# Patient Record
Sex: Female | Born: 1960 | Race: White | Hispanic: No | Marital: Married | Smoking: Current every day smoker
Health system: Southern US, Community
[De-identification: ages and names within clinical notes are randomized; demographics above are authoritative.]

## PROBLEM LIST (undated history)

## (undated) DIAGNOSIS — F419 Anxiety disorder, unspecified: Secondary | ICD-10-CM

## (undated) DIAGNOSIS — I10 Essential (primary) hypertension: Secondary | ICD-10-CM

## (undated) DIAGNOSIS — F32A Depression, unspecified: Secondary | ICD-10-CM

## (undated) DIAGNOSIS — J189 Pneumonia, unspecified organism: Secondary | ICD-10-CM

## (undated) DIAGNOSIS — I219 Acute myocardial infarction, unspecified: Secondary | ICD-10-CM

## (undated) DIAGNOSIS — Z8719 Personal history of other diseases of the digestive system: Secondary | ICD-10-CM

## (undated) DIAGNOSIS — M1990 Unspecified osteoarthritis, unspecified site: Secondary | ICD-10-CM

## (undated) DIAGNOSIS — T7840XA Allergy, unspecified, initial encounter: Secondary | ICD-10-CM

## (undated) DIAGNOSIS — J45909 Unspecified asthma, uncomplicated: Secondary | ICD-10-CM

## (undated) DIAGNOSIS — Z87442 Personal history of urinary calculi: Secondary | ICD-10-CM

## (undated) DIAGNOSIS — Z8489 Family history of other specified conditions: Secondary | ICD-10-CM

## (undated) DIAGNOSIS — F329 Major depressive disorder, single episode, unspecified: Secondary | ICD-10-CM

## (undated) DIAGNOSIS — D649 Anemia, unspecified: Secondary | ICD-10-CM

## (undated) DIAGNOSIS — G459 Transient cerebral ischemic attack, unspecified: Secondary | ICD-10-CM

## (undated) DIAGNOSIS — K219 Gastro-esophageal reflux disease without esophagitis: Secondary | ICD-10-CM

## (undated) DIAGNOSIS — I7090 Unspecified atherosclerosis: Secondary | ICD-10-CM

## (undated) HISTORY — PX: HAMMER TOE SURGERY: SHX385

## (undated) HISTORY — DX: Major depressive disorder, single episode, unspecified: F32.9

## (undated) HISTORY — PX: CHOLECYSTECTOMY: SHX55

## (undated) HISTORY — PX: CERVICAL SPINE SURGERY: SHX589

## (undated) HISTORY — DX: Unspecified asthma, uncomplicated: J45.909

## (undated) HISTORY — PX: ABDOMINAL HYSTERECTOMY: SHX81

## (undated) HISTORY — DX: Gastro-esophageal reflux disease without esophagitis: K21.9

## (undated) HISTORY — DX: Anxiety disorder, unspecified: F41.9

## (undated) HISTORY — PX: OTHER SURGICAL HISTORY: SHX169

## (undated) HISTORY — DX: Depression, unspecified: F32.A

## (undated) HISTORY — DX: Essential (primary) hypertension: I10

## (undated) HISTORY — PX: CARPAL TUNNEL RELEASE: SHX101

## (undated) HISTORY — DX: Transient cerebral ischemic attack, unspecified: G45.9

## (undated) HISTORY — DX: Unspecified osteoarthritis, unspecified site: M19.90

## (undated) HISTORY — PX: APPENDECTOMY: SHX54

## (undated) HISTORY — PX: JOINT REPLACEMENT: SHX530

## (undated) HISTORY — DX: Allergy, unspecified, initial encounter: T78.40XA

---

## 2010-04-17 DIAGNOSIS — Z8249 Family history of ischemic heart disease and other diseases of the circulatory system: Secondary | ICD-10-CM | POA: Insufficient documentation

## 2010-04-17 DIAGNOSIS — Z823 Family history of stroke: Secondary | ICD-10-CM | POA: Insufficient documentation

## 2010-04-17 DIAGNOSIS — Z800 Family history of malignant neoplasm of digestive organs: Secondary | ICD-10-CM | POA: Insufficient documentation

## 2010-07-21 DIAGNOSIS — G459 Transient cerebral ischemic attack, unspecified: Secondary | ICD-10-CM

## 2010-07-21 HISTORY — DX: Transient cerebral ischemic attack, unspecified: G45.9

## 2010-08-16 DIAGNOSIS — E538 Deficiency of other specified B group vitamins: Secondary | ICD-10-CM | POA: Insufficient documentation

## 2011-04-03 DIAGNOSIS — F331 Major depressive disorder, recurrent, moderate: Secondary | ICD-10-CM | POA: Insufficient documentation

## 2011-08-18 DIAGNOSIS — I10 Essential (primary) hypertension: Secondary | ICD-10-CM | POA: Insufficient documentation

## 2011-08-18 DIAGNOSIS — F17200 Nicotine dependence, unspecified, uncomplicated: Secondary | ICD-10-CM | POA: Insufficient documentation

## 2013-03-22 DIAGNOSIS — G4700 Insomnia, unspecified: Secondary | ICD-10-CM | POA: Insufficient documentation

## 2013-04-20 DIAGNOSIS — M5416 Radiculopathy, lumbar region: Secondary | ICD-10-CM | POA: Insufficient documentation

## 2013-07-28 DIAGNOSIS — R911 Solitary pulmonary nodule: Secondary | ICD-10-CM | POA: Insufficient documentation

## 2013-07-28 DIAGNOSIS — J45909 Unspecified asthma, uncomplicated: Secondary | ICD-10-CM | POA: Insufficient documentation

## 2013-07-28 DIAGNOSIS — E041 Nontoxic single thyroid nodule: Secondary | ICD-10-CM | POA: Insufficient documentation

## 2014-04-03 DIAGNOSIS — Z9889 Other specified postprocedural states: Secondary | ICD-10-CM | POA: Insufficient documentation

## 2014-04-03 DIAGNOSIS — N3941 Urge incontinence: Secondary | ICD-10-CM | POA: Insufficient documentation

## 2014-04-03 DIAGNOSIS — Z90710 Acquired absence of both cervix and uterus: Secondary | ICD-10-CM | POA: Insufficient documentation

## 2014-04-05 DIAGNOSIS — R7301 Impaired fasting glucose: Secondary | ICD-10-CM | POA: Insufficient documentation

## 2014-04-25 DIAGNOSIS — N200 Calculus of kidney: Secondary | ICD-10-CM | POA: Insufficient documentation

## 2014-07-26 DIAGNOSIS — H6983 Other specified disorders of Eustachian tube, bilateral: Secondary | ICD-10-CM | POA: Insufficient documentation

## 2015-10-24 DIAGNOSIS — G894 Chronic pain syndrome: Secondary | ICD-10-CM | POA: Insufficient documentation

## 2016-07-28 DIAGNOSIS — Z6841 Body Mass Index (BMI) 40.0 and over, adult: Secondary | ICD-10-CM

## 2016-08-18 DIAGNOSIS — M50122 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 level with radiculopathy: Secondary | ICD-10-CM | POA: Insufficient documentation

## 2016-11-17 DIAGNOSIS — I872 Venous insufficiency (chronic) (peripheral): Secondary | ICD-10-CM | POA: Insufficient documentation

## 2018-07-01 ENCOUNTER — Encounter: Payer: Self-pay | Admitting: Family Medicine

## 2018-07-01 ENCOUNTER — Ambulatory Visit: Payer: 59 | Admitting: Family Medicine

## 2018-07-01 VITALS — BP 117/72 | HR 69 | Temp 96.9°F | Ht 66.0 in | Wt 233.0 lb

## 2018-07-01 DIAGNOSIS — J359 Chronic disease of tonsils and adenoids, unspecified: Secondary | ICD-10-CM | POA: Diagnosis not present

## 2018-07-01 DIAGNOSIS — Z7689 Persons encountering health services in other specified circumstances: Secondary | ICD-10-CM

## 2018-07-01 DIAGNOSIS — E041 Nontoxic single thyroid nodule: Secondary | ICD-10-CM | POA: Diagnosis not present

## 2018-07-01 DIAGNOSIS — M51379 Other intervertebral disc degeneration, lumbosacral region without mention of lumbar back pain or lower extremity pain: Secondary | ICD-10-CM | POA: Insufficient documentation

## 2018-07-01 DIAGNOSIS — Z23 Encounter for immunization: Secondary | ICD-10-CM | POA: Diagnosis not present

## 2018-07-01 DIAGNOSIS — F17200 Nicotine dependence, unspecified, uncomplicated: Secondary | ICD-10-CM

## 2018-07-01 DIAGNOSIS — I10 Essential (primary) hypertension: Secondary | ICD-10-CM | POA: Diagnosis not present

## 2018-07-01 DIAGNOSIS — M5137 Other intervertebral disc degeneration, lumbosacral region: Secondary | ICD-10-CM | POA: Insufficient documentation

## 2018-07-01 DIAGNOSIS — M549 Dorsalgia, unspecified: Secondary | ICD-10-CM | POA: Insufficient documentation

## 2018-07-01 DIAGNOSIS — Z1239 Encounter for other screening for malignant neoplasm of breast: Secondary | ICD-10-CM

## 2018-07-01 MED ORDER — ALBUTEROL SULFATE HFA 108 (90 BASE) MCG/ACT IN AERS: 2.0000 | INHALATION_SPRAY | Freq: Four times a day (QID) | RESPIRATORY_TRACT | 0 refills | Status: DC | PRN

## 2018-07-01 MED ORDER — METOPROLOL SUCCINATE ER 25 MG PO TB24: 25.0000 mg | ORAL_TABLET | Freq: Every day | ORAL | 3 refills | Status: DC

## 2018-07-01 MED ORDER — AMLODIPINE BESYLATE 5 MG PO TABS: 5.0000 mg | ORAL_TABLET | Freq: Every day | ORAL | 3 refills | Status: DC

## 2018-07-01 NOTE — Progress Notes (Signed)
Subjective: ZO:[id number] care, tonsil concern HPI: Sydney [name] is a 57 y.o. female presenting to clinic today for:  1. Tonsil concern Patient reports that she noticed a spot on her right tonsil in September.  There was a smaller spot next to as well.  She denies any sore throat, fevers, chills, unplanned weight loss, lymph node enlargement.  She is an active every day smoker and has been a smoker of 1/2 pack/day for about 15 years total.  She was successfully in remission from smoking up until her father passed and her daughter got sick with cancer.  2.  Hypertension Longstanding history of hypertension.  She reports compliance with Norvasc and Toprol.  She needs refills on both of these medications.  No chest pain, shortness of breath, lower extreme edema, dizziness or visual disturbance.  She is a smoker as above.  3.  Preventative care Patient with history of total hysterectomy in her 30s.  She has been undergoing close surveillance with every 2-year mammograms but was too ill to have her repeat mammogram done last year.  She would like to go to [place] to have this done.  Past Medical History:  Diagnosis Date  . Allergy   . Anxiety   . Arthritis   . Asthma   . Depression   . GERD (gastroesophageal reflux disease)   . Hypertension   . Stroke [facility]) 2012   TIA   History reviewed. No pertinent surgical history. Social History   Socioeconomic History  . Marital status: Married    Spouse name: Not on file  . Number of children: 3  . Years of education: Not on file  . Highest education level: Not on file  Occupational History  . Not on file  Social Needs  . Financial resource strain: Not on file  . Food insecurity:    Worry: Not on file    Inability: Not on file  . Transportation needs:    Medical: Not on file    Non-medical: Not on file  Tobacco Use  . Smoking status: Current Every Day Smoker    Packs/day: 0.50    Years: 15.00    Pack years: 7.50    Types:  Cigarettes  . Smokeless tobacco: Never Used  Substance and Sexual Activity  . Alcohol use: Not on file    Comment: occ  . Drug use: Never  . Sexual activity: Not on file  Lifestyle  . Physical activity:    Days per week: Not on file    Minutes per session: Not on file  . Stress: Not on file  Relationships  . Social connections:    Talks on phone: Not on file    Gets together: Not on file    Attends religious service: Not on file    Active member of club or organization: Not on file    Attends meetings of clubs or organizations: Not on file    Relationship status: Not on file  . Intimate partner violence:    Fear of current or ex partner: Not on file    Emotionally abused: Not on file    Physically abused: Not on file    Forced sexual activity: Not on file  Other Topics Concern  . Not on file  Social History Narrative   Recently relocated to [place] from [place].  She resides at home with her husband.   She has 3 children but 1 passed away from cancer.   Current Meds  Medication Sig  .  albuterol (PROAIR HFA) 108 (90 Base) MCG/ACT inhaler Inhale 2 puffs into the lungs every 6 (six) hours as needed for wheezing or shortness of breath.  Marland Kitchen. amLODipine (NORVASC) 5 MG tablet Take 1 tablet (5 mg total) by mouth daily.  . Diclofenac Sodium (PENNSAID) 2 % SOLN APPLY 2 PUMPS (2 GRAMS) TO AFFECTED AREA TOPICALLY TWICE DAILY AS DIRECTED  . fexofenadine (ALLEGRA) 60 MG tablet Take by mouth.  Marland Kitchen. HYDROcodone-acetaminophen (NORCO) 5-325 MG tablet Norco 5 mg-325 mg tablet  Take 1 tablet twice a day by oral route.  . metoprolol succinate (TOPROL-XL) 25 MG 24 hr tablet Take 1 tablet (25 mg total) by mouth daily.  . sertraline (ZOLOFT) 100 MG tablet sertraline 100 mg tablet  . traZODone (DESYREL) 100 MG tablet trazodone 100 mg tablet  . vitamin B-12 (CYANOCOBALAMIN) 1000 MCG tablet Take by mouth.  . [DISCONTINUED] albuterol (PROAIR HFA) 108 (90 Base) MCG/ACT inhaler ProAir HFA 90  mcg/actuation aerosol inhaler  TAKE 2 PUFFS BY MOUTH EVERY 6 HOURS AS NEEDED FOR WHEEZE  . [DISCONTINUED] amLODipine (NORVASC) 5 MG tablet amlodipine 5 mg tablet  TAKE 1 TABLET BY MOUTH EVERY DAY IN THE MORNING  . [DISCONTINUED] metoprolol succinate (TOPROL-XL) 25 MG 24 hr tablet metoprolol succinate ER 25 mg tablet,extended release 24 hr  TAKE 1 TABLET BY MOUTH EVERY DAY   Family History  Problem Relation Age of Onset  . Anxiety disorder Mother   . Depression Mother   . Heart disease Mother   . Hypertension Mother   . Arrhythmia Mother   . Cancer Father   . Lung cancer Father   . Migraines Sister   . Alcohol abuse Brother   . Cancer Daughter        nerve sheath sarcoma   Allergies  Allergen Reactions  . Bee Pollen Anaphylaxis and Swelling  . Butorphanol Other (See Comments)    Not sure told by md she was allergic after surgery.   . Meloxicam Anxiety    Other reaction(s): Other (See Comments) Mood disorder Altered her personality   . Penicillins Anaphylaxis and Hives    Other reaction(s): Unable to Recall As child mom was told she is highly allergic   . Prochlorperazine Edisylate Anaphylaxis  . Sulfa Antibiotics Anaphylaxis    Other reaction(s): Unable to Recall  . Tramadol Anxiety    Didn't like the way it made her feel   . Topiramate Nausea And Vomiting     Health Maintenance: Flu shot, mammo. ROS: Per HPI  Objective: Office vital signs reviewed. BP 117/72   Pulse 69   Temp (!) 96.9 F (36.1 C) (Oral)   Ht 5\' 6"  (1.676 m)   Wt 233 lb (105.7 kg)   BMI 37.61 kg/m   Physical Examination:  General: Awake, alert, well nourished, No acute distress HEENT: Normal    Neck: No masses palpated. No lymphadenopathy    Ears: Tympanic membranes intact, normal light reflex, no erythema, no bulging    Eyes: PERRLA, extraocular movement in tact, sclera white    Nose: nasal turbinates moist, no nasal discharge    Throat: moist mucus membranes, no erythema, RIGHT  tonsil w/ 1-2 millimeter yellow, mucinous appearing mass.  There is a smaller adjacent mass of similar color and texture.  Airway is patent Cardio: regular rate and rhythm, S1S2 heard, no murmurs appreciated Pulm: clear to auscultation bilaterally, no wheezes, rhonchi or rales; normal work of breathing on room air  Assessment/ Plan: 57 y.o. female    1.  Lesion of tonsil Given several month history of tonsillar lesion and current smoking status, I have placed a referral to ear nose and throat for further evaluation.  On exam it does not appear to be infectious and has a mucinous appearance.   - Ambulatory referral to ENT  2. Essential hypertension Controlled.  Refills sent to pharmacy.  3. Establishing care with new doctor, encounter for Records reviewed.  Plan for physical w/ fasting labs in 09/2018  4. Nontoxic uninodular goiter Hx biopsy that was normal per patient  5. Screening for breast cancer Wants at [facility] - MM Digital Screening; Future  6. Tobacco use disorder Counseling performed.  Contemplative but not ready for cessation.   Meds ordered this encounter  Medications  . albuterol (PROAIR HFA) 108 (90 Base) MCG/ACT inhaler    Sig: Inhale 2 puffs into the lungs every 6 (six) hours as needed for wheezing or shortness of breath.    Dispense:  3 Inhaler    Refill:  0  . amLODipine (NORVASC) 5 MG tablet    Sig: Take 1 tablet (5 mg total) by mouth daily.    Dispense:  90 tablet    Refill:  3  . metoprolol succinate (TOPROL-XL) 25 MG 24 hr tablet    Sig: Take 1 tablet (25 mg total) by mouth daily.    Dispense:  90 tablet    Refill:  3   Orders Placed This Encounter  Procedures  . MM Digital Screening    Standing Status:   Future    Standing Expiration Date:   09/02/2019    Order Specific Question:   Reason for Exam (SYMPTOM  OR DIAGNOSIS REQUIRED)    Answer:   screening for breast cancer    Order Specific Question:   Is the patient pregnant?    Answer:   No    Order  Specific Question:   Preferred imaging location?    Answer:   [facility] [place]  . Ambulatory referral to ENT    Referral Priority:   Routine    Referral Type:   Consultation    Referral Reason:   Specialty Services Required    Requested Specialty:   Otolaryngology    Number of Visits Requested:   1    Bueford Arp Hulen Skains, DO [facility] [phone]

## 2018-08-02 DIAGNOSIS — Z029 Encounter for administrative examinations, unspecified: Secondary | ICD-10-CM

## 2018-08-04 ENCOUNTER — Other Ambulatory Visit: Payer: Self-pay | Admitting: Family Medicine

## 2018-08-04 DIAGNOSIS — Z6841 Body Mass Index (BMI) 40.0 and over, adult: Principal | ICD-10-CM

## 2018-08-04 DIAGNOSIS — R7301 Impaired fasting glucose: Secondary | ICD-10-CM

## 2018-08-04 NOTE — Progress Notes (Signed)
I was completing forms for patient for interactive health screening form.  She requires a blood sugar for completion.  I informed her this via telephone and she will come in tomorrow to have this checked.  Order has been placed.  Will give form to [place].  Ashly M. Nadine Counts, DO [facility]   Orders Placed This Encounter  Procedures  . Glucose Hemocue Waived

## 2018-08-05 ENCOUNTER — Ambulatory Visit (INDEPENDENT_AMBULATORY_CARE_PROVIDER_SITE_OTHER): Payer: 59 | Admitting: Otolaryngology

## 2018-08-05 DIAGNOSIS — J351 Hypertrophy of tonsils: Secondary | ICD-10-CM

## 2018-10-01 ENCOUNTER — Other Ambulatory Visit: Payer: Self-pay

## 2018-10-01 ENCOUNTER — Ambulatory Visit (INDEPENDENT_AMBULATORY_CARE_PROVIDER_SITE_OTHER): Payer: 59 | Admitting: Family Medicine

## 2018-10-01 ENCOUNTER — Encounter: Payer: Self-pay | Admitting: Family Medicine

## 2018-10-01 VITALS — BP 125/78 | HR 67 | Temp 98.7°F | Ht 66.0 in | Wt 227.0 lb

## 2018-10-01 DIAGNOSIS — Z114 Encounter for screening for human immunodeficiency virus [HIV]: Secondary | ICD-10-CM

## 2018-10-01 DIAGNOSIS — Z0001 Encounter for general adult medical examination with abnormal findings: Secondary | ICD-10-CM

## 2018-10-01 DIAGNOSIS — E041 Nontoxic single thyroid nodule: Secondary | ICD-10-CM

## 2018-10-01 DIAGNOSIS — Z0000 Encounter for general adult medical examination without abnormal findings: Secondary | ICD-10-CM

## 2018-10-01 DIAGNOSIS — Z1159 Encounter for screening for other viral diseases: Secondary | ICD-10-CM

## 2018-10-01 DIAGNOSIS — Z6841 Body Mass Index (BMI) 40.0 and over, adult: Secondary | ICD-10-CM

## 2018-10-01 DIAGNOSIS — I10 Essential (primary) hypertension: Secondary | ICD-10-CM | POA: Diagnosis not present

## 2018-10-01 DIAGNOSIS — Z130 Encounter for screening for diseases of the blood and blood-forming organs and certain disorders involving the immune mechanism: Secondary | ICD-10-CM

## 2018-10-01 DIAGNOSIS — R7301 Impaired fasting glucose: Secondary | ICD-10-CM | POA: Diagnosis not present

## 2018-10-01 DIAGNOSIS — L819 Disorder of pigmentation, unspecified: Secondary | ICD-10-CM

## 2018-10-01 LAB — BAYER DCA HB A1C WAIVED: HB A1C (BAYER DCA - WAIVED): 5.5 % (ref <7.0)

## 2018-10-01 NOTE — Progress Notes (Signed)
Sydney [name] is a 58 y.o. female presents to office today for annual physical exam examination.    Concerns today include: 1.  None.  She notes that she is doing well Marital status: Married, Substance use: Daily smoker Diet: Fair, Exercise: No structured secondary to orthopedic issues Last colonoscopy: 4 years ago.  She is due next year.  She gets these every 5 years due to family history of colon cancer.  She has had benign polyps on previous colonoscopy. Last mammogram: Needs.  Was not contacted by Linna Hoff for mammogram Last pap smear: History of total hysterectomy.  No vaginal complaints today Refills needed today: None needed Immunizations needed: UTD  Past Medical History:  Diagnosis Date  . Allergy   . Anxiety   . Arthritis   . Asthma   . Depression   . GERD (gastroesophageal reflux disease)   . Hypertension   . TIA (transient ischemic attack) 2012   Social History   Socioeconomic History  . Marital status: Married    Spouse name: Not on file  . Number of children: 3  . Years of education: Not on file  . Highest education level: Not on file  Occupational History  . Not on file  Social Needs  . Financial resource strain: Not on file  . Food insecurity:    Worry: Not on file    Inability: Not on file  . Transportation needs:    Medical: Not on file    Non-medical: Not on file  Tobacco Use  . Smoking status: Current Every Day Smoker    Packs/day: 0.50    Years: 15.00    Pack years: 7.50    Types: Cigarettes  . Smokeless tobacco: Never Used  Substance and Sexual Activity  . Alcohol use: Yes    Comment: occ  . Drug use: Never  . Sexual activity: Not Currently  Lifestyle  . Physical activity:    Days per week: Not on file    Minutes per session: Not on file  . Stress: Not on file  Relationships  . Social connections:    Talks on phone: Not on file    Gets together: Not on file    Attends religious service: Not on file    Active member of club  or organization: Not on file    Attends meetings of clubs or organizations: Not on file    Relationship status: Not on file  . Intimate partner violence:    Fear of current or ex partner: Not on file    Emotionally abused: Not on file    Physically abused: Not on file    Forced sexual activity: Not on file  Other Topics Concern  . Not on file  Social History Narrative   Recently relocated to [place] from [place].  She resides at home with her husband.   She has 3 children but 1 passed away from cancer.   No past surgical history on file. Family History  Problem Relation Age of Onset  . Anxiety disorder Mother   . Depression Mother   . Heart disease Mother   . Hypertension Mother   . Arrhythmia Mother   . Cancer Father   . Lung cancer Father   . Migraines Sister   . Alcohol abuse Brother   . Cancer Daughter        nerve sheath sarcoma    Current Outpatient Medications:  .  albuterol (PROAIR HFA) 108 (90 Base) MCG/ACT inhaler, Inhale 2 puffs  into the lungs every 6 (six) hours as needed for wheezing or shortness of breath., Disp: 3 Inhaler, Rfl: 0 .  amLODipine (NORVASC) 5 MG tablet, Take 1 tablet (5 mg total) by mouth daily., Disp: 90 tablet, Rfl: 3 .  fexofenadine (ALLEGRA) 60 MG tablet, Take by mouth., Disp: , Rfl:  .  HYDROcodone-acetaminophen (NORCO) 5-325 MG tablet, Norco 5 mg-325 mg tablet  Take 1 tablet twice a day by oral route., Disp: , Rfl:  .  metoprolol succinate (TOPROL-XL) 25 MG 24 hr tablet, Take 1 tablet (25 mg total) by mouth daily., Disp: 90 tablet, Rfl: 3 .  sertraline (ZOLOFT) 100 MG tablet, sertraline 100 mg tablet, Disp: , Rfl:  .  traZODone (DESYREL) 100 MG tablet, trazodone 100 mg tablet, Disp: , Rfl:  .  vitamin B-12 (CYANOCOBALAMIN) 1000 MCG tablet, Take by mouth., Disp: , Rfl:   Allergies  Allergen Reactions  . Bee Pollen Anaphylaxis and Swelling  . Butorphanol Other (See Comments)    Not sure told by md she was allergic after surgery.   .  Meloxicam Anxiety    Other reaction(s): Other (See Comments) Mood disorder Altered her personality   . Penicillins Anaphylaxis and Hives    Other reaction(s): Unable to Recall As child mom was told she is highly allergic   . Prochlorperazine Edisylate Anaphylaxis  . Sulfa Antibiotics Anaphylaxis    Other reaction(s): Unable to Recall  . Tramadol Anxiety    Didn't like the way it made her feel   . Topiramate Nausea And Vomiting     ROS: Review of Systems Constitutional: negative Eyes: positive for contacts/glasses Ears, nose, mouth, throat, and face: negative Respiratory: negative Cardiovascular: negative Gastrointestinal: negative Genitourinary:negative Integument/breast: negative Hematologic/lymphatic: negative Musculoskeletal:positive for back pain Neurological: negative Behavioral/Psych: negative Endocrine: negative Allergic/Immunologic: negative    Physical exam BP 125/78   Pulse 67   Temp 98.7 F (37.1 C) (Oral)   Ht _0  (1.676 m)   Wt 227 lb (103 kg)   BMI 36.64 kg/m  General appearance: alert, cooperative, appears stated age, no distress and moderately obese Head: Normocephalic, without obvious abnormality, atraumatic Eyes: negative findings: lids and lashes normal, conjunctivae and sclerae normal, corneas clear and pupils equal, round, reactive to light and accomodation Ears: normal TM's and external ear canals both ears Nose: Nares normal. Septum midline. Mucosa normal. No drainage or sinus tenderness. Throat: dentition fair. MMM, no oral lesions noted Neck: no adenopathy, supple, symmetrical, trachea midline and thyroid not enlarged, symmetric, no tenderness/mass/nodules Back: symmetric, no curvature. ROM normal. No CVA tenderness. Lungs: clear to auscultation bilaterally Heart: regular rate and rhythm, S1, S2 normal, no murmur, click, rub or gallop Abdomen: soft, non-tender; bowel sounds normal; no masses,  no organomegaly Extremities: extremities  normal, atraumatic, no cyanosis or edema Pulses: 2+ and symmetric Skin: Multiple pigmented nevi.  She has 2 nevi of concern along bilateral anterior shins.  One is somewhat cystic in nature, on the left lower extremity.  One is highly pigmented on the right lower extremity.She has a well healed horizontal surgical scar along the right anterior neck. Multiple tattoos. Lymph nodes: Cervical, supraclavicular, and axillary nodes normal. Neurologic: Alert and oriented X 3, normal strength and tone. Normal symmetric reflexes. Normal coordination and gait Psych: Mood stable, speech normal, affect appropriate, pleasant and interactive. Depression screen [facility] 2/9 10/01/2018 07/01/2018  Decreased Interest 0 0  Down, Depressed, Hopeless 0 0  PHQ - 2 Score 0 0  Altered sleeping 0 0  Tired,  decreased energy 0 0  Change in appetite 0 1  Feeling bad or failure about yourself  0 1  Trouble concentrating 0 0  Moving slowly or fidgety/restless 0 1  Suicidal thoughts 0 0  PHQ-9 Score 0 3  Difficult doing work/chores - Not difficult at all   Assessment/ Plan: Sydney [name] here for annual physical exam.   1. Annual physical exam I will reach out to our scheduler with regards to her mammogram, which was ordered in December.  Colonoscopy results were found in the EMR and have been printed for scanning.  Not due until October of this year.  2. Essential hypertension Controlled.  No changes made.  No refills needed.  Check fasting lipid panel and metabolic panel - [facility] - Lipid Panel  3. Impaired fasting glucose Noted previously.  Check A1c - Bayer DCA Hb A1c Waived  4. Morbid obesity with BMI of 40.0-44.9, adult ([place]) Working on diet modification.  Unfortunately, she is limited physically secondary to chronic back pain.  5. Nontoxic uninodular goiter Asymptomatic.  Check TSH - TSH  6. Screening, anemia, deficiency, iron - CBC  7. Screening for HIV (human immunodeficiency virus) - HIV  antibody (with reflex)  8. Encounter for hepatitis C screening test for low risk patient  - Hepatitis C antibody  9. Pigmented skin lesion of uncertain nature Specifically right anterior shin with a highly pigmented skin lesion.  Given her time in the sun, I think she warrants full body exam.  Referral to dermatology placed. - Ambulatory referral to Dermatology   Counseled on healthy lifestyle choices, including diet (rich in fruits, vegetables and lean meats and low in salt and simple carbohydrates) and exercise (at least 30 minutes of moderate physical activity daily).  Patient to follow up in 1 year for annual exam or sooner if needed.  Zariah Jost M. Lajuana Ripple, DO

## 2018-10-01 NOTE — Patient Instructions (Signed)
I will see if Sydney [name] can reach out to the mammogram center in [place].  Health Maintenance, Female Adopting a healthy lifestyle and getting preventive care can go a long way to promote health and wellness. Talk with your health care provider about what schedule of regular examinations is right for you. This is a good chance for you to check in with your provider about disease prevention and staying healthy. In between checkups, there are plenty of things you can do on your own. Experts have done a lot of research about which lifestyle changes and preventive measures are most likely to keep you healthy. Ask your health care provider for more information. Weight and diet Eat a healthy diet  Be sure to include plenty of vegetables, fruits, low-fat dairy products, and lean protein.  Do not eat a lot of foods high in solid fats, added sugars, or salt.  Get regular exercise. This is one of the most important things you can do for your health. ? Most adults should exercise for at least 150 minutes each week. The exercise should increase your heart rate and make you sweat (moderate-intensity exercise). ? Most adults should also do strengthening exercises at least twice a week. This is in addition to the moderate-intensity exercise. Maintain a healthy weight  Body mass index (BMI) is a measurement that can be used to identify possible weight problems. It estimates body fat based on height and weight. Your health care provider can help determine your BMI and help you achieve or maintain a healthy weight.  For females 59 years of age and older: ? A BMI below 18.5 is considered underweight. ? A BMI of 18.5 to 24.9 is normal. ? A BMI of 25 to 29.9 is considered overweight. ? A BMI of 30 and above is considered obese. Watch levels of cholesterol and blood lipids  You should start having your blood tested for lipids and cholesterol at 59 years of age, then have this test every 5 years.  You may need  to have your cholesterol levels checked more often if: ? Your lipid or cholesterol levels are high. ? You are older than 59 years of age. ? You are at high risk for heart disease. Cancer screening Lung Cancer  Lung cancer screening is recommended for adults 29-30 years old who are at high risk for lung cancer because of a history of smoking.  A yearly low-dose CT scan of the lungs is recommended for people who: ? Currently smoke. ? Have quit within the past 15 years. ? Have at least a 30-pack-year history of smoking. A pack year is smoking an average of one pack of cigarettes a day for 1 year.  Yearly screening should continue until it has been 15 years since you quit.  Yearly screening should stop if you develop a health problem that would prevent you from having lung cancer treatment. Breast Cancer  Practice breast self-awareness. This means understanding how your breasts normally appear and feel.  It also means doing regular breast self-exams. Let your health care provider know about any changes, no matter how small.  If you are in your 20s or 30s, you should have a clinical breast exam (CBE) by a health care provider every 1-3 years as part of a regular health exam.  If you are 62 or older, have a CBE every year. Also consider having a breast X-ray (mammogram) every year.  If you have a family history of breast cancer, talk to your health care  provider about genetic screening.  If you are at high risk for breast cancer, talk to your health care provider about having an MRI and a mammogram every year.  Breast cancer gene (BRCA) assessment is recommended for women who have family members with BRCA-related cancers. BRCA-related cancers include: ? Breast. ? Ovarian. ? Tubal. ? Peritoneal cancers.  Results of the assessment will determine the need for genetic counseling and BRCA1 and BRCA2 testing. Cervical Cancer Your health care provider may recommend that you be screened  regularly for cancer of the pelvic organs (ovaries, uterus, and vagina). This screening involves a pelvic examination, including checking for microscopic changes to the surface of your cervix (Pap test). You may be encouraged to have this screening done every 3 years, beginning at age 94.  For women ages 60-65, health care providers may recommend pelvic exams and Pap testing every 3 years, or they may recommend the Pap and pelvic exam, combined with testing for human papilloma virus (HPV), every 5 years. Some types of HPV increase your risk of cervical cancer. Testing for HPV may also be done on women of any age with unclear Pap test results.  Other health care providers may not recommend any screening for nonpregnant women who are considered low risk for pelvic cancer and who do not have symptoms. Ask your health care provider if a screening pelvic exam is right for you.  If you have had past treatment for cervical cancer or a condition that could lead to cancer, you need Pap tests and screening for cancer for at least 20 years after your treatment. If Pap tests have been discontinued, your risk factors (such as having a new sexual partner) need to be reassessed to determine if screening should resume. Some women have medical problems that increase the chance of getting cervical cancer. In these cases, your health care provider may recommend more frequent screening and Pap tests. Colorectal Cancer  This type of cancer can be detected and often prevented.  Routine colorectal cancer screening usually begins at 59 years of age and continues through 59 years of age.  Your health care provider may recommend screening at an earlier age if you have risk factors for colon cancer.  Your health care provider may also recommend using home test kits to check for hidden blood in the stool.  A small camera at the end of a tube can be used to examine your colon directly (sigmoidoscopy or colonoscopy). This is  done to check for the earliest forms of colorectal cancer.  Routine screening usually begins at age 71.  Direct examination of the colon should be repeated every 5-10 years through 59 years of age. However, you may need to be screened more often if early forms of precancerous polyps or small growths are found. Skin Cancer  Check your skin from head to toe regularly.  Tell your health care provider about any new moles or changes in moles, especially if there is a change in a mole's shape or color.  Also tell your health care provider if you have a mole that is larger than the size of a pencil eraser.  Always use sunscreen. Apply sunscreen liberally and repeatedly throughout the day.  Protect yourself by wearing long sleeves, pants, a wide-brimmed hat, and sunglasses whenever you are outside. Heart disease, diabetes, and high blood pressure  High blood pressure causes heart disease and increases the risk of stroke. High blood pressure is more likely to develop in: ? People who have  blood pressure in the high end of the normal range (130-139/85-89 mm Hg). ? People who are overweight or obese. ? People who are African American.  If you are 18-39 years of age, have your blood pressure checked every 3-5 years. If you are 40 years of age or older, have your blood pressure checked every year. You should have your blood pressure measured twice-once when you are at a hospital or clinic, and once when you are not at a hospital or clinic. Record the average of the two measurements. To check your blood pressure when you are not at a hospital or clinic, you can use: ? An automated blood pressure machine at a pharmacy. ? A home blood pressure monitor.  If you are between 55 years and 79 years old, ask your health care provider if you should take aspirin to prevent strokes.  Have regular diabetes screenings. This involves taking a blood sample to check your fasting blood sugar level. ? If you are at a  normal weight and have a low risk for diabetes, have this test once every three years after 59 years of age. ? If you are overweight and have a high risk for diabetes, consider being tested at a younger age or more often. Preventing infection Hepatitis B  If you have a higher risk for hepatitis B, you should be screened for this virus. You are considered at high risk for hepatitis B if: ? You were born in a country where hepatitis B is common. Ask your health care provider which countries are considered high risk. ? Your parents were born in a high-risk country, and you have not been immunized against hepatitis B (hepatitis B vaccine). ? You have HIV or AIDS. ? You use needles to inject street drugs. ? You live with someone who has hepatitis B. ? You have had sex with someone who has hepatitis B. ? You get hemodialysis treatment. ? You take certain medicines for conditions, including cancer, organ transplantation, and autoimmune conditions. Hepatitis C  Blood testing is recommended for: ? Everyone born from 1945 through 1965. ? Anyone with known risk factors for hepatitis C. Sexually transmitted infections (STIs)  You should be screened for sexually transmitted infections (STIs) including gonorrhea and chlamydia if: ? You are sexually active and are younger than 59 years of age. ? You are older than 59 years of age and your health care provider tells you that you are at risk for this type of infection. ? Your sexual activity has changed since you were last screened and you are at an increased risk for chlamydia or gonorrhea. Ask your health care provider if you are at risk.  If you do not have HIV, but are at risk, it may be recommended that you take a prescription medicine daily to prevent HIV infection. This is called pre-exposure prophylaxis (PrEP). You are considered at risk if: ? You are sexually active and do not regularly use condoms or know the HIV status of your partner(s). ? You  take drugs by injection. ? You are sexually active with a partner who has HIV. Talk with your health care provider about whether you are at high risk of being infected with HIV. If you choose to begin PrEP, you should first be tested for HIV. You should then be tested every 3 months for as long as you are taking PrEP. Pregnancy  If you are premenopausal and you may become pregnant, ask your health care provider about preconception counseling.    If you may become pregnant, take 400 to 800 micrograms (mcg) of folic acid every day.  If you want to prevent pregnancy, talk to your health care provider about birth control (contraception). Osteoporosis and menopause  Osteoporosis is a disease in which the bones lose minerals and strength with aging. This can result in serious bone fractures. Your risk for osteoporosis can be identified using a bone density scan.  If you are 65 years of age or older, or if you are at risk for osteoporosis and fractures, ask your health care provider if you should be screened.  Ask your health care provider whether you should take a calcium or vitamin D supplement to lower your risk for osteoporosis.  Menopause may have certain physical symptoms and risks.  Hormone replacement therapy may reduce some of these symptoms and risks. Talk to your health care provider about whether hormone replacement therapy is right for you. Follow these instructions at home:  Schedule regular health, dental, and eye exams.  Stay current with your immunizations.  Do not use any tobacco products including cigarettes, chewing tobacco, or electronic cigarettes.  If you are pregnant, do not drink alcohol.  If you are breastfeeding, limit how much and how often you drink alcohol.  Limit alcohol intake to no more than 1 drink per day for nonpregnant women. One drink equals 12 ounces of beer, 5 ounces of wine, or 1 ounces of hard liquor.  Do not use street drugs.  Do not share  needles.  Ask your health care provider for help if you need support or information about quitting drugs.  Tell your health care provider if you often feel depressed.  Tell your health care provider if you have ever been abused or do not feel safe at home. This information is not intended to replace advice given to you by your health care provider. Make sure you discuss any questions you have with your health care provider. Document Released: 01/20/2011 Document Revised: 12/13/2015 Document Reviewed: 04/10/2015 Elsevier Interactive Patient Education  2019 [employer]  

## 2018-10-02 LAB — CBC
Hematocrit: 47.9 % — ABNORMAL HIGH (ref 34.0–46.6)
Hemoglobin: 15.4 g/dL (ref 11.1–15.9)
MCH: 28.9 pg (ref 26.6–33.0)
MCHC: 32.2 g/dL (ref 31.5–35.7)
MCV: 90 fL (ref 79–97)
Platelets: 275 10*3/uL (ref 150–450)
RBC: 5.33 x10E6/uL — ABNORMAL HIGH (ref 3.77–5.28)
RDW: 13.5 % (ref 11.7–15.4)
WBC: 6.1 10*3/uL (ref 3.4–10.8)

## 2018-10-02 LAB — LIPID PANEL
CHOLESTEROL TOTAL: 171 mg/dL (ref 100–199)
Chol/HDL Ratio: 3.2 ratio (ref 0.0–4.4)
HDL: 53 mg/dL (ref >39)
LDL Calculated: 106 mg/dL — ABNORMAL HIGH (ref 0–99)
TRIGLYCERIDES: 62 mg/dL (ref 0–149)
VLDL Cholesterol Cal: 12 mg/dL (ref 5–40)

## 2018-10-02 LAB — HEPATITIS C ANTIBODY: Hep C Virus Ab: <0.1 s/co ratio (ref 0.0–0.9)

## 2018-10-02 LAB — CMP14+EGFR
ALK PHOS: 103 IU/L (ref 39–117)
ALT: 11 IU/L (ref 0–32)
AST: 13 IU/L (ref 0–40)
Albumin/Globulin Ratio: 2 (ref 1.2–2.2)
Albumin: 4.1 g/dL (ref 3.8–4.9)
BUN/Creatinine Ratio: 14 (ref 9–23)
BUN: 9 mg/dL (ref 6–24)
Bilirubin Total: 0.5 mg/dL (ref 0.0–1.2)
CO2: 21 mmol/L (ref 20–29)
Calcium: 9.1 mg/dL (ref 8.7–10.2)
Chloride: 103 mmol/L (ref 96–106)
Creatinine, Ser: 0.66 mg/dL (ref 0.57–1.00)
GFR calc Af Amer: 112 mL/min/{1.73_m2} (ref >59)
GFR calc non Af Amer: 97 mL/min/{1.73_m2} (ref >59)
Globulin, Total: 2.1 g/dL (ref 1.5–4.5)
Glucose: 93 mg/dL (ref 65–99)
Potassium: 4.8 mmol/L (ref 3.5–5.2)
Sodium: 140 mmol/L (ref 134–144)
Total Protein: 6.2 g/dL (ref 6.0–8.5)

## 2018-10-02 LAB — TSH: TSH: 1.07 u[IU]/mL (ref 0.450–4.500)

## 2018-10-02 LAB — HIV ANTIBODY (ROUTINE TESTING W REFLEX): HIV Screen 4th Generation wRfx: NONREACTIVE

## 2019-02-25 ENCOUNTER — Other Ambulatory Visit: Payer: Self-pay

## 2019-02-25 ENCOUNTER — Ambulatory Visit: Payer: 59

## 2019-04-15 ENCOUNTER — Ambulatory Visit (INDEPENDENT_AMBULATORY_CARE_PROVIDER_SITE_OTHER): Payer: 59

## 2019-04-15 ENCOUNTER — Encounter: Payer: Self-pay | Admitting: Family Medicine

## 2019-04-15 ENCOUNTER — Ambulatory Visit (INDEPENDENT_AMBULATORY_CARE_PROVIDER_SITE_OTHER): Payer: 59 | Admitting: Family Medicine

## 2019-04-15 VITALS — BP 129/80 | HR 75 | Temp 98.0°F | Ht 66.0 in | Wt 231.0 lb

## 2019-04-15 DIAGNOSIS — Z23 Encounter for immunization: Secondary | ICD-10-CM

## 2019-04-15 DIAGNOSIS — R1030 Lower abdominal pain, unspecified: Secondary | ICD-10-CM

## 2019-04-15 DIAGNOSIS — Z87442 Personal history of urinary calculi: Secondary | ICD-10-CM

## 2019-04-15 DIAGNOSIS — R319 Hematuria, unspecified: Secondary | ICD-10-CM

## 2019-04-15 LAB — URINALYSIS, COMPLETE
Bilirubin, UA: NEGATIVE
Glucose, UA: NEGATIVE
Ketones, UA: NEGATIVE
Leukocytes,UA: NEGATIVE
Nitrite, UA: NEGATIVE
Protein,UA: NEGATIVE
Specific Gravity, UA: 1.015 (ref 1.005–1.030)
Urobilinogen, Ur: 0.2 mg/dL (ref 0.2–1.0)
pH, UA: 6 (ref 5.0–7.5)

## 2019-04-15 LAB — MICROSCOPIC EXAMINATION
RBC, Urine: >30 /hpf — AB (ref 0–2)
Renal Epithel, UA: NONE SEEN /hpf

## 2019-04-15 MED ORDER — PHENAZOPYRIDINE HCL 100 MG PO TABS: 100.0000 mg | ORAL_TABLET | Freq: Three times a day (TID) | ORAL | 0 refills | Status: DC | PRN

## 2019-04-15 MED ORDER — KETOROLAC TROMETHAMINE 30 MG/ML IJ SOLN
30.0000 mg | Freq: Once | INTRAMUSCULAR | Status: AC
  Administered 2019-04-15: 30 mg via INTRAMUSCULAR

## 2019-04-15 NOTE — Patient Instructions (Signed)
You have quite a bit of blood on urine sample. I have ordered xrays to look for stones. I am referring you to urology.  We'll try our best to get an appointment today but it may be next weekend. You were given a dose of Toradol for pain in office. Ok to continue the [employer] as prescribed by pain medicine.  Bladder Stone  A bladder stone is a buildup of crystals made from the proteins and minerals found in urine. These substances build up when your urine becomes too concentrated. Bladder stones usually develop when you have another medical condition that prevents your bladder from emptying completely. Crystals can form in the small amount of urine left in your bladder. Bladder stones that grow large can become painful and block the flow of urine. What are the causes? Bladder stones can be caused by:  An enlarged prostate, which prevents the bladder from emptying well.  A urinary tract infection (UTI).  A weak spot in the bladder that creates a small pouch (bladder diverticulum).  Nerve damage that may interfere with the messages from your brain to your bladder muscles (neurogenic bladder). This can result from conditions such as Parkinson disease or spinal cord injuries. What increases the risk? This condition is more likely to develop in people who:  Get frequent UTIs.  Have another medical condition that affects their bladder.  Have a history of bladder surgery.  Have a spinal cord injury.  Have an abnormally shaped bladder (deformity). What are the signs or symptoms? Small bladder stones do not always cause symptoms. Larger stones can cause symptoms that include:  Abdominal pain.  A frequent need to urinate.  Difficulty urinating.  Painful urination.  Blood in the urine.  Cloudy or dark colored urine.  Pain in the penis or testicles for men. How is this diagnosed? This condition is diagnosed based on your symptoms, medical history, and a physical exam. The exam will  include checking for abdominal tenderness. For men, a rectal exam may be done to check the prostate gland. You may also have other tests, such as:  A urine test (urinalysis) to find out more about your condition.  A urine sample test to check for other infections (culture).  Blood tests, including tests to look for a substance called creatinine. A creatinine level that is higher than normal could indicate a blockage.  A procedure to examine the inside of your bladder using a thin scope with a tiny lighted camera (cystoscopy) inserted through the urethra. You may also have imaging studies such as:  A CT scan of your abdomen and pelvis to look for a stone and check whether it is blocking the flow of urine.  An X-ray of your kidneys, ureters, bladder, and urethra after you have a type of dye (contrast material) injected into your veins (intravenous pyelogram or IVP).  An abdominal and pelvic ultrasound to locate bladder stones and identify areas where urine flow is blocked. How is this treated?  Small bladder stones do not require treatment. They can pass out of your body on their own. You may be instructed to drink extra water to help the stone pass through the bladder. Larger stones may need to be removed with one of the following procedures:  Cystolitholapaxy. A cystoscope is inserted through the urethra and into the bladder to view the stone. A laser, ultrasound, or other device is used to break the stone into smaller pieces. Fluids are used to flush the small pieces from the  area.  Surgical removal. You may need surgery to remove the stone if it is large and causing pain. A small incision is made in the bladder to directly remove the stone.  If the stone blocks the flow of urine, you may have a thin, flexible tube (stent) threaded into your ureter. The stent may be left in place after removal of a stone to ensure flow of urine until healing is complete. Follow these instructions at home:   Drink enough fluid to keep your urine pale yellow.  Report unusual urinary symptoms to your health care provider. Early diagnosis of an enlarged prostate and other bladder conditions may reduce your chance of getting bladder stones.  Avoid smoking and illegal drug use. Contact a health care provider if:  You have a fever.  You feel nauseous or vomit.  You are unable to urinate.  You have a large amount of blood in your urine. Get help right away if:  You have severe back pain or lower abdominal pain.  You are vomiting and cannot keep down any medicines or water. This information is not intended to replace advice given to you by your health care provider. Make sure you discuss any questions you have with your health care provider. Document Released: 07/22/2015 Document Revised: 07/17/2017 Document Reviewed: 07/22/2015 Elsevier Patient Education  2020 [employer].

## 2019-04-15 NOTE — Progress Notes (Signed)
Subjective: CC: abdominal pain PCP: Raliegh IpGottschalk, Ashly M, DO ZOX:WRUEHPI:Sydney [name] is a 58 y.o. female presenting to clinic today for:  1.  Abdominal pain Patient reports 2-day history of abdominal pain.  At onset it was quite severe and she was not quite sure if this was hip or abdominal pain and therefore she did not get evaluated.  She contacted her pain specialist who sent in Norco for her.  She is to pick this up today.  She denies any dysuria, increased urinary frequency, fevers, nausea, vomiting.  She does have a history of renal stones and has had retained bladder stones in the past.  Since moving here, she has not reestablished with a new urologist.   ROS: Per HPI  Allergies  Allergen Reactions  . Bee Pollen Anaphylaxis and Swelling  . Butorphanol Other (See Comments)    Not sure told by md she was allergic after surgery.   . Meloxicam Anxiety    Other reaction(s): Other (See Comments) Mood disorder Altered her personality   . Penicillins Anaphylaxis and Hives    Other reaction(s): Unable to Recall As child mom was told she is highly allergic   . Prochlorperazine Edisylate Anaphylaxis  . Sulfa Antibiotics Anaphylaxis    Other reaction(s): Unable to Recall  . Tramadol Anxiety    Didn't like the way it made her feel   . Topiramate Nausea And Vomiting   Past Medical History:  Diagnosis Date  . Allergy   . Anxiety   . Arthritis   . Asthma   . Depression   . GERD (gastroesophageal reflux disease)   . Hypertension   . TIA (transient ischemic attack) 2012    Current Outpatient Medications:  .  albuterol (PROAIR HFA) 108 (90 Base) MCG/ACT inhaler, Inhale 2 puffs into the lungs every 6 (six) hours as needed for wheezing or shortness of breath., Disp: 3 Inhaler, Rfl: 0 .  amLODipine (NORVASC) 5 MG tablet, Take 1 tablet (5 mg total) by mouth daily., Disp: 90 tablet, Rfl: 3 .  fexofenadine (ALLEGRA) 60 MG tablet, Take by mouth., Disp: , Rfl:  .   HYDROcodone-acetaminophen (NORCO) 5-325 MG tablet, Norco 5 mg-325 mg tablet  Take 1 tablet twice a day by oral route., Disp: , Rfl:  .  metoprolol succinate (TOPROL-XL) 25 MG 24 hr tablet, Take 1 tablet (25 mg total) by mouth daily., Disp: 90 tablet, Rfl: 3 .  sertraline (ZOLOFT) 100 MG tablet, sertraline 100 mg tablet, Disp: , Rfl:  .  traZODone (DESYREL) 100 MG tablet, trazodone 100 mg tablet, Disp: , Rfl:  Social History   Socioeconomic History  . Marital status: Married    Spouse name: Not on file  . Number of children: 3  . Years of education: Not on file  . Highest education level: Not on file  Occupational History  . Not on file  Social Needs  . Financial resource strain: Not on file  . Food insecurity    Worry: Not on file    Inability: Not on file  . Transportation needs    Medical: Not on file    Non-medical: Not on file  Tobacco Use  . Smoking status: Current Every Day Smoker    Packs/day: 0.50    Years: 15.00    Pack years: 7.50    Types: Cigarettes  . Smokeless tobacco: Never Used  Substance and Sexual Activity  . Alcohol use: Yes    Comment: occ  . Drug use: Never  . Sexual  activity: Not Currently  Lifestyle  . Physical activity    Days per week: Not on file    Minutes per session: Not on file  . Stress: Not on file  Relationships  . Social Musician on phone: Not on file    Gets together: Not on file    Attends religious service: Not on file    Active member of club or organization: Not on file    Attends meetings of clubs or organizations: Not on file    Relationship status: Not on file  . Intimate partner violence    Fear of current or ex partner: Not on file    Emotionally abused: Not on file    Physically abused: Not on file    Forced sexual activity: Not on file  Other Topics Concern  . Not on file  Social History Narrative   Recently relocated to [place] from [place].  She resides at home with her husband.   She has 3  children but 1 passed away from cancer.   Family History  Problem Relation Age of Onset  . Anxiety disorder Mother   . Depression Mother   . Heart disease Mother   . Hypertension Mother   . Arrhythmia Mother   . Cancer Father   . Lung cancer Father   . Migraines Sister   . Alcohol abuse Brother   . Cancer Daughter        nerve sheath sarcoma    Objective: Office vital signs reviewed. BP 129/80   Pulse 75   Temp 98 F (36.7 C) (Temporal)   Ht 5\' 6"  (1.676 m)   Wt 231 lb (104.8 kg)   BMI 37.28 kg/m   Physical Examination:  General: Awake, alert, well nourished, No acute distress GU: +suprapubic TTP, RLQ and LLQ TTP. No guarding or peritoneal signs. No CVA TTP  Assessment/ Plan: 58 y.o. female   1. Lower abdominal pain I suspect that she has a bladder stone, particularly given history.  Her urinalysis was notable for red blood cells.  X-ray was obtained, awaiting formal review by radiology.  I placed a referral back to her urologist.  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  She was given a dose of Toradol here in office.  I have also sent in Pyridium for bladder mucosal irritation.  We discussed red flag signs and symptoms warranting further evaluation emergency department.  She voiced good understanding. - Urinalysis, Complete - DG Abd 1 View; Future - Ambulatory referral to Urology - ketorolac (TORADOL) 30 MG/ML injection 30 mg - phenazopyridine (PYRIDIUM) 100 MG tablet; Take 1 tablet (100 mg total) by mouth 3 (three) times daily as needed for pain.  Dispense: 10 tablet; Refill: 0  2. History of renal stone - DG Abd 1 View; Future - Ambulatory referral to Urology - ketorolac (TORADOL) 30 MG/ML injection 30 mg  3. Hematuria, unspecified type - Urinalysis, Complete - DG Abd 1 View; Future - Ambulatory referral to Urology   Orders Placed This Encounter  Procedures  . DG  Abd 1 View    Standing Status:   Future    Standing Expiration Date:   06/14/2020    Order Specific Question:   Reason for Exam (SYMPTOM  OR DIAGNOSIS REQUIRED)    Answer:   history of retained stone in bladder    Order Specific Question:   Is  the patient pregnant?    Answer:   No    Order Specific Question:   Preferred imaging location?    Answer:   Internal  . Urinalysis, Complete  . Ambulatory referral to Urology    Referral Priority:   Urgent    Referral Type:   Consultation    Referral Reason:   Specialty Services Required    Requested Specialty:   Urology    Number of Visits Requested:   1   No orders of the defined types were placed in this encounter.    Janora Norlander, DO [place] [phone]

## 2019-04-16 ENCOUNTER — Other Ambulatory Visit: Payer: Self-pay | Admitting: Family Medicine

## 2019-04-16 DIAGNOSIS — N3000 Acute cystitis without hematuria: Secondary | ICD-10-CM

## 2019-04-16 MED ORDER — CIPROFLOXACIN HCL 500 MG PO TABS: 500.0000 mg | ORAL_TABLET | Freq: Two times a day (BID) | ORAL | 0 refills | Status: AC

## 2019-04-16 NOTE — Progress Notes (Signed)
**  [place] After Hours/ Emergency Line Call**  Patient: Sydney [name].  PCP: Janora Norlander, DO  Endorsing ongoing bladder pain and reports some low grade fevers to 99.60F.  Given anaphylaxis to sulfa and PCN, cipro mg sent in BID to cover for complicated UTI.  Red flags discussed.    Meds ordered this encounter  Medications  . ciprofloxacin (CIPRO) 500 MG tablet    Sig: Take 1 tablet (500 mg total) by mouth 2 (two) times daily for 7 days.    Dispense:  14 tablet    Refill:  0    Lott Seelbach M. Lajuana Ripple, DO

## 2019-04-19 ENCOUNTER — Emergency Department (HOSPITAL_COMMUNITY)
Admission: EM | Admit: 2019-04-19 | Discharge: 2019-04-19 | Disposition: A | Discharge status: Home / Self Care | Payer: No Typology Code available for payment source | Attending: Emergency Medicine | Admitting: Emergency Medicine

## 2019-04-19 ENCOUNTER — Encounter (HOSPITAL_COMMUNITY): Payer: Self-pay

## 2019-04-19 ENCOUNTER — Emergency Department (HOSPITAL_COMMUNITY): Payer: No Typology Code available for payment source

## 2019-04-19 ENCOUNTER — Other Ambulatory Visit: Payer: Self-pay

## 2019-04-19 ENCOUNTER — Telehealth: Payer: Self-pay | Admitting: Family Medicine

## 2019-04-19 DIAGNOSIS — J45909 Unspecified asthma, uncomplicated: Secondary | ICD-10-CM | POA: Diagnosis not present

## 2019-04-19 DIAGNOSIS — Z885 Allergy status to narcotic agent status: Secondary | ICD-10-CM | POA: Diagnosis not present

## 2019-04-19 DIAGNOSIS — Z888 Allergy status to other drugs, medicaments and biological substances status: Secondary | ICD-10-CM | POA: Insufficient documentation

## 2019-04-19 DIAGNOSIS — N201 Calculus of ureter: Secondary | ICD-10-CM

## 2019-04-19 DIAGNOSIS — Z882 Allergy status to sulfonamides status: Secondary | ICD-10-CM | POA: Diagnosis not present

## 2019-04-19 DIAGNOSIS — Z8673 Personal history of transient ischemic attack (TIA), and cerebral infarction without residual deficits: Secondary | ICD-10-CM | POA: Insufficient documentation

## 2019-04-19 DIAGNOSIS — Z880 Allergy status to penicillin: Secondary | ICD-10-CM | POA: Insufficient documentation

## 2019-04-19 DIAGNOSIS — F17210 Nicotine dependence, cigarettes, uncomplicated: Secondary | ICD-10-CM | POA: Diagnosis not present

## 2019-04-19 DIAGNOSIS — N1330 Unspecified hydronephrosis: Secondary | ICD-10-CM

## 2019-04-19 DIAGNOSIS — R1032 Left lower quadrant pain: Secondary | ICD-10-CM | POA: Diagnosis present

## 2019-04-19 DIAGNOSIS — Z91030 Bee allergy status: Secondary | ICD-10-CM | POA: Insufficient documentation

## 2019-04-19 DIAGNOSIS — I10 Essential (primary) hypertension: Secondary | ICD-10-CM | POA: Diagnosis not present

## 2019-04-19 DIAGNOSIS — Z79899 Other long term (current) drug therapy: Secondary | ICD-10-CM | POA: Diagnosis not present

## 2019-04-19 DIAGNOSIS — N132 Hydronephrosis with renal and ureteral calculous obstruction: Secondary | ICD-10-CM | POA: Insufficient documentation

## 2019-04-19 LAB — BASIC METABOLIC PANEL
Anion gap: 8 (ref 5–15)
BUN: 11 mg/dL (ref 6–20)
CO2: 23 mmol/L (ref 22–32)
Calcium: 8.7 mg/dL — ABNORMAL LOW (ref 8.9–10.3)
Chloride: 105 mmol/L (ref 98–111)
Creatinine, Ser: 0.54 mg/dL (ref 0.44–1.00)
GFR calc Af Amer: >60 mL/min (ref >60)
GFR calc non Af Amer: >60 mL/min (ref >60)
Glucose, Bld: 100 mg/dL — ABNORMAL HIGH (ref 70–99)
Potassium: 4 mmol/L (ref 3.5–5.1)
Sodium: 136 mmol/L (ref 135–145)

## 2019-04-19 LAB — CBC WITH DIFFERENTIAL/PLATELET
Abs Immature Granulocytes: 0.02 10*3/uL (ref 0.00–0.07)
Basophils Absolute: 0 10*3/uL (ref 0.0–0.1)
Basophils Relative: 1 %
Eosinophils Absolute: 0.1 10*3/uL (ref 0.0–0.5)
Eosinophils Relative: 1 %
HCT: 46.1 % — ABNORMAL HIGH (ref 36.0–46.0)
Hemoglobin: 14.4 g/dL (ref 12.0–15.0)
Immature Granulocytes: 0 %
Lymphocytes Relative: 19 %
Lymphs Abs: 1.2 10*3/uL (ref 0.7–4.0)
MCH: 29.8 pg (ref 26.0–34.0)
MCHC: 31.2 g/dL (ref 30.0–36.0)
MCV: 95.2 fL (ref 80.0–100.0)
Monocytes Absolute: 0.4 10*3/uL (ref 0.1–1.0)
Monocytes Relative: 6 %
Neutro Abs: 4.4 10*3/uL (ref 1.7–7.7)
Neutrophils Relative %: 73 %
Platelets: 252 10*3/uL (ref 150–400)
RBC: 4.84 MIL/uL (ref 3.87–5.11)
RDW: 13.6 % (ref 11.5–15.5)
WBC: 6.1 10*3/uL (ref 4.0–10.5)
nRBC: 0 % (ref 0.0–0.2)

## 2019-04-19 LAB — URINALYSIS, ROUTINE W REFLEX MICROSCOPIC
Bilirubin Urine: NEGATIVE
Glucose, UA: NEGATIVE mg/dL
Ketones, ur: NEGATIVE mg/dL
Leukocytes,Ua: NEGATIVE
Nitrite: NEGATIVE
Protein, ur: NEGATIVE mg/dL
Specific Gravity, Urine: 1.013 (ref 1.005–1.030)
pH: 6 (ref 5.0–8.0)

## 2019-04-19 MED ORDER — ONDANSETRON 4 MG PO TBDP: 4.0000 mg | ORAL_TABLET | Freq: Three times a day (TID) | ORAL | 0 refills | Status: DC | PRN

## 2019-04-19 NOTE — Telephone Encounter (Signed)
Patient was seen last week for kidney stones and was given Pyridium and a Toradol injection.  She has Norco from her pain doctor and has been taking that also.  She has an appointment scheduled in two days with the urologist, but is in severe pain that has been uncontrollable with the medications she has.  I advised the patient to go to the ER to be evaluated.

## 2019-04-19 NOTE — ED Notes (Signed)
Pt passed a kidney stone while giving urine sample

## 2019-04-19 NOTE — ED Triage Notes (Signed)
Pt had a CT done on Friday and showed 2 kidney stones on left kidney. Is experiencing lots of pressure in her bladder as well. Is taking antibiotics and Azo.

## 2019-04-19 NOTE — Discharge Instructions (Addendum)
You were seen in the ER for difficulty urinating and left back pain.  Urine today had red blood cells on blood but no signs of infection.  CT confirms you have 1 4 mm stone in the ureter trying to pass.  There is mild swelling around this which is typical.  You were able to pass 1 stone today here in the ER but this was before the CT was done so I suspect you still have 1 more to pass.  Treatment will include pain control, nausea control, oral hydration.  You declined Flomax due to allergies.  Take 600 mg of ibuprofen every 6 hours.  Alternate your Norco as needed every 4 hours.  Use Zofran as needed for nausea.  Return to the ER immediately for persistent, refractory pain, vomiting, fever greater than 100, inability to void.  Go to your urology appointment in the next 2 days for reevaluation.

## 2019-04-19 NOTE — ED Provider Notes (Signed)
[facility] EMERGENCY DEPARTMENT Provider Note   CSN: [id number] Arrival date & time: 04/19/19  1748     History   Chief Complaint Chief Complaint  Patient presents with  . LEFT FLANK PAIN    HPI Sydney [name] is a 58 y.o. female presents to the ER for evaluation of "kidney problems".  She describes sharp knifelike pain deep in the vaginal area, initially was intermittent but now has become constant.  She feels like there is a lot of pressure in her lower abdomen and bladder.  This is worse with urination and slightly better if she sits off to the side and takes the pressure off.  Today she developed left low back pain that is constant.  Has associated chills but no fever, increased but smaller volume urination.  She went to her PCP on Friday who did a next ray of her abdomen and told her she had 2 kidney stones on the left side.  She was prescribed ciprofloxacin and Pyridium.  She is taking Norco as needed but this is not helping the pain.  She denies any fever, vomiting, changes to her stools, abnormal vaginal bleeding or discharge.  She has history of 2 cesarean sections, total hysterectomy, bladder suspension surgery.  She has an appointment with urology on Thursday.  No alleviating factors.       HPI  Past Medical History:  Diagnosis Date  . Allergy   . Anxiety   . Arthritis   . Asthma   . Depression   . GERD (gastroesophageal reflux disease)   . Hypertension   . TIA (transient ischemic attack) 2012    Patient Active Problem List   Diagnosis Date Noted  . Back pain 07/01/2018  . DDD (degenerative disc disease), lumbosacral 07/01/2018  . Venous insufficiency of both lower extremities 11/17/2016  . 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 Morbid obesity with BMI of 40.0-44.9, adult ([place]) 07/28/2016  . Chronic pain syndrome 10/24/2015  . Dysfunction of both eustachian tubes 07/26/2014  . Calculus of left kidney 04/25/2014  . Impaired  fasting glucose 04/05/2014  . History of bladder surgery 04/03/2014  . History of hysterectomy 04/03/2014  . Urge incontinence of urine 04/03/2014  . Asthma 07/28/2013  . Nontoxic uninodular goiter 07/28/2013  . Solitary pulmonary nodule 07/28/2013  . Lumbar radiculopathy 04/20/2013  . Insomnia 03/22/2013  . Essential hypertension 08/18/2011  . Tobacco use disorder 08/18/2011  . Moderate episode of recurrent major depressive disorder ([place]) 04/03/2011  . Other B-complex deficiencies 08/16/2010  . Family history of cerebrovascular accident (CVA) 04/17/2010  . Family history of malignant neoplasm of gastrointestinal tract 04/17/2010  . Family history of other cardiovascular diseases(V17.49) 04/17/2010    History reviewed. No pertinent surgical history.   OB History   No obstetric history on file.      Home Medications    Prior to Admission medications   Medication Sig Start Date End Date Taking? Authorizing Provider  albuterol (PROAIR HFA) 108 (90 Base) MCG/ACT inhaler Inhale 2 puffs into the lungs every 6 (six) hours as needed for wheezing or shortness of breath. 07/01/18   Janora Norlander, DO  amLODipine (NORVASC) 5 MG tablet Take 1 tablet (5 mg total) by mouth daily. 07/01/18   Janora Norlander, DO  ciprofloxacin (CIPRO) 500 MG tablet Take 1 tablet (500 mg total) by mouth 2 (two) times daily for 7 days. 04/16/19 04/23/19  Janora Norlander, DO  fexofenadine (ALLEGRA) 60 MG tablet Take  by mouth.    [provider]  HYDROcodone-acetaminophen (NORCO) 5-325 MG tablet Norco 5 mg-325 mg tablet  Take 1 tablet twice a day by oral route.    [provider]  metoprolol succinate (TOPROL-XL) 25 MG 24 hr tablet Take 1 tablet (25 mg total) by mouth daily. 07/01/18   Raliegh IpGottschalk, Ashly M, DO  ondansetron (ZOFRAN ODT) 4 MG disintegrating tablet Take 1 tablet (4 mg total) by mouth every 8 (eight) hours as needed for nausea or vomiting. 04/19/19   Liberty HandyGibbons, Jakaila Norment J, PA-C   phenazopyridine (PYRIDIUM) 100 MG tablet Take 1 tablet (100 mg total) by mouth 3 (three) times daily as needed for pain. 04/15/19   Raliegh IpGottschalk, Ashly M, DO  sertraline (ZOLOFT) 100 MG tablet sertraline 100 mg tablet 11/21/13   [provider]  traZODone (DESYREL) 100 MG tablet trazodone 100 mg tablet 07/28/13   [provider]    Family History Family History  Problem Relation Age of Onset  . Anxiety disorder Mother   . Depression Mother   . Heart disease Mother   . Hypertension Mother   . Arrhythmia Mother   . Cancer Father   . Lung cancer Father   . Migraines Sister   . Alcohol abuse Brother   . Cancer Daughter        nerve sheath sarcoma    Social History Social History   Tobacco Use  . Smoking status: Current Every Day Smoker    Packs/day: 0.50    Years: 15.00    Pack years: 7.50    Types: Cigarettes  . Smokeless tobacco: Never Used  Substance Use Topics  . Alcohol use: Yes    Comment: occ  . Drug use: Never     Allergies   Bee pollen, Butorphanol, Meloxicam, Penicillins, Prochlorperazine edisylate, Sulfa antibiotics, Tramadol, and Topiramate   Review of Systems Review of Systems  Constitutional: Positive for chills.  Genitourinary: Positive for difficulty urinating, flank pain, frequency and pelvic pain.  All other systems reviewed and are negative.    Physical Exam Updated Vital Signs BP (!) 141/80 (BP Location: Right Arm)   Pulse 71   Temp 98.3 F (36.8 C) (Oral)   Ht 5\' 6"  (1.676 m)   Wt 104.3 kg   SpO2 98%   BMI 37.12 kg/m   Physical Exam Vitals signs and nursing note reviewed.  Constitutional:      Appearance: She is well-developed.     Comments: Non toxic in NAD  HENT:     Head: Normocephalic and atraumatic.     Nose: Nose normal.  Eyes:     Conjunctiva/sclera: Conjunctivae normal.  Neck:     Musculoskeletal: Normal range of motion.  Cardiovascular:     Rate and Rhythm: Normal rate and regular rhythm.  Pulmonary:      Effort: Pulmonary effort is normal.     Breath sounds: Normal breath sounds.  Abdominal:     General: Bowel sounds are normal.     Palpations: Abdomen is soft.     Tenderness: There is abdominal tenderness.     Comments: Mild left mid abdominal and suprapubic tenderness.  No guarding.  No CVA tenderness.  Obese abdomen.  Negative Murphy's and McBurney's.  Active bowel sounds to lower quadrants.  No pulsatility.  No distention.    Musculoskeletal: Normal range of motion.  Skin:    General: Skin is warm and dry.     Capillary Refill: Capillary refill takes less than 2 seconds.  Neurological:  Mental Status: She is alert.  Psychiatric:        Behavior: Behavior normal.      ED Treatments / Results  Labs (all labs ordered are listed, but only abnormal results are displayed) Labs Reviewed  CBC WITH DIFFERENTIAL/PLATELET - Abnormal; Notable for the following components:      Result Value   HCT 46.1 (*)    All other components within normal limits  BASIC METABOLIC PANEL - Abnormal; Notable for the following components:   Glucose, Bld 100 (*)    Calcium 8.7 (*)    All other components within normal limits  URINALYSIS, ROUTINE W REFLEX MICROSCOPIC - Abnormal; Notable for the following components:   Hgb urine dipstick SMALL (*)    Bacteria, UA RARE (*)    All other components within normal limits  URINE CULTURE    EKG None  Radiology Ct Renal Stone Study  Result Date: 04/19/2019 CLINICAL DATA:  58 year old female with increasing abdominal and flank pain today. Known urinary calculi. EXAM: CT ABDOMEN AND PELVIS WITHOUT CONTRAST TECHNIQUE: Multidetector CT imaging of the abdomen and pelvis was performed following the standard protocol without IV contrast. COMPARISON:  None. FINDINGS: Please note that parenchymal abnormalities may be missed without intravenous contrast. Lower chest: No acute abnormality. Hepatobiliary: The liver is unremarkable. Patient is status post  cholecystectomy. No biliary dilatation. Pancreas: Unremarkable Spleen: Unremarkable Adrenals/Urinary Tract: A 4 mm proximal LEFT ureteral calculus causes mild LEFT hydronephrosis. At least 3 non obstructing LEFT renal calculi are identified measuring 3-5 mm. The RIGHT kidney, adrenal glands and bladder are unremarkable. Stomach/Bowel: Stomach is within normal limits. No evidence of bowel wall thickening, distention, or inflammatory changes. Vascular/Lymphatic: Aortic atherosclerosis. No enlarged abdominal or pelvic lymph nodes. Reproductive: Status post hysterectomy. No adnexal masses. Other: No ascites, pneumoperitoneum or focal collection. Musculoskeletal: No acute or suspicious bony abnormalities. Multilevel degenerative disc disease, spondylosis and facet arthropathy noted within the lumbar spine. IMPRESSION: 1. 4 mm proximal LEFT ureteral calculus causing mild LEFT hydronephrosis. 2. LEFT nephrolithiasis 3.  Aortic Atherosclerosis (ICD10-I70.0). Electronically Signed   By: Harmon Pier M.D.   On: 04/19/2019 20:58    Procedures Procedures (including critical care time)  Medications Ordered in ED Medications - No data to display   Initial Impression / Assessment and Plan / ED Course  I have reviewed the triage vital signs and the nursing notes.  Pertinent labs & imaging results that were available during my care of the patient were reviewed by me and considered in my medical decision making (see chart for details).  Clinical Course as of Apr 19 2239  Tue Apr 19, 2019  2059 Hgb urine dipstick(!): SMALL [CG]  2059 RBC / HPF: 21-50 [CG]  2059 Bacteria, UA(!): RARE [CG]  2133 IMPRESSION: 1. 4 mm proximal LEFT ureteral calculus causing mild LEFT hydronephrosis. 2. LEFT nephrolithiasis 3. Aortic Atherosclerosis (ICD10-I70.0).    CT Renal [place] [CG]    Clinical Course User Index [CG] Liberty Handy, PA-C   EMR reviewed.  Highest on differential diagnosis is GU process such as  UTI, pyelonephritis, ureteral stone.  ER work up reviewed.   No leukocytosis.  Creatinine is normal.  UA status 21-50 RBCs, rare bacteria but no nitrates, leukocytes, WBCs.  She is already on ciprofloxacin.  We will send urine for culture.  CT renal reviewed by me independently as well as radiologist.  There is a 4 mm proximal left ureteral calculus with mild left hydronephrosis.  She was reevaluated and  has no clinical decline. Patient had tolerable discomfort here in the ER, no nausea or vomiting.  She actually collected a small 5 mm stone she voided prior to CT.  I think she is appropriate for discharge.  She has urology follow-up in 2 days.  She declined flomax due to anaphylaxis to sulfa.  Will dc with NSAID, norco, antiemetic, oral hydration. Return precautions given. Pt comfortable with this.   Final Clinical Impressions(s) / ED Diagnoses   Final diagnoses:  Left ureteral calculus  Hydronephrosis, left    ED Discharge Orders         Ordered    ondansetron (ZOFRAN ODT) 4 MG disintegrating tablet  Every 8 hours PRN     04/19/19 2150           Liberty Handy, PA-C 04/19/19 2240    Sabas Sous, MD 04/24/19 1034

## 2019-04-21 LAB — URINE CULTURE: Culture: <10000 — AB

## 2019-06-08 ENCOUNTER — Other Ambulatory Visit: Payer: Self-pay | Admitting: Urology

## 2019-06-08 DIAGNOSIS — N200 Calculus of kidney: Secondary | ICD-10-CM

## 2019-06-13 ENCOUNTER — Encounter (HOSPITAL_COMMUNITY): Payer: Self-pay

## 2019-06-13 ENCOUNTER — Ambulatory Visit (HOSPITAL_COMMUNITY): Payer: 59

## 2019-06-16 ENCOUNTER — Other Ambulatory Visit: Payer: Self-pay | Admitting: Family Medicine

## 2019-07-04 ENCOUNTER — Other Ambulatory Visit: Payer: Self-pay | Admitting: Urology

## 2019-07-04 ENCOUNTER — Other Ambulatory Visit (HOSPITAL_COMMUNITY): Payer: Self-pay | Admitting: Urology

## 2019-07-05 ENCOUNTER — Other Ambulatory Visit: Payer: Self-pay | Admitting: Family Medicine

## 2019-07-05 MED ORDER — AMLODIPINE BESYLATE 5 MG PO TABS: 5.0000 mg | ORAL_TABLET | Freq: Every day | ORAL | 2 refills | Status: DC

## 2019-07-05 MED ORDER — METOPROLOL SUCCINATE ER 25 MG PO TB24: 25.0000 mg | ORAL_TABLET | Freq: Every day | ORAL | 2 refills | Status: DC

## 2019-07-28 ENCOUNTER — Other Ambulatory Visit: Payer: Self-pay

## 2019-07-28 ENCOUNTER — Ambulatory Visit (INDEPENDENT_AMBULATORY_CARE_PROVIDER_SITE_OTHER): Payer: No Typology Code available for payment source | Admitting: Orthopaedic Surgery

## 2019-07-28 ENCOUNTER — Ambulatory Visit: Payer: Self-pay

## 2019-07-28 ENCOUNTER — Encounter: Payer: Self-pay | Admitting: Orthopaedic Surgery

## 2019-07-28 VITALS — BP 154/92 | HR 72 | Ht 65.5 in | Wt 230.0 lb

## 2019-07-28 DIAGNOSIS — M25552 Pain in left hip: Secondary | ICD-10-CM | POA: Diagnosis not present

## 2019-07-28 DIAGNOSIS — M25551 Pain in right hip: Secondary | ICD-10-CM

## 2019-07-28 DIAGNOSIS — M1612 Unilateral primary osteoarthritis, left hip: Secondary | ICD-10-CM

## 2019-07-28 NOTE — Progress Notes (Signed)
Office Visit Note   Patient: Sydney [name]           Date of Birth: 1960-11-05           MRN: [id number] Visit Date: 07/28/2019              Requested by: Janora Norlander, DO [place],  [place] [address] PCP: Janora Norlander, DO   Assessment & Plan: Visit Diagnoses:  1. Bilateral hip pain   2. Unilateral primary osteoarthritis, left hip     Plan: Patient's been on different anti-inflammatory she has had intra-articular hip injection without relief.  She has been using some hydrocodone sparingly 60 tablets lasting her for months.  She states she is ready to proceed with left total hip arthroplasty.  With her past history of back problems as well as problems with her opposite right hip osteoarthritis as well as knee symptoms I do not think she be a candidate for same-day surgery.  She likely stay overnight.  We discussed using a walker postop.  Questions elicited and answered.  Patient states she like to proceed with scheduling.  Follow-Up Instructions: pre-op for left THA -direct anterior approach.   Orders:  Orders Placed This Encounter  Procedures  . XR HIPS BILAT W OR W/O PELVIS 3-4 VIEWS   No orders of the defined types were placed in this encounter.     Procedures: No procedures performed   Clinical Data: No additional findings.   Subjective: Chief Complaint  Patient presents with  . Left Hip - Pain  . Right Hip - Pain    HPI 59 year old female with bilateral hip osteoarthritis who has been followed in [place] at [facility].  She has had radiofrequency ablation of her facet joints after epidural injections.  She has some lumbar disc degeneration has more severe problems with bilateral hip osteoarthritis worse on the left hip the right hip.  She has been on different anti-inflammatories.  She is taken hydrocodone originally 5 mg now 7.5/325 for pain sparingly.  60 tablets last her 4 months.  She has been ambulatory with a limp trouble  sleeping.  She has been to physical therapy at [facility] in [place] without relief.  Patient's husband has had joint replacement by me and patient states she is once to talk about scheduling left total hip arthroplasty.  Her PCP is Dr. Adam Phenix.  Review of Systems previous surgeries include appendectomy C-section x2, hysterectomy gallbladder surgery and cervical spine fusion 2017.  Patient does not work she smokes 1/2 pack/day occasional drinker.  Positive for kidney stones pneumonia migraines hypertension depression.  Overweight BMI 37.   Objective: Vital Signs: BP (!) 154/92   Pulse 72   Ht 5' 5.5" (1.664 m)   Wt 230 lb (104.3 kg)   BMI 37.69 kg/m   Physical Exam Constitutional:      Appearance: She is well-developed.  HENT:     Head: Normocephalic.     Right Ear: External ear normal.     Left Ear: External ear normal.  Eyes:     Pupils: Pupils are equal, round, and reactive to light.  Neck:     Thyroid: No thyromegaly.     Trachea: No tracheal deviation.  Cardiovascular:     Rate and Rhythm: Normal rate.  Pulmonary:     Effort: Pulmonary effort is normal.  Abdominal:     Palpations: Abdomen is soft.  Skin:    General: Skin is warm and dry.  Neurological:     Mental Status: She is alert and oriented to person, place, and time.  Psychiatric:        Behavior: Behavior normal.     Ortho Exam patient is amatory with bilateral Trendelenburg gait.  Left hip internal rotation only 10 degrees with reproduction of her groin pain.  Right hip externally rotates 20 degrees with pain.  Patient cannot figure 4 right or left.  No hip flexion contracture.  She has some crepitus right knee range of motion none on the left.  Distal pulses are 2+.  No sciatic notch tenderness.  Specialty Comments:  No specialty comments available.  Imaging: XR HIPS BILAT W OR W/O PELVIS 3-4 VIEWS  Result Date: 07/28/2019 Standing AP pelvis bilateral frog-leg hip x-rays are obtained and reviewed.   This shows bilateral hip osteoarthritis with large marginal osteophyte subchondral sclerosis subchondral cyst formation and loss of joint space.  Flattening of the femoral head noted bilaterally. Impression: Moderate to severe bilateral hip osteoarthritis    PMFS History: Patient Active Problem List   Diagnosis Date Noted  . Unilateral primary osteoarthritis, left hip 07/28/2019  . Back pain 07/01/2018  . DDD (degenerative disc disease), lumbosacral 07/01/2018  . Venous insufficiency of both lower extremities 11/17/2016  . 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 Morbid obesity with BMI of 40.0-44.9, adult ([facility]) 07/28/2016  . Chronic pain syndrome 10/24/2015  . Dysfunction of both eustachian tubes 07/26/2014  . Calculus of left kidney 04/25/2014  . Impaired fasting glucose 04/05/2014  . History of bladder surgery 04/03/2014  . History of hysterectomy 04/03/2014  . Urge incontinence of urine 04/03/2014  . Asthma 07/28/2013  . Nontoxic uninodular goiter 07/28/2013  . Solitary pulmonary nodule 07/28/2013  . Lumbar radiculopathy 04/20/2013  . Insomnia 03/22/2013  . Essential hypertension 08/18/2011  . Tobacco use disorder 08/18/2011  . Moderate episode of recurrent major depressive disorder ([facility]) 04/03/2011  . Other B-complex deficiencies 08/16/2010  . Family history of cerebrovascular accident (CVA) 04/17/2010  . Family history of malignant neoplasm of gastrointestinal tract 04/17/2010  . Family history of other cardiovascular diseases(V17.49) 04/17/2010   Past Medical History:  Diagnosis Date  . Allergy   . Anxiety   . Arthritis   . Asthma   . Depression   . GERD (gastroesophageal reflux disease)   . Hypertension   . TIA (transient ischemic attack) 2012    Family History  Problem Relation Age of Onset  . Anxiety disorder Mother   . Depression Mother   . Heart disease Mother   . Hypertension Mother   . Arrhythmia Mother   . Cancer Father     . Lung cancer Father   . Migraines Sister   . Alcohol abuse Brother   . Cancer Daughter        nerve sheath sarcoma    No past surgical history on file. Social History   Occupational History  . Not on file  Tobacco Use  . Smoking status: Current Every Day Smoker    Packs/day: 0.50    Years: 15.00    Pack years: 7.50    Types: Cigarettes  . Smokeless tobacco: Never Used  Substance and Sexual Activity  . Alcohol use: Yes    Comment: occ  . Drug use: Never  . Sexual activity: Not Currently

## 2019-08-22 ENCOUNTER — Other Ambulatory Visit: Payer: Self-pay

## 2019-08-23 NOTE — Pre-Procedure Instructions (Addendum)
Sydney [name]  08/23/2019      [id number] [place], [address] [place] [address] Phone: [phone] Fax: [phone]  [facility] [place] [place] [facility] [address] [place] [address] Phone: [phone] Fax: [phone]    Your procedure is scheduled on Feb. 10  Report to [facility] Entrance A at 10:30 A.M.  Call this number if you have problems the morning of surgery:  [phone]   Remember:  Do not eat after midnight.  You may drink clear liquids until 9:30A.m.Marland Kitchen                Enhanced Recovery after Surgery for Orthopedics Enhanced Recovery after Surgery is a protocol used to improve the stress on your body and your recovery after surgery.  Patient Instructions  . The night before surgery:  o No food after midnight. ONLY clear liquids after midnight  .  Marland Kitchen The day of surgery (if you do NOT have diabetes):  o Drink ONE (1) Pre-Surgery Clear Ensure as directed.   o This drink was given to you during your hospital  pre-op appointment visit. o The pre-op nurse will instruct you on the time to drink the  Pre-Surgery Ensure depending on your surgery time. o Finish the drink at the designated time by the pre-op nurse.  o Nothing else to drink after completing the  Pre-Surgery Clear Ensure.  . The day of surgery (if you have diabetes): o  o Drink ONE (1) Gatorade 2 (G2) as directed. o This drink was given to you during your hospital  pre-op appointment visit.  o The pre-op nurse will instruct you on the time to drink the   Gatorade 2 (G2) depending on your surgery time. o Color of the Gatorade may vary. Red is not allowed. o Nothing else to drink after completing the  Gatorade 2 (G2).         If you have questions, please contact your surgeon's office.   Take these medicines the morning of surgery with A  SIP OF WATER :             Albuterol inhaler -bring to hospital            Amlodipine (norvasc)            Hydrocodone  if needed            Fexofenadine (allegra) if needed            Metoprolol (toprol)            Phenazopyridine (pyridium)            Sertraline (zoloft)          7 days prior to surgery STOP taking any Aspirin (unless otherwise instructed by your surgeon), Aleve, Naproxen, Ibuprofen, Motrin, Advil, Goody's, BC's, all herbal medications, fish oil, and all vitamins.    Do not wear jewelry, make-up or nail polish.  Do not wear lotions, powders, or perfumes, or deodorant.  Do not shave 48 hours prior to surgery.  Men may shave face and neck.  Do not bring valuables to the hospital.  [facility] is not responsible for any belongings or valuables.  Contacts, dentures or bridgework may not be worn into surgery.  Leave your suitcase in the car.  After surgery it may be  brought to your room.  For patients admitted to the hospital, discharge time will be determined by your treatment team.  Patients discharged the day of surgery will not be allowed to drive home.    Special instructions:   [facility]- Preparing For Surgery  Before surgery, you can play an important role. Because skin is not sterile, your skin needs to be as free of germs as possible. You can reduce the number of germs on your skin by washing with CHG (chlorahexidine gluconate) Soap before surgery.  CHG is an antiseptic cleaner which kills germs and bonds with the skin to continue killing germs even after washing.    Oral Hygiene is also important to reduce your risk of infection.  Remember - BRUSH YOUR TEETH THE MORNING OF SURGERY WITH YOUR REGULAR TOOTHPASTE  Please do not use if you have an allergy to CHG or antibacterial soaps. If your skin becomes reddened/irritated stop using the CHG.  Do not shave (including legs and underarms) for at least 48 hours prior to first CHG shower. It is OK to shave your  face.  Please follow these instructions carefully.   1. Shower the NIGHT BEFORE SURGERY and the MORNING OF SURGERY with CHG.   2. If you chose to wash your hair, wash your hair first as usual with your normal shampoo.  3. After you shampoo, rinse your hair and body thoroughly to remove the shampoo.  4. Use CHG as you would any other liquid soap. You can apply CHG directly to the skin and wash gently with a scrungie or a clean washcloth.   5. Apply the CHG Soap to your body ONLY FROM THE NECK DOWN.  Do not use on open wounds or open sores. Avoid contact with your eyes, ears, mouth and genitals (private parts). Wash Face and genitals (private parts)  with your normal soap.  6. Wash thoroughly, paying special attention to the area where your surgery will be performed.  7. Thoroughly rinse your body with warm water from the neck down.  8. DO NOT shower/wash with your normal soap after using and rinsing off the CHG Soap.  9. Pat yourself dry with a CLEAN TOWEL.  10. Wear CLEAN PAJAMAS to bed the night before surgery, wear comfortable clothes the morning of surgery  11. Place CLEAN SHEETS on your bed the night of your first shower and DO NOT SLEEP WITH PETS.    Day of Surgery:  Do not apply any deodorants/lotions.  Please wear clean clothes to the hospital/surgery center.   Remember to brush your teeth WITH YOUR REGULAR TOOTHPASTE.    Please read over the following fact sheets that you were given. Pain Booklet, MRSA Information and Surgical Site Infection Prevention

## 2019-08-24 ENCOUNTER — Encounter (HOSPITAL_COMMUNITY)
Admission: RE | Admit: 2019-08-24 | Discharge: 2019-08-24 | Disposition: A | Discharge status: Home / Self Care | Payer: No Typology Code available for payment source | Source: Ambulatory Visit | Attending: Orthopaedic Surgery | Admitting: Orthopaedic Surgery

## 2019-08-24 ENCOUNTER — Other Ambulatory Visit: Payer: Self-pay

## 2019-08-24 ENCOUNTER — Encounter (HOSPITAL_COMMUNITY): Payer: Self-pay

## 2019-08-24 ENCOUNTER — Ambulatory Visit (HOSPITAL_COMMUNITY)
Admission: RE | Admit: 2019-08-24 | Discharge: 2019-08-24 | Disposition: A | Discharge status: Home / Self Care | Payer: No Typology Code available for payment source | Source: Ambulatory Visit | Attending: Surgery | Admitting: Surgery

## 2019-08-24 DIAGNOSIS — Z01818 Encounter for other preprocedural examination: Secondary | ICD-10-CM

## 2019-08-24 HISTORY — DX: Personal history of other diseases of the digestive system: Z87.19

## 2019-08-24 HISTORY — DX: Family history of other specified conditions: Z84.89

## 2019-08-24 HISTORY — DX: Personal history of urinary calculi: Z87.442

## 2019-08-24 HISTORY — DX: Unspecified atherosclerosis: I70.90

## 2019-08-24 HISTORY — DX: Pneumonia, unspecified organism: J18.9

## 2019-08-24 HISTORY — DX: Anemia, unspecified: D64.9

## 2019-08-24 LAB — COMPREHENSIVE METABOLIC PANEL
ALT: 14 U/L (ref 0–44)
AST: 16 U/L (ref 15–41)
Albumin: 3.7 g/dL (ref 3.5–5.0)
Alkaline Phosphatase: 77 U/L (ref 38–126)
Anion gap: 6 (ref 5–15)
BUN: 9 mg/dL (ref 6–20)
CO2: 26 mmol/L (ref 22–32)
Calcium: 8.9 mg/dL (ref 8.9–10.3)
Chloride: 106 mmol/L (ref 98–111)
Creatinine, Ser: 0.59 mg/dL (ref 0.44–1.00)
GFR calc Af Amer: >60 mL/min (ref >60)
GFR calc non Af Amer: >60 mL/min (ref >60)
Glucose, Bld: 92 mg/dL (ref 70–99)
Potassium: 4 mmol/L (ref 3.5–5.1)
Sodium: 138 mmol/L (ref 135–145)
Total Bilirubin: 0.8 mg/dL (ref 0.3–1.2)
Total Protein: 6.5 g/dL (ref 6.5–8.1)

## 2019-08-24 LAB — CBC
HCT: 48.7 % — ABNORMAL HIGH (ref 36.0–46.0)
Hemoglobin: 15.1 g/dL — ABNORMAL HIGH (ref 12.0–15.0)
MCH: 29.7 pg (ref 26.0–34.0)
MCHC: 31 g/dL (ref 30.0–36.0)
MCV: 95.9 fL (ref 80.0–100.0)
Platelets: 258 10*3/uL (ref 150–400)
RBC: 5.08 MIL/uL (ref 3.87–5.11)
RDW: 13.4 % (ref 11.5–15.5)
WBC: 5.7 10*3/uL (ref 4.0–10.5)
nRBC: 0 % (ref 0.0–0.2)

## 2019-08-24 LAB — URINALYSIS, ROUTINE W REFLEX MICROSCOPIC
Bilirubin Urine: NEGATIVE
Glucose, UA: NEGATIVE mg/dL
Hgb urine dipstick: NEGATIVE
Ketones, ur: NEGATIVE mg/dL
Leukocytes,Ua: NEGATIVE
Nitrite: NEGATIVE
Protein, ur: NEGATIVE mg/dL
Specific Gravity, Urine: 1.012 (ref 1.005–1.030)
pH: 7 (ref 5.0–8.0)

## 2019-08-24 LAB — SURGICAL PCR SCREEN
MRSA, PCR: NEGATIVE
Staphylococcus aureus: NEGATIVE

## 2019-08-24 NOTE — Progress Notes (Signed)
PCP - ashly gottschalk Cardiologist - na   Chest x-ray - 08/24/19 EKG - 08/24/19 Stress Test - 2000                           All test normal ECHO - 2000 Cardiac Cath - 2000  Sleep Study - na CPAP -   Fasting Blood Sugar - na Checks Blood Sugar _____ times a day  Blood Thinner Instructions:na Aspirin Instructions:  ERAS Protcol -yes PRE-SURGERY Ensure given  COVID TEST- 08/29/19   Anesthesia review:   Patient denies shortness of breath, fever, cough and chest pain at PAT appointment   All instructions explained to the patient, with a verbal understanding of the material. Patient agrees to go over the instructions while at home for a better understanding. Patient also instructed to self quarantine after being tested for COVID-19. The opportunity to ask questions was provided.

## 2019-08-25 ENCOUNTER — Ambulatory Visit (INDEPENDENT_AMBULATORY_CARE_PROVIDER_SITE_OTHER): Payer: No Typology Code available for payment source | Admitting: Surgery

## 2019-08-25 ENCOUNTER — Encounter: Payer: Self-pay | Admitting: Surgery

## 2019-08-25 VITALS — BP 137/85 | HR 64 | Ht 65.5 in | Wt 235.9 lb

## 2019-08-25 DIAGNOSIS — M1612 Unilateral primary osteoarthritis, left hip: Secondary | ICD-10-CM

## 2019-08-25 NOTE — Progress Notes (Signed)
59 year old white female history of end-stage DJD left hip and pain comes in for preop evaluation.  States that hip symptoms unchanged from previous visit.  She is want to proceed with total hip replacement as scheduled.  Today history and physical performed.  Surgical procedure along with potential rehab/recovery time discussed.  All questions answered.

## 2019-08-29 ENCOUNTER — Other Ambulatory Visit (HOSPITAL_COMMUNITY)
Admission: RE | Admit: 2019-08-29 | Discharge: 2019-08-29 | Disposition: A | Discharge status: Home / Self Care | Payer: No Typology Code available for payment source | Source: Ambulatory Visit | Attending: Orthopaedic Surgery | Admitting: Orthopaedic Surgery

## 2019-08-29 ENCOUNTER — Other Ambulatory Visit: Payer: Self-pay

## 2019-08-29 DIAGNOSIS — Z01812 Encounter for preprocedural laboratory examination: Secondary | ICD-10-CM | POA: Insufficient documentation

## 2019-08-29 DIAGNOSIS — Z20822 Contact with and (suspected) exposure to covid-19: Secondary | ICD-10-CM | POA: Diagnosis not present

## 2019-08-29 LAB — SARS CORONAVIRUS 2 (TAT 6-24 HRS): SARS Coronavirus 2: NEGATIVE

## 2019-08-30 MED ORDER — VANCOMYCIN HCL 1500 MG/300ML IV SOLN
1500.0000 mg | INTRAVENOUS | Status: AC
  Administered 2019-08-31: 12:00:00 1500 mg via INTRAVENOUS
  Filled 2019-08-30 (×2): qty 300

## 2019-08-31 ENCOUNTER — Ambulatory Visit (HOSPITAL_COMMUNITY): Payer: No Typology Code available for payment source

## 2019-08-31 ENCOUNTER — Ambulatory Visit (HOSPITAL_COMMUNITY): Payer: No Typology Code available for payment source | Admitting: Vascular Surgery

## 2019-08-31 ENCOUNTER — Encounter (HOSPITAL_COMMUNITY): Payer: Self-pay | Admitting: Orthopaedic Surgery

## 2019-08-31 ENCOUNTER — Encounter (HOSPITAL_COMMUNITY)
Admission: RE | Disposition: A | Discharge status: Home / Self Care | Payer: Self-pay | Source: Home / Self Care | Attending: Orthopaedic Surgery

## 2019-08-31 ENCOUNTER — Ambulatory Visit (HOSPITAL_COMMUNITY): Payer: No Typology Code available for payment source | Admitting: Certified Registered Nurse Anesthetist

## 2019-08-31 ENCOUNTER — Observation Stay (HOSPITAL_COMMUNITY): Payer: No Typology Code available for payment source

## 2019-08-31 ENCOUNTER — Other Ambulatory Visit: Payer: Self-pay

## 2019-08-31 ENCOUNTER — Observation Stay (HOSPITAL_COMMUNITY)
Admission: RE | Admit: 2019-08-31 | Discharge: 2019-09-02 | Disposition: A | Discharge status: Home / Self Care | Payer: No Typology Code available for payment source | Attending: Orthopaedic Surgery | Admitting: Orthopaedic Surgery

## 2019-08-31 DIAGNOSIS — I10 Essential (primary) hypertension: Secondary | ICD-10-CM | POA: Diagnosis not present

## 2019-08-31 DIAGNOSIS — F17210 Nicotine dependence, cigarettes, uncomplicated: Secondary | ICD-10-CM | POA: Insufficient documentation

## 2019-08-31 DIAGNOSIS — M25552 Pain in left hip: Secondary | ICD-10-CM | POA: Diagnosis present

## 2019-08-31 DIAGNOSIS — M1612 Unilateral primary osteoarthritis, left hip: Secondary | ICD-10-CM | POA: Diagnosis not present

## 2019-08-31 DIAGNOSIS — Z8673 Personal history of transient ischemic attack (TIA), and cerebral infarction without residual deficits: Secondary | ICD-10-CM | POA: Insufficient documentation

## 2019-08-31 DIAGNOSIS — I951 Orthostatic hypotension: Secondary | ICD-10-CM | POA: Diagnosis not present

## 2019-08-31 DIAGNOSIS — Z419 Encounter for procedure for purposes other than remedying health state, unspecified: Secondary | ICD-10-CM

## 2019-08-31 DIAGNOSIS — Z09 Encounter for follow-up examination after completed treatment for conditions other than malignant neoplasm: Secondary | ICD-10-CM

## 2019-08-31 HISTORY — PX: TOTAL HIP ARTHROPLASTY: SHX124

## 2019-08-31 SURGERY — ARTHROPLASTY, HIP, TOTAL, ANTERIOR APPROACH
Anesthesia: Monitor Anesthesia Care | Site: Hip | Laterality: Left

## 2019-08-31 MED ORDER — MIDAZOLAM HCL 2 MG/2ML IJ SOLN
INTRAMUSCULAR | Status: AC
  Filled 2019-08-31: qty 2

## 2019-08-31 MED ORDER — DIPHENHYDRAMINE HCL 50 MG/ML IJ SOLN
12.5000 mg | Freq: Once | INTRAMUSCULAR | Status: AC
  Administered 2019-08-31: 12.5 mg via INTRAVENOUS

## 2019-08-31 MED ORDER — EPINEPHRINE PF 1 MG/ML IJ SOLN
INTRAMUSCULAR | Status: AC
  Filled 2019-08-31: qty 1

## 2019-08-31 MED ORDER — CHLORHEXIDINE GLUCONATE 4 % EX LIQD: 60.0000 mL | Freq: Once | CUTANEOUS | Status: DC

## 2019-08-31 MED ORDER — LACTATED RINGERS IV SOLN: INTRAVENOUS | Status: DC | PRN

## 2019-08-31 MED ORDER — TRANEXAMIC ACID-NACL 1000-0.7 MG/100ML-% IV SOLN
INTRAVENOUS | Status: DC | PRN
  Administered 2019-08-31: 1000 mg via INTRAVENOUS

## 2019-08-31 MED ORDER — SODIUM CHLORIDE 0.9 % IV SOLN
INTRAVENOUS | Status: DC | PRN
  Administered 2019-08-31: 40 ug/min via INTRAVENOUS

## 2019-08-31 MED ORDER — MIDAZOLAM HCL 5 MG/5ML IJ SOLN
INTRAMUSCULAR | Status: DC | PRN
  Administered 2019-08-31 (×2): 1 mg via INTRAVENOUS

## 2019-08-31 MED ORDER — METHOCARBAMOL 500 MG PO TABS
500.0000 mg | ORAL_TABLET | Freq: Four times a day (QID) | ORAL | Status: DC | PRN
  Administered 2019-08-31 – 2019-09-02 (×5): 500 mg via ORAL
  Filled 2019-08-31 (×5): qty 1

## 2019-08-31 MED ORDER — HYDROMORPHONE HCL 1 MG/ML IJ SOLN
0.5000 mg | INTRAMUSCULAR | Status: DC | PRN
  Administered 2019-08-31: 0.5 mg via INTRAVENOUS
  Filled 2019-08-31: qty 1

## 2019-08-31 MED ORDER — SERTRALINE HCL 100 MG PO TABS
100.0000 mg | ORAL_TABLET | Freq: Every day | ORAL | Status: DC
  Administered 2019-09-01 – 2019-09-02 (×2): 100 mg via ORAL
  Filled 2019-08-31 (×2): qty 1

## 2019-08-31 MED ORDER — TRANEXAMIC ACID 1000 MG/10ML IV SOLN: INTRAVENOUS | Status: DC | PRN

## 2019-08-31 MED ORDER — PROPOFOL 500 MG/50ML IV EMUL
INTRAVENOUS | Status: DC | PRN
  Administered 2019-08-31: 100 ug/kg/min via INTRAVENOUS

## 2019-08-31 MED ORDER — FENTANYL CITRATE (PF) 100 MCG/2ML IJ SOLN
INTRAMUSCULAR | Status: DC | PRN
  Administered 2019-08-31: 50 ug via INTRAVENOUS

## 2019-08-31 MED ORDER — ACETAMINOPHEN 325 MG PO TABS
325.0000 mg | ORAL_TABLET | Freq: Four times a day (QID) | ORAL | Status: DC | PRN
  Administered 2019-09-01: 650 mg via ORAL
  Filled 2019-08-31: qty 2

## 2019-08-31 MED ORDER — SODIUM CHLORIDE 0.9 % IV SOLN
INTRAVENOUS | Status: DC | PRN
  Administered 2019-08-31 (×5): 80 ug via INTRAVENOUS

## 2019-08-31 MED ORDER — DIPHENHYDRAMINE HCL 50 MG/ML IJ SOLN
INTRAMUSCULAR | Status: AC
  Filled 2019-08-31: qty 1

## 2019-08-31 MED ORDER — MEPERIDINE HCL 25 MG/ML IJ SOLN: 6.2500 mg | INTRAMUSCULAR | Status: DC | PRN

## 2019-08-31 MED ORDER — FENTANYL CITRATE (PF) 100 MCG/2ML IJ SOLN
25.0000 ug | INTRAMUSCULAR | Status: DC | PRN
  Administered 2019-08-31: 25 ug via INTRAVENOUS
  Administered 2019-08-31: 50 ug via INTRAVENOUS
  Administered 2019-08-31: 25 ug via INTRAVENOUS

## 2019-08-31 MED ORDER — DEXAMETHASONE SODIUM PHOSPHATE 10 MG/ML IJ SOLN
INTRAMUSCULAR | Status: DC | PRN
  Administered 2019-08-31: 10 mg via INTRAVENOUS

## 2019-08-31 MED ORDER — LIDOCAINE HCL (CARDIAC) PF 100 MG/5ML IV SOSY
PREFILLED_SYRINGE | INTRAVENOUS | Status: DC | PRN
  Administered 2019-08-31: 20 mg via INTRAVENOUS

## 2019-08-31 MED ORDER — ASPIRIN EC 325 MG PO TBEC
325.0000 mg | DELAYED_RELEASE_TABLET | Freq: Every day | ORAL | Status: DC
  Administered 2019-09-01 – 2019-09-02 (×2): 325 mg via ORAL
  Filled 2019-08-31 (×2): qty 1

## 2019-08-31 MED ORDER — BUPIVACAINE LIPOSOME 1.3 % IJ SUSP
20.0000 mL | INTRAMUSCULAR | Status: AC
  Administered 2019-08-31: 20 mL
  Filled 2019-08-31: qty 20

## 2019-08-31 MED ORDER — LACTATED RINGERS IV SOLN: INTRAVENOUS | Status: DC

## 2019-08-31 MED ORDER — POLYETHYLENE GLYCOL 3350 17 G PO PACK: 17.0000 g | PACK | Freq: Every day | ORAL | Status: DC | PRN

## 2019-08-31 MED ORDER — TRAZODONE HCL 100 MG PO TABS
200.0000 mg | ORAL_TABLET | Freq: Every day | ORAL | Status: DC
  Administered 2019-08-31 – 2019-09-01 (×2): 200 mg via ORAL
  Filled 2019-08-31 (×3): qty 2

## 2019-08-31 MED ORDER — FENTANYL CITRATE (PF) 100 MCG/2ML IJ SOLN
INTRAMUSCULAR | Status: AC
  Filled 2019-08-31: qty 2

## 2019-08-31 MED ORDER — ALBUMIN HUMAN 5 % IV SOLN
INTRAVENOUS | Status: AC
  Filled 2019-08-31: qty 250

## 2019-08-31 MED ORDER — PROPOFOL 10 MG/ML IV BOLUS
INTRAVENOUS | Status: AC
  Filled 2019-08-31: qty 20

## 2019-08-31 MED ORDER — ONDANSETRON HCL 4 MG/2ML IJ SOLN: 4.0000 mg | Freq: Once | INTRAMUSCULAR | Status: DC | PRN

## 2019-08-31 MED ORDER — AMLODIPINE BESYLATE 5 MG PO TABS
5.0000 mg | ORAL_TABLET | Freq: Every day | ORAL | Status: DC
  Administered 2019-09-02: 5 mg via ORAL
  Filled 2019-08-31 (×2): qty 1

## 2019-08-31 MED ORDER — EPHEDRINE SULFATE 50 MG/ML IJ SOLN
INTRAMUSCULAR | Status: DC | PRN
  Administered 2019-08-31: 10 mg via INTRAVENOUS
  Administered 2019-08-31: 5 mg via INTRAVENOUS
  Administered 2019-08-31: 10 mg via INTRAVENOUS
  Administered 2019-08-31: 5 mg via INTRAVENOUS
  Administered 2019-08-31 (×2): 10 mg via INTRAVENOUS

## 2019-08-31 MED ORDER — METOCLOPRAMIDE HCL 5 MG/ML IJ SOLN: 5.0000 mg | Freq: Three times a day (TID) | INTRAMUSCULAR | Status: DC | PRN

## 2019-08-31 MED ORDER — METOCLOPRAMIDE HCL 5 MG PO TABS: 5.0000 mg | ORAL_TABLET | Freq: Three times a day (TID) | ORAL | Status: DC | PRN

## 2019-08-31 MED ORDER — ALBUTEROL SULFATE (2.5 MG/3ML) 0.083% IN NEBU: 2.5000 mg | INHALATION_SOLUTION | Freq: Four times a day (QID) | RESPIRATORY_TRACT | Status: DC | PRN

## 2019-08-31 MED ORDER — 0.9 % SODIUM CHLORIDE (POUR BTL) OPTIME
TOPICAL | Status: DC | PRN
  Administered 2019-08-31: 1000 mL

## 2019-08-31 MED ORDER — BUPIVACAINE HCL (PF) 0.25 % IJ SOLN
INTRAMUSCULAR | Status: DC | PRN
  Administered 2019-08-31: 20 mL

## 2019-08-31 MED ORDER — BUPIVACAINE IN DEXTROSE 0.75-8.25 % IT SOLN
INTRATHECAL | Status: DC | PRN
  Administered 2019-08-31: 2 mL via INTRATHECAL

## 2019-08-31 MED ORDER — PHENOL 1.4 % MT LIQD: 1.0000 | OROMUCOSAL | Status: DC | PRN

## 2019-08-31 MED ORDER — BUPIVACAINE HCL (PF) 0.25 % IJ SOLN
INTRAMUSCULAR | Status: AC
  Filled 2019-08-31: qty 30

## 2019-08-31 MED ORDER — TRANEXAMIC ACID-NACL 1000-0.7 MG/100ML-% IV SOLN
INTRAVENOUS | Status: AC
  Filled 2019-08-31: qty 100

## 2019-08-31 MED ORDER — FENTANYL CITRATE (PF) 250 MCG/5ML IJ SOLN
INTRAMUSCULAR | Status: AC
  Filled 2019-08-31: qty 5

## 2019-08-31 MED ORDER — VANCOMYCIN HCL 1000 MG IV SOLR
INTRAVENOUS | Status: DC | PRN
  Administered 2019-08-31: 1500 mg via INTRAVENOUS

## 2019-08-31 MED ORDER — ONDANSETRON HCL 4 MG PO TABS: 4.0000 mg | ORAL_TABLET | Freq: Four times a day (QID) | ORAL | Status: DC | PRN

## 2019-08-31 MED ORDER — METHOCARBAMOL 1000 MG/10ML IJ SOLN
500.0000 mg | Freq: Four times a day (QID) | INTRAVENOUS | Status: DC | PRN
  Filled 2019-08-31: qty 5

## 2019-08-31 MED ORDER — ALBUTEROL SULFATE HFA 108 (90 BASE) MCG/ACT IN AERS: 2.0000 | INHALATION_SPRAY | Freq: Four times a day (QID) | RESPIRATORY_TRACT | Status: DC | PRN

## 2019-08-31 MED ORDER — OXYCODONE HCL 5 MG PO TABS
5.0000 mg | ORAL_TABLET | ORAL | Status: DC | PRN
  Administered 2019-08-31: 10 mg via ORAL
  Administered 2019-08-31: 5 mg via ORAL
  Administered 2019-09-01 – 2019-09-02 (×7): 10 mg via ORAL
  Filled 2019-08-31 (×4): qty 2
  Filled 2019-08-31: qty 1
  Filled 2019-08-31 (×4): qty 2

## 2019-08-31 MED ORDER — SODIUM CHLORIDE 0.9 % IV SOLN: INTRAVENOUS | Status: DC

## 2019-08-31 MED ORDER — LORATADINE 10 MG PO TABS: 10.0000 mg | ORAL_TABLET | Freq: Every day | ORAL | Status: DC

## 2019-08-31 MED ORDER — METOPROLOL SUCCINATE ER 25 MG PO TB24
25.0000 mg | ORAL_TABLET | Freq: Every day | ORAL | Status: DC
  Administered 2019-09-02: 25 mg via ORAL
  Filled 2019-08-31 (×2): qty 1

## 2019-08-31 MED ORDER — ALBUMIN HUMAN 5 % IV SOLN
12.5000 g | Freq: Once | INTRAVENOUS | Status: AC
  Administered 2019-08-31: 12.5 g via INTRAVENOUS

## 2019-08-31 MED ORDER — MENTHOL 3 MG MT LOZG: 1.0000 | LOZENGE | OROMUCOSAL | Status: DC | PRN

## 2019-08-31 MED ORDER — ONDANSETRON HCL 4 MG/2ML IJ SOLN
4.0000 mg | Freq: Four times a day (QID) | INTRAMUSCULAR | Status: DC | PRN
  Administered 2019-09-01: 4 mg via INTRAVENOUS
  Filled 2019-08-31: qty 2

## 2019-08-31 MED ORDER — DOCUSATE SODIUM 100 MG PO CAPS
100.0000 mg | ORAL_CAPSULE | Freq: Two times a day (BID) | ORAL | Status: DC
  Administered 2019-08-31 – 2019-09-02 (×4): 100 mg via ORAL
  Filled 2019-08-31 (×4): qty 1

## 2019-08-31 MED ORDER — PROPOFOL 10 MG/ML IV BOLUS
INTRAVENOUS | Status: DC | PRN
  Administered 2019-08-31: 20 mg via INTRAVENOUS
  Administered 2019-08-31: 40 mg via INTRAVENOUS

## 2019-08-31 SURGICAL SUPPLY — 52 items
BENZOIN TINCTURE PRP APPL 2/3 (GAUZE/BANDAGES/DRESSINGS) ×2 IMPLANT
BLADE CLIPPER SURG (BLADE) IMPLANT
BLADE SAW SGTL 18X1.27X75 (BLADE) ×2 IMPLANT
CELLS DAT CNTRL 66122 CELL SVR (MISCELLANEOUS) ×1 IMPLANT
COVER SURGICAL LIGHT HANDLE (MISCELLANEOUS) ×2 IMPLANT
COVER WAND RF STERILE (DRAPES) ×2 IMPLANT
CUP ACETBLR 52 OD 100 SERIES (Hips) ×2 IMPLANT
DRAPE C-ARM 42X72 X-RAY (DRAPES) ×2 IMPLANT
DRAPE IMP U-DRAPE 54X76 (DRAPES) ×2 IMPLANT
DRAPE STERI IOBAN 125X83 (DRAPES) ×2 IMPLANT
DRAPE U-SHAPE 47X51 STRL (DRAPES) ×6 IMPLANT
DRSG MEPILEX BORDER 4X12 (GAUZE/BANDAGES/DRESSINGS) ×2 IMPLANT
DRSG MEPILEX BORDER 4X8 (GAUZE/BANDAGES/DRESSINGS) IMPLANT
DURAPREP 26ML APPLICATOR (WOUND CARE) ×2 IMPLANT
ELECT BLADE 4.0 EZ CLEAN MEGAD (MISCELLANEOUS) ×2
ELECT CAUTERY BLADE 6.4 (BLADE) ×2 IMPLANT
ELECT REM PT RETURN 9FT ADLT (ELECTROSURGICAL) ×2
ELECTRODE BLDE 4.0 EZ CLN MEGD (MISCELLANEOUS) ×1 IMPLANT
ELECTRODE REM PT RTRN 9FT ADLT (ELECTROSURGICAL) ×1 IMPLANT
ELIMINATOR HOLE APEX DEPUY (Hips) ×2 IMPLANT
FACESHIELD WRAPAROUND (MASK) ×6 IMPLANT
GLOVE BIOGEL PI IND STRL 8 (GLOVE) ×2 IMPLANT
GLOVE BIOGEL PI INDICATOR 8 (GLOVE) ×2
GLOVE ORTHO TXT STRL SZ7.5 (GLOVE) ×10 IMPLANT
GOWN STRL REUS W/ TWL LRG LVL3 (GOWN DISPOSABLE) ×1 IMPLANT
GOWN STRL REUS W/ TWL XL LVL3 (GOWN DISPOSABLE) ×1 IMPLANT
GOWN STRL REUS W/TWL 2XL LVL3 (GOWN DISPOSABLE) ×2 IMPLANT
GOWN STRL REUS W/TWL LRG LVL3 (GOWN DISPOSABLE) ×1
GOWN STRL REUS W/TWL XL LVL3 (GOWN DISPOSABLE) ×1
HEAD CERAMIC DELTA 36 PLUS 1.5 (Hips) ×2 IMPLANT
KIT BASIN OR (CUSTOM PROCEDURE TRAY) ×2 IMPLANT
KIT TURNOVER KIT B (KITS) ×2 IMPLANT
LINER NEUTRAL 52X36MM PLUS 4 (Liner) ×2 IMPLANT
MANIFOLD NEPTUNE II (INSTRUMENTS) ×2 IMPLANT
NS IRRIG 1000ML POUR BTL (IV SOLUTION) ×2 IMPLANT
PACK TOTAL JOINT (CUSTOM PROCEDURE TRAY) ×2 IMPLANT
PAD ARMBOARD 7.5X6 YLW CONV (MISCELLANEOUS) ×4 IMPLANT
RTRCTR WOUND ALEXIS 18CM MED (MISCELLANEOUS) ×2
STAPLER VISISTAT 35W (STAPLE) ×2 IMPLANT
STEM FEM ACTIS STD SZ2 (Stem) ×2 IMPLANT
STRIP CLOSURE SKIN 1/2X4 (GAUZE/BANDAGES/DRESSINGS) ×2 IMPLANT
SUT VIC AB 0 CT1 27 (SUTURE) ×1
SUT VIC AB 0 CT1 27XBRD ANBCTR (SUTURE) ×1 IMPLANT
SUT VIC AB 2-0 CT1 27 (SUTURE) ×1
SUT VIC AB 2-0 CT1 TAPERPNT 27 (SUTURE) ×1 IMPLANT
SUT VICRYL 4-0 PS2 18IN ABS (SUTURE) ×2 IMPLANT
SUT VLOC 180 0 24IN GS25 (SUTURE) ×2 IMPLANT
TOWEL GREEN STERILE (TOWEL DISPOSABLE) ×4 IMPLANT
TOWEL GREEN STERILE FF (TOWEL DISPOSABLE) ×2 IMPLANT
TRAY CATH 16FR W/PLASTIC CATH (SET/KITS/TRAYS/PACK) IMPLANT
TRAY FOLEY MTR SLVR 16FR STAT (SET/KITS/TRAYS/PACK) IMPLANT
WATER STERILE IRR 1000ML POUR (IV SOLUTION) ×2 IMPLANT

## 2019-08-31 NOTE — Anesthesia Procedure Notes (Signed)
Spinal  Patient location during procedure: OR Start time: 08/31/2019 1:05 PM End time: 08/31/2019 1:10 PM Staffing Performed: anesthesiologist  Anesthesiologist: Val Eagle, MD Preanesthetic Checklist Completed: patient identified, IV checked, risks and benefits discussed, surgical consent, monitors and equipment checked, pre-op evaluation and timeout performed Spinal Block Patient position: sitting Prep: DuraPrep Patient monitoring: heart rate, cardiac monitor, continuous pulse ox and blood pressure Approach: midline Location: L4-5 Injection technique: single-shot Needle Needle type: Pencan  Needle gauge: 24 G Needle length: 9 cm Assessment Sensory level: T6

## 2019-08-31 NOTE — Plan of Care (Signed)
  Problem: Education: Goal: Knowledge of General Education information will improve Description: Including pain rating scale, medication(s)/side effects and non-pharmacologic comfort measures Outcome: Progressing   Problem: Clinical Measurements: Goal: Respiratory complications will improve Outcome: Progressing Note: On room air, continuous pulse ox in place   Problem: Nutrition: Goal: Adequate nutrition will be maintained Outcome: Progressing   Problem: Coping: Goal: Level of anxiety will decrease Outcome: Progressing   Problem: Pain Managment: Goal: General experience of comfort will improve Outcome: Progressing Note: Treated with robaxin and oxycodone once left hip pain

## 2019-08-31 NOTE — Transfer of Care (Signed)
Immediate Anesthesia Transfer of Care Note  Patient: Sydney [name]  Procedure(s) Performed: LEFT TOTAL HIP ARTHROPLASTY -DIRECT ANTERIOR (Left Hip)  Patient Location: PACU  Anesthesia Type:Spinal  Level of Consciousness: awake, alert  and oriented  Airway & Oxygen Therapy: Patient Spontanous Breathing  Post-op Assessment: Report given to RN and Post -op Vital signs reviewed and stable  Post vital signs: Reviewed and stable  Last Vitals:  Vitals Value Taken Time  BP 126/110 08/31/19 1528  Temp    Pulse 66 08/31/19 1529  Resp 16 08/31/19 1529  SpO2 98 % 08/31/19 1529  Vitals shown include unvalidated device data.  Last Pain:  Vitals:   08/31/19 1101  TempSrc:   PainSc: 3       Patients Stated Pain Goal: 3 (08/31/19 1101)  Complications: No apparent anesthesia complications

## 2019-08-31 NOTE — Op Note (Signed)
Preop diagnosis: Left hip primary osteoarthritis  Postop diagnosis: Same  Procedure: Left total hip arthroplasty direct anterior approach.  Surgeon: Annell Greening, MD  Assistant: Zonia Kief, PA-C medically necessary and present entire procedure  Anesthesia spinal plus Marcaine and Exparel local at end of case  EBL: 200 cc  Implants:Depuy Pinnacle 52 mm Gripton cup.  +4 neutral liner 52 x 36 mm.  +1.5 neck length ceramic ball.  Size 2 Actis femoral stem.  Procedure: After induction of spinal anesthesia placement of Hana table on boots careful padding positioning C arm was brought in visualization of both hips.  1015 drapes were applied above and below prepping with DuraPrep timeout procedure completed with a DuraPrep was drying large sharp curtain Betadine Steri-Drape and 6 sheets across the top opposite side.  Always got hydraulic arm attached sealed at the base with a piece of Betadine Ioban.  Vancomycin was given IV due to patient's penicillin allergy.  Timeout procedure completed.  Incision was made 2 cm lateral 2 cm inferior to the ASIS obliquely to the trochanter between the 2 primary tattoos.  Thick subtenons tissue was divided fascia was split in line with the fibers elevated and pulmonary tractor was placed medially over the capsule.  Transverse bleeders were coagulated.  Subcutaneous fat was excised capsule was opened gush of clear serous fluid was noted from the joint and dull cobra was placed inside the capsule medially.  Continued the anterior capsule resection and then another cobra placed superior lateral.  C arm was brought in next cut under C-arm visualization.  Superior lateral aspect was divided with an osteotome taken the trochanter.  Due to the patient's body habitus with we had to place a hydraulic hook and move the leg down and under after external rotation 110 degrees and then bring her back up for first order further capsule resection posteriorly just dividing the capsule  loosening it up getting little bit more proximal fascia so the femur could be appropriately delivered for preparation of the canal.  Prior to placing the femur I stem was prepared with sequential reaming.  90 degree Hohmann placed anterior capsule progression up to 51 placement of 52.  Cup would not completely sit down we removed it touched up with 51 reamer again in place 52 gripton cup in place impacted it under C-arm visualization until it was directly down to bone.  Good cuff abduction and cup flexion was noted.  Hydraulic arm was placed leg was taken down and under and sequential preparation the canal was performed with the cookie cutter broach lateralization with rongeurs and Curettes.  Mueller retractor placed medially.  Progressed up to #2 stem which gave a tight fit and was trialed with a +1.5 neck there was identical leg lengths good stability.  It filled the canal nicely so a permanent #2 Actis stem was placed calcar did not need to be reamed.  Collar was flush with the calcar.  Ceramic ball was placed popped on hip was reduced.  Identical findings of stability equal leg lengths.  Hip was taken and external rotation 90 and extended to 45 degrees going down toward the floor and hip was stable there was only trace shock.  Repeat looking for any transverse bleeders operative field was dry repeat irrigation.  Infiltration with Marcaine and Exparel.  V lock closure subtendinous tissue reapproximation skin staple closure postop dressing and transferred recovery room.

## 2019-08-31 NOTE — Interval H&P Note (Signed)
History and Physical Interval Note:  08/31/2019 12:17 PM  Sydney [name]  has presented today for surgery, with the diagnosis of left hip osteoarthritis.  The various methods of treatment have been discussed with the patient and family. After consideration of risks, benefits and other options for treatment, the patient has consented to  Procedure(s): LEFT TOTAL HIP ARTHROPLASTY -DIRECT ANTERIOR (Left) as a surgical intervention.  The patient's history has been reviewed, patient examined, no change in status, stable for surgery.  I have reviewed the patient's chart and labs.  Questions were answered to the patient's satisfaction.     Sydney [name]

## 2019-08-31 NOTE — H&P (Signed)
TOTAL HIP ADMISSION H&P  Patient is admitted for left total hip arthroplasty.  Subjective:  Chief Complaint: left hip pain  HPI: Sydney [name], 59 y.o. female, has a history of pain and functional disability in the left hip(s) due to arthritis and patient has failed non-surgical conservative treatments for greater than 12 weeks to include NSAID's and/or analgesics and use of assistive devices.  Onset of symptoms was gradual starting 10 years ago with gradually worsening course since that time.The patient noted no past surgery on the left hip(s).  Patient currently rates pain in the left hip at 10 out of 10 with activity. Patient has night pain, worsening of pain with activity and weight bearing, trendelenberg gait and pain that interfers with activities of daily living. Patient has evidence of subchondral cysts, subchondral sclerosis, periarticular osteophytes and joint space narrowing by imaging studies. This condition presents safety issues increasing the risk of falls.  There is no current active infection.  Patient Active Problem List   Diagnosis Date Noted  . Unilateral primary osteoarthritis, left hip 07/28/2019  . Back pain 07/01/2018  . DDD (degenerative disc disease), lumbosacral 07/01/2018  . Venous insufficiency of both lower extremities 11/17/2016  . 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 Morbid obesity with BMI of 40.0-44.9, adult (HCC) 07/28/2016  . Chronic pain syndrome 10/24/2015  . Dysfunction of both eustachian tubes 07/26/2014  . Calculus of left kidney 04/25/2014  . Impaired fasting glucose 04/05/2014  . History of bladder surgery 04/03/2014  . History of hysterectomy 04/03/2014  . Urge incontinence of urine 04/03/2014  . Asthma 07/28/2013  . Nontoxic uninodular goiter 07/28/2013  . Solitary pulmonary nodule 07/28/2013  . Lumbar radiculopathy 04/20/2013  . Insomnia 03/22/2013  . Essential hypertension 08/18/2011  . Tobacco use  disorder 08/18/2011  . Moderate episode of recurrent major depressive disorder (HCC) 04/03/2011  . Other B-complex deficiencies 08/16/2010  . Family history of cerebrovascular accident (CVA) 04/17/2010  . Family history of malignant neoplasm of gastrointestinal tract 04/17/2010  . Family history of other cardiovascular diseases(V17.49) 04/17/2010   Past Medical History:  Diagnosis Date  . Allergy   . Anemia    history of  . Anxiety   . Arthritis   . Asthma   . Atherosclerosis   . Depression   . Family history of adverse reaction to anesthesia    pt's mother felt everything and couldn't speak during a gallbladder surgery  . GERD (gastroesophageal reflux disease)   . History of hiatal hernia   . History of kidney stones   . Hypertension   . Pneumonia    a few times  . TIA (transient ischemic attack) 2012    Past Surgical History:  Procedure Laterality Date  . ABDOMINAL HYSTERECTOMY     abdominal  . APPENDECTOMY    . bladder tack    . CARPAL TUNNEL RELEASE Bilateral   . CERVICAL SPINE SURGERY     5-6, 6-7  . CHOLECYSTECTOMY    . HAMMER TOE SURGERY Right     Current Facility-Administered Medications  Medication Dose Route Frequency Provider Last Rate Last Admin  . chlorhexidine (HIBICLENS) 4 % liquid 4 application  60 mL Topical Once Naida Sleight, PA-C      . lactated ringers infusion   Intravenous Continuous Val Eagle, MD 10 mL/hr at 08/31/19 1114 New Bag at 08/31/19 1114  . vancomycin (VANCOREADY) IVPB 1500 mg/300 mL  1,500 mg Intravenous To SS-Surg Eldred Manges, MD 150 mL/hr  at 08/31/19 1133 1,500 mg at 08/31/19 1133   Allergies  Allergen Reactions  . Bee Pollen Anaphylaxis and Swelling  . Bee Venom Anaphylaxis and Swelling  . Butorphanol Other (See Comments)    Not sure told by md she was allergic after surgery.   . Meloxicam Anxiety    Mood disorder Altered her personality   . Penicillins Anaphylaxis and Hives    Unable to Recall As child mom  was told she is highly allergic Did it involve swelling of the face/tongue/throat, SOB, or low BP? Unknown Did it involve sudden or severe rash/hives, skin peeling, or any reaction on the inside of your mouth or nose? Unknown Did you need to seek medical attention at a hospital or doctor's office? Unknown When did it last happen?childhood allergy If all above answers are "NO", may proceed with cephalosporin use.   Marland Kitchen Prochlorperazine Edisylate Anaphylaxis  . Sulfa Antibiotics Anaphylaxis    Unable to Recall  . Tramadol Anxiety    Didn't like the way it made her feel   . Topiramate Nausea And Vomiting    Social History   Tobacco Use  . Smoking status: Current Every Day Smoker    Packs/day: 0.50    Years: 23.00    Pack years: 11.50    Types: Cigarettes  . Smokeless tobacco: Never Used  Substance Use Topics  . Alcohol use: Yes    Comment: occ    Family History  Problem Relation Age of Onset  . Anxiety disorder Mother   . Depression Mother   . Heart disease Mother   . Hypertension Mother   . Arrhythmia Mother   . Cancer Father   . Lung cancer Father   . Migraines Sister   . Alcohol abuse Brother   . Cancer Daughter        nerve sheath sarcoma     Review of Systems  Constitutional: Positive for activity change.  HENT: Negative.   Respiratory: Negative.   Cardiovascular: Negative.   Gastrointestinal: Negative.   Genitourinary: Negative.   Musculoskeletal: Positive for gait problem.  Skin: Negative.   Psychiatric/Behavioral: Negative.     Objective:  Physical Exam  Constitutional: She is oriented to person, place, and time. She appears well-developed. No distress.  HENT:  Head: Normocephalic and atraumatic.  Eyes: Pupils are equal, round, and reactive to light. EOM are normal.  Cardiovascular: Normal heart sounds.  Respiratory: Effort normal and breath sounds normal. No respiratory distress.  GI: Bowel sounds are normal. She exhibits no distension. There  is no abdominal tenderness.  Musculoskeletal:        General: Tenderness present.     Cervical back: Normal range of motion.  Neurological: She is alert and oriented to person, place, and time.  Skin: Skin is warm and dry.  Psychiatric: She has a normal mood and affect.    Vital signs in last 24 hours: Temp:  [98.1 F (36.7 C)] 98.1 F (36.7 C) (02/10 1037) Pulse Rate:  [79] 79 (02/10 1037) Resp:  [18] 18 (02/10 1037) BP: (151)/(75) 151/75 (02/10 1037) SpO2:  [98 %] 98 % (02/10 1037) Weight:  [107 kg] 107 kg (02/10 1037)  Labs:   Estimated body mass index is 38.66 kg/m as calculated from the following:   Height as of this encounter: 5' 5.5" (1.664 m).   Weight as of this encounter: 107 kg.   Imaging Review Plain radiographs demonstrate moderate degenerative joint disease of the left hip(s). The bone quality appears to  be good for age and reported activity level.      Assessment/Plan:  End stage arthritis, left hip(s)  The patient history, physical examination, clinical judgement of the provider and imaging studies are consistent with end stage degenerative joint disease of the left hip(s) and total hip arthroplasty is deemed medically necessary. The treatment options including medical management, injection therapy, arthroscopy and arthroplasty were discussed at length. The risks and benefits of total hip arthroplasty were presented and reviewed. The risks due to aseptic loosening, infection, stiffness, dislocation/subluxation,  thromboembolic complications and other imponderables were discussed.  The patient acknowledged the explanation, agreed to proceed with the plan and consent was signed. Patient is being admitted for inpatient treatment for surgery, pain control, PT, OT, prophylactic antibiotics, VTE prophylaxis, progressive ambulation and ADL's and discharge planning.The patient is planning to be discharged home with home health services

## 2019-08-31 NOTE — Anesthesia Preprocedure Evaluation (Signed)
Anesthesia Evaluation  Patient identified by MRN, date of birth, ID band Patient awake    Reviewed: Allergy & Precautions, NPO status , Patient's Chart, lab work & pertinent test results, reviewed documented beta blocker date and time   History of Anesthesia Complications Negative for: history of anesthetic complications  Airway Mallampati: III  TM Distance: >3 FB Neck ROM: Full    Dental  (+) Dental Advisory Given   Pulmonary asthma , Current Smoker and Patient abstained from smoking.,    breath sounds clear to auscultation       Cardiovascular hypertension, Pt. on medications and Pt. on home beta blockers  Rhythm:Regular     Neuro/Psych PSYCHIATRIC DISORDERS Anxiety Depression TIA Neuromuscular disease    GI/Hepatic Neg liver ROS, hiatal hernia, GERD  Controlled,  Endo/Other  negative endocrine ROS  Renal/GU Renal disease     Musculoskeletal  (+) Arthritis ,   Abdominal   Peds  Hematology   Anesthesia Other Findings   Reproductive/Obstetrics                             Anesthesia Physical Anesthesia Plan  ASA: II  Anesthesia Plan: MAC and Spinal   Post-op Pain Management:    Induction: Intravenous  PONV Risk Score and Plan: 1 and Treatment may vary due to age or medical condition and Propofol infusion  Airway Management Planned: Nasal Cannula  Additional Equipment: None  Intra-op Plan:   Post-operative Plan:   Informed Consent: I have reviewed the patients History and Physical, chart, labs and discussed the procedure including the risks, benefits and alternatives for the proposed anesthesia with the patient or authorized representative who has indicated his/her understanding and acceptance.     Dental advisory given  Plan Discussed with: CRNA and Surgeon  Anesthesia Plan Comments:         Anesthesia Quick Evaluation

## 2019-09-01 DIAGNOSIS — M1612 Unilateral primary osteoarthritis, left hip: Secondary | ICD-10-CM | POA: Diagnosis not present

## 2019-09-01 LAB — BASIC METABOLIC PANEL
Anion gap: 9 (ref 5–15)
BUN: 8 mg/dL (ref 6–20)
CO2: 23 mmol/L (ref 22–32)
Calcium: 8.6 mg/dL — ABNORMAL LOW (ref 8.9–10.3)
Chloride: 104 mmol/L (ref 98–111)
Creatinine, Ser: 0.57 mg/dL (ref 0.44–1.00)
GFR calc Af Amer: >60 mL/min (ref >60)
GFR calc non Af Amer: >60 mL/min (ref >60)
Glucose, Bld: 139 mg/dL — ABNORMAL HIGH (ref 70–99)
Potassium: 4.7 mmol/L (ref 3.5–5.1)
Sodium: 136 mmol/L (ref 135–145)

## 2019-09-01 LAB — CBC
HCT: 39.3 % (ref 36.0–46.0)
Hemoglobin: 12.8 g/dL (ref 12.0–15.0)
MCH: 30 pg (ref 26.0–34.0)
MCHC: 32.6 g/dL (ref 30.0–36.0)
MCV: 92.3 fL (ref 80.0–100.0)
Platelets: 205 10*3/uL (ref 150–400)
RBC: 4.26 MIL/uL (ref 3.87–5.11)
RDW: 13.2 % (ref 11.5–15.5)
WBC: 9 10*3/uL (ref 4.0–10.5)
nRBC: 0 % (ref 0.0–0.2)

## 2019-09-01 MED ORDER — OXYCODONE-ACETAMINOPHEN 5-325 MG PO TABS: 2.0000 | ORAL_TABLET | Freq: Four times a day (QID) | ORAL | 0 refills | Status: DC | PRN

## 2019-09-01 MED ORDER — ASPIRIN 325 MG PO TBEC: 325.0000 mg | DELAYED_RELEASE_TABLET | Freq: Every day | ORAL | 0 refills | Status: DC

## 2019-09-01 MED ORDER — SODIUM CHLORIDE 0.9 % IV BOLUS
500.0000 mL | Freq: Once | INTRAVENOUS | Status: AC
  Administered 2019-09-01: 500 mL via INTRAVENOUS

## 2019-09-01 MED ORDER — METHOCARBAMOL 500 MG PO TABS: 500.0000 mg | ORAL_TABLET | Freq: Four times a day (QID) | ORAL | 0 refills | Status: DC | PRN

## 2019-09-01 NOTE — Discharge Instructions (Addendum)
INSTRUCTIONS AFTER JOINT REPLACEMENT   o Remove items at home which could result in a fall. This includes throw rugs or furniture in walking pathways o ICE to the affected joint every three hours while awake for 30 minutes at a time, for at least the first 3-5 days, and then as needed for pain and swelling.  Continue to use ice for pain and swelling. You may notice swelling that will progress down to the foot and ankle.  This is normal after surgery.  Elevate your leg when you are not up walking on it.   o Continue to use the breathing machine you got in the hospital (incentive spirometer) which will help keep your temperature down.  It is common for your temperature to cycle up and down following surgery, especially at night when you are not up moving around and exerting yourself.  The breathing machine keeps your lungs expanded and your temperature down.   DIET:  As you were doing prior to hospitalization, we recommend a well-balanced diet.  DRESSING / WOUND CARE / Shower You can leave your dressing on , it is waterproof. Dressing will be changed when you return to see Dr. Ophelia Charter one week after your surgery  ACTIVITY  o Increase activity slowly as tolerated, but follow the weight bearing instructions below.   o No driving for 6 weeks or until further direction given by your physician.  You cannot drive while taking narcotics.  o No lifting or carrying greater than 10 lbs. until further directed by your surgeon. o Avoid periods of inactivity such as sitting longer than an hour when not asleep. This helps prevent blood clots.  o You may return to work once you are authorized by your doctor.     WEIGHT BEARING   Weight bearing as tolerated with assist device (walker, cane, etc) as directed, use it as long as suggested by your surgeon or therapist, typically at least 4-6 weeks.   EXERCISES  Results after joint replacement surgery are often greatly improved when you follow the exercise,  range of motion and muscle strengthening exercises prescribed by your doctor. Safety measures are also important to protect the joint from further injury. Any time any of these exercises cause you to have increased pain or swelling, decrease what you are doing until you are comfortable again and then slowly increase them. If you have problems or questions, call your caregiver or physical therapist for advice.   Rehabilitation is important following a joint replacement. After just a few days of immobilization, the muscles of the leg can become weakened and shrink (atrophy).  These exercises are designed to build up the tone and strength of the thigh and leg muscles and to improve motion. Often times heat used for twenty to thirty minutes before working out will loosen up your tissues and help with improving the range of motion but do not use heat for the first two weeks following surgery (sometimes heat can increase post-operative swelling).   These exercises can be done on a training (exercise) mat, on the floor, on a table or on a bed. Use whatever works the best and is most comfortable for you.    Use music or television while you are exercising so that the exercises are a pleasant break in your day. This will make your life better with the exercises acting as a break in your routine that you can look forward to.   Perform all exercises about fifteen times, three times per day  or as directed.  You should exercise both the operative leg and the other leg as well.  Exercises include:   . Quad Sets - Tighten up the muscle on the front of the thigh (Quad) and hold for 5-10 seconds.   . Straight Leg Raises - With your knee straight (if you were given a brace, keep it on), lift the leg to 60 degrees, hold for 3 seconds, and slowly lower the leg.  Perform this exercise against resistance later as your leg gets stronger.  . Leg Slides: Lying on your back, slowly slide your foot toward your buttocks, bending your  knee up off the floor (only go as far as is comfortable). Then slowly slide your foot back down until your leg is flat on the floor again.  Glenard Haring Wings: Lying on your back spread your legs to the side as far apart as you can without causing discomfort.  . Hamstring Strength:  Lying on your back, push your heel against the floor with your leg straight by tightening up the muscles of your buttocks.  Repeat, but this time bend your knee to a comfortable angle, and push your heel against the floor.  You may put a pillow under the heel to make it more comfortable if necessary.   A rehabilitation program following joint replacement surgery can speed recovery and prevent re-injury in the future due to weakened muscles. Contact your doctor or a physical therapist for more information on knee rehabilitation.    CONSTIPATION  Constipation is defined medically as fewer than three stools per week and severe constipation as less than one stool per week.  Even if you have a regular bowel pattern at home, your normal regimen is likely to be disrupted due to multiple reasons following surgery.  Combination of anesthesia, postoperative narcotics, change in appetite and fluid intake all can affect your bowels.   YOU MUST use at least one of the following options; they are listed in order of increasing strength to get the job done.  They are all available over the counter, and you may need to use some, POSSIBLY even all of these options:    Drink plenty of fluids (prune juice may be helpful) and high fiber foods Colace 100 mg by mouth twice a day  Senokot for constipation as directed and as needed Dulcolax (bisacodyl), take with full glass of water  Miralax (polyethylene glycol) once or twice a day as needed.  If you have tried all these things and are unable to have a bowel movement in the first 3-4 days after surgery call either your surgeon or your primary doctor.    If you experience loose stools or diarrhea,  hold the medications until you stool forms back up.  If your symptoms do not get better within 1 week or if they get worse, check with your doctor.  If you experience "the worst abdominal pain ever" or develop nausea or vomiting, please contact the office immediately for further recommendations for treatment.   ITCHING:  If you experience itching with your medications, try taking only a single pain pill, or even half a pain pill at a time.  You can also use Benadryl over the counter for itching or also to help with sleep.   TED HOSE STOCKINGS:  Use stockings on both legs until for at least 2 weeks or as directed by physician office. They may be removed at night for sleeping.  MEDICATIONS:  See your medication summary on the "  After Visit Summary" that nursing will review with you.  You may have some home medications which will be placed on hold until you complete the course of blood thinner medication.  It is important for you to complete the blood thinner medication as prescribed.  PRECAUTIONS:  If you experience chest pain or shortness of breath - call 911 immediately for transfer to the hospital emergency department.   If you develop a fever greater that 101 F, purulent drainage from wound, increased redness or drainage from wound, foul odor from the wound/dressing, or calf pain - CONTACT YOUR SURGEON.                                                   FOLLOW-UP APPOINTMENTS:  If you do not already have a post-op appointment, please call the office for an appointment to be seen by your surgeon.  Guidelines for how soon to be seen are listed in your "After Visit Summary", but are typically between 1-4 weeks after surgery.  OTHER INSTRUCTIONS:   Knee Replacement:  Do not place pillow under knee, focus on keeping the knee straight while resting. CPM instructions: 0-90 degrees, 2 hours in the morning, 2 hours in the afternoon, and 2 hours in the evening. Place foam block, curve side up under heel at  all times except when in CPM or when walking.  DO NOT modify, tear, cut, or change the foam block in any way.  MAKE SURE YOU:  . Understand these instructions.  . Get help right away if you are not doing well or get worse.    Thank you for letting us be a part of your medical care team.  It is a privilege we respect greatly.  We hope these instructions will help you stay on track for a fast and full recovery!

## 2019-09-01 NOTE — Progress Notes (Signed)
Physical Therapy Treatment Patient Details Name: Sydney [name] MRN: [id number] DOB: [birth year] Today's Date: 09/01/2019    History of Present Illness 59 yo admitted for Left anterior THA. PMHx: DDD, obesity, chronic pain, asthma, HTN    PT Comments    Pt reports still having periods of light headedness since am session with BP 104/55 (70) sitting EOB and drop to 88/50 (62) after gait. Pt with improved gait tolerance with education for stairs and progressive HEP. Pt encouraged to be OOB for all meals, continue HEP and ambulation to bathroom.     Follow Up Recommendations  Follow surgeon's recommendation for DC plan and follow-up therapies     Equipment Recommendations  Rolling walker with 5" wheels    Recommendations for Other Services OT consult     Precautions / Restrictions Precautions Precautions: Fall Restrictions LLE Weight Bearing: Weight bearing as tolerated    Mobility  Bed Mobility Overal bed mobility: Needs Assistance Bed Mobility: Supine to Sit     Supine to sit: Min assist;HOB elevated     General bed mobility comments: min assist of LLE to exit bed to left as at home, increased time, HOB 25 degrees  Transfers Overall transfer level: Needs assistance   Transfers: Sit to/from Stand Sit to Stand: Min guard         General transfer comment: cues for hand placement and sequence  Ambulation/Gait Ambulation/Gait assistance: Min guard Gait Distance (Feet): 180 Feet Assistive device: Rolling walker (2 wheeled) Gait Pattern/deviations: Step-to pattern;Decreased stride length;Decreased stance time - left   Gait velocity interpretation: 1.31 - 2.62 ft/sec, indicative of limited community ambulator General Gait Details: cues for posture and proximity to [employer] Stairs: Yes Stairs assistance: Min assist Stair Management: Step to pattern;Forwards;One rail Right;Backwards;With walker Number of Stairs: 2 General stair comments: pt initially attempted  stairs with Rt rail with step to pattern and able to perform but significant struggle. Then transitioned to perform backward with RW with min assist with increased tolerance and ease, handout provided   Wheelchair Mobility    Modified Rankin (Stroke Patients Only)       Balance Overall balance assessment: Mild deficits observed, not formally tested                                          Cognition Arousal/Alertness: Awake/alert Behavior During Therapy: WFL for tasks assessed/performed Overall Cognitive Status: Within Functional Limits for tasks assessed                                        Exercises Total Joint Exercises Heel Slides: AAROM;Left;Supine;5 reps Hip ABduction/ADduction: AAROM;Left;Seated;10 reps Long Arc Quad: Left;Seated;10 reps;AROM Marching in Standing: AAROM;Left;Seated;10 reps    General Comments        Pertinent Vitals/Pain Pain Score: 7  Pain Location: left hip Pain Descriptors / Indicators: Aching;Guarding Pain Intervention(s): Limited activity within patient's tolerance;Monitored during session;RN gave pain meds during session;Repositioned(pt denied ice)    Home Living                      Prior Function            PT Goals (current goals can now be found in the care plan section) Acute Rehab PT Goals Patient Stated Goal: return  home to make lights PT Goal Formulation: With patient Time For Goal Achievement: 09/08/19 Potential to Achieve Goals: Good Progress towards PT goals: Progressing toward goals    Frequency    7X/week      PT Plan Current plan remains appropriate    Co-evaluation              AM-PAC PT "6 Clicks" Mobility   Outcome Measure  Help needed turning from your back to your side while in a flat bed without using bedrails?: A Little Help needed moving from lying on your back to sitting on the side of a flat bed without using bedrails?: A Little Help needed  moving to and from a bed to a chair (including a wheelchair)?: A Little Help needed standing up from a chair using your arms (e.g., wheelchair or bedside chair)?: A Little Help needed to walk in hospital room?: A Little Help needed climbing 3-5 steps with a railing? : A Little 6 Click Score: 18    End of Session Equipment Utilized During Treatment: Gait belt Activity Tolerance: Patient tolerated treatment well Patient left: in chair;with call bell/phone within reach;with chair alarm set Nurse Communication: Mobility status PT Visit Diagnosis: Other abnormalities of gait and mobility (R26.89);Pain;Difficulty in walking, not elsewhere classified (R26.2) Pain - Right/Left: Left Pain - part of body: Hip     Time: 5035-4656 PT Time Calculation (min) (ACUTE ONLY): 30 min  Charges:  $Gait Training: 8-22 mins $Therapeutic Exercise: 8-22 mins                     Jaylinn Hellenbrand P, PT Acute Rehabilitation Services Pager: [phone] Office: [phone]    Corrin Hingle B Susy Placzek 09/01/2019, 1:38 PM

## 2019-09-01 NOTE — TOC Initial Note (Addendum)
Transition of Care [facility]) - Initial/Assessment Note    Patient Details  Name: Sydney [name] MRN: [id number] Date of Birth: [birth year]  Transition of Care [facility]) CM/SW Contact:    Bartholomew Crews, RN Phone Number: [phone] 09/01/2019, 4:11 PM  Clinical Narrative:                 Advised by bedside RN of potential HH and DME needs. Spoke with patient at the bedside. PTA home with spouse and adult son who are able to assist patient as needed.   Discussed DME needs for RW - patient agreed and referral given to [employer]. Patient will need DME order for RW. Patient reports that she has a 3-N-1 at home already.   Discussed HH needs. Patient in agreement with [facility]. States that her husband has used [facility] when he had his knee surgeries stating that Heron Sabins was great. [facility] sent to [facility] pending acceptance. Patient will need HH order for PT with [facility].   Patient anticipates transport home in private vehicle.   TOC team following for transition needs.   Expected Discharge Plan: [place] Barriers to Discharge: Continued Medical Work up   Patient Goals and CMS Choice Patient states their goals for this hospitalization and ongoing recovery are:: return home with family support CMS Medicare.gov Compare Post Acute Care list provided to:: Patient Choice offered to / list presented to : Patient  Expected Discharge Plan and Services Expected Discharge Plan: [place] In-house Referral: Clinical Social Work Discharge Planning Services: CM Consult Post Acute Care Choice: Durable Medical Equipment, Home Health Living arrangements for the past 2 months: Single Family Home                 DME Arranged: Walker rolling DME Agency: [employer] Date DME Agency Contacted: 09/01/19 Time DME Agency Contacted: [id number] Representative spoke with at DME Agency: [place]: PT [place]: [facility] [employer] Date  [place]: 09/01/19 Time [place]: 48 Representative spoke with at [place]: [place] Arrangements/Services Living arrangements for the past 2 months: [place] with:: Self, Adult Children, Spouse Patient language and need for interpreter reviewed:: Yes Do you feel safe going back to the place where you live?: Yes      Need for Family Participation in Patient Care: Yes (Comment) Care giver support system in place?: Yes (comment) Current home services: DME Criminal Activity/Legal Involvement Pertinent to Current Situation/Hospitalization: No - Comment as needed  Activities of Daily Living Home Assistive Devices/Equipment: Eyeglasses, Cane (specify quad or straight), Walker (specify type) ADL Screening (condition at time of admission) Patient's cognitive ability adequate to safely complete daily activities?: Yes Is the patient deaf or have difficulty hearing?: No Does the patient have difficulty seeing, even when wearing glasses/contacts?: No Does the patient have difficulty concentrating, remembering, or making decisions?: No Patient able to express need for assistance with ADLs?: Yes Does the patient have difficulty dressing or bathing?: No Independently performs ADLs?: Yes (appropriate for developmental age) Does the patient have difficulty walking or climbing stairs?: Yes Weakness of Legs: Both Weakness of Arms/Hands: None  Permission Sought/Granted                  Emotional Assessment Appearance:: Appears stated age Attitude/Demeanor/Rapport: Engaged Affect (typically observed): Accepting Orientation: : Oriented to Self, Oriented to Place, Oriented to  Time, Oriented to Situation Alcohol /  Substance Use: Not Applicable Psych Involvement: No (comment)  Admission diagnosis:  Arthritis of left hip [M16.12] Patient Active Problem List   Diagnosis Date Noted  . Arthritis of left hip 08/31/2019  . Unilateral primary  osteoarthritis, left hip 07/28/2019  . Back pain 07/01/2018  . DDD (degenerative disc disease), lumbosacral 07/01/2018  . Venous insufficiency of both lower extremities 11/17/2016  . 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 Morbid obesity with BMI of 40.0-44.9, adult (HCC) 07/28/2016  . Chronic pain syndrome 10/24/2015  . Dysfunction of both eustachian tubes 07/26/2014  . Calculus of left kidney 04/25/2014  . Impaired fasting glucose 04/05/2014  . History of bladder surgery 04/03/2014  . History of hysterectomy 04/03/2014  . Urge incontinence of urine 04/03/2014  . Asthma 07/28/2013  . Nontoxic uninodular goiter 07/28/2013  . Solitary pulmonary nodule 07/28/2013  . Lumbar radiculopathy 04/20/2013  . Insomnia 03/22/2013  . Essential hypertension 08/18/2011  . Tobacco use disorder 08/18/2011  . Moderate episode of recurrent major depressive disorder (HCC) 04/03/2011  . Other B-complex deficiencies 08/16/2010  . Family history of cerebrovascular accident (CVA) 04/17/2010  . Family history of malignant neoplasm of gastrointestinal tract 04/17/2010  . Family history of other cardiovascular diseases(V17.49) 04/17/2010   PCP:  Raliegh Ip, DO Pharmacy:   [phone] [place], [place] - [address] AT [facility] [address] [place] [address] Phone: [phone] Fax: [phone]  [facility] [place], [place] - [address] AT Portal to Registered [employer] Sites [address] [place] [address] Phone: [phone] Fax: [phone]     Social Determinants of Health (SDOH) Interventions    Readmission Risk Interventions No flowsheet data found.

## 2019-09-01 NOTE — Plan of Care (Signed)
  Problem: Activity: Goal: Risk for activity intolerance will decrease Outcome: Progressing   Problem: Pain Managment: Goal: General experience of comfort will improve Outcome: Progressing   Problem: Safety: Goal: Ability to remain free from injury will improve Outcome: Progressing   

## 2019-09-01 NOTE — Anesthesia Postprocedure Evaluation (Signed)
Anesthesia Post Note  Patient: Sydney [name]  Procedure(s) Performed: LEFT TOTAL HIP ARTHROPLASTY -DIRECT ANTERIOR (Left Hip)     Patient location during evaluation: PACU Anesthesia Type: MAC and Spinal Level of consciousness: awake and alert Pain management: pain level controlled Vital Signs Assessment: post-procedure vital signs reviewed and stable Respiratory status: spontaneous breathing, nonlabored ventilation, respiratory function stable and patient connected to nasal cannula oxygen Cardiovascular status: stable and blood pressure returned to baseline Postop Assessment: no apparent nausea or vomiting and spinal receding Anesthetic complications: no    Last Vitals:  Vitals:   09/01/19 0506 09/01/19 0806  BP: (!) 112/56 (!) 83/54  Pulse: 63 (!) 57  Resp: 18 19  Temp: 37.2 C   SpO2: 97% 93%    Last Pain:  Vitals:   09/01/19 0506  TempSrc: Oral  PainSc:                  Sydney [name]

## 2019-09-01 NOTE — Progress Notes (Signed)
Subjective: Doing ok.  Left hip pain controlled.  Has had low blood pressures. denies chest pain, sob.    Objective: Vital signs in last 24 hours: Temp:  [97 F (36.1 C)-99.3 F (37.4 C)] 98.9 F (37.2 C) (02/11 0851) Pulse Rate:  [50-68] 59 (02/11 1143) Resp:  [11-21] 17 (02/11 0851) BP: (80-133)/(43-70) 104/55 (02/11 1143) SpO2:  [89 %-100 %] 93 % (02/11 0851)  Intake/Output from previous day: 02/10 0701 - 02/11 0700 In: 1750 [I.V.:1500; IV Piggyback:250] Out: 2150 [Urine:1900; Blood:250] Intake/Output this shift: No intake/output data recorded.  Recent Labs    09/01/19 0221  HGB 12.8   Recent Labs    09/01/19 0221  WBC 9.0  RBC 4.26  HCT 39.3  PLT 205   Recent Labs    09/01/19 0221  NA 136  K 4.7  CL 104  CO2 23  BUN 8  CREATININE 0.57  GLUCOSE 139*  CALCIUM 8.6*   No results for input(s): LABPT, INR in the last 72 hours.  Exam: Very pleasant female, alert and oriented. NAD.  Hip wound looks good.  No drainage or signs of infection.      Assessment/Plan: D/c dilaudid.   Orthostatic hypotension-will give 500cc bolus NS now.  RN held bp meds.   Will plan for possible d/c home tomorrow.     Zonia Kief 09/01/2019, 12:30 PM

## 2019-09-01 NOTE — Evaluation (Signed)
Physical Therapy Evaluation Patient Details Name: Sydney [name] MRN: [id number] DOB: 1960-09-24 Today's Date: 09/01/2019   History of Present Illness  59 yo admitted for Left anterior THA. PMHx: DDD, obesity, chronic pain, asthma, HTN  Clinical Impression  Pt very pleasant and reports rough night not sleeping. Pt with pain in left quads limiting mobility and decreased BP with return to sitting with BP after sitting a few minutes post gait 90/50 (66) with HR 63. Pt with decreased strength, rOM, transfers and gait post THA who will benefit from acute therapy to maximize mobility, function and safety.      Follow Up Recommendations Follow surgeon's recommendation for DC plan and follow-up therapies    Equipment Recommendations  Rolling walker with 5" wheels    Recommendations for Other Services OT consult     Precautions / Restrictions Precautions Precautions: Fall Restrictions Weight Bearing Restrictions: Yes LLE Weight Bearing: Weight bearing as tolerated      Mobility  Bed Mobility Overal bed mobility: Needs Assistance Bed Mobility: Supine to Sit     Supine to sit: Min assist;HOB elevated     General bed mobility comments: min assist of LLE to exit bed to left as at home, increased time, HOB 25 degrees  Transfers Overall transfer level: Needs assistance   Transfers: Sit to/from Stand Sit to Stand: Min guard         General transfer comment: cues for hand placement and sequence  Ambulation/Gait Ambulation/Gait assistance: Min guard Gait Distance (Feet): 70 Feet Assistive device: Rolling walker (2 wheeled) Gait Pattern/deviations: Step-to pattern;Decreased stride length;Decreased stance time - left   Gait velocity interpretation: 1.31 - 2.62 ft/sec, indicative of limited community ambulator General Gait Details: cues for posture, rolling RW and sequence. pt with limited stance on LLE due to pain  Stairs            Wheelchair Mobility    Modified  Rankin (Stroke Patients Only)       Balance Overall balance assessment: Mild deficits observed, not formally tested                                           Pertinent Vitals/Pain Pain Assessment: 0-10 Pain Score: 6  Pain Location: left hip Pain Descriptors / Indicators: Aching;Guarding Pain Intervention(s): Limited activity within patient's tolerance;Monitored during session;Premedicated before session;Repositioned    Home Living Family/patient expects to be discharged to:: Private residence Living Arrangements: Children;Spouse/significant other Available Help at Discharge: Family;Available 24 hours/day Type of Home: House Home Access: Stairs to enter   [employer] of Steps: 3 Home Layout: One level Home Equipment: Walker - 2 wheels;Cane - single point;Bedside commode      Prior Function Level of Independence: Independent               Hand Dominance        Extremity/Trunk Assessment   Upper Extremity Assessment Upper Extremity Assessment: Overall WFL for tasks assessed    Lower Extremity Assessment Lower Extremity Assessment: LLE deficits/detail LLE Deficits / Details: decreased ROM and strength post op    Cervical / Trunk Assessment Cervical / Trunk Assessment: Normal  Communication   Communication: No difficulties  Cognition Arousal/Alertness: Awake/alert Behavior During Therapy: WFL for tasks assessed/performed Overall Cognitive Status: Within Functional Limits for tasks assessed  General Comments      Exercises Total Joint Exercises Heel Slides: AAROM;Left;Supine;5 reps Hip ABduction/ADduction: AAROM;Left;Supine;5 reps Long Arc Quad: AAROM;Left;Seated;10 reps   Assessment/Plan    PT Assessment Patient needs continued PT services  PT Problem List Decreased range of motion;Decreased strength;Decreased mobility;Decreased activity tolerance;Decreased knowledge  of use of DME;Pain       PT Treatment Interventions DME instruction;Therapeutic exercise;Gait training;Balance training;Stair training;Functional mobility training;Therapeutic activities;Patient/family education    PT Goals (Current goals can be found in the Care Plan section)  Acute Rehab PT Goals Patient Stated Goal: return home to make lights PT Goal Formulation: With patient Time For Goal Achievement: 09/08/19 Potential to Achieve Goals: Good    Frequency 7X/week   Barriers to discharge        Co-evaluation               AM-PAC PT "6 Clicks" Mobility  Outcome Measure Help needed turning from your back to your side while in a flat bed without using bedrails?: A Little Help needed moving from lying on your back to sitting on the side of a flat bed without using bedrails?: A Little Help needed moving to and from a bed to a chair (including a wheelchair)?: A Little Help needed standing up from a chair using your arms (e.g., wheelchair or bedside chair)?: A Little Help needed to walk in hospital room?: A Little Help needed climbing 3-5 steps with a railing? : A Little 6 Click Score: 18    End of Session Equipment Utilized During Treatment: Gait belt Activity Tolerance: Patient tolerated treatment well Patient left: in chair;with call bell/phone within reach;with chair alarm set Nurse Communication: Mobility status PT Visit Diagnosis: Other abnormalities of gait and mobility (R26.89);Pain;Difficulty in walking, not elsewhere classified (R26.2) Pain - Right/Left: Left Pain - part of body: Hip    Time: 0724-0759 PT Time Calculation (min) (ACUTE ONLY): 35 min   Charges:   PT Evaluation $PT Eval Moderate Complexity: 1 Mod PT Treatments $Gait Training: 8-22 mins        Mayleigh Tetrault P, PT Acute Rehabilitation Services Pager: [phone] Office: [phone]   Sandy Salaam Xsavier Seeley 09/01/2019, 9:44 AM

## 2019-09-02 DIAGNOSIS — M1612 Unilateral primary osteoarthritis, left hip: Secondary | ICD-10-CM | POA: Diagnosis not present

## 2019-09-02 LAB — CBC
HCT: 35.1 % — ABNORMAL LOW (ref 36.0–46.0)
Hemoglobin: 11.2 g/dL — ABNORMAL LOW (ref 12.0–15.0)
MCH: 30 pg (ref 26.0–34.0)
MCHC: 31.9 g/dL (ref 30.0–36.0)
MCV: 94.1 fL (ref 80.0–100.0)
Platelets: 189 10*3/uL (ref 150–400)
RBC: 3.73 MIL/uL — ABNORMAL LOW (ref 3.87–5.11)
RDW: 13.7 % (ref 11.5–15.5)
WBC: 6.8 10*3/uL (ref 4.0–10.5)
nRBC: 0 % (ref 0.0–0.2)

## 2019-09-02 NOTE — Progress Notes (Signed)
   Subjective: 2 Days Post-Op Procedure(s) (LRB): LEFT TOTAL HIP ARTHROPLASTY -DIRECT ANTERIOR (Left) Patient reports pain as moderate.    Objective: Vital signs in last 24 hours: Temp:  [99 F (37.2 C)-101.1 F (38.4 C)] 99 F (37.2 C) (02/12 0900) Pulse Rate:  [59-79] 76 (02/12 0818) Resp:  [16-17] 17 (02/12 0818) BP: (104-138)/(55-70) 120/61 (02/12 0818) SpO2:  [94 %-96 %] 95 % (02/12 0900)  Intake/Output from previous day: 02/11 0701 - 02/12 0700 In: 2425.9 [I.V.:2425.9] Out: -  Intake/Output this shift: No intake/output data recorded.  Recent Labs    09/01/19 0221 09/02/19 0501  HGB 12.8 11.2*   Recent Labs    09/01/19 0221 09/02/19 0501  WBC 9.0 6.8  RBC 4.26 3.73*  HCT 39.3 35.1*  PLT 205 189   Recent Labs    09/01/19 0221  NA 136  K 4.7  CL 104  CO2 23  BUN 8  CREATININE 0.57  GLUCOSE 139*  CALCIUM 8.6*   No results for input(s): LABPT, INR in the last 72 hours.  Neurologically intact No results found.  Assessment/Plan: 2 Days Post-Op Procedure(s) (LRB): LEFT TOTAL HIP ARTHROPLASTY -DIRECT ANTERIOR (Left) Up with therapy one last time today then discharge home. Walker DME ordered. No HH PT ordered. ROV one week  Eldred Manges 09/02/2019, 9:18 AM

## 2019-09-02 NOTE — Care Management (Signed)
Called [facility] for walker. Per Dr Ophelia Charter note no HHPT.  Ronny Flurry RN

## 2019-09-02 NOTE — Progress Notes (Signed)
Physical Therapy Treatment Patient Details Name: Sydney [name] MRN: [id number] DOB: February 04, 1961 Today's Date: 09/02/2019    History of Present Illness 59 yo admitted for Left anterior THA. PMHx: DDD, obesity, chronic pain, asthma, HTN    PT Comments    Pt with continued struggle with left hip flexion and foot clearance with gait with education for correction. Pt provided HEP and educated for home and for walking program.   BP 106/58, HR 72   Follow Up Recommendations  Follow surgeon's recommendation for DC plan and follow-up therapies     Equipment Recommendations  Rolling walker with 5" wheels    Recommendations for Other Services       Precautions / Restrictions Precautions Precautions: Fall Restrictions Weight Bearing Restrictions: Yes LLE Weight Bearing: Weight bearing as tolerated    Mobility  Bed Mobility Overal bed mobility: Modified Independent Bed Mobility: Supine to Sit     Supine to sit: HOB elevated     General bed mobility comments: HOB 25 degrees with increased time and pt able to move LLE off of bed with use of belt  Transfers Overall transfer level: Needs assistance   Transfers: Sit to/from Stand Sit to Stand: Min guard         General transfer comment: cues for hand placement and sequence increased time to rise from bed and BSC  Ambulation/Gait Ambulation/Gait assistance: Min guard Gait Distance (Feet): 150 Feet Assistive device: Rolling walker (2 wheeled) Gait Pattern/deviations: Step-to pattern;Decreased stride length;Decreased stance time - left   Gait velocity interpretation: >2.62 ft/sec, indicative of community ambulatory General Gait Details: decreased left hip and knee flexion with cues for sequence and to not drag leg or circumduct   Stairs             Wheelchair Mobility    Modified Rankin (Stroke Patients Only)       Balance Overall balance assessment: Mild deficits observed, not formally tested                                           Cognition Arousal/Alertness: Awake/alert Behavior During Therapy: WFL for tasks assessed/performed Overall Cognitive Status: Within Functional Limits for tasks assessed                                        Exercises Total Joint Exercises Hip ABduction/ADduction: Left;Standing;10 reps Marching in Standing: AROM;Left;Seated;Standing;5 reps(5 in standing, 5 in sitting)    General Comments General comments (skin integrity, edema, etc.): pt using gait belt as a leg lift on arrival       Pertinent Vitals/Pain Pain Score: 7  Pain Location: left hip with activity, 3/10 at rest Pain Descriptors / Indicators: Aching;Guarding;Cramping Pain Intervention(s): Limited activity within patient's tolerance;Monitored during session;Premedicated before session;Repositioned    Home Living Family/patient expects to be discharged to:: Private residence Living Arrangements: Children;Spouse/significant other Available Help at Discharge: Family;Available 24 hours/day Type of Home: House Home Access: Stairs to enter   Home Layout: One level Home Equipment: Environmental consultant - 2 wheels;Cane - single point;Bedside commode Additional Comments:       Prior Function Level of Independence: Independent          PT Goals (current goals can now be found in the care plan section) Acute Rehab PT Goals Patient Stated Goal: to  go home  Progress towards PT goals: Progressing toward goals    Frequency           PT Plan Current plan remains appropriate    Co-evaluation              AM-PAC PT "6 Clicks" Mobility   Outcome Measure  Help needed turning from your back to your side while in a flat bed without using bedrails?: None Help needed moving from lying on your back to sitting on the side of a flat bed without using bedrails?: A Little Help needed moving to and from a bed to a chair (including a wheelchair)?: A Little Help needed  standing up from a chair using your arms (e.g., wheelchair or bedside chair)?: A Little Help needed to walk in hospital room?: A Little Help needed climbing 3-5 steps with a railing? : A Little 6 Click Score: 19    End of Session   Activity Tolerance: Patient tolerated treatment well Patient left: in chair;with call bell/phone within reach Nurse Communication: Mobility status PT Visit Diagnosis: Other abnormalities of gait and mobility (R26.89);Pain;Difficulty in walking, not elsewhere classified (R26.2)     Time: 8115-7262 PT Time Calculation (min) (ACUTE ONLY): 33 min  Charges:                        Sydney [name], PT Acute Rehabilitation Services Pager: [phone] Office: [phone]    Sydney [name] B Sydney [name] 09/02/2019, 1:29 PM

## 2019-09-02 NOTE — Progress Notes (Signed)
Pt is ambulating with walker, left hip dressing dry and intact. Pain is controlled with oral narcotics. Discharge instructions given to pt, verbalized understanding. Discharged to home accompanied by husband.

## 2019-09-02 NOTE — Progress Notes (Signed)
OT EVALUATION Patient is s/p L THA direct anterior surgery resulting in functional limitations due to the deficits listed below (see OT problem list). Pt currently demonstrates fall risk due to L LE dragging with basic transfers. Pt will have son and spouse (A) upon d/c. Pt is dressed and tolerated full ADL session. Pt plans to purchase reacher from [place].  Patient will benefit from skilled OT acutely to increase independence and safety with ADLS to allow discharge home.    09/02/19 1100  OT Visit Information  Last OT Received On 09/02/19  Assistance Needed +1  History of Present Illness 59 yo admitted for Left anterior THA. PMHx: DDD, obesity, chronic pain, asthma, HTN  Precautions  Precautions Fall  Restrictions  Weight Bearing Restrictions Yes  LLE Weight Bearing WBAT  Home Living  Family/patient expects to be discharged to: Private residence  Living Arrangements Children;Spouse/significant other  Available Help at Discharge Family;Available 24 hours/day  Type of Home House  Home Access Stairs to enter  Entrance Stairs-Number of Steps 3  Home Layout One level  Bathroom Shower/Tub Walk-in shower;Tub/shower unit  Tax adviser - 2 wheels;Cane - single point;BSC  Additional Comments     Prior Function  Level of Independence Independent  Communication  Communication No difficulties  Pain Assessment  Pain Location left hip with activity, 3/10 at rest  Pain Descriptors / Indicators Aching;Guarding;Cramping  Cognition  Arousal/Alertness Awake/alert  Behavior During Therapy WFL for tasks assessed/performed  Overall Cognitive Status Within Functional Limits for tasks assessed  Lower Extremity Assessment  LLE Deficits / Details decreased ROM and strength post op  Cervical / Trunk Assessment  Cervical / Trunk Assessment Normal  Bed Mobility  Overal bed mobility Needs Assistance  Bed Mobility Supine to Sit  Supine to sit Min assist;HOB elevated   General bed mobility comments min assist of LLE to exit bed to left as at home, increased time, HOB 25 degrees  Transfers  Overall transfer level Needs assistance  Transfers Sit to/from Stand  Sit to Stand Min guard  General transfer comment cues for hand placement and sequence increased time to rise from bed and BSC  Balance  Overall balance assessment Mild deficits observed, not formally tested  General Comments  General comments (skin integrity, edema, etc.) pt using gait belt as a leg lift on arrival   OT - End of Session  Equipment Utilized During Treatment Rolling walker  Activity Tolerance Patient tolerated treatment well  Patient left in bed;with call bell/phone within reach;with bed alarm set  Nurse Communication Mobility status;Precautions  OT Assessment  OT Recommendation/Assessment Patient needs continued OT Services  OT Visit Diagnosis Unsteadiness on feet (R26.81);Muscle weakness (generalized) (M62.81)  OT Problem List Decreased strength;Decreased activity tolerance;Impaired balance (sitting and/or standing);Decreased safety awareness;Decreased knowledge of use of DME or AE;Decreased knowledge of precautions;Obesity;Pain  OT Plan  OT Frequency (ACUTE ONLY) Min 2X/week  OT Treatment/Interventions (ACUTE ONLY) Self-care/ADL training;Therapeutic exercise;Neuromuscular education;Energy conservation;DME and/or AE instruction;Manual therapy;Modalities;Therapeutic activities;Patient/family education;Balance training  AM-PAC OT "6 Clicks" Daily Activity Outcome Measure (Version 2)  Help from another person eating meals? 4  Help from another person taking care of personal grooming? 4  Help from another person toileting, which includes using toliet, bedpan, or urinal? 3  Help from another person bathing (including washing, rinsing, drying)? 3  Help from another person to put on and taking off regular upper body clothing? 4  Help from another person to put on and taking off regular  lower  body clothing? 2  6 Click Score 20  OT Recommendation  Follow Up Recommendations No OT follow up  OT Equipment Other (comment) (reacher plans to buy on [place])  Individuals Consulted  Consulted and Agree with Results and Recommendations Patient  Acute Rehab OT Goals  Patient Stated Goal to go home   OT Goal Formulation With patient  Time For Goal Achievement 09/16/19  Potential to Achieve Goals Good  OT Time Calculation  OT Start Time (ACUTE ONLY) 1054  OT Stop Time (ACUTE ONLY) 1126  OT Time Calculation (min) 32 min  OT General Charges  $OT Visit 1 Visit  OT Evaluation  $OT Eval Moderate Complexity 1 Mod  OT Treatments  $Self Care/Home Management  8-22 mins   Brynn, OTR/L  Acute Rehabilitation Services Pager: [phone] Office: [phone] .

## 2019-09-02 NOTE — Progress Notes (Signed)
Physical Therapy Treatment Patient Details Name: Sydney [name] MRN: [id number] DOB: 02/05/1961 Today's Date: 09/02/2019    History of Present Illness 59 yo admitted for Left anterior THA. PMHx: DDD, obesity, chronic pain, asthma, HTN    PT Comments    Pt pleasant and reports having slept last night despite several bathroom trips. Pt with decreased left hip flexion with activity today and complaints of left distal calf pain to pressure. Pt with initial low BP of 115/61 (75 ) with short 15' of ambulation and after 65' of ambulation rise to 152/129. Pt educated for HEP, transfers and progression limited by pain and fatigue. Will continue to follow.     Follow Up Recommendations  Follow surgeon's recommendation for DC plan and follow-up therapies     Equipment Recommendations  Rolling walker with 5" wheels    Recommendations for Other Services       Precautions / Restrictions Precautions Precautions: Fall Restrictions LLE Weight Bearing: Weight bearing as tolerated    Mobility  Bed Mobility Overal bed mobility: Needs Assistance Bed Mobility: Supine to Sit     Supine to sit: Min assist;HOB elevated     General bed mobility comments: min assist of LLE to exit bed to left as at home, increased time, HOB 25 degrees  Transfers Overall transfer level: Needs assistance   Transfers: Sit to/from Stand Sit to Stand: Min guard         General transfer comment: cues for hand placement and sequence increased time to rise from bed and BSC  Ambulation/Gait Ambulation/Gait assistance: Min guard Gait Distance (Feet): 75 Feet Assistive device: Rolling walker (2 wheeled) Gait Pattern/deviations: Step-to pattern;Decreased stride length;Decreased stance time - left   Gait velocity interpretation: 1.31 - 2.62 ft/sec, indicative of limited community ambulator General Gait Details: decreased left hip flexion compared to yesterday, cues for posture, sequence and RW use. Limited by  fatigue and lightheadedness with BP actually rising after gait to 152/129   Stairs             Wheelchair Mobility    Modified Rankin (Stroke Patients Only)       Balance Overall balance assessment: Mild deficits observed, not formally tested                                          Cognition Arousal/Alertness: Awake/alert Behavior During Therapy: WFL for tasks assessed/performed Overall Cognitive Status: Within Functional Limits for tasks assessed                                        Exercises Total Joint Exercises Heel Slides: AAROM;Left;Supine;10 reps Hip ABduction/ADduction: AAROM;Left;10 reps;Supine Long Arc Quad: Left;Seated;10 reps;AROM Marching in Standing: AAROM;Left;Seated;10 reps    General Comments        Pertinent Vitals/Pain Pain Score: 6  Pain Location: left hip with activity, 3/10 at rest Pain Descriptors / Indicators: Aching;Guarding;Cramping Pain Intervention(s): Limited activity within patient's tolerance;Monitored during session;Premedicated before session;Repositioned    Home Living                      Prior Function            PT Goals (current goals can now be found in the care plan section) Progress towards PT goals: Progressing toward goals  Frequency    7X/week      PT Plan Current plan remains appropriate    Co-evaluation              AM-PAC PT "6 Clicks" Mobility   Outcome Measure  Help needed turning from your back to your side while in a flat bed without using bedrails?: A Little Help needed moving from lying on your back to sitting on the side of a flat bed without using bedrails?: A Little Help needed moving to and from a bed to a chair (including a wheelchair)?: A Little Help needed standing up from a chair using your arms (e.g., wheelchair or bedside chair)?: A Little Help needed to walk in hospital room?: A Little Help needed climbing 3-5 steps with a  railing? : A Little 6 Click Score: 18    End of Session Equipment Utilized During Treatment: Gait belt Activity Tolerance: Patient tolerated treatment well Patient left: in chair;with call bell/phone within reach Nurse Communication: Mobility status PT Visit Diagnosis: Other abnormalities of gait and mobility (R26.89);Pain;Difficulty in walking, not elsewhere classified (R26.2)     Time: 4128-7867 PT Time Calculation (min) (ACUTE ONLY): 33 min  Charges:  $Gait Training: 8-22 mins $Therapeutic Exercise: 8-22 mins                     Sydney [name], PT Acute Rehabilitation Services Pager: [phone] Office: [phone]    Sydney [name] Sydney [name] 09/02/2019, 8:54 AM

## 2019-09-05 ENCOUNTER — Telehealth: Payer: Self-pay | Admitting: Orthopaedic Surgery

## 2019-09-05 NOTE — Telephone Encounter (Signed)
Sydney [name] with [facility] called in requesting verbal orders for home health pt for 2 times a week for 1 week and 1 time a week for 1 week.   [phone]

## 2019-09-05 NOTE — Telephone Encounter (Signed)
Does not need HHPT thanks. [facility].

## 2019-09-05 NOTE — Telephone Encounter (Signed)
Ok for orders? 

## 2019-09-05 NOTE — Telephone Encounter (Signed)
I called Sydney [name] and advised.

## 2019-09-06 ENCOUNTER — Encounter: Payer: Self-pay | Admitting: *Deleted

## 2019-09-06 NOTE — Discharge Summary (Signed)
Patient ID: Sydney [name] MRN: [id number] DOB/AGE: August 29, 1960 59 y.o.  Admit date: 08/31/2019 Discharge date: 09/02/2019 Admission Diagnoses:  Active Problems:   Arthritis of left hip   Discharge Diagnoses:  Active Problems:   Arthritis of left hip  status post Procedure(s): LEFT TOTAL HIP ARTHROPLASTY -DIRECT ANTERIOR  Past Medical History:  Diagnosis Date  . Allergy   . Anemia    history of  . Anxiety   . Arthritis   . Asthma   . Atherosclerosis   . Depression   . Family history of adverse reaction to anesthesia    pt's mother felt everything and couldn't speak during a gallbladder surgery  . GERD (gastroesophageal reflux disease)   . History of hiatal hernia   . History of kidney stones   . Hypertension   . Pneumonia    a few times  . TIA (transient ischemic attack) 2012    Surgeries: Procedure(s): LEFT TOTAL HIP ARTHROPLASTY -DIRECT ANTERIOR on 08/31/2019   Consultants:   Discharged Condition: Improved  Hospital Course: Azaylea Maves is an 59 y.o. female who was admitted 08/31/2019 for operative treatment of left hip arthritis. Patient failed conservative treatments (please see the history and physical for the specifics) and had severe unremitting pain that affects sleep, daily activities and work/hobbies. After pre-op clearance, the patient was taken to the operating room on 08/31/2019 and underwent  Procedure(s): LEFT TOTAL HIP ARTHROPLASTY -DIRECT ANTERIOR.    Patient was given perioperative antibiotics:  Anti-infectives (From admission, onward)   Start     Dose/Rate Route Frequency Ordered Stop   08/31/19 1115  vancomycin (VANCOREADY) IVPB 1500 mg/300 mL     1,500 mg 150 mL/hr over 120 Minutes Intravenous To [facility]Stay Surgical 08/30/19 1143 08/31/19 1333       Patient was given sequential compression devices and early ambulation to prevent DVT.   Patient benefited maximally from hospital stay and there were no complications. At the time of discharge,  the patient was urinating/moving their bowels without difficulty, tolerating a regular diet, pain is controlled with oral pain medications and they have been cleared by PT/OT.   Recent vital signs: No data found.   Recent laboratory studies: No results for input(s): WBC, HGB, HCT, PLT, NA, K, CL, CO2, BUN, CREATININE, GLUCOSE, INR, CALCIUM in the last 72 hours.  Invalid input(s): PT, 2   Discharge Medications:   Allergies as of 09/02/2019      Reactions   Bee Pollen Anaphylaxis, Swelling   Bee Venom Anaphylaxis, Swelling   Butorphanol Other (See Comments)   Not sure told by md she was allergic after surgery.   Meloxicam Anxiety   Mood disorder Altered her personality   Penicillins Anaphylaxis, Hives   Unable to Recall As child mom was told she is highly allergic Did it involve swelling of the face/tongue/throat, SOB, or low BP? Unknown Did it involve sudden or severe rash/hives, skin peeling, or any reaction on the inside of your mouth or nose? Unknown Did you need to seek medical attention at a hospital or doctor's office? Unknown When did it last happen?childhood allergy If all above answers are "NO", may proceed with cephalosporin use.   Prochlorperazine Edisylate Anaphylaxis   Sulfa Antibiotics Anaphylaxis   Unable to Recall   Tramadol Anxiety   Didn't like the way it made her feel   Topiramate Nausea And Vomiting      Medication List    STOP taking these medications   Norco 7.5-325 MG tablet Generic drug:  HYDROcodone-acetaminophen     TAKE these medications   albuterol 108 (90 Base) MCG/ACT inhaler Commonly known as: ProAir HFA Inhale 2 puffs into the lungs every 6 (six) hours as needed for wheezing or shortness of breath.   amLODipine 5 MG tablet Commonly known as: NORVASC Take 1 tablet (5 mg total) by mouth daily.   aspirin 325 MG EC tablet Take 1 tablet (325 mg total) by mouth daily with breakfast.   calcium carbonate 500 MG chewable  tablet Commonly known as: TUMS - dosed in mg elemental calcium Chew 2 tablets by mouth daily as needed for indigestion or heartburn.   fexofenadine 180 MG tablet Commonly known as: ALLEGRA Take 180 mg by mouth daily as needed for allergies or rhinitis.   Flinstones Gummies Omega-3 DHA Chew Chew 1 tablet by mouth at bedtime.   methocarbamol 500 MG tablet Commonly known as: ROBAXIN Take 1 tablet (500 mg total) by mouth every 6 (six) hours as needed for muscle spasms.   metoprolol succinate 25 MG 24 hr tablet Commonly known as: TOPROL-XL Take 1 tablet (25 mg total) by mouth daily.   ondansetron 4 MG disintegrating tablet Commonly known as: Zofran ODT Take 1 tablet (4 mg total) by mouth every 8 (eight) hours as needed for nausea or vomiting.   oxyCODONE-acetaminophen 5-325 MG tablet Commonly known as: PERCOCET/ROXICET Take 2 tablets by mouth every 6 (six) hours as needed for severe pain.   phenazopyridine 100 MG tablet Commonly known as: Pyridium Take 1 tablet (100 mg total) by mouth 3 (three) times daily as needed for pain.   sertraline 100 MG tablet Commonly known as: ZOLOFT Take 100 mg by mouth daily.   traZODone 100 MG tablet Commonly known as: DESYREL Take 200 mg by mouth at bedtime.       Diagnostic Studies: DG Chest 2 View  Result Date: 08/24/2019 CLINICAL DATA:  Preoperative study for left hip replacement. EXAM: CHEST - 2 VIEW COMPARISON:  None. FINDINGS: The heart size and mediastinal contours are within normal limits. Both lungs are clear. The visualized skeletal structures are unremarkable. IMPRESSION: No active cardiopulmonary disease. Electronically Signed   By: Obie Dredge M.D.   On: 08/24/2019 17:03   DG C-Arm 1-60 Min  Result Date: 08/31/2019 CLINICAL DATA:  Anterior left hip arthroplasty EXAM: OPERATIVE left HIP (WITH PELVIS IF PERFORMED) 2 VIEWS TECHNIQUE: Fluoroscopic spot image(s) were submitted for interpretation post-operatively. COMPARISON:  None.  FINDINGS: Three fluoroscopic images were obtained during the performance of the procedure and are provided for interpretation only. Left hip arthroplasty is in the expected position without evidence of acute complication. FLUOROSCOPY TIME:  19 seconds IMPRESSION: 1. Unremarkable left hip arthroplasty. Electronically Signed   By: Sharlet Salina M.D.   On: 08/31/2019 15:09   DG Hip Port Unilat With Pelvis 1V Left  Result Date: 08/31/2019 CLINICAL DATA:  Left hip arthroplasty EXAM: DG HIP (WITH OR WITHOUT PELVIS) 1V PORT LEFT COMPARISON:  08/31/2019 FINDINGS: Frontal and lateral views of the left hip are obtained. Left hip arthroplasty is in the expected position without signs of acute complication. Postsurgical changes are seen within the soft tissues. There is moderate right hip osteoarthritis with axial joint space narrowing and marginal osteophyte formation. IMPRESSION: 1. Left hip arthroplasty as above. Electronically Signed   By: Sharlet Salina M.D.   On: 08/31/2019 16:04   DG HIP OPERATIVE UNILAT WITH PELVIS LEFT  Result Date: 08/31/2019 CLINICAL DATA:  Anterior left hip arthroplasty EXAM: OPERATIVE left HIP (WITH  PELVIS IF PERFORMED) 2 VIEWS TECHNIQUE: Fluoroscopic spot image(s) were submitted for interpretation post-operatively. COMPARISON:  None. FINDINGS: Three fluoroscopic images were obtained during the performance of the procedure and are provided for interpretation only. Left hip arthroplasty is in the expected position without evidence of acute complication. FLUOROSCOPY TIME:  19 seconds IMPRESSION: 1. Unremarkable left hip arthroplasty. Electronically Signed   By: Sharlet Salina M.D.   On: 08/31/2019 15:09      Follow-up Information    Eldred Manges, MD. Schedule an appointment as soon as possible for a visit in 1 week(s).   Specialty: Orthopedic Surgery Why: CALL TO SCHEDULE RETURN OFFICE VISIT.  Contact information: [address] [place] [address] [phone]            Discharge Plan:  discharge to home  Disposition:     Signed: Zonia Kief  09/06/2019, 10:31 AM

## 2019-09-08 ENCOUNTER — Ambulatory Visit (INDEPENDENT_AMBULATORY_CARE_PROVIDER_SITE_OTHER): Payer: No Typology Code available for payment source | Admitting: Orthopaedic Surgery

## 2019-09-08 ENCOUNTER — Encounter: Payer: Self-pay | Admitting: Orthopaedic Surgery

## 2019-09-08 ENCOUNTER — Inpatient Hospital Stay: Payer: No Typology Code available for payment source | Admitting: Orthopaedic Surgery

## 2019-09-08 ENCOUNTER — Ambulatory Visit: Payer: Self-pay

## 2019-09-08 VITALS — Ht 65.5 in | Wt 235.0 lb

## 2019-09-08 DIAGNOSIS — Z96642 Presence of left artificial hip joint: Secondary | ICD-10-CM

## 2019-09-08 NOTE — Progress Notes (Signed)
Post-Op Visit Note   Patient: Sydney [name]           Date of Birth: 02-02-1961           MRN: [id number] Visit Date: 09/08/2019 PCP: Raliegh Ip, DO   Assessment & Plan:ROV one week  Chief Complaint:  Chief Complaint  Patient presents with  . Left Hip - Routine Post Op    08/31/2019 Left THA-Direct Anterior   Visit Diagnoses:  1. Status post total hip replacement, left     Plan: Incision looks good she has 20 pain pills left.  Leg lengths are equal incision looks good return in 1 week for staple removal.  Follow-Up Instructions: No follow-ups on file.   Orders:  Orders Placed This Encounter  Procedures  . XR HIP UNILAT W OR W/O PELVIS 2-3 VIEWS LEFT   No orders of the defined types were placed in this encounter.   Imaging: No results found.  PMFS History: Patient Active Problem List   Diagnosis Date Noted  . Arthritis of left hip 08/31/2019  . Unilateral primary osteoarthritis, left hip 07/28/2019  . Back pain 07/01/2018  . DDD (degenerative disc disease), lumbosacral 07/01/2018  . Venous insufficiency of both lower extremities 11/17/2016  . 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 Morbid obesity with BMI of 40.0-44.9, adult (HCC) 07/28/2016  . Chronic pain syndrome 10/24/2015  . Dysfunction of both eustachian tubes 07/26/2014  . Calculus of left kidney 04/25/2014  . Impaired fasting glucose 04/05/2014  . History of bladder surgery 04/03/2014  . History of hysterectomy 04/03/2014  . Urge incontinence of urine 04/03/2014  . Asthma 07/28/2013  . Nontoxic uninodular goiter 07/28/2013  . Solitary pulmonary nodule 07/28/2013  . Lumbar radiculopathy 04/20/2013  . Insomnia 03/22/2013  . Essential hypertension 08/18/2011  . Tobacco use disorder 08/18/2011  . Moderate episode of recurrent major depressive disorder (HCC) 04/03/2011  . Other B-complex deficiencies 08/16/2010  . Family history of cerebrovascular accident (CVA)  04/17/2010  . Family history of malignant neoplasm of gastrointestinal tract 04/17/2010  . Family history of other cardiovascular diseases(V17.49) 04/17/2010   Past Medical History:  Diagnosis Date  . Allergy   . Anemia    history of  . Anxiety   . Arthritis   . Asthma   . Atherosclerosis   . Depression   . Family history of adverse reaction to anesthesia    pt's mother felt everything and couldn't speak during a gallbladder surgery  . GERD (gastroesophageal reflux disease)   . History of hiatal hernia   . History of kidney stones   . Hypertension   . Pneumonia    a few times  . TIA (transient ischemic attack) 2012    Family History  Problem Relation Age of Onset  . Anxiety disorder Mother   . Depression Mother   . Heart disease Mother   . Hypertension Mother   . Arrhythmia Mother   . Cancer Father   . Lung cancer Father   . Migraines Sister   . Alcohol abuse Brother   . Cancer Daughter        nerve sheath sarcoma    Past Surgical History:  Procedure Laterality Date  . ABDOMINAL HYSTERECTOMY     abdominal  . APPENDECTOMY    . bladder tack    . CARPAL TUNNEL RELEASE Bilateral   . CERVICAL SPINE SURGERY     5-6, 6-7  . CHOLECYSTECTOMY    . HAMMER TOE  SURGERY Right   . TOTAL HIP 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   Procedure: LEFT TOTAL HIP ARTHROPLASTY -DIRECT ANTERIOR;  Surgeon: Marybelle Killings, MD;  Location: [facility];  Service: Orthopedics;  Laterality: Left;   Social History   Occupational History  . Not on file  Tobacco Use  . Smoking status: Current Every Day Smoker    Packs/day: 0.50    Years: 23.00    Pack years: 11.50    Types: Cigarettes  . Smokeless tobacco: Never Used  Substance and Sexual Activity  . Alcohol use: Yes    Comment: occ  . Drug use: Never  . Sexual activity: Not Currently

## 2019-09-15 ENCOUNTER — Encounter: Payer: Self-pay | Admitting: Orthopaedic Surgery

## 2019-09-15 ENCOUNTER — Ambulatory Visit (INDEPENDENT_AMBULATORY_CARE_PROVIDER_SITE_OTHER): Payer: No Typology Code available for payment source | Admitting: Orthopaedic Surgery

## 2019-09-15 DIAGNOSIS — Z96642 Presence of left artificial hip joint: Secondary | ICD-10-CM | POA: Insufficient documentation

## 2019-09-15 NOTE — Progress Notes (Signed)
Post-Op Visit Note   Patient: Sydney [name]           Date of Birth: 06-08-1961           MRN: [id number] Visit Date: 09/15/2019 PCP: Janora Norlander, DO   Assessment & Plan:2 wks post THA , staples removed.   Chief Complaint:  Chief Complaint  Patient presents with  . Left Hip - Wound Check    08/31/2019 Left THA   Visit Diagnoses:  1. Status post total replacement of left hip     Plan: Return 4 weeks.  She is trying to increase her mobility quickly and wants to talk about scheduling her opposite right hip once her left hip is doing well.  Follow-Up Instructions: Return in about 4 weeks (around 10/13/2019).   Orders:  No orders of the defined types were placed in this encounter.  No orders of the defined types were placed in this encounter.   Imaging: No results found.  PMFS History: Patient Active Problem List   Diagnosis Date Noted  . Status post total replacement of left hip 09/15/2019  . Back pain 07/01/2018  . DDD (degenerative disc disease), lumbosacral 07/01/2018  . Venous insufficiency of both lower extremities 11/17/2016  . 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 Morbid obesity with BMI of 40.0-44.9, adult ([facility]) 07/28/2016  . Chronic pain syndrome 10/24/2015  . Dysfunction of both eustachian tubes 07/26/2014  . Calculus of left kidney 04/25/2014  . Impaired fasting glucose 04/05/2014  . History of bladder surgery 04/03/2014  . History of hysterectomy 04/03/2014  . Urge incontinence of urine 04/03/2014  . Asthma 07/28/2013  . Nontoxic uninodular goiter 07/28/2013  . Solitary pulmonary nodule 07/28/2013  . Lumbar radiculopathy 04/20/2013  . Insomnia 03/22/2013  . Essential hypertension 08/18/2011  . Tobacco use disorder 08/18/2011  . Moderate episode of recurrent major depressive disorder ([place]) 04/03/2011  . Other B-complex deficiencies 08/16/2010  . Family history of cerebrovascular accident (CVA) 04/17/2010  .  Family history of malignant neoplasm of gastrointestinal tract 04/17/2010  . Family history of other cardiovascular diseases(V17.49) 04/17/2010   Past Medical History:  Diagnosis Date  . Allergy   . Anemia    history of  . Anxiety   . Arthritis   . Asthma   . Atherosclerosis   . Depression   . Family history of adverse reaction to anesthesia    pt's mother felt everything and couldn't speak during a gallbladder surgery  . GERD (gastroesophageal reflux disease)   . History of hiatal hernia   . History of kidney stones   . Hypertension   . Pneumonia    a few times  . TIA (transient ischemic attack) 2012    Family History  Problem Relation Age of Onset  . Anxiety disorder Mother   . Depression Mother   . Heart disease Mother   . Hypertension Mother   . Arrhythmia Mother   . Cancer Father   . Lung cancer Father   . Migraines Sister   . Alcohol abuse Brother   . Cancer Daughter        nerve sheath sarcoma    Past Surgical History:  Procedure Laterality Date  . ABDOMINAL HYSTERECTOMY     abdominal  . APPENDECTOMY    . bladder tack    . CARPAL TUNNEL RELEASE Bilateral   . CERVICAL SPINE SURGERY     5-6, 6-7  . CHOLECYSTECTOMY    . HAMMER TOE SURGERY  Right   . TOTAL HIP 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   Procedure: LEFT TOTAL HIP ARTHROPLASTY -DIRECT ANTERIOR;  Surgeon: Eldred Manges, MD;  Location: [facility];  Service: Orthopedics;  Laterality: Left;   Social History   Occupational History  . Not on file  Tobacco Use  . Smoking status: Current Every Day Smoker    Packs/day: 0.50    Years: 23.00    Pack years: 11.50    Types: Cigarettes  . Smokeless tobacco: Never Used  Substance and Sexual Activity  . Alcohol use: Yes    Comment: occ  . Drug use: Never  . Sexual activity: Not Currently

## 2019-09-24 ENCOUNTER — Other Ambulatory Visit: Payer: Self-pay | Admitting: Surgery

## 2019-09-26 NOTE — Telephone Encounter (Signed)
Please advise 

## 2019-09-26 NOTE — Telephone Encounter (Signed)
Sydney [name] patient

## 2019-09-30 ENCOUNTER — Other Ambulatory Visit: Payer: Self-pay | Admitting: Orthopaedic Surgery

## 2019-09-30 ENCOUNTER — Telehealth: Payer: Self-pay | Admitting: Orthopaedic Surgery

## 2019-09-30 MED ORDER — HYDROCODONE-ACETAMINOPHEN 5-325 MG PO TABS: 1.0000 | ORAL_TABLET | Freq: Four times a day (QID) | ORAL | 0 refills | Status: DC | PRN

## 2019-09-30 NOTE — Telephone Encounter (Signed)
Patient called advised her gait is way off and it's causing her back pain to go crazy. Patient said by the end of the day she is in a lot of pain and the Advil is not working. The number to contact patient is [phone]

## 2019-09-30 NOTE — Telephone Encounter (Signed)
Please advise 

## 2019-09-30 NOTE — Progress Notes (Signed)
norco sent in back bothering her with gait with cane opposite to new hip

## 2019-10-13 ENCOUNTER — Encounter: Payer: Self-pay | Admitting: Orthopaedic Surgery

## 2019-10-13 ENCOUNTER — Ambulatory Visit (INDEPENDENT_AMBULATORY_CARE_PROVIDER_SITE_OTHER): Payer: 59 | Admitting: Orthopaedic Surgery

## 2019-10-13 VITALS — BP 158/104 | HR 78 | Ht 65.5 in | Wt 235.0 lb

## 2019-10-13 DIAGNOSIS — Z96642 Presence of left artificial hip joint: Secondary | ICD-10-CM

## 2019-10-13 NOTE — Progress Notes (Signed)
Post-Op Visit Note   Patient: Sydney [name]           Date of Birth: [birth year]           MRN: [id number] Visit Date: 10/13/2019 PCP: Janora Norlander, DO   Assessment & Plan: Weeks post left total hip arthroplasty opposite hip has significant arthritis and she wants to consider getting this done before summertime.  I will check her back again in 5 weeks and we can discuss when she like to proceed with a right total hip.  She is using a cane can walk without it but is just using it for balance she is cut way back on her pain medication.  Right hip is bothering her more.  Previous x-rays left hip look good and leg lengths are equal.  Chief Complaint:  Chief Complaint  Patient presents with  . Left Hip - Follow-up    08/31/2019 Left THA   Visit Diagnoses:  1. Status post total replacement of left hip     Plan: continue walking ROV 5 wks Follow-Up Instructions: Return in about 5 weeks (around 11/17/2019).   Orders:  No orders of the defined types were placed in this encounter.  No orders of the defined types were placed in this encounter.   Imaging: No results found.  PMFS History: Patient Active Problem List   Diagnosis Date Noted  . Status post total replacement of left hip 09/15/2019  . Back pain 07/01/2018  . DDD (degenerative disc disease), lumbosacral 07/01/2018  . Venous insufficiency of both lower extremities 11/17/2016  . 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 Morbid obesity with BMI of 40.0-44.9, adult ([place]) 07/28/2016  . Chronic pain syndrome 10/24/2015  . Dysfunction of both eustachian tubes 07/26/2014  . Calculus of left kidney 04/25/2014  . Impaired fasting glucose 04/05/2014  . History of bladder surgery 04/03/2014  . History of hysterectomy 04/03/2014  . Urge incontinence of urine 04/03/2014  . Asthma 07/28/2013  . Nontoxic uninodular goiter 07/28/2013  . Solitary pulmonary nodule 07/28/2013  . Lumbar radiculopathy  04/20/2013  . Insomnia 03/22/2013  . Essential hypertension 08/18/2011  . Tobacco use disorder 08/18/2011  . Moderate episode of recurrent major depressive disorder ([place]) 04/03/2011  . Other B-complex deficiencies 08/16/2010  . Family history of cerebrovascular accident (CVA) 04/17/2010  . Family history of malignant neoplasm of gastrointestinal tract 04/17/2010  . Family history of other cardiovascular diseases(V17.49) 04/17/2010   Past Medical History:  Diagnosis Date  . Allergy   . Anemia    history of  . Anxiety   . Arthritis   . Asthma   . Atherosclerosis   . Depression   . Family history of adverse reaction to anesthesia    pt's mother felt everything and couldn't speak during a gallbladder surgery  . GERD (gastroesophageal reflux disease)   . History of hiatal hernia   . History of kidney stones   . Hypertension   . Pneumonia    a few times  . TIA (transient ischemic attack) 2012    Family History  Problem Relation Age of Onset  . Anxiety disorder Mother   . Depression Mother   . Heart disease Mother   . Hypertension Mother   . Arrhythmia Mother   . Cancer Father   . Lung cancer Father   . Migraines Sister   . Alcohol abuse Brother   . Cancer Daughter        nerve sheath sarcoma  Past Surgical History:  Procedure Laterality Date  . ABDOMINAL HYSTERECTOMY     abdominal  . APPENDECTOMY    . bladder tack    . CARPAL TUNNEL RELEASE Bilateral   . CERVICAL SPINE SURGERY     5-6, 6-7  . CHOLECYSTECTOMY    . HAMMER TOE SURGERY Right   . TOTAL HIP 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   Procedure: LEFT TOTAL HIP ARTHROPLASTY -DIRECT ANTERIOR;  Surgeon: Eldred Manges, MD;  Location: [facility];  Service: Orthopedics;  Laterality: Left;   Social History   Occupational History  . Not on file  Tobacco Use  . Smoking status: Current Every Day Smoker    Packs/day: 0.50    Years: 23.00    Pack years: 11.50    Types: Cigarettes  . Smokeless tobacco: Never Used  Substance  and Sexual Activity  . Alcohol use: Yes    Comment: occ  . Drug use: Never  . Sexual activity: Not Currently

## 2019-11-17 ENCOUNTER — Other Ambulatory Visit: Payer: Self-pay

## 2019-11-17 ENCOUNTER — Ambulatory Visit (INDEPENDENT_AMBULATORY_CARE_PROVIDER_SITE_OTHER): Payer: 59 | Admitting: Orthopaedic Surgery

## 2019-11-17 ENCOUNTER — Encounter: Payer: Self-pay | Admitting: Orthopaedic Surgery

## 2019-11-17 VITALS — Ht 66.0 in | Wt 229.0 lb

## 2019-11-17 DIAGNOSIS — Z96642 Presence of left artificial hip joint: Secondary | ICD-10-CM

## 2019-11-17 DIAGNOSIS — M1611 Unilateral primary osteoarthritis, right hip: Secondary | ICD-10-CM | POA: Insufficient documentation

## 2019-11-17 NOTE — Progress Notes (Signed)
Office Visit Note   Patient: Sydney [name]           Date of Birth: 1961/04/21           MRN: [id number] Visit Date: 11/17/2019              Requested by: Janora Norlander, DO [place],  [place] [address] PCP: Janora Norlander, DO   Assessment & Plan: Visit Diagnoses:  1. Status post total replacement of left hip   2. Unilateral primary osteoarthritis, right hip     Plan: Patient might proceed to scheduling for her right total of arthroplasty.  Patient has a 52 Gripton cup and size 2 stem on left side.  Procedure discussed risk surgery discussed.  We reviewed previous x-rays as well as lumbar x-rays where she has some significant disc degeneration.  She has been working on a walking program and has lost weight since her left hip but now is having significant increased pain right hip with limping pain that bothers her at night and pain with daily activities.  Questions were elicited and answered she understands request to proceed.  Follow-Up Instructions: No follow-ups on file.   Orders:  No orders of the defined types were placed in this encounter.  No orders of the defined types were placed in this encounter.     Procedures: No procedures performed   Clinical Data: No additional findings.   Subjective: Chief Complaint  Patient presents with  . Right Hip - Pain    HPI 59 year old female returns post left total hip arthroplasty 08/31/2019 with good relief of preop pain.  She states she still has some numbness lateral to the incision in the proximal thigh.  Right hip osteoarthritis has been progressive and she states she is now progressed to the point where she is ready to schedule the right total of arthroplasty.  Patient had been on hydrocodone for hip pain.  She has had previous intra-articular injections without relief she is taken anti-inflammatories.  She is normally followed by Dr. Adam Phenix.   Review of Systems left total hip arthroplasty  08/31/2019.  Previous appendectomy C-section x2 hysterectomy gallbladder surgery all without problems.  Cervical fusion 2017 .  GU negative but positive past history of kidney stones.  Lumbar scoliosis with degenerative facet changes.  Previous morbid obesity BMI greater than 40 now 36.9.  Otherwise negative as pertains HPI.   Objective: Vital Signs: Ht 5\' 6"  (1.676 m)   Wt 229 lb (103.9 kg)   BMI 36.96 kg/m   Physical Exam Constitutional:      Appearance: She is well-developed.  HENT:     Head: Normocephalic.     Right Ear: External ear normal.     Left Ear: External ear normal.  Eyes:     Pupils: Pupils are equal, round, and reactive to light.  Neck:     Thyroid: No thyromegaly.     Trachea: No tracheal deviation.  Cardiovascular:     Rate and Rhythm: Normal rate.  Pulmonary:     Effort: Pulmonary effort is normal.  Abdominal:     Palpations: Abdomen is soft.  Skin:    General: Skin is warm and dry.  Neurological:     Mental Status: She is alert and oriented to person, place, and time.  Psychiatric:        Behavior: Behavior normal.     Ortho Exam well-healed left direct anterior hip incision.  Right hip has sharp pain  with internal rotation 20 degrees external rotation 40 degrees with pain.  She is amatory with a Trendelenburg gait on the right.  Good quad strength distal pulses are 2+.  She does have some sciatic notch tenderness both right and left.  Specialty Comments:  No specialty comments available.  Imaging: X-rays the right hip demonstrates marginal osteophyte subchondral sclerosis subchondral cyst formation and loss of joint space with flattening of the femoral head.  Satisfactory left total hip arthroplasty with standing x-ray showing identical leg lengths.   PMFS History: Patient Active Problem List   Diagnosis Date Noted  . Unilateral primary osteoarthritis, right hip 11/17/2019  . Status post total replacement of left hip 09/15/2019  . Back pain  07/01/2018  . DDD (degenerative disc disease), lumbosacral 07/01/2018  . Venous insufficiency of both lower extremities 11/17/2016  . 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 Morbid obesity with BMI of 40.0-44.9, adult (HCC) 07/28/2016  . Chronic pain syndrome 10/24/2015  . Dysfunction of both eustachian tubes 07/26/2014  . Calculus of left kidney 04/25/2014  . Impaired fasting glucose 04/05/2014  . History of bladder surgery 04/03/2014  . History of hysterectomy 04/03/2014  . Urge incontinence of urine 04/03/2014  . Asthma 07/28/2013  . Nontoxic uninodular goiter 07/28/2013  . Solitary pulmonary nodule 07/28/2013  . Lumbar radiculopathy 04/20/2013  . Insomnia 03/22/2013  . Essential hypertension 08/18/2011  . Tobacco use disorder 08/18/2011  . Moderate episode of recurrent major depressive disorder (HCC) 04/03/2011  . Other B-complex deficiencies 08/16/2010  . Family history of cerebrovascular accident (CVA) 04/17/2010  . Family history of malignant neoplasm of gastrointestinal tract 04/17/2010  . Family history of other cardiovascular diseases(V17.49) 04/17/2010   Past Medical History:  Diagnosis Date  . Allergy   . Anemia    history of  . Anxiety   . Arthritis   . Asthma   . Atherosclerosis   . Depression   . Family history of adverse reaction to anesthesia    pt's mother felt everything and couldn't speak during a gallbladder surgery  . GERD (gastroesophageal reflux disease)   . History of hiatal hernia   . History of kidney stones   . Hypertension   . Pneumonia    a few times  . TIA (transient ischemic attack) 2012    Family History  Problem Relation Age of Onset  . Anxiety disorder Mother   . Depression Mother   . Heart disease Mother   . Hypertension Mother   . Arrhythmia Mother   . Cancer Father   . Lung cancer Father   . Migraines Sister   . Alcohol abuse Brother   . Cancer Daughter        nerve sheath sarcoma    Past  Surgical History:  Procedure Laterality Date  . ABDOMINAL HYSTERECTOMY     abdominal  . APPENDECTOMY    . bladder tack    . CARPAL TUNNEL RELEASE Bilateral   . CERVICAL SPINE SURGERY     5-6, 6-7  . CHOLECYSTECTOMY    . HAMMER TOE SURGERY Right   . TOTAL HIP 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   Procedure: LEFT TOTAL HIP ARTHROPLASTY -DIRECT ANTERIOR;  Surgeon: Eldred Manges, MD;  Location: [facility] OR;  Service: Orthopedics;  Laterality: Left;   Social History   Occupational History  . Not on file  Tobacco Use  . Smoking status: Current Every Day Smoker    Packs/day: 0.50    Years: 23.00  Pack years: 11.50    Types: Cigarettes  . Smokeless tobacco: Never Used  Substance and Sexual Activity  . Alcohol use: Yes    Comment: occ  . Drug use: Never  . Sexual activity: Not Currently

## 2019-11-23 ENCOUNTER — Other Ambulatory Visit: Payer: Self-pay

## 2019-11-24 ENCOUNTER — Encounter: Payer: Self-pay | Admitting: Surgery

## 2019-11-24 ENCOUNTER — Ambulatory Visit (INDEPENDENT_AMBULATORY_CARE_PROVIDER_SITE_OTHER): Payer: 59 | Admitting: Surgery

## 2019-11-24 ENCOUNTER — Other Ambulatory Visit: Payer: Self-pay

## 2019-11-24 VITALS — BP 132/82 | HR 73 | Ht 65.0 in | Wt 233.0 lb

## 2019-11-24 DIAGNOSIS — M1611 Unilateral primary osteoarthritis, right hip: Secondary | ICD-10-CM

## 2019-11-24 NOTE — Progress Notes (Signed)
59 year old white female history of end-stage DJD right hip and pain comes in for preop evaluation.  Continues have ongoing right hip symptoms and wants to proceed with right total hip replacement as scheduled.  Today history and physical performed.  Review of systems negative.  Patient is familiar about what to expect from surgery since she just had left total hip replacement done February 2021.  All questions answered today.

## 2019-12-01 ENCOUNTER — Other Ambulatory Visit (HOSPITAL_COMMUNITY)
Admission: RE | Admit: 2019-12-01 | Discharge: 2019-12-01 | Disposition: A | Discharge status: Home / Self Care | Payer: 59 | Source: Ambulatory Visit | Attending: Orthopaedic Surgery | Admitting: Orthopaedic Surgery

## 2019-12-01 ENCOUNTER — Other Ambulatory Visit: Payer: Self-pay

## 2019-12-01 DIAGNOSIS — Z20822 Contact with and (suspected) exposure to covid-19: Secondary | ICD-10-CM | POA: Insufficient documentation

## 2019-12-01 DIAGNOSIS — Z01812 Encounter for preprocedural laboratory examination: Secondary | ICD-10-CM | POA: Diagnosis not present

## 2019-12-01 LAB — SARS CORONAVIRUS 2 (TAT 6-24 HRS): SARS Coronavirus 2: NEGATIVE

## 2019-12-01 NOTE — Progress Notes (Signed)
[id number] [name], [place] - [address] [place] [address] Phone: [phone] Fax: [phone]  [facility] - [place], [place] - [address] AT Portal to [facility] [address] [place] [address] Phone: [phone] Fax: [phone]      Your procedure is scheduled on Monday, Dec 05, 2019.  Report to [facility] Main Entrance "A" at 10:30 A.M., and check in at the Admitting office.  Call this number if you have problems the morning of surgery:  [phone]  Call [phone] if you have any questions prior to your surgery date Monday-Friday 8am-4pm    Remember:  Do not eat after midnight the night before your surgery  You may drink clear liquids until 9:30 AM the morning of your surgery.   Clear liquids allowed are: Water, Non-Citrus Juices (without pulp), Carbonated Beverages, Clear Tea, Black Coffee Only, and Gatorade   Enhanced Recovery after Surgery for Orthopedics Enhanced Recovery after Surgery is a protocol used to improve the stress on your body and your recovery after surgery.  Patient Instructions  . The night before surgery:  o No food after midnight. ONLY clear liquids after midnight  .  Marland Kitchen The day of surgery (if you do NOT have diabetes):  o Drink ONE (1) Pre-Surgery Clear Ensure as directed.   o This drink was given to you during your hospital  pre-op appointment visit. o The pre-op nurse will instruct you on the time to drink the  Pre-Surgery Ensure depending on your surgery time. o Finish the drink by 9:30 AM. DO NOT SIP. o Nothing else to drink after completing the  Pre-Surgery Clear Ensure.    Take these medicines the morning of surgery with A SIP OF WATER:  amLODipine (NORVASC)  fexofenadine (ALLEGRA)  metoprolol succinate (TOPROL-XL) sertraline (ZOLOFT)  If needed: albuterol (PROAIR HFA)  HYDROcodone-acetaminophen  (NORCO/VICODIN) methocarbamol (ROBAXIN) oxyCODONE-acetaminophen (PERCOCET/ROXICET)  As of today, STOP taking any Aspirin (unless otherwise instructed by your surgeon) and Aspirin containing products, Aleve, Naproxen, Ibuprofen, Motrin, Advil, Goody's, BC's, all herbal medications, fish oil, and all vitamins.                      Do not wear jewelry, make up, or nail polish            Do not wear lotions, powders, perfumes, or deodorant.            Do not shave 48 hours prior to surgery.            Do not bring valuables to the hospital.            [facility] is not responsible for any belongings or valuables.  Do NOT Smoke (Tobacco/Vapping) or drink Alcohol 24 hours prior to your procedure If you use a CPAP at night, you may bring all equipment for your overnight stay.   Contacts, glasses, dentures or bridgework may not be worn into surgery.      For patients admitted to the hospital, discharge time will be determined by your treatment team.   Patients discharged the day of surgery will not be allowed to drive home, and someone needs to stay with them for 24 hours.    Special instructions:   [name]- Preparing For Surgery  Before surgery, you can play an important role. Because skin is not sterile, your skin needs to be as  free of germs as possible. You can reduce the number of germs on your skin by washing with CHG (chlorahexidine gluconate) Soap before surgery.  CHG is an antiseptic cleaner which kills germs and bonds with the skin to continue killing germs even after washing.    Oral Hygiene is also important to reduce your risk of infection.  Remember - BRUSH YOUR TEETH THE MORNING OF SURGERY WITH YOUR REGULAR TOOTHPASTE  Please do not use if you have an allergy to CHG or antibacterial soaps. If your skin becomes reddened/irritated stop using the CHG.  Do not shave (including legs and underarms) for at least 48 hours prior to first CHG shower. It is OK to shave your  face.  Please follow these instructions carefully.   1. Shower the NIGHT BEFORE SURGERY and the MORNING OF SURGERY with CHG Soap.   2. If you chose to wash your hair, wash your hair first as usual with your normal shampoo.  3. After you shampoo, rinse your hair and body thoroughly to remove the shampoo.  4. Use CHG as you would any other liquid soap. You can apply CHG directly to the skin and wash gently with a scrungie or a clean washcloth.   5. Apply the CHG Soap to your body ONLY FROM THE NECK DOWN.  Do not use on open wounds or open sores. Avoid contact with your eyes, ears, mouth and genitals (private parts). Wash Face and genitals (private parts)  with your normal soap.   6. Wash thoroughly, paying special attention to the area where your surgery will be performed.  7. Thoroughly rinse your body with warm water from the neck down.  8. DO NOT shower/wash with your normal soap after using and rinsing off the CHG Soap.  9. Pat yourself dry with a CLEAN TOWEL.  10. Wear CLEAN PAJAMAS to bed the night before surgery, wear comfortable clothes the morning of surgery  11. Place CLEAN SHEETS on your bed the night of your first shower and DO NOT SLEEP WITH PETS.   Day of Surgery:   Do not apply any deodorants/lotions.  Please wear clean clothes to the hospital/surgery center.   Remember to brush your teeth WITH YOUR REGULAR TOOTHPASTE.   Please read over the following fact sheets that you were given.

## 2019-12-02 ENCOUNTER — Other Ambulatory Visit: Payer: Self-pay | Admitting: *Deleted

## 2019-12-02 ENCOUNTER — Encounter (HOSPITAL_COMMUNITY)
Admission: RE | Admit: 2019-12-02 | Discharge: 2019-12-02 | Disposition: A | Discharge status: Home / Self Care | Payer: 59 | Source: Ambulatory Visit | Attending: Surgery | Admitting: Surgery

## 2019-12-02 ENCOUNTER — Other Ambulatory Visit: Payer: Self-pay

## 2019-12-02 ENCOUNTER — Encounter (HOSPITAL_COMMUNITY)
Admission: RE | Admit: 2019-12-02 | Discharge: 2019-12-02 | Disposition: A | Discharge status: Home / Self Care | Payer: 59 | Source: Ambulatory Visit | Attending: Orthopaedic Surgery | Admitting: Orthopaedic Surgery

## 2019-12-02 ENCOUNTER — Encounter (HOSPITAL_COMMUNITY): Payer: Self-pay

## 2019-12-02 DIAGNOSIS — Z01818 Encounter for other preprocedural examination: Secondary | ICD-10-CM

## 2019-12-02 LAB — URINALYSIS, ROUTINE W REFLEX MICROSCOPIC
Bilirubin Urine: NEGATIVE
Glucose, UA: NEGATIVE mg/dL
Ketones, ur: NEGATIVE mg/dL
Leukocytes,Ua: NEGATIVE
Nitrite: NEGATIVE
Protein, ur: NEGATIVE mg/dL
Specific Gravity, Urine: 1.005 (ref 1.005–1.030)
pH: 6 (ref 5.0–8.0)

## 2019-12-02 LAB — COMPREHENSIVE METABOLIC PANEL
ALT: 14 U/L (ref 0–44)
AST: 15 U/L (ref 15–41)
Albumin: 3.6 g/dL (ref 3.5–5.0)
Alkaline Phosphatase: 88 U/L (ref 38–126)
Anion gap: 7 (ref 5–15)
BUN: 9 mg/dL (ref 6–20)
CO2: 28 mmol/L (ref 22–32)
Calcium: 9 mg/dL (ref 8.9–10.3)
Chloride: 104 mmol/L (ref 98–111)
Creatinine, Ser: 0.66 mg/dL (ref 0.44–1.00)
GFR calc Af Amer: >60 mL/min (ref >60)
GFR calc non Af Amer: >60 mL/min (ref >60)
Glucose, Bld: 88 mg/dL (ref 70–99)
Potassium: 4.4 mmol/L (ref 3.5–5.1)
Sodium: 139 mmol/L (ref 135–145)
Total Bilirubin: 0.6 mg/dL (ref 0.3–1.2)
Total Protein: 6.7 g/dL (ref 6.5–8.1)

## 2019-12-02 LAB — SURGICAL PCR SCREEN
MRSA, PCR: NEGATIVE
Staphylococcus aureus: NEGATIVE

## 2019-12-02 LAB — CBC
HCT: 47.3 % — ABNORMAL HIGH (ref 36.0–46.0)
Hemoglobin: 14.6 g/dL (ref 12.0–15.0)
MCH: 28.8 pg (ref 26.0–34.0)
MCHC: 30.9 g/dL (ref 30.0–36.0)
MCV: 93.3 fL (ref 80.0–100.0)
Platelets: 251 10*3/uL (ref 150–400)
RBC: 5.07 MIL/uL (ref 3.87–5.11)
RDW: 14.1 % (ref 11.5–15.5)
WBC: 5.9 10*3/uL (ref 4.0–10.5)
nRBC: 0 % (ref 0.0–0.2)

## 2019-12-02 MED ORDER — ALBUTEROL SULFATE HFA 108 (90 BASE) MCG/ACT IN AERS: 2.0000 | INHALATION_SPRAY | Freq: Four times a day (QID) | RESPIRATORY_TRACT | 2 refills | Status: DC | PRN

## 2019-12-02 MED ORDER — VANCOMYCIN HCL 1500 MG/300ML IV SOLN
1500.0000 mg | INTRAVENOUS | Status: AC
  Administered 2019-12-05: 1500 mg via INTRAVENOUS
  Filled 2019-12-02 (×2): qty 300

## 2019-12-02 MED ORDER — BUPIVACAINE LIPOSOME 1.3 % IJ SUSP
10.0000 mL | Freq: Once | INTRAMUSCULAR | Status: DC
  Filled 2019-12-02: qty 20

## 2019-12-02 NOTE — Anesthesia Preprocedure Evaluation (Addendum)
Anesthesia Evaluation  Patient identified by MRN, date of birth, ID band Patient awake    Reviewed: Allergy & Precautions, NPO status , Patient's Chart, lab work & pertinent test results  Airway Mallampati: II  TM Distance: >3 FB Neck ROM: Full    Dental  (+) Teeth Intact, Dental Advisory Given   Pulmonary Current Smoker,    breath sounds clear to auscultation       Cardiovascular hypertension,  Rhythm:Regular Rate:Normal     Neuro/Psych    GI/Hepatic   Endo/Other    Renal/GU      Musculoskeletal   Abdominal (+) + obese,   Peds  Hematology   Anesthesia Other Findings   Reproductive/Obstetrics                            Anesthesia Physical Anesthesia Plan  ASA: III  Anesthesia Plan: MAC and Spinal   Post-op Pain Management:    Induction: Intravenous  PONV Risk Score and Plan: Ondansetron and Dexamethasone  Airway Management Planned: Natural Airway and Simple Face Mask  Additional Equipment:   Intra-op Plan:   Post-operative Plan:   Informed Consent: I have reviewed the patients History and Physical, chart, labs and discussed the procedure including the risks, benefits and alternatives for the proposed anesthesia with the patient or authorized representative who has indicated his/her understanding and acceptance.     Dental advisory given  Plan Discussed with: CRNA and Anesthesiologist  Anesthesia Plan Comments: (Clearance from PCP Dr. Lajuana Ripple dated 11/21/19 on pt chart. )       Anesthesia Quick Evaluation

## 2019-12-02 NOTE — Progress Notes (Signed)
PCP - Dr. Delynn Flavin Cardiologist - Denies  PPM/ICD - Denies  Chest x-ray - 12/02/19 EKG - 12/02/19 Stress Test - Denies ECHO - Denies Cardiac Cath - Denies  Sleep Study - Denies  Pt denies being diabetic.  Blood Thinner Instructions: N/A Aspirin Instructions: LD - 09/28/19  ERAS Protcol - Yes, PRE-SURGERY Ensure  COVID TEST- 12/01/19 negative   Coronavirus Screening  Have you experienced the following symptoms:  Cough yes/no: No Fever (>100.53F)  yes/no: No Runny nose yes/no: No Sore throat yes/no: No Difficulty breathing/shortness of breath  yes/no: No  Have you or a family member traveled in the last 14 days and where? yes/no: No   If the patient indicates "YES" to the above questions, their PAT will be rescheduled to limit the exposure to others and, the surgeon will be notified. THE PATIENT WILL NEED TO BE ASYMPTOMATIC FOR 14 DAYS.   If the patient is not experiencing any of these symptoms, the PAT nurse will instruct them to NOT bring anyone with them to their appointment since they may have these symptoms or traveled as well.   Please remind your patients and families that hospital visitation restrictions are in effect and the importance of the restrictions.     Anesthesia review: Yes, clearance requested  Patient denies shortness of breath, fever, cough and chest pain at PAT appointment   All instructions explained to the patient, with a verbal understanding of the material. Patient agrees to go over the instructions while at home for a better understanding. Patient also instructed to self quarantine after being tested for COVID-19. The opportunity to ask questions was provided.

## 2019-12-02 NOTE — Telephone Encounter (Signed)
[facility] Request New Rx for Albuteral [facility] Inhaler Rx has expired through mail order Needs for surgery which is Monday  Last OV was 04/15/19 for acute, before that it was 10/01/18. Last Rf 07/01/18

## 2019-12-05 ENCOUNTER — Other Ambulatory Visit: Payer: Self-pay

## 2019-12-05 ENCOUNTER — Encounter (HOSPITAL_COMMUNITY): Payer: Self-pay | Admitting: Orthopaedic Surgery

## 2019-12-05 ENCOUNTER — Ambulatory Visit (HOSPITAL_COMMUNITY): Payer: No Typology Code available for payment source | Admitting: Physician Assistant

## 2019-12-05 ENCOUNTER — Ambulatory Visit (HOSPITAL_COMMUNITY): Payer: No Typology Code available for payment source

## 2019-12-05 ENCOUNTER — Encounter (HOSPITAL_COMMUNITY)
Admission: AD | Disposition: A | Discharge status: Home / Self Care | Payer: Self-pay | Source: Home / Self Care | Attending: Orthopaedic Surgery

## 2019-12-05 ENCOUNTER — Ambulatory Visit (HOSPITAL_COMMUNITY): Payer: No Typology Code available for payment source | Admitting: Anesthesiology

## 2019-12-05 ENCOUNTER — Inpatient Hospital Stay (HOSPITAL_COMMUNITY)
Admission: AD | Admit: 2019-12-05 | Discharge: 2019-12-08 | DRG: 470 | Disposition: A | Discharge status: Home / Self Care | Payer: No Typology Code available for payment source | Attending: Orthopaedic Surgery | Admitting: Orthopaedic Surgery

## 2019-12-05 DIAGNOSIS — Z91030 Bee allergy status: Secondary | ICD-10-CM

## 2019-12-05 DIAGNOSIS — F17210 Nicotine dependence, cigarettes, uncomplicated: Secondary | ICD-10-CM | POA: Diagnosis present

## 2019-12-05 DIAGNOSIS — I10 Essential (primary) hypertension: Secondary | ICD-10-CM | POA: Diagnosis present

## 2019-12-05 DIAGNOSIS — Z7982 Long term (current) use of aspirin: Secondary | ICD-10-CM

## 2019-12-05 DIAGNOSIS — M5137 Other intervertebral disc degeneration, lumbosacral region: Secondary | ICD-10-CM | POA: Diagnosis present

## 2019-12-05 DIAGNOSIS — K219 Gastro-esophageal reflux disease without esophagitis: Secondary | ICD-10-CM | POA: Diagnosis present

## 2019-12-05 DIAGNOSIS — Z96649 Presence of unspecified artificial hip joint: Secondary | ICD-10-CM

## 2019-12-05 DIAGNOSIS — Z823 Family history of stroke: Secondary | ICD-10-CM

## 2019-12-05 DIAGNOSIS — Z818 Family history of other mental and behavioral disorders: Secondary | ICD-10-CM

## 2019-12-05 DIAGNOSIS — Z8673 Personal history of transient ischemic attack (TIA), and cerebral infarction without residual deficits: Secondary | ICD-10-CM

## 2019-12-05 DIAGNOSIS — Z9181 History of falling: Secondary | ICD-10-CM

## 2019-12-05 DIAGNOSIS — Z888 Allergy status to other drugs, medicaments and biological substances status: Secondary | ICD-10-CM

## 2019-12-05 DIAGNOSIS — F331 Major depressive disorder, recurrent, moderate: Secondary | ICD-10-CM | POA: Diagnosis present

## 2019-12-05 DIAGNOSIS — M1611 Unilateral primary osteoarthritis, right hip: Principal | ICD-10-CM | POA: Diagnosis present

## 2019-12-05 DIAGNOSIS — Z79899 Other long term (current) drug therapy: Secondary | ICD-10-CM

## 2019-12-05 DIAGNOSIS — Z09 Encounter for follow-up examination after completed treatment for conditions other than malignant neoplasm: Secondary | ICD-10-CM

## 2019-12-05 DIAGNOSIS — Z87892 Personal history of anaphylaxis: Secondary | ICD-10-CM

## 2019-12-05 DIAGNOSIS — Z6841 Body Mass Index (BMI) 40.0 and over, adult: Secondary | ICD-10-CM

## 2019-12-05 DIAGNOSIS — Z419 Encounter for procedure for purposes other than remedying health state, unspecified: Secondary | ICD-10-CM

## 2019-12-05 DIAGNOSIS — Z87442 Personal history of urinary calculi: Secondary | ICD-10-CM

## 2019-12-05 DIAGNOSIS — Z885 Allergy status to narcotic agent status: Secondary | ICD-10-CM

## 2019-12-05 DIAGNOSIS — G894 Chronic pain syndrome: Secondary | ICD-10-CM | POA: Diagnosis present

## 2019-12-05 DIAGNOSIS — Z880 Allergy status to penicillin: Secondary | ICD-10-CM

## 2019-12-05 DIAGNOSIS — F419 Anxiety disorder, unspecified: Secondary | ICD-10-CM | POA: Diagnosis present

## 2019-12-05 DIAGNOSIS — Z8701 Personal history of pneumonia (recurrent): Secondary | ICD-10-CM

## 2019-12-05 DIAGNOSIS — J45909 Unspecified asthma, uncomplicated: Secondary | ICD-10-CM | POA: Diagnosis present

## 2019-12-05 DIAGNOSIS — M94251 Chondromalacia, right hip: Secondary | ICD-10-CM | POA: Diagnosis present

## 2019-12-05 DIAGNOSIS — Z882 Allergy status to sulfonamides status: Secondary | ICD-10-CM

## 2019-12-05 DIAGNOSIS — Z8249 Family history of ischemic heart disease and other diseases of the circulatory system: Secondary | ICD-10-CM

## 2019-12-05 HISTORY — PX: TOTAL HIP ARTHROPLASTY: SHX124

## 2019-12-05 SURGERY — ARTHROPLASTY, HIP, TOTAL, ANTERIOR APPROACH
Anesthesia: Monitor Anesthesia Care | Site: Hip | Laterality: Right

## 2019-12-05 MED ORDER — ASPIRIN EC 325 MG PO TBEC
325.0000 mg | DELAYED_RELEASE_TABLET | Freq: Every day | ORAL | Status: DC
  Administered 2019-12-06 – 2019-12-08 (×3): 325 mg via ORAL
  Filled 2019-12-05 (×3): qty 1

## 2019-12-05 MED ORDER — METOPROLOL SUCCINATE ER 25 MG PO TB24
25.0000 mg | ORAL_TABLET | Freq: Every day | ORAL | Status: DC
  Administered 2019-12-06 – 2019-12-08 (×3): 25 mg via ORAL
  Filled 2019-12-05 (×3): qty 1

## 2019-12-05 MED ORDER — METHOCARBAMOL 500 MG PO TABS
500.0000 mg | ORAL_TABLET | Freq: Four times a day (QID) | ORAL | Status: DC | PRN
  Administered 2019-12-05 – 2019-12-08 (×8): 500 mg via ORAL
  Filled 2019-12-05 (×8): qty 1

## 2019-12-05 MED ORDER — BUPIVACAINE HCL (PF) 0.25 % IJ SOLN
INTRAMUSCULAR | Status: AC
  Filled 2019-12-05: qty 30

## 2019-12-05 MED ORDER — 0.9 % SODIUM CHLORIDE (POUR BTL) OPTIME
TOPICAL | Status: DC | PRN
  Administered 2019-12-05: 1000 mL

## 2019-12-05 MED ORDER — TRANEXAMIC ACID-NACL 1000-0.7 MG/100ML-% IV SOLN
INTRAVENOUS | Status: AC
  Filled 2019-12-05: qty 100

## 2019-12-05 MED ORDER — TRANEXAMIC ACID-NACL 1000-0.7 MG/100ML-% IV SOLN
INTRAVENOUS | Status: DC | PRN
Start: 2019-12-05 — End: 2019-12-05
  Administered 2019-12-05: 1000 mg via INTRAVENOUS

## 2019-12-05 MED ORDER — METOCLOPRAMIDE HCL 5 MG/ML IJ SOLN: 5.0000 mg | Freq: Three times a day (TID) | INTRAMUSCULAR | Status: DC | PRN

## 2019-12-05 MED ORDER — EPINEPHRINE PF 1 MG/ML IJ SOLN
INTRAMUSCULAR | Status: AC
  Filled 2019-12-05: qty 1

## 2019-12-05 MED ORDER — DEXAMETHASONE SODIUM PHOSPHATE 10 MG/ML IJ SOLN
INTRAMUSCULAR | Status: DC | PRN
Start: 2019-12-05 — End: 2019-12-05
  Administered 2019-12-05: 5 mg via INTRAVENOUS

## 2019-12-05 MED ORDER — PHENYLEPHRINE HCL (PRESSORS) 10 MG/ML IV SOLN
INTRAVENOUS | Status: DC | PRN
  Administered 2019-12-05: 40 ug via INTRAVENOUS
  Administered 2019-12-05 (×3): 80 ug via INTRAVENOUS
  Administered 2019-12-05: 40 ug via INTRAVENOUS

## 2019-12-05 MED ORDER — PROPOFOL 10 MG/ML IV BOLUS
INTRAVENOUS | Status: DC | PRN
  Administered 2019-12-05: 20 mg via INTRAVENOUS

## 2019-12-05 MED ORDER — FENTANYL CITRATE (PF) 250 MCG/5ML IJ SOLN
INTRAMUSCULAR | Status: AC
  Filled 2019-12-05: qty 5

## 2019-12-05 MED ORDER — TRAZODONE HCL 100 MG PO TABS
200.0000 mg | ORAL_TABLET | Freq: Every day | ORAL | Status: DC
  Administered 2019-12-05 – 2019-12-07 (×3): 200 mg via ORAL
  Filled 2019-12-05 (×5): qty 2

## 2019-12-05 MED ORDER — DOCUSATE SODIUM 100 MG PO CAPS
100.0000 mg | ORAL_CAPSULE | Freq: Two times a day (BID) | ORAL | Status: DC
  Administered 2019-12-05 – 2019-12-08 (×6): 100 mg via ORAL
  Filled 2019-12-05 (×6): qty 1

## 2019-12-05 MED ORDER — ALBUMIN HUMAN 5 % IV SOLN: INTRAVENOUS | Status: DC | PRN

## 2019-12-05 MED ORDER — FENTANYL CITRATE (PF) 100 MCG/2ML IJ SOLN
25.0000 ug | INTRAMUSCULAR | Status: DC | PRN
  Administered 2019-12-05: 50 ug via INTRAVENOUS
  Administered 2019-12-05: 25 ug via INTRAVENOUS

## 2019-12-05 MED ORDER — MIDAZOLAM HCL 5 MG/5ML IJ SOLN
INTRAMUSCULAR | Status: DC | PRN
  Administered 2019-12-05 (×2): 1 mg via INTRAVENOUS

## 2019-12-05 MED ORDER — EPHEDRINE SULFATE 50 MG/ML IJ SOLN
INTRAMUSCULAR | Status: DC | PRN
Start: 2019-12-05 — End: 2019-12-05
  Administered 2019-12-05: 5 mg via INTRAVENOUS
  Administered 2019-12-05: 10 mg via INTRAVENOUS

## 2019-12-05 MED ORDER — METOCLOPRAMIDE HCL 5 MG PO TABS: 5.0000 mg | ORAL_TABLET | Freq: Three times a day (TID) | ORAL | Status: DC | PRN

## 2019-12-05 MED ORDER — FENTANYL CITRATE (PF) 100 MCG/2ML IJ SOLN
INTRAMUSCULAR | Status: AC
  Filled 2019-12-05: qty 2

## 2019-12-05 MED ORDER — LORATADINE 10 MG PO TABS
10.0000 mg | ORAL_TABLET | Freq: Every day | ORAL | Status: DC
  Administered 2019-12-06 – 2019-12-08 (×3): 10 mg via ORAL
  Filled 2019-12-05 (×3): qty 1

## 2019-12-05 MED ORDER — ONDANSETRON HCL 4 MG/2ML IJ SOLN: 4.0000 mg | Freq: Four times a day (QID) | INTRAMUSCULAR | Status: DC | PRN

## 2019-12-05 MED ORDER — SERTRALINE HCL 100 MG PO TABS
100.0000 mg | ORAL_TABLET | Freq: Every day | ORAL | Status: DC
  Administered 2019-12-06 – 2019-12-08 (×3): 100 mg via ORAL
  Filled 2019-12-05 (×3): qty 1

## 2019-12-05 MED ORDER — MENTHOL 3 MG MT LOZG: 1.0000 | LOZENGE | OROMUCOSAL | Status: DC | PRN

## 2019-12-05 MED ORDER — OXYCODONE HCL 5 MG PO TABS
5.0000 mg | ORAL_TABLET | ORAL | Status: DC | PRN
  Administered 2019-12-05 – 2019-12-07 (×6): 10 mg via ORAL
  Administered 2019-12-07: 5 mg via ORAL
  Administered 2019-12-08 (×3): 10 mg via ORAL
  Filled 2019-12-05 (×2): qty 2
  Filled 2019-12-05: qty 1
  Filled 2019-12-05 (×7): qty 2
  Filled 2019-12-05: qty 1

## 2019-12-05 MED ORDER — POLYETHYLENE GLYCOL 3350 17 G PO PACK: 17.0000 g | PACK | Freq: Every day | ORAL | Status: DC | PRN

## 2019-12-05 MED ORDER — FENTANYL CITRATE (PF) 100 MCG/2ML IJ SOLN
INTRAMUSCULAR | Status: DC | PRN
  Administered 2019-12-05: 50 ug via INTRAVENOUS
  Administered 2019-12-05 (×2): 100 ug via INTRAVENOUS

## 2019-12-05 MED ORDER — BUPIVACAINE LIPOSOME 1.3 % IJ SUSP
20.0000 mL | Freq: Once | INTRAMUSCULAR | Status: DC
  Filled 2019-12-05: qty 20

## 2019-12-05 MED ORDER — HYDROMORPHONE HCL 1 MG/ML IJ SOLN
0.5000 mg | INTRAMUSCULAR | Status: DC | PRN
  Administered 2019-12-05 – 2019-12-07 (×4): 0.5 mg via INTRAVENOUS
  Filled 2019-12-05 (×4): qty 1

## 2019-12-05 MED ORDER — MIDAZOLAM HCL 2 MG/2ML IJ SOLN
INTRAMUSCULAR | Status: AC
  Filled 2019-12-05: qty 2

## 2019-12-05 MED ORDER — AMLODIPINE BESYLATE 5 MG PO TABS
5.0000 mg | ORAL_TABLET | Freq: Every day | ORAL | Status: DC
  Filled 2019-12-05 (×2): qty 1

## 2019-12-05 MED ORDER — SODIUM CHLORIDE 0.9 % IV SOLN: INTRAVENOUS | Status: DC

## 2019-12-05 MED ORDER — ONDANSETRON HCL 4 MG/2ML IJ SOLN: 4.0000 mg | Freq: Once | INTRAMUSCULAR | Status: DC | PRN

## 2019-12-05 MED ORDER — GLYCOPYRROLATE PF 0.2 MG/ML IJ SOSY
PREFILLED_SYRINGE | INTRAMUSCULAR | Status: DC | PRN
  Administered 2019-12-05: .2 mg via INTRAVENOUS

## 2019-12-05 MED ORDER — ONDANSETRON HCL 4 MG PO TABS: 4.0000 mg | ORAL_TABLET | Freq: Four times a day (QID) | ORAL | Status: DC | PRN

## 2019-12-05 MED ORDER — ALBUTEROL SULFATE (2.5 MG/3ML) 0.083% IN NEBU: 2.5000 mg | INHALATION_SOLUTION | Freq: Four times a day (QID) | RESPIRATORY_TRACT | Status: DC | PRN

## 2019-12-05 MED ORDER — BUPIVACAINE LIPOSOME 1.3 % IJ SUSP
INTRAMUSCULAR | Status: DC | PRN
  Administered 2019-12-05: 20 mL

## 2019-12-05 MED ORDER — PHENOL 1.4 % MT LIQD: 1.0000 | OROMUCOSAL | Status: DC | PRN

## 2019-12-05 MED ORDER — PHENYLEPHRINE HCL-NACL 10-0.9 MG/250ML-% IV SOLN
INTRAVENOUS | Status: DC | PRN
  Administered 2019-12-05: 50 ug/min via INTRAVENOUS

## 2019-12-05 MED ORDER — SUGAMMADEX SODIUM 200 MG/2ML IV SOLN
INTRAVENOUS | Status: DC | PRN
  Administered 2019-12-05: 200 mg via INTRAVENOUS

## 2019-12-05 MED ORDER — ACETAMINOPHEN 325 MG PO TABS
325.0000 mg | ORAL_TABLET | Freq: Four times a day (QID) | ORAL | Status: DC | PRN
  Administered 2019-12-06: 650 mg via ORAL
  Filled 2019-12-05: qty 2

## 2019-12-05 MED ORDER — METHOCARBAMOL 1000 MG/10ML IJ SOLN
500.0000 mg | Freq: Four times a day (QID) | INTRAVENOUS | Status: DC | PRN
  Filled 2019-12-05: qty 5

## 2019-12-05 MED ORDER — BUPIVACAINE HCL (PF) 0.25 % IJ SOLN
INTRAMUSCULAR | Status: DC | PRN
  Administered 2019-12-05: 20 mL

## 2019-12-05 MED ORDER — LACTATED RINGERS IV SOLN: INTRAVENOUS | Status: DC

## 2019-12-05 MED ORDER — ROCURONIUM BROMIDE 10 MG/ML (PF) SYRINGE
PREFILLED_SYRINGE | INTRAVENOUS | Status: DC | PRN
  Administered 2019-12-05: 60 mg via INTRAVENOUS

## 2019-12-05 MED ORDER — METHOCARBAMOL 500 MG PO TABS
ORAL_TABLET | ORAL | Status: AC
  Filled 2019-12-05: qty 1

## 2019-12-05 SURGICAL SUPPLY — 50 items
BENZOIN TINCTURE PRP APPL 2/3 (GAUZE/BANDAGES/DRESSINGS) ×2 IMPLANT
BLADE SAW SGTL 18X1.27X75 (BLADE) ×2 IMPLANT
CELLS DAT CNTRL 66122 CELL SVR (MISCELLANEOUS) ×1 IMPLANT
COVER SURGICAL LIGHT HANDLE (MISCELLANEOUS) ×2 IMPLANT
COVER WAND RF STERILE (DRAPES) ×2 IMPLANT
CUP PINN GRIPTON 54 100 (Cup) ×2 IMPLANT
DRAPE C-ARM 42X72 X-RAY (DRAPES) ×2 IMPLANT
DRAPE IMP U-DRAPE 54X76 (DRAPES) ×2 IMPLANT
DRAPE STERI IOBAN 125X83 (DRAPES) ×2 IMPLANT
DRAPE U-SHAPE 47X51 STRL (DRAPES) ×2 IMPLANT
DRSG MEPILEX BORDER 4X12 (GAUZE/BANDAGES/DRESSINGS) ×2 IMPLANT
DRSG MEPILEX BORDER 4X8 (GAUZE/BANDAGES/DRESSINGS) ×2 IMPLANT
DURAPREP 26ML APPLICATOR (WOUND CARE) ×2 IMPLANT
ELECT BLADE 4.0 EZ CLEAN MEGAD (MISCELLANEOUS) ×2
ELECT CAUTERY BLADE 6.4 (BLADE) ×2 IMPLANT
ELECT REM PT RETURN 9FT ADLT (ELECTROSURGICAL) ×2
ELECTRODE BLDE 4.0 EZ CLN MEGD (MISCELLANEOUS) ×1 IMPLANT
ELECTRODE REM PT RTRN 9FT ADLT (ELECTROSURGICAL) ×1 IMPLANT
ELIMINATOR HOLE APEX DEPUY (Hips) ×2 IMPLANT
FACESHIELD WRAPAROUND (MASK) ×4 IMPLANT
GLOVE BIOGEL PI IND STRL 8 (GLOVE) ×2 IMPLANT
GLOVE BIOGEL PI INDICATOR 8 (GLOVE) ×2
GLOVE ORTHO TXT STRL SZ7.5 (GLOVE) ×4 IMPLANT
GOWN STRL REUS W/ TWL LRG LVL3 (GOWN DISPOSABLE) ×1 IMPLANT
GOWN STRL REUS W/ TWL XL LVL3 (GOWN DISPOSABLE) ×1 IMPLANT
GOWN STRL REUS W/TWL 2XL LVL3 (GOWN DISPOSABLE) ×2 IMPLANT
GOWN STRL REUS W/TWL LRG LVL3 (GOWN DISPOSABLE) ×1
GOWN STRL REUS W/TWL XL LVL3 (GOWN DISPOSABLE) ×1
HEAD CERAMIC 36 PLUS5 (Hips) ×2 IMPLANT
KIT BASIN OR (CUSTOM PROCEDURE TRAY) ×2 IMPLANT
KIT TURNOVER KIT B (KITS) ×2 IMPLANT
LINER NEUTRAL 54X36MM PLUS 4 (Hips) ×2 IMPLANT
MANIFOLD NEPTUNE II (INSTRUMENTS) ×2 IMPLANT
NS IRRIG 1000ML POUR BTL (IV SOLUTION) ×2 IMPLANT
PACK TOTAL JOINT (CUSTOM PROCEDURE TRAY) ×2 IMPLANT
PAD ARMBOARD 7.5X6 YLW CONV (MISCELLANEOUS) ×4 IMPLANT
RTRCTR WOUND ALEXIS 18CM MED (MISCELLANEOUS) ×2
STEM FEM ACTIS STD SZ2 (Stem) ×2 IMPLANT
STRIP CLOSURE SKIN 1/2X4 (GAUZE/BANDAGES/DRESSINGS) ×2 IMPLANT
SUT VIC AB 0 CT1 27 (SUTURE) ×2
SUT VIC AB 0 CT1 27XBRD ANBCTR (SUTURE) ×2 IMPLANT
SUT VIC AB 2-0 CT1 27 (SUTURE) ×2
SUT VIC AB 2-0 CT1 TAPERPNT 27 (SUTURE) ×2 IMPLANT
SUT VICRYL 4-0 PS2 18IN ABS (SUTURE) ×2 IMPLANT
SUT VLOC 180 0 24IN GS25 (SUTURE) IMPLANT
TOWEL GREEN STERILE (TOWEL DISPOSABLE) ×2 IMPLANT
TOWEL GREEN STERILE FF (TOWEL DISPOSABLE) ×2 IMPLANT
TRAY CATH 16FR W/PLASTIC CATH (SET/KITS/TRAYS/PACK) ×2 IMPLANT
TRAY FOLEY MTR SLVR 16FR STAT (SET/KITS/TRAYS/PACK) ×2 IMPLANT
WATER STERILE IRR 1000ML POUR (IV SOLUTION) ×2 IMPLANT

## 2019-12-05 NOTE — Interval H&P Note (Signed)
History and Physical Interval Note:  12/05/2019 12:41 PM  Sydney [name]  has presented today for surgery, with the diagnosis of right hip osteoarthritis.  The various methods of treatment have been discussed with the patient and family. After consideration of risks, benefits and other options for treatment, the patient has consented to  Procedure(s): RIGHT TOTAL HIP ARTHROPLASTY ANTERIOR APPROACH  DIRECT ANTERIOR (Right) as a surgical intervention.  The patient's history has been reviewed, patient examined, no change in status, stable for surgery.  I have reviewed the patient's chart and labs.  Questions were answered to the patient's satisfaction.     Eldred Manges

## 2019-12-05 NOTE — H&P (Signed)
TOTAL HIP ADMISSION H&P  Patient is admitted for right total hip arthroplasty.  Subjective:  Chief Complaint: right hip pain  HPI: Sydney [name], 59 y.o. female, has a history of pain and functional disability in the right hip(s) due to arthritis and patient has failed non-surgical conservative treatments for greater than 12 weeks to include NSAID's and/or analgesics, use of assistive devices and activity modification.  Onset of symptoms was gradual starting 10 years ago with gradually worsening course since that time.The patient noted no past surgery on the right hip(s).  Patient currently rates pain in the right hip at 10 out of 10 with activity. Patient has night pain, worsening of pain with activity and weight bearing, trendelenberg gait, pain that interfers with activities of daily living and pain with passive range of motion. Patient has evidence of subchondral cysts, subchondral sclerosis, periarticular osteophytes and joint space narrowing by imaging studies. This condition presents safety issues increasing the risk of falls.  There is no current active infection.  Patient Active Problem List   Diagnosis Date Noted  . Unilateral primary osteoarthritis, right hip 11/17/2019  . Status post total replacement of left hip 09/15/2019  . Back pain 07/01/2018  . DDD (degenerative disc disease), lumbosacral 07/01/2018  . Venous insufficiency of both lower extremities 11/17/2016  . 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 Morbid obesity with BMI of 40.0-44.9, adult (HCC) 07/28/2016  . Chronic pain syndrome 10/24/2015  . Dysfunction of both eustachian tubes 07/26/2014  . Calculus of left kidney 04/25/2014  . Impaired fasting glucose 04/05/2014  . History of bladder surgery 04/03/2014  . History of hysterectomy 04/03/2014  . Urge incontinence of urine 04/03/2014  . Asthma 07/28/2013  . Nontoxic uninodular goiter 07/28/2013  . Solitary pulmonary nodule 07/28/2013   . Lumbar radiculopathy 04/20/2013  . Insomnia 03/22/2013  . Essential hypertension 08/18/2011  . Tobacco use disorder 08/18/2011  . Moderate episode of recurrent major depressive disorder (HCC) 04/03/2011  . Other B-complex deficiencies 08/16/2010  . Family history of cerebrovascular accident (CVA) 04/17/2010  . Family history of malignant neoplasm of gastrointestinal tract 04/17/2010  . Family history of other cardiovascular diseases(V17.49) 04/17/2010   Past Medical History:  Diagnosis Date  . Allergy   . Anemia    history of  . Anxiety   . Arthritis   . Asthma   . Atherosclerosis   . Depression   . Family history of adverse reaction to anesthesia    pt's mother felt everything and couldn't speak during a gallbladder surgery  . GERD (gastroesophageal reflux disease)   . History of hiatal hernia   . History of kidney stones   . Hypertension   . Pneumonia    a few times  . TIA (transient ischemic attack) 2012    Past Surgical History:  Procedure Laterality Date  . ABDOMINAL HYSTERECTOMY     abdominal  . APPENDECTOMY    . bladder tack    . CARPAL TUNNEL RELEASE Bilateral   . CERVICAL SPINE SURGERY     5-6, 6-7  . CHOLECYSTECTOMY    . HAMMER TOE SURGERY Right   . JOINT REPLACEMENT    . TOTAL HIP 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   Procedure: LEFT TOTAL HIP ARTHROPLASTY -DIRECT ANTERIOR;  Surgeon: Eldred Manges, MD;  Location: [facility] OR;  Service: Orthopedics;  Laterality: Left;    Current Facility-Administered Medications  Medication Dose Route Frequency Provider Last Rate Last Admin  . bupivacaine liposome (EXPAREL) 1.3 % injection  133 mg  10 mL Infiltration Once Marybelle Killings, MD      . vancomycin (VANCOREADY) IVPB 1500 mg/300 mL  1,500 mg Intravenous On Call to OR Marybelle Killings, MD       Current Outpatient Medications  Medication Sig Dispense Refill Last Dose  . amLODipine (NORVASC) 5 MG tablet Take 1 tablet (5 mg total) by mouth daily. 90 tablet 2   . calcium carbonate  (TUMS - DOSED IN MG ELEMENTAL CALCIUM) 500 MG chewable tablet Chew 2 tablets by mouth daily as needed for indigestion or heartburn.     . fexofenadine (ALLEGRA) 180 MG tablet Take 180 mg by mouth daily.      Marland Kitchen HYDROcodone-acetaminophen (NORCO/VICODIN) 5-325 MG tablet Take 1 tablet by mouth every 6 (six) hours as needed for moderate pain. 30 tablet 0   . metoprolol succinate (TOPROL-XL) 25 MG 24 hr tablet Take 1 tablet (25 mg total) by mouth daily. 90 tablet 2   . Pediatric Multivitamins-Iron (FLINTSTONES COMPLETE PO) Take 1 tablet by mouth 3 (three) times a week.     . sertraline (ZOLOFT) 100 MG tablet Take 100 mg by mouth daily.      . traZODone (DESYREL) 100 MG tablet Take 200 mg by mouth at bedtime.      Marland Kitchen albuterol (PROAIR HFA) 108 (90 Base) MCG/ACT inhaler Inhale 2 puffs into the lungs every 6 (six) hours as needed for wheezing or shortness of breath. 18 g 2   . aspirin EC 325 MG EC tablet Take 1 tablet (325 mg total) by mouth daily with breakfast. 30 tablet 0 Not Taking at Unknown time  . methocarbamol (ROBAXIN) 500 MG tablet Take 1 tablet (500 mg total) by mouth every 6 (six) hours as needed for muscle spasms. 60 tablet 0   . oxyCODONE-acetaminophen (PERCOCET/ROXICET) 5-325 MG tablet Take 2 tablets by mouth every 6 (six) hours as needed for severe pain. 50 tablet 0    Allergies  Allergen Reactions  . Bee Pollen Anaphylaxis and Swelling  . Bee Venom Anaphylaxis and Swelling  . Butorphanol Other (See Comments)    Not sure told by md she was allergic after surgery.   . Meloxicam Anxiety    Mood disorder Altered her personality   . Penicillins Anaphylaxis and Hives    Unable to Recall As child mom was told she is highly allergic Did it involve swelling of the face/tongue/throat, SOB, or low BP? Unknown Did it involve sudden or severe rash/hives, skin peeling, or any reaction on the inside of your mouth or nose? Unknown Did you need to seek medical attention at a hospital or doctor's  office? Unknown When did it last happen?childhood allergy If all above answers are "NO", may proceed with cephalosporin use.   Marland Kitchen Prochlorperazine Edisylate Anaphylaxis  . Sulfa Antibiotics Anaphylaxis    Unable to Recall  . Tramadol Anxiety    Didn't like the way it made her feel   . Topiramate Nausea And Vomiting    Social History   Tobacco Use  . Smoking status: Current Every Day Smoker    Packs/day: 0.50    Years: 23.00    Pack years: 11.50    Types: Cigarettes  . Smokeless tobacco: Never Used  Substance Use Topics  . Alcohol use: Yes    Comment: occ    Family History  Problem Relation Age of Onset  . Anxiety disorder Mother   . Depression Mother   . Heart disease Mother   .  Hypertension Mother   . Arrhythmia Mother   . Cancer Father   . Lung cancer Father   . Migraines Sister   . Alcohol abuse Brother   . Cancer Daughter        nerve sheath sarcoma     Review of Systems  Constitutional: Positive for activity change.  HENT: Negative.   Respiratory: Negative.   Cardiovascular: Negative.   Gastrointestinal: Negative.   Genitourinary: Negative.   Musculoskeletal: Positive for gait problem.  Skin: Negative.   Psychiatric/Behavioral: Negative.     Objective:  Physical Exam  Constitutional: She is oriented to person, place, and time. No distress.  HENT:  Head: Normocephalic and atraumatic.  Eyes: Pupils are equal, round, and reactive to light. EOM are normal.  Cardiovascular: Normal heart sounds.  Respiratory: Effort normal and breath sounds normal. No respiratory distress.  GI: She exhibits no distension.  Musculoskeletal:        General: Tenderness present.     Cervical back: Normal range of motion.  Neurological: She is alert and oriented to person, place, and time.  Skin: Skin is warm and dry.  Psychiatric: She has a normal mood and affect.    Vital signs in last 24 hours:    Labs:   Estimated body mass index is 38.54 kg/m as  calculated from the following:   Height as of 11/24/19: 5\' 5"  (1.651 m).   Weight as of 12/02/19: 105.1 kg.   Imaging Review Plain radiographs demonstrate moderate degenerative joint disease of the right hip(s). The bone quality appears to be good for age and reported activity level.      Assessment/Plan:  End stage arthritis, right hip(s)  The patient history, physical examination, clinical judgement of the provider and imaging studies are consistent with end stage degenerative joint disease of the right hip(s) and total hip arthroplasty is deemed medically necessary. The treatment options including medical management, injection therapy, arthroscopy and arthroplasty were discussed at length. The risks and benefits of total hip arthroplasty were presented and reviewed. The risks due to aseptic loosening, infection, stiffness, dislocation/subluxation,  thromboembolic complications and other imponderables were discussed.  The patient acknowledged the explanation, agreed to proceed with the plan and consent was signed. Patient is being admitted for inpatient treatment for surgery, pain control, PT, OT, prophylactic antibiotics, VTE prophylaxis, progressive ambulation and ADL's and discharge planning.The patient is planning to be discharged home with home health services

## 2019-12-05 NOTE — Transfer of Care (Signed)
Immediate Anesthesia Transfer of Care Note  Patient: Sydney [name]  Procedure(s) Performed: RIGHT TOTAL HIP ARTHROPLASTY ANTERIOR APPROACH  DIRECT ANTERIOR (Right Hip)  Patient Location: PACU  Anesthesia Type:General  Level of Consciousness: awake and alert   Airway & Oxygen Therapy: Patient Spontanous Breathing and Patient connected to nasal cannula oxygen  Post-op Assessment: Report given to RN and Post -op Vital signs reviewed and stable  Post vital signs: Reviewed and stable  Last Vitals:  Vitals Value Taken Time  BP 113/51 12/05/19 1632  Temp 35.9 C 12/05/19 1635  Pulse 84 12/05/19 1636  Resp 20 12/05/19 1636  SpO2 96 % 12/05/19 1636  Vitals shown include unvalidated device data.  Last Pain:  Vitals:   12/05/19 1158  TempSrc:   PainSc: 0-No pain         Complications: No apparent anesthesia complications

## 2019-12-05 NOTE — Op Note (Signed)
Preop diagnosis right hip osteoarthritis, primary.  Postop diagnosis: Primary right hip osteoarthritis  Procedure: Right total hip arthroplasty, direct anterior approach  Surgeon: Annell Greening, MD  Assistant: Zonia Kief, PA-C medically necessary and present for the entire procedure  Anesthesia General plus Marcaine and Exparel local.  EBL: 450 mL.  Implants:Depuy Gripton 54 cup series 100. Plus 4 54X56mm neutral liner, actis size 2 stem plus 5 ceramic ball  Procedure: After attempted spinal unsuccessful multiple attempts patient was converted to general anesthesia placed on the Hana table after placement of a Foley catheter careful padding positioning Hana boots were applied before she was transferred to the table.  C-arm was brought in good visualization.  Abdomen was taped over and 1015 drapes were applied.  DuraPrep usual 1015 drapes above and below that were blue followed by large shower curtain Betadine Steri-Drape application half sheet above half across.  Timeout procedure completed.  Antibiotic prophylaxis was completed and timeout procedure.  Direct anterior approach was made starting 1 fingerbreadth lateral 1 inferior to the ASIS obliquely down to the trochanter.  Thick adipose layer was split fascia was identified cleaned off with Cobb nicked extended and then blunt retractor placed over the capsule.  Transverse bleeders were coagulated carefully.  Capsule was opened clear joint fluid was noted.  Neck was cut with C-arm visualization appropriate length ended up being pad about 54mm short for about a 7 mm neck length.  Head was removed with a corkscrew.  There was grade IV chondromalacia where with eburnated bone.  90 degree Hohmann retractor was placed just over the top of the capsule and sequential reaming up to 53 for 54 cup no hole scription cup selected impacted.  We had to use a touchup reamer couple times to look finally CV completely was very solid.  We had tried several times to  get the handle back and and the apex hole illuminator could not screw in.  Serum was used for cup flexion and abduction.  Patient was at 40 degrees of abduction and may have had 20 of cup flexion or cup anteversion.  +4 neutral liner was inserted impacted and the orange ball impactor was used to secure it cup was solid would not move.  Hydraulic arm applied hip was externally rotated to 120.  Taken down and under meter Mueller retractor medial and extensive work was done to get the trochanteric plant back behind the trochanter and mobilize hip to get it over lateral.  We progressed up to #2 stem which was exactly what she had on the other side with good fit.  +5 root release toward the neck length since neck cut was just slightly shorter than the opposite side.  There is good stability external rotation 90 degrees leg to be taken to extension 45 degrees with no subluxation there was trace shock and identical leg lengths by fluoroscopy imaging.  Permanent stem was inserted permanent ceramic ball impacted identical findings of stability rechecked.  Final look for bleeders we recoagulated the transverse vessel on each side.  Upper field was dry V lock closure of the fascia ~subtendinous tissue skin staple closure postop dressing and transferred recovery room.

## 2019-12-05 NOTE — Anesthesia Procedure Notes (Addendum)
Spinal  Patient location during procedure: OR Start time: 12/05/2019 1:30 PM End time: 12/05/2019 1:40 PM Staffing Anesthesiologist: Kipp Brood, MD Spinal Block Patient position: sitting Prep: ChloraPrep Patient monitoring: heart rate, cardiac monitor, continuous pulse ox and blood pressure Approach: midline Needle Needle type: Quincke  Needle gauge: 22 G Needle length: 9 cm Additional Notes Spinal attempted, multiple passes with 24 G pencan and and 22G tuohy, unsuccessful due to inability to  spinal canal. Decision made to proceed to general anesthesia.   Kipp Brood

## 2019-12-05 NOTE — Anesthesia Postprocedure Evaluation (Signed)
Anesthesia Post Note  Patient: Sydney [name]  Procedure(s) Performed: RIGHT TOTAL HIP ARTHROPLASTY ANTERIOR APPROACH  DIRECT ANTERIOR (Right Hip)     Patient location during evaluation: PACU Anesthesia Type: MAC Level of consciousness: awake and alert Pain management: pain level controlled Vital Signs Assessment: post-procedure vital signs reviewed and stable Respiratory status: spontaneous breathing, nonlabored ventilation, respiratory function stable and patient connected to nasal cannula oxygen Cardiovascular status: blood pressure returned to baseline and stable Postop Assessment: no apparent nausea or vomiting Anesthetic complications: no    Last Vitals:  Vitals:   12/05/19 1735 12/05/19 1750  BP: 123/74 123/69  Pulse: 68 70  Resp: 14 19  Temp:  36.4 C  SpO2: 96% 93%    Last Pain:  Vitals:   12/05/19 1740  TempSrc:   PainSc: 9         RLE Motor Response: Purposeful movement;Responds to commands (12/05/19 1750) RLE Sensation: Full sensation (12/05/19 1750)      Timothy Trudell COKER

## 2019-12-05 NOTE — Anesthesia Procedure Notes (Signed)
Procedure Name: Intubation Date/Time: 12/05/2019 2:16 PM Performed by: Scheryl Darter, CRNA Pre-anesthesia Checklist: Patient identified, Emergency Drugs available, Suction available and Patient being monitored Patient Re-evaluated:Patient Re-evaluated prior to induction Oxygen Delivery Method: Circle System Utilized Preoxygenation: Pre-oxygenation with 100% oxygen Induction Type: IV induction Ventilation: Mask ventilation without difficulty Laryngoscope Size: Mac and 4 Tube type: Oral Tube size: 7.5 mm Number of attempts: 1 Airway Equipment and Method: Stylet and Oral airway Placement Confirmation: ETT inserted through vocal cords under direct vision,  positive ETCO2 and breath sounds checked- equal and bilateral Secured at: 23 cm Tube secured with: Tape Dental Injury: Teeth and Oropharynx as per pre-operative assessment

## 2019-12-06 ENCOUNTER — Other Ambulatory Visit: Payer: Self-pay

## 2019-12-06 DIAGNOSIS — M5137 Other intervertebral disc degeneration, lumbosacral region: Secondary | ICD-10-CM | POA: Diagnosis present

## 2019-12-06 DIAGNOSIS — Z9181 History of falling: Secondary | ICD-10-CM | POA: Diagnosis not present

## 2019-12-06 DIAGNOSIS — F17210 Nicotine dependence, cigarettes, uncomplicated: Secondary | ICD-10-CM | POA: Diagnosis present

## 2019-12-06 DIAGNOSIS — M1611 Unilateral primary osteoarthritis, right hip: Secondary | ICD-10-CM | POA: Diagnosis present

## 2019-12-06 DIAGNOSIS — Z8701 Personal history of pneumonia (recurrent): Secondary | ICD-10-CM | POA: Diagnosis not present

## 2019-12-06 DIAGNOSIS — Z882 Allergy status to sulfonamides status: Secondary | ICD-10-CM | POA: Diagnosis not present

## 2019-12-06 DIAGNOSIS — Z91030 Bee allergy status: Secondary | ICD-10-CM | POA: Diagnosis not present

## 2019-12-06 DIAGNOSIS — Z87442 Personal history of urinary calculi: Secondary | ICD-10-CM | POA: Diagnosis not present

## 2019-12-06 DIAGNOSIS — M79609 Pain in unspecified limb: Secondary | ICD-10-CM | POA: Diagnosis not present

## 2019-12-06 DIAGNOSIS — J45909 Unspecified asthma, uncomplicated: Secondary | ICD-10-CM | POA: Diagnosis present

## 2019-12-06 DIAGNOSIS — K219 Gastro-esophageal reflux disease without esophagitis: Secondary | ICD-10-CM | POA: Diagnosis present

## 2019-12-06 DIAGNOSIS — M94251 Chondromalacia, right hip: Secondary | ICD-10-CM | POA: Diagnosis present

## 2019-12-06 DIAGNOSIS — Z885 Allergy status to narcotic agent status: Secondary | ICD-10-CM | POA: Diagnosis not present

## 2019-12-06 DIAGNOSIS — Z87892 Personal history of anaphylaxis: Secondary | ICD-10-CM | POA: Diagnosis not present

## 2019-12-06 DIAGNOSIS — F419 Anxiety disorder, unspecified: Secondary | ICD-10-CM | POA: Diagnosis present

## 2019-12-06 DIAGNOSIS — Z79899 Other long term (current) drug therapy: Secondary | ICD-10-CM | POA: Diagnosis not present

## 2019-12-06 DIAGNOSIS — Z8673 Personal history of transient ischemic attack (TIA), and cerebral infarction without residual deficits: Secondary | ICD-10-CM | POA: Diagnosis not present

## 2019-12-06 DIAGNOSIS — G894 Chronic pain syndrome: Secondary | ICD-10-CM | POA: Diagnosis present

## 2019-12-06 DIAGNOSIS — Z6841 Body Mass Index (BMI) 40.0 and over, adult: Secondary | ICD-10-CM | POA: Diagnosis not present

## 2019-12-06 DIAGNOSIS — I10 Essential (primary) hypertension: Secondary | ICD-10-CM | POA: Diagnosis present

## 2019-12-06 DIAGNOSIS — M7989 Other specified soft tissue disorders: Secondary | ICD-10-CM | POA: Diagnosis not present

## 2019-12-06 DIAGNOSIS — Z7982 Long term (current) use of aspirin: Secondary | ICD-10-CM | POA: Diagnosis not present

## 2019-12-06 DIAGNOSIS — Z888 Allergy status to other drugs, medicaments and biological substances status: Secondary | ICD-10-CM | POA: Diagnosis not present

## 2019-12-06 DIAGNOSIS — Z880 Allergy status to penicillin: Secondary | ICD-10-CM | POA: Diagnosis not present

## 2019-12-06 DIAGNOSIS — Z96649 Presence of unspecified artificial hip joint: Secondary | ICD-10-CM

## 2019-12-06 DIAGNOSIS — F331 Major depressive disorder, recurrent, moderate: Secondary | ICD-10-CM | POA: Diagnosis present

## 2019-12-06 LAB — BASIC METABOLIC PANEL
Anion gap: 7 (ref 5–15)
BUN: 7 mg/dL (ref 6–20)
CO2: 26 mmol/L (ref 22–32)
Calcium: 8.5 mg/dL — ABNORMAL LOW (ref 8.9–10.3)
Chloride: 104 mmol/L (ref 98–111)
Creatinine, Ser: 0.56 mg/dL (ref 0.44–1.00)
GFR calc Af Amer: >60 mL/min (ref >60)
GFR calc non Af Amer: >60 mL/min (ref >60)
Glucose, Bld: 122 mg/dL — ABNORMAL HIGH (ref 70–99)
Potassium: 4.9 mmol/L (ref 3.5–5.1)
Sodium: 137 mmol/L (ref 135–145)

## 2019-12-06 LAB — CBC
HCT: 34.8 % — ABNORMAL LOW (ref 36.0–46.0)
Hemoglobin: 10.7 g/dL — ABNORMAL LOW (ref 12.0–15.0)
MCH: 28.5 pg (ref 26.0–34.0)
MCHC: 30.7 g/dL (ref 30.0–36.0)
MCV: 92.8 fL (ref 80.0–100.0)
Platelets: 220 10*3/uL (ref 150–400)
RBC: 3.75 MIL/uL — ABNORMAL LOW (ref 3.87–5.11)
RDW: 14.5 % (ref 11.5–15.5)
WBC: 8.2 10*3/uL (ref 4.0–10.5)
nRBC: 0 % (ref 0.0–0.2)

## 2019-12-06 MED ORDER — ALBUTEROL SULFATE HFA 108 (90 BASE) MCG/ACT IN AERS
2.0000 | INHALATION_SPRAY | Freq: Four times a day (QID) | RESPIRATORY_TRACT | 0 refills | Status: DC | PRN
Start: 2019-12-06 — End: 2020-04-10

## 2019-12-06 NOTE — Progress Notes (Signed)
TED hose placed on the left leg patient stated that she did not want on the right due to spasms

## 2019-12-06 NOTE — Telephone Encounter (Signed)
Last office visit 04/15/2019  Patient has upcoming surgery and needs to have for after surgery prn

## 2019-12-06 NOTE — Progress Notes (Signed)
Patient states she sat too long on the chair when she finally got up to go to the bathroom since she had been waiting to see if therapy was coming back to work with her some more she had severe spasms in her buttocks and posterior aspect of her leg down to her knee.  She states she admitted to the bathroom urinated and now is back in bed whenever she moves she feels sharp spasm.  She did get up with assistance she has not had her afternoon therapy but appears patient would not be going home today due to mobility problems and will be changed to admission status.

## 2019-12-06 NOTE — Plan of Care (Signed)

## 2019-12-06 NOTE — Telephone Encounter (Signed)
Just sent by Dr D on 5/14.  Dis she not get it?

## 2019-12-06 NOTE — Evaluation (Signed)
Physical Therapy Evaluation Patient Details Name: Sydney [name] MRN: [id number] DOB: [birth year] Today's Date: 12/06/2019   History of Present Illness  Pt is 59 yo female admitted for R direct anterior THA after severe OA R hip and PMH: L THA 2/21, DDD, asthma, HTN, chronic pain.   Clinical Impression  Pt is s/p THA resulting in the deficits listed below (see PT Problem List). Pt required min A for mobility today, deferred ambulation due to symptomatic decrease in BP.  Pt will benefit from skilled PT to increase their independence and safety with mobility to allow discharge to the venue listed below.      Follow Up Recommendations Follow surgeon's recommendation for DC plan and follow-up therapies    Equipment Recommendations  None recommended by PT    Recommendations for Other Services       Precautions / Restrictions Precautions Precautions: None Restrictions Weight Bearing Restrictions: Yes RLE Weight Bearing: Weight bearing as tolerated      Mobility  Bed Mobility Overal bed mobility: Needs Assistance Bed Mobility: Supine to Sit     Supine to sit: Min assist     General bed mobility comments: min A to RLE  Transfers Overall transfer level: Needs assistance Equipment used: Rolling walker (2 wheeled) Transfers: Sit to/from [employer] Sit to Stand: Min assist Stand pivot transfers: Min assist       General transfer comment: pt took increased time to problem solve through task since it was the opposite of what she was doing in 2/21 with L hip. Min A for power up and to steady. Min A to steady with pivot to [facility] and then to recliner. Pt with dizziness during second tranfer  Ambulation/Gait             General Gait Details: deferred due to symptomatic decr in BP  Stairs            Wheelchair Mobility    Modified Rankin (Stroke Patients Only)       Balance Overall balance assessment: Mild deficits observed, not formally tested                                            Pertinent Vitals/Pain Pain Assessment: 0-10 Pain Score: 7  Pain Location: R hip Pain Descriptors / Indicators: Burning;Sore Pain Intervention(s): Limited activity within patient's tolerance;Monitored during session;Premedicated before session    Home Living Family/patient expects to be discharged to:: Private residence Living Arrangements: Spouse/significant other;Children;Other relatives Available Help at Discharge: Family;Available 24 hours/day Type of Home: House Home Access: Stairs to enter   [employer] of Steps: 3 Home Layout: One level Home Equipment: Walker - 2 wheels;Cane - single point;Bedside commode      Prior Function Level of Independence: Independent         Comments: pt has returned to independence since L THA in Feb     Hand Dominance   Dominant Hand: Right    Extremity/Trunk Assessment   Upper Extremity Assessment Upper Extremity Assessment: Defer to OT evaluation    Lower Extremity Assessment Lower Extremity Assessment: RLE deficits/detail RLE Deficits / Details: hip flex 2-/5, hip abd 2/5, knee >3/5, ankle WFL RLE Sensation: decreased light touch RLE Coordination: decreased gross motor    Cervical / Trunk Assessment Cervical / Trunk Assessment: Normal  Communication   Communication: No difficulties  Cognition Arousal/Alertness: Awake/alert Behavior During Therapy:  WFL for tasks assessed/performed Overall Cognitive Status: Within Functional Limits for tasks assessed                                        General Comments      Exercises Total Joint Exercises Ankle Circles/Pumps: AROM;Both;15 reps;Supine Quad Sets: AROM;Both;10 reps;Supine Gluteal Sets: AROM;Both;10 reps;Supine   Assessment/Plan    PT Assessment Patient needs continued PT services  PT Problem List Decreased strength;Decreased activity tolerance;Decreased balance;Decreased  mobility;Decreased coordination;Pain       PT Treatment Interventions DME instruction;Gait training;Stair training;Functional mobility training;Therapeutic activities;Therapeutic exercise;Balance training;Patient/family education    PT Goals (Current goals can be found in the Care Plan section)  Acute Rehab PT Goals Patient Stated Goal: return home PT Goal Formulation: With patient Time For Goal Achievement: 12/20/19 Potential to Achieve Goals: Good    Frequency 7X/week   Barriers to discharge        Co-evaluation               AM-PAC PT "6 Clicks" Mobility  Outcome Measure Help needed turning from your back to your side while in a flat bed without using bedrails?: A Little Help needed moving from lying on your back to sitting on the side of a flat bed without using bedrails?: A Little Help needed moving to and from a bed to a chair (including a wheelchair)?: A Little Help needed standing up from a chair using your arms (e.g., wheelchair or bedside chair)?: A Little Help needed to walk in hospital room?: A Lot Help needed climbing 3-5 steps with a railing? : A Lot 6 Click Score: 16    End of Session Equipment Utilized During Treatment: Gait belt Activity Tolerance: Treatment limited secondary to medical complications (Comment)(dizziness) Patient left: in chair;with call bell/phone within reach;with chair alarm set Nurse Communication: Mobility status PT Visit Diagnosis: Unsteadiness on feet (R26.81);Pain;Difficulty in walking, not elsewhere classified (R26.2) Pain - Right/Left: Right Pain - part of body: Hip    Time: 2130-8657 PT Time Calculation (min) (ACUTE ONLY): 30 min   Charges:   PT Evaluation $PT Eval Low Complexity: 1 Low PT Treatments $Gait Training: 8-22 mins        Leighton Roach, [place]  Pager [phone] Office [place] 12/06/2019, 11:01 AM

## 2019-12-06 NOTE — Progress Notes (Addendum)
   Subjective: 1 Day Post-Op Procedure(s) (LRB): RIGHT TOTAL HIP ARTHROPLASTY ANTERIOR APPROACH  DIRECT ANTERIOR (Right) Patient reports pain as mild and moderate.  "Pain is less than my other hip" she reports.   Objective: Vital signs in last 24 hours: Temp:  [96.7 F (35.9 C)-99 F (37.2 C)] 98.7 F (37.1 C) (05/18 0739) Pulse Rate:  [57-85] 70 (05/18 0739) Resp:  [14-20] 17 (05/18 0739) BP: (105-137)/(51-81) 105/56 (05/18 0739) SpO2:  [92 %-99 %] 92 % (05/18 0739) Weight:  [104.8 kg] 104.8 kg (05/17 1038)  Intake/Output from previous day: 05/17 0701 - 05/18 0700 In: 1982.3 [I.V.:1707.3; IV Piggyback:275] Out: 1525 [Urine:1175; Blood:350] Intake/Output this shift: No intake/output data recorded.  Recent Labs    12/06/19 0607  HGB 10.7*   Recent Labs    12/06/19 0607  WBC 8.2  RBC 3.75*  HCT 34.8*  PLT 220   Recent Labs    12/06/19 0607  NA 137  K 4.9  CL 104  CO2 26  BUN 7  CREATININE 0.56  GLUCOSE 122*  CALCIUM 8.5*   No results for input(s): LABPT, INR in the last 72 hours.  Neurologically intact DG C-Arm 1-60 Min  Result Date: 12/05/2019 CLINICAL DATA:  Right hip degenerative arthropathy, status post hip replacement EXAM: DG C-ARM 1-60 MIN COMPARISON:  None. FINDINGS: Spot fluoroscopic views during the procedure demonstrate new total right hip arthroplasty. Components appear aligned in the frontal plane. No complicating feature. IMPRESSION: Status post right hip arthroplasty and remote left hip arthroplasty. No complicating features. Electronically Signed   By: Judie Petit.  Shick M.D.   On: 12/05/2019 16:15   DG Hip Port Unilat With Pelvis 1V Right  Result Date: 12/05/2019 CLINICAL DATA:  59 year old female status post right hip arthroplasty. EXAM: DG HIP (WITH OR WITHOUT PELVIS) 1V PORT RIGHT COMPARISON:  Intraoperative images 14 18 hours today. Right hip series 08/31/2019. FINDINGS: Portable AP and cross-table lateral views of the pelvis and right hip at 1655  hours. Bilateral hip arthroplasty now, bipolar hardware on the right with normal AP alignment. The cross-table lateral is under penetrated. No unexpected osseous changes. Chronic small surgical clips near the pelvic floor/pubic symphysis. Overlying skin staples on the right. IMPRESSION: Under penetrated cross-table lateral view but no adverse features identified status post right hip arthroplasty. Electronically Signed   By: Odessa Fleming M.D.   On: 12/05/2019 17:24   DG HIP OPERATIVE UNILAT W OR W/O PELVIS RIGHT  Result Date: 12/05/2019 CLINICAL DATA:  RIGHT hip arthroplasty anterior approach EXAM: OPERATIVE RIGHT HIP (WITH PELVIS IF PERFORMED) 3 VIEWS TECHNIQUE: Fluoroscopic spot image(s) were submitted for interpretation post-operatively. COMPARISON:  Pelvic radiograph 08/31/2019 FLUOROSCOPY TIME:  0 minutes 40 seconds Dose: 6.88 mGy FINDINGS: Osseous demineralization. Indwelling LEFT hip prosthesis. Osteoarthritic changes RIGHT hip joint with joint space narrowing and spur formation. Placement of RIGHT hip prosthesis without acute fracture or dislocation. IMPRESSION: Osteoarthritis RIGHT hip and osseous demineralization. RIGHT hip prosthesis without acute complication. Electronically Signed   By: Ulyses Southward M.D.   On: 12/05/2019 16:15    Assessment/Plan: 1 Day Post-Op Procedure(s) (LRB): RIGHT TOTAL HIP ARTHROPLASTY ANTERIOR APPROACH  DIRECT ANTERIOR (Right) Up with therapy, encourage PO fluids, possible home this afternoon.   Eldred Manges 12/06/2019, 8:02 AM

## 2019-12-06 NOTE — Plan of Care (Signed)

## 2019-12-06 NOTE — Progress Notes (Signed)
Physical Therapy Treatment Patient Details Name: Sydney [name] MRN: [id number] DOB: February 14, 1961 Today's Date: 12/06/2019    History of Present Illness Pt is 59 yo female admitted for R direct anterior THA after severe OA R hip and PMH: L THA 2/21, DDD, asthma, HTN, chronic pain.     PT Comments    Pt received in bed and relayed that when she got up from recliner with nursing that she began having spasming of RLE from hip down lateral LE to ankle. Worked on supine contract-relax exercises with 25% effort to try to restore normal motion and promote relaxation of RLE. Pt also repositioned in bed which helped a bit. Did not mobilize pt given current level of discomfort compared to how she felt this AM. RN aware. PT will continue to follow.    Follow Up Recommendations  Follow surgeon's recommendation for DC plan and follow-up therapies     Equipment Recommendations  None recommended by PT    Recommendations for Other Services       Precautions / Restrictions Precautions Precautions: Fall Restrictions Weight Bearing Restrictions: Yes RLE Weight Bearing: Weight bearing as tolerated    Mobility  Bed Mobility               General bed mobility comments: repositioned pt in bed as was not in alignment. Removed foam from under RLE and used elevation of feet of bed to elevate LE. Attempting to increase security of RLE to help with relaxation  Transfers                 General transfer comment: deferred due to pain  Ambulation/Gait             General Gait Details: deferred due to pain   Stairs             Wheelchair Mobility    Modified Rankin (Stroke Patients Only)       Balance                                            Cognition Arousal/Alertness: Awake/alert Behavior During Therapy: WFL for tasks assessed/performed Overall Cognitive Status: Within Functional Limits for tasks assessed                                         Exercises Total Joint Exercises Ankle Circles/Pumps: AROM;Both;15 reps;Supine Quad Sets: AROM;Both;10 reps;Supine Gluteal Sets: AROM;Both;10 reps;Supine Other Exercises Other Exercises: pt could not tolerate abd/add so performed AAROM IR and ER RLE 10x    General Comments        Pertinent Vitals/Pain Pain Assessment: 0-10 Pain Score: 10-Worst pain ever Pain Location: R hip down lateral LE to ankle Pain Descriptors / Indicators: Cramping Pain Intervention(s): Limited activity within patient's tolerance;Monitored during session;Premedicated before session;Repositioned;Other (comment)(warm blankets after session)    Home Living                      Prior Function            PT Goals (current goals can now be found in the care plan section) Acute Rehab PT Goals Patient Stated Goal: return home PT Goal Formulation: With patient Time For Goal Achievement: 12/20/19 Potential to Achieve Goals: Good Progress towards PT goals: Not progressing  toward goals - comment(muscle spasms)    Frequency    7X/week      PT Plan Current plan remains appropriate    Co-evaluation              AM-PAC PT "6 Clicks" Mobility   Outcome Measure  Help needed turning from your back to your side while in a flat bed without using bedrails?: A Lot Help needed moving from lying on your back to sitting on the side of a flat bed without using bedrails?: A Lot Help needed moving to and from a bed to a chair (including a wheelchair)?: A Lot Help needed standing up from a chair using your arms (e.g., wheelchair or bedside chair)?: A Lot Help needed to walk in hospital room?: A Lot Help needed climbing 3-5 steps with a railing? : A Lot 6 Click Score: 12    End of Session   Activity Tolerance: Other (comment)(limited secondary to RLE muscle spasms) Patient left: in bed;with call bell/phone within reach Nurse Communication: Mobility status PT Visit Diagnosis:  Unsteadiness on feet (R26.81);Pain;Difficulty in walking, not elsewhere classified (R26.2) Pain - Right/Left: Right Pain - part of body: Hip     Time: 1451-1511 PT Time Calculation (min) (ACUTE ONLY): 20 min  Charges:  $Therapeutic Exercise: 8-22 mins                     Leighton Roach, PT  Acute Rehab Services  Pager [phone] Office [place] 12/06/2019, 3:48 PM

## 2019-12-06 NOTE — Plan of Care (Signed)
  Problem: Clinical Measurements: Goal: Respiratory complications will improve Outcome: Progressing   Problem: Clinical Measurements: Goal: Ability to maintain clinical measurements within normal limits will improve Outcome: Progressing Goal: Will remain free from infection Outcome: Progressing Goal: Diagnostic test results will improve Outcome: Progressing Goal: Respiratory complications will improve Outcome: Progressing Goal: Cardiovascular complication will be avoided Outcome: Progressing   Problem: Activity: Goal: Risk for activity intolerance will decrease Outcome: Progressing   Problem: Nutrition: Goal: Adequate nutrition will be maintained Outcome: Progressing   Problem: Coping: Goal: Level of anxiety will decrease Outcome: Progressing   Problem: Elimination: Goal: Will not experience complications related to bowel motility Outcome: Progressing Goal: Will not experience complications related to urinary retention Outcome: Progressing   Problem: Elimination: Goal: Will not experience complications related to urinary retention Outcome: Progressing   Problem: Pain Managment: Goal: General experience of comfort will improve Outcome: Progressing   Problem: Safety: Goal: Ability to remain free from injury will improve Outcome: Progressing

## 2019-12-07 ENCOUNTER — Encounter: Payer: Self-pay | Admitting: *Deleted

## 2019-12-07 ENCOUNTER — Inpatient Hospital Stay (HOSPITAL_COMMUNITY): Payer: No Typology Code available for payment source

## 2019-12-07 DIAGNOSIS — M7989 Other specified soft tissue disorders: Secondary | ICD-10-CM

## 2019-12-07 DIAGNOSIS — M79609 Pain in unspecified limb: Secondary | ICD-10-CM

## 2019-12-07 LAB — CBC
HCT: 31 % — ABNORMAL LOW (ref 36.0–46.0)
Hemoglobin: 9.7 g/dL — ABNORMAL LOW (ref 12.0–15.0)
MCH: 29.2 pg (ref 26.0–34.0)
MCHC: 31.3 g/dL (ref 30.0–36.0)
MCV: 93.4 fL (ref 80.0–100.0)
Platelets: 185 10*3/uL (ref 150–400)
RBC: 3.32 MIL/uL — ABNORMAL LOW (ref 3.87–5.11)
RDW: 14.8 % (ref 11.5–15.5)
WBC: 6.5 10*3/uL (ref 4.0–10.5)
nRBC: 0 % (ref 0.0–0.2)

## 2019-12-07 MED ORDER — METHOCARBAMOL 500 MG PO TABS: 500.0000 mg | ORAL_TABLET | Freq: Four times a day (QID) | ORAL | 0 refills | Status: DC | PRN

## 2019-12-07 MED ORDER — ASPIRIN EC 325 MG PO TBEC
325.0000 mg | DELAYED_RELEASE_TABLET | Freq: Every day | ORAL | 0 refills | Status: DC
Start: 2019-12-07 — End: 2020-02-14

## 2019-12-07 MED ORDER — OXYCODONE-ACETAMINOPHEN 7.5-325 MG PO TABS: 1.0000 | ORAL_TABLET | ORAL | 0 refills | Status: DC | PRN

## 2019-12-07 NOTE — Progress Notes (Signed)
PT Cancellation Note  Patient Details Name: Sydney [name] MRN: [id number] DOB: 10-Mar-1961   Cancelled Treatment:     Awaiting Doppler to rule out DVT.    Lillia Pauls, PT, DPT Acute Rehabilitation Services Pager [phone] Office [phone]    [name] 12/07/2019, 4:29 PM

## 2019-12-07 NOTE — Progress Notes (Signed)
Right lower extremity venous duplex has been completed. Preliminary results can be found in CV Proc through chart review.  Results were given to the patient's nurse, Neva.  12/07/19 4:45 PM Olen Cordial RVT

## 2019-12-07 NOTE — Progress Notes (Signed)
Physical Therapy Treatment Patient Details Name: Sydney [name] MRN: [id number] DOB: 06-28-1961 Today's Date: 12/07/2019    History of Present Illness Pt is 59 yo female admitted for R direct anterior THA after severe OA R hip and PMH: L THA 2/21, DDD, asthma, HTN, chronic pain.     PT Comments    Pt regressing with her physical therapy goals, more limited by pain and gross right hip and quad weakness this session. Requiring increased assist for transitions to standing, taking ~5 minutes to ambulate a period of 3 feet before fatiguing and needing to sit down. She is unable to weight shift to clear her foot and required manual assist by PT to progress RLE anteriorly. Will need a wheelchair for mobility for home and updated CM Angela. Instructed pt on HEP for further strengthening.     Follow Up Recommendations  Home health PT; Supervision for mobility/OOB     Equipment Recommendations  Wheelchair (measurements PT);Wheelchair cushion (measurements PT)    Recommendations for Other Services       Precautions / Restrictions Precautions Precautions: Fall Restrictions Weight Bearing Restrictions: Yes RLE Weight Bearing: Weight bearing as tolerated    Mobility  Bed Mobility Overal bed mobility: Needs Assistance Bed Mobility: Supine to Sit     Supine to sit: Min assist     General bed mobility comments: MinA for RLE management  Transfers Overall transfer level: Needs assistance Equipment used: Rolling walker (2 wheeled) Transfers: Sit to/from Stand Sit to Stand: Mod assist         General transfer comment: ModA to boost from edge of bed and [facility]  Ambulation/Gait Ambulation/Gait assistance: Min assist Gait Distance (Feet): 3 Feet Assistive device: Rolling walker (2 wheeled) Gait Pattern/deviations: Step-to pattern;Decreased weight shift to right;Decreased stance time - right;Antalgic;Trunk flexed Gait velocity: decreased Gait velocity interpretation: <1.31 ft/sec,  indicative of household ambulator General Gait Details: Cues for sequencing, walker proximity, weight shifting. Pt ultimately unable to clear right foot so required manual assist to progress anteriorly. Fatigues easily and required BSC to be brought behind her to sit and then subsequently stood from [facility] and recliner placed behind her to sit.    Stairs             Wheelchair Mobility    Modified Rankin (Stroke Patients Only)       Balance Overall balance assessment: Needs assistance Sitting-balance support: Feet supported Sitting balance-Leahy Scale: Good     Standing balance support: Bilateral upper extremity supported Standing balance-Leahy Scale: Poor                              Cognition Arousal/Alertness: Awake/alert Behavior During Therapy: WFL for tasks assessed/performed Overall Cognitive Status: Within Functional Limits for tasks assessed                                        Exercises Total Joint Exercises Quad Sets: AROM;Both;Supine;15 reps Hip ABduction/ADduction: Both;10 reps;Seated(isometric) Long Arc Quad: Right;10 reps;Seated General Exercises - Lower Extremity Hip Flexion/Marching: Right;5 reps;Seated    General Comments        Pertinent Vitals/Pain Pain Assessment: Faces Faces Pain Scale: Hurts whole lot Pain Location: R anterior thigh Pain Descriptors / Indicators: Operative site guarding;Grimacing Pain Intervention(s): Limited activity within patient's tolerance;Monitored during session;Repositioned;Ice applied    Home Living  Prior Function            PT Goals (current goals can now be found in the care plan section) Acute Rehab PT Goals Patient Stated Goal: return home Potential to Achieve Goals: Good Progress towards PT goals: Not progressing toward goals - comment(pain)    Frequency    7X/week      PT Plan Current plan remains appropriate    Co-evaluation               AM-PAC PT "6 Clicks" Mobility   Outcome Measure  Help needed turning from your back to your side while in a flat bed without using bedrails?: A Little Help needed moving from lying on your back to sitting on the side of a flat bed without using bedrails?: A Little Help needed moving to and from a bed to a chair (including a wheelchair)?: A Little Help needed standing up from a chair using your arms (e.g., wheelchair or bedside chair)?: A Lot Help needed to walk in hospital room?: A Lot Help needed climbing 3-5 steps with a railing? : Total 6 Click Score: 14    End of Session Equipment Utilized During Treatment: Gait belt Activity Tolerance: Patient limited by pain;Patient limited by fatigue Patient left: in chair;with call bell/phone within reach;with chair alarm set   PT Visit Diagnosis: Unsteadiness on feet (R26.81);Pain;Difficulty in walking, not elsewhere classified (R26.2) Pain - Right/Left: Right Pain - part of body: Hip     Time: 9798-9211 PT Time Calculation (min) (ACUTE ONLY): 42 min  Charges:  $Gait Training: 8-22 mins $Therapeutic Exercise: 8-22 mins $Therapeutic Activity: 8-22 mins                       Wyona Almas, PT, DPT Acute Rehabilitation Services Pager [phone] Office [phone]    Deno Etienne 12/07/2019, 2:33 PM

## 2019-12-07 NOTE — Plan of Care (Signed)
  Problem: Pain Managment: Goal: General experience of comfort will improve Outcome: Progressing   Problem: Safety: Goal: Ability to remain free from injury will improve Outcome: Progressing   

## 2019-12-07 NOTE — Progress Notes (Signed)
PT TREATMENT  Pt able to progress to ambulating 15 feet to bathroom with a walker and min assist. However, after sitting on the toilet for ~10-15 mins, she reports her "legs were asleep," and she was unable to stand. PT could not get her up with use of gait belt, so ultimately utilized lift equipment [facility]) to help her stand and get back to bed. Will continue efforts.    Lillia Pauls, PT, DPT Acute Rehabilitation Services Pager [phone] Office [phone]   12/07/19 1743  PT Visit Information  Last PT Received On 12/07/19  Assistance Needed +1  History of Present Illness Pt is 59 yo female admitted for R direct anterior THA after severe OA R hip and PMH: L THA 2/21, DDD, asthma, HTN, chronic pain.   Subjective Data  Patient Stated Goal return home  Precautions  Precautions Fall  Restrictions  Weight Bearing Restrictions Yes  RLE Weight Bearing WBAT  Pain Assessment  Pain Assessment Faces  Faces Pain Scale 6  Pain Location R anterior thigh  Pain Descriptors / Indicators Operative site guarding;Grimacing  Pain Intervention(s) Monitored during session  Cognition  Arousal/Alertness Awake/alert  Behavior During Therapy WFL for tasks assessed/performed  Overall Cognitive Status Within Functional Limits for tasks assessed  Bed Mobility  Overal bed mobility Needs Assistance  Bed Mobility Supine to Sit;Sit to Supine  Supine to sit Min assist  Sit to supine Mod assist  General bed mobility comments MinA for RLE management out of bed, modA for BLE management back into bed  Transfers  Overall transfer level Needs assistance  Equipment used Rolling walker (2 wheeled)  Transfers Sit to/from Stand  Sit to Stand Min assist;Max assist  General transfer comment MinA to boost from elevated surface on bed. After period of toilet, pt reporting BLE's "asleep," and unable to stand with use of gait belt and pulling on grab bar. Utilized Education officer, environmental by therapist to ultimately  stand to [employer]  Ambulation/Gait  Ambulation/Gait assistance Min assist  [employer] (Feet) 10 Feet  Assistive device Rolling walker (2 wheeled)  Gait Pattern/deviations Step-to pattern;Decreased weight shift to right;Decreased stance time - right;Antalgic;Trunk flexed  General Gait Details Cues for walker proximity, sequencing. MinA for stability. Able to clear right foot but still with decreased clearance  Gait velocity decreased  Gait velocity interpretation <1.31 ft/sec, indicative of household ambulator  Balance  Overall balance assessment Needs assistance  Sitting-balance support Feet supported  Sitting balance-Leahy Scale Good  Standing balance support Bilateral upper extremity supported  Standing balance-Leahy Scale Poor  Exercises  Exercises Total Joint;General Lower Extremity  Total Joint Exercises  Short Arc Quad Right;10 reps;Supine  Heel Slides Right;10 reps;Supine  PT - End of Session  Equipment Utilized During Treatment Gait belt  Activity Tolerance Patient limited by pain;Patient limited by fatigue  Patient left with call bell/phone within reach;in bed;with bed alarm set  Nurse Communication Mobility status   PT - Assessment/Plan  PT Plan Current plan remains appropriate  PT Visit Diagnosis Unsteadiness on feet (R26.81);Pain;Difficulty in walking, not elsewhere classified (R26.2)  Pain - Right/Left Right  Pain - part of body Hip  PT Frequency (ACUTE ONLY) 7X/week  Follow Up Recommendations Home health PT;Supervision for mobility/OOB  PT equipment Wheelchair (measurements PT);Wheelchair cushion (measurements PT)  AM-PAC PT "6 Clicks" Mobility Outcome Measure (Version 2)  Help needed turning from your back to your side while in a flat bed without using bedrails? 3  Help needed moving from lying on your back  to sitting on the side of a flat bed without using bedrails? 3  Help needed moving to and from a bed to a chair (including a wheelchair)? 3  Help needed  standing up from a chair using your arms (e.g., wheelchair or bedside chair)? 2  Help needed to walk in hospital room? 2  Help needed climbing 3-5 steps with a railing?  1  6 Click Score 14  Consider Recommendation of Discharge To: CIR/SNF/LTACH  PT Goal Progression  Progress towards PT goals Progressing toward goals  Acute Rehab PT Goals  Potential to Achieve Goals Good  PT Time Calculation  PT Start Time (ACUTE ONLY) 1701  PT Stop Time (ACUTE ONLY) 1737  PT Time Calculation (min) (ACUTE ONLY) 36 min  PT General Charges  $$ ACUTE PT VISIT 1 Visit  PT Treatments  $Gait Training 8-22 mins  $Therapeutic Activity 8-22 mins

## 2019-12-07 NOTE — Progress Notes (Signed)
    Durable Medical Equipment  (From admission, onward)         Start     Ordered   12/07/19 1444  For home use only DME standard manual wheelchair with seat cushion  Once    Comments: Patient suffers from r THA   which impairs their ability to perform daily activities like walking in the home.  A rolling walker will not resolve issue with performing activities of daily living. A wheelchair will allow patient to safely perform daily activities. Patient can safely propel the wheelchair in the home or has a caregiver who can provide assistance. Length of need 12 months. Accessories: elevating leg rests (ELRs), wheel locks, extensions and anti-tippers.   12/07/19 1445

## 2019-12-07 NOTE — Progress Notes (Signed)
Attempted to reach Lestine Mount PA-C twice with pt's ultrasound results.

## 2019-12-07 NOTE — Progress Notes (Signed)
Subjective: Patient seen this afternoon.  She has not ambulated outside of her room.  She is complaining of increased right leg pain from the thigh down towards her calf.  No complaints of chest pain shortness of breath.   Objective: Vital signs in last 24 hours: Temp:  [98 F (36.7 C)-98.9 F (37.2 C)] 98.6 F (37 C) (05/19 1448) Pulse Rate:  [62-78] 76 (05/19 1448) Resp:  [14-18] 16 (05/19 1448) BP: (93-118)/(38-64) 111/64 (05/19 1448) SpO2:  [93 %-95 %] 95 % (05/19 1448)  Intake/Output from previous day: 05/18 0701 - 05/19 0700 In: 2645.8 [P.O.:480; I.V.:2165.8] Out: -  Intake/Output this shift: Total I/O In: 240 [P.O.:240] Out: -   Recent Labs    12/06/19 0607 12/07/19 0314  HGB 10.7* 9.7*   Recent Labs    12/06/19 0607 12/07/19 0314  WBC 8.2 6.5  RBC 3.75* 3.32*  HCT 34.8* 31.0*  PLT 220 185   Recent Labs    12/06/19 0607  NA 137  K 4.9  CL 104  CO2 26  BUN 7  CREATININE 0.56  GLUCOSE 122*  CALCIUM 8.5*   No results for input(s): LABPT, INR in the last 72 hours.  Exam Pleasant female alert and oriented in no acute distress.  Right hip wound looks good.  No drainage or signs of infection.  She does have some swelling in her right thigh and also right calf.  Thigh and calf are moderate to markedly tender.  Neurologically intact.    Assessment/Plan: We will not plan to discharge patient home today.  Will order stat venous Doppler to rule out right lower extremity DVT.     Zonia Kief 12/07/2019, 3:12 PM

## 2019-12-08 NOTE — Progress Notes (Signed)
Physical Therapy Treatment Patient Details Name: Sydney [name] MRN: [id number] DOB: October 15, 1960 Today's Date: 12/08/2019    History of Present Illness Pt is 59 yo female admitted for R direct anterior THA after severe OA R hip and PMH: L THA 2/21, DDD, asthma, HTN, chronic pain.     PT Comments    Second session focused on stair training. After visual demonstration and instruction, pt able to negotiate 3 steps sideways with right railing. Min cues for technique. Overall, requiring min guard for safety. Recommended supervision/assist for stair negotiation upon discharge home. See below for other recommendations.     Follow Up Recommendations  Home health PT;Supervision for mobility/OOB     Equipment Recommendations  Wheelchair (measurements PT);Wheelchair cushion (measurements PT)    Recommendations for Other Services       Precautions / Restrictions Precautions Precautions: Fall Restrictions Weight Bearing Restrictions: Yes RLE Weight Bearing: Weight bearing as tolerated    Mobility  Bed Mobility Overal bed mobility: Needs Assistance Bed Mobility: Supine to Sit     Supine to sit: Min guard     General bed mobility comments: Able to progress from supine to sitting with significantly increased time/effort. Pt utilizing sock to pull RLE over edge   Transfers Overall transfer level: Needs assistance Equipment used: Rolling walker (2 wheeled) Transfers: Sit to/from Stand Sit to Stand: Min guard         General transfer comment: Increased time/effort to stand from edge of bed and wheelchair, close min guard.   Ambulation/Gait Ambulation/Gait assistance: Min guard Gait Distance (Feet): 5 Feet Assistive device: Rolling walker (2 wheeled) Gait Pattern/deviations: Step-to pattern;Decreased weight shift to right;Decreased stance time - right;Antalgic;Trunk flexed;Step-through pattern Gait velocity: decreased Gait velocity interpretation: <1.31 ft/sec, indicative of  household ambulator General Gait Details: Pivotal steps from bed to wheelchair and wheelchair <> steps. Distance not a focus this session. min guard for safety.   Stairs Stairs: Yes Stairs assistance: Min guard Stair Management: One rail Right;Sideways Number of Stairs: 3 General stair comments: Visual demonstration and instructions for sequencing, foot/hand placement   Wheelchair Mobility    Modified Rankin (Stroke Patients Only)       Balance Overall balance assessment: Needs assistance Sitting-balance support: Feet supported Sitting balance-Leahy Scale: Good     Standing balance support: Bilateral upper extremity supported Standing balance-Leahy Scale: Poor                              Cognition Arousal/Alertness: Awake/alert Behavior During Therapy: WFL for tasks assessed/performed Overall Cognitive Status: Within Functional Limits for tasks assessed                                        Exercises      General Comments        Pertinent Vitals/Pain Pain Assessment: Faces Faces Pain Scale: Hurts even more Pain Location: R hip Pain Descriptors / Indicators: Operative site guarding;Grimacing Pain Intervention(s): Monitored during session    Home Living                      Prior Function            PT Goals (current goals can now be found in the care plan section) Acute Rehab PT Goals Patient Stated Goal: return home, "be more mobile." Potential to Achieve Goals: Good  Progress towards PT goals: Progressing toward goals    Frequency    7X/week      PT Plan Current plan remains appropriate    Co-evaluation              AM-PAC PT "6 Clicks" Mobility   Outcome Measure  Help needed turning from your back to your side while in a flat bed without using bedrails?: None Help needed moving from lying on your back to sitting on the side of a flat bed without using bedrails?: A Little Help needed moving to  and from a bed to a chair (including a wheelchair)?: A Little Help needed standing up from a chair using your arms (e.g., wheelchair or bedside chair)?: A Little Help needed to walk in hospital room?: A Little Help needed climbing 3-5 steps with a railing? : A Lot 6 Click Score: 18    End of Session Equipment Utilized During Treatment: Gait belt Activity Tolerance: Patient tolerated treatment well Patient left: in bed;with call bell/phone within reach Nurse Communication: Mobility status PT Visit Diagnosis: Unsteadiness on feet (R26.81);Pain;Difficulty in walking, not elsewhere classified (R26.2) Pain - Right/Left: Right Pain - part of body: Hip     Time: 2025-4270 PT Time Calculation (min) (ACUTE ONLY): 23 min  Charges:  $Gait Training: 8-22 mins $Therapeutic Activity: 8-22 mins                       Wyona Almas, PT, DPT Acute Rehabilitation Services Pager [phone] Office [phone]    Deno Etienne 12/08/2019, 4:58 PM

## 2019-12-08 NOTE — Plan of Care (Signed)

## 2019-12-08 NOTE — Progress Notes (Signed)
Discharge paperwork and instructions given to pt. Pt not in distress and tolerated well. 

## 2019-12-08 NOTE — Discharge Instructions (Signed)

## 2019-12-08 NOTE — TOC Transition Note (Addendum)
Transition of Care [facility]) - CM/SW Discharge Note   Patient Details  Name: Sydney [name] MRN: [id number] Date of Birth: [birth year]  [facility] CM/SW Contact:  Epifanio Lesches, RN Phone Number: [phone] 12/08/2019, 8:26 AM   Clinical Narrative:    Presents for THA after severe OA R hip. PMH: L THA 2/21, DDD, asthma, HTN, chronic pain.  - s/p RIGHT TOTAL HIP ARTHROPLASTY ANTERIOR, 5/17  Plan: Pt will transition to home today. Resides with husband and son. Pt states supervision and assistance will be provided by husband and son once d/c.  Pt declined home health services. States doesn't need, states family assisted with THA,08/2019. DME ( wheelchair ) will be delivered to bedside prior to discharge. Husband to prove transportation to home. Pt without Rx med concerns or affordability.   Final next level of care: Home/Self Care Barriers to Discharge: No Barriers Identified   Patient Goals and CMS Choice     Choice offered to / list presented to : Patient  Discharge Placement                       Discharge Plan and Services                DME Arranged: Wheelchair manual DME Agency: [employer] Date DME Agency Contacted: 12/07/19 Time DME Agency Contacted: 1500 Representative spoke with at DME Agency: Ian Malkin            Social Determinants of Health (SDOH) Interventions     Readmission Risk Interventions No flowsheet data found.

## 2019-12-08 NOTE — Progress Notes (Signed)
Subjective: 3 Days Post-Op Procedure(s) (LRB): RIGHT TOTAL HIP ARTHROPLASTY ANTERIOR APPROACH  DIRECT ANTERIOR (Right) Patient reports pain as mild and moderate.    Objective: Vital signs in last 24 hours: Temp:  [98.2 F (36.8 C)-99.2 F (37.3 C)] 98.2 F (36.8 C) (05/20 0741) Pulse Rate:  [71-90] 71 (05/20 0741) Resp:  [14-17] 14 (05/20 0741) BP: (111-127)/(56-70) 126/61 (05/20 0741) SpO2:  [92 %-96 %] 93 % (05/20 0741)  Intake/Output from previous day: 05/19 0701 - 05/20 0700 In: 480 [P.O.:480] Out: -  Intake/Output this shift: No intake/output data recorded.  Recent Labs    12/06/19 0607 12/07/19 0314  HGB 10.7* 9.7*   Recent Labs    12/06/19 0607 12/07/19 0314  WBC 8.2 6.5  RBC 3.75* 3.32*  HCT 34.8* 31.0*  PLT 220 185   Recent Labs    12/06/19 0607  NA 137  K 4.9  CL 104  CO2 26  BUN 7  CREATININE 0.56  GLUCOSE 122*  CALCIUM 8.5*   No results for input(s): LABPT, INR in the last 72 hours.  Neurologically intact VAS [place] LOWER EXTREMITY VENOUS (DVT)  Result Date: 12/07/2019  Lower Venous DVTStudy Indications: Pain, and Swelling.  Limitations: Body habitus, poor ultrasound/tissue interface and patient pain tolerance, patient positioning. Comparison Study: No prior studies. Performing Technologist: Chanda Busing RVT  Examination Guidelines: A complete evaluation includes B-mode imaging, spectral Doppler, color Doppler, and power Doppler as needed of all accessible portions of each vessel. Bilateral testing is considered an integral part of a complete examination. Limited examinations for reoccurring indications may be performed as noted. The reflux portion of the exam is performed with the patient in reverse Trendelenburg.  +---------+---------------+---------+-----------+----------+--------------+ RIGHT    CompressibilityPhasicitySpontaneityPropertiesThrombus Aging +---------+---------------+---------+-----------+----------+--------------+ CFV       Full           Yes      Yes                                 +---------+---------------+---------+-----------+----------+--------------+ SFJ      Full                                                        +---------+---------------+---------+-----------+----------+--------------+ FV Prox  Full                                                        +---------+---------------+---------+-----------+----------+--------------+ FV Mid                  Yes      Yes                                 +---------+---------------+---------+-----------+----------+--------------+ FV Distal               Yes      Yes                                 +---------+---------------+---------+-----------+----------+--------------+ PFV  Full                                                        +---------+---------------+---------+-----------+----------+--------------+ POP      Full           Yes      Yes                                 +---------+---------------+---------+-----------+----------+--------------+ PTV      Full                                                        +---------+---------------+---------+-----------+----------+--------------+ PERO     Full                                                        +---------+---------------+---------+-----------+----------+--------------+   +----+---------------+---------+-----------+----------+--------------+ LEFTCompressibilityPhasicitySpontaneityPropertiesThrombus Aging +----+---------------+---------+-----------+----------+--------------+ CFV Full           Yes      Yes                                 +----+---------------+---------+-----------+----------+--------------+     Summary: RIGHT: - There is no evidence of deep vein thrombosis in the lower extremity. However, portions of this examination were limited- see technologist comments above.  - A cystic structure measuring 1.4 cm high by 2.9 cm  wide by 3 cm long is found in the popliteal fossa.  LEFT: - No evidence of common femoral vein obstruction.  *See table(s) above for measurements and observations. Electronically signed by Harold Barban MD on 12/07/2019 at 4:53:28 PM.    Final     Assessment/Plan: 3 Days Post-Op Procedure(s) (LRB): RIGHT TOTAL HIP ARTHROPLASTY ANTERIOR APPROACH  DIRECT ANTERIOR (Right) Up with therapy, home later today. No BM yet. She states she wants to wait until she gets home.  Sydney [name] 12/08/2019, 8:59 AM

## 2019-12-08 NOTE — Plan of Care (Signed)
  Problem: Pain Managment: Goal: General experience of comfort will improve Outcome: Progressing   Problem: Safety: Goal: Ability to remain free from injury will improve Outcome: Progressing   

## 2019-12-08 NOTE — Progress Notes (Signed)
Physical Therapy Treatment Patient Details Name: Sydney [name] MRN: [id number] DOB: [birth year] Today's Date: 12/08/2019    History of Present Illness Pt is 59 yo female admitted for R direct anterior THA after severe OA R hip and PMH: L THA 2/21, DDD, asthma, HTN, chronic pain.     PT Comments    Pt making fair progress towards her physical therapy goals. Session focused on progression of functional mobility and gait training. Pt ambulating 20 feet x 2 with a walker; requiring one standing rest break. She continues with significantly decreased endurance and RLE weakness. Wheelchair remains appropriate for mobility as she is not yet walking household distances. Reviewed wheelchair parts/management. Also, instructed pt on various methods for stair negotiation. Pt too fatigued to attempt stair training this session.    Follow Up Recommendations  Home health PT;Supervision for mobility/OOB     Equipment Recommendations  Wheelchair (measurements PT);Wheelchair cushion (measurements PT)    Recommendations for Other Services       Precautions / Restrictions Precautions Precautions: Fall Restrictions Weight Bearing Restrictions: Yes RLE Weight Bearing: Weight bearing as tolerated    Mobility  Bed Mobility Overal bed mobility: Needs Assistance Bed Mobility: Supine to Sit     Supine to sit: Min guard     General bed mobility comments: Able to progress from supine to sitting with significantly increased time/effort. Pt utilizing sock to pull RLE over edge   Transfers Overall transfer level: Needs assistance Equipment used: Rolling walker (2 wheeled) Transfers: Sit to/from Stand Sit to Stand: Min guard         General transfer comment: Increased time/effort to stand from edge of bed, close min guard.   Ambulation/Gait Ambulation/Gait assistance: Min guard Gait Distance (Feet): 20 Feet(20", 20") Assistive device: Rolling walker (2 wheeled) Gait Pattern/deviations: Step-to  pattern;Decreased weight shift to right;Decreased stance time - right;Antalgic;Trunk flexed;Step-through pattern Gait velocity: decreased Gait velocity interpretation: <1.31 ft/sec, indicative of household ambulator General Gait Details: Cues for neutral right foot alignment, min guard for safety. Pt requiring one standing rest break.    Stairs             Wheelchair Mobility    Modified Rankin (Stroke Patients Only)       Balance Overall balance assessment: Needs assistance Sitting-balance support: Feet supported Sitting balance-Leahy Scale: Good     Standing balance support: Bilateral upper extremity supported Standing balance-Leahy Scale: Poor                              Cognition Arousal/Alertness: Awake/alert Behavior During Therapy: WFL for tasks assessed/performed Overall Cognitive Status: Within Functional Limits for tasks assessed                                        Exercises      General Comments        Pertinent Vitals/Pain Pain Assessment: Faces Faces Pain Scale: Hurts even more Pain Location: R hip Pain Descriptors / Indicators: Operative site guarding;Grimacing Pain Intervention(s): Monitored during session;Limited activity within patient's tolerance    Home Living                      Prior Function            PT Goals (current goals can now be found in the care plan section) Acute Rehab  PT Goals Patient Stated Goal: return home, "be more mobile." Potential to Achieve Goals: Good Progress towards PT goals: Progressing toward goals    Frequency    7X/week      PT Plan Current plan remains appropriate    Co-evaluation              AM-PAC PT "6 Clicks" Mobility   Outcome Measure  Help needed turning from your back to your side while in a flat bed without using bedrails?: None Help needed moving from lying on your back to sitting on the side of a flat bed without using bedrails?:  A Little Help needed moving to and from a bed to a chair (including a wheelchair)?: A Little Help needed standing up from a chair using your arms (e.g., wheelchair or bedside chair)?: A Little Help needed to walk in hospital room?: A Little Help needed climbing 3-5 steps with a railing? : A Lot 6 Click Score: 18    End of Session Equipment Utilized During Treatment: Gait belt Activity Tolerance: Patient tolerated treatment well Patient left: in bed;with call bell/phone within reach Nurse Communication: Mobility status PT Visit Diagnosis: Unsteadiness on feet (R26.81);Pain;Difficulty in walking, not elsewhere classified (R26.2) Pain - Right/Left: Right Pain - part of body: Hip     Time: 1610-9604 PT Time Calculation (min) (ACUTE ONLY): 34 min  Charges:  $Gait Training: 8-22 mins $Therapeutic Activity: 8-22 mins                       Wyona Almas, PT, DPT Acute Rehabilitation Services Pager [phone] Office [phone]    Deno Etienne 12/08/2019, 12:56 PM

## 2019-12-15 ENCOUNTER — Ambulatory Visit (INDEPENDENT_AMBULATORY_CARE_PROVIDER_SITE_OTHER): Payer: No Typology Code available for payment source

## 2019-12-15 ENCOUNTER — Encounter: Payer: Self-pay | Admitting: Orthopaedic Surgery

## 2019-12-15 ENCOUNTER — Other Ambulatory Visit: Payer: Self-pay

## 2019-12-15 ENCOUNTER — Ambulatory Visit (INDEPENDENT_AMBULATORY_CARE_PROVIDER_SITE_OTHER): Payer: No Typology Code available for payment source | Admitting: Orthopaedic Surgery

## 2019-12-15 VITALS — Ht 65.0 in | Wt 231.0 lb

## 2019-12-15 DIAGNOSIS — Z96641 Presence of right artificial hip joint: Secondary | ICD-10-CM

## 2019-12-20 NOTE — Progress Notes (Signed)
Post-Op Visit Note   Patient: Sydney [name]           Date of Birth: 12-08-1960           MRN: [id number] Visit Date: 12/15/2019 PCP: Raliegh Ip, DO   Assessment & Plan: Post right total hip arthroplasty.  Incisions well-healed staples are removed.  Recheck 4 weeks.  Chief Complaint:  Chief Complaint  Patient presents with  . Right Hip - Routine Post Op    12/05/2019 Right THA   Visit Diagnoses:  1. Status post total replacement of right hip     Plan: Post total of arthroplasty right hip.  She is ambulatory with her walker she will work her way to the cane.  She is happy with the surgical result.  Follow-Up Instructions: No follow-ups on file.   Orders:  Orders Placed This Encounter  Procedures  . XR HIP UNILAT W OR W/O PELVIS 2-3 VIEWS RIGHT   No orders of the defined types were placed in this encounter.   Imaging: No results found.  PMFS History: Patient Active Problem List   Diagnosis Date Noted  . H/O total hip arthroplasty 12/06/2019  . Arthritis of right hip 12/05/2019  . Unilateral primary osteoarthritis, right hip 11/17/2019  . Status post total replacement of left hip 09/15/2019  . Back pain 07/01/2018  . DDD (degenerative disc disease), lumbosacral 07/01/2018  . Venous insufficiency of both lower extremities 11/17/2016  . 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 Morbid obesity with BMI of 40.0-44.9, adult (HCC) 07/28/2016  . Chronic pain syndrome 10/24/2015  . Dysfunction of both eustachian tubes 07/26/2014  . Calculus of left kidney 04/25/2014  . Impaired fasting glucose 04/05/2014  . History of bladder surgery 04/03/2014  . History of hysterectomy 04/03/2014  . Urge incontinence of urine 04/03/2014  . Asthma 07/28/2013  . Nontoxic uninodular goiter 07/28/2013  . Solitary pulmonary nodule 07/28/2013  . Lumbar radiculopathy 04/20/2013  . Insomnia 03/22/2013  . Essential hypertension 08/18/2011  . Tobacco  use disorder 08/18/2011  . Moderate episode of recurrent major depressive disorder (HCC) 04/03/2011  . Other B-complex deficiencies 08/16/2010  . Family history of cerebrovascular accident (CVA) 04/17/2010  . Family history of malignant neoplasm of gastrointestinal tract 04/17/2010  . Family history of other cardiovascular diseases(V17.49) 04/17/2010   Past Medical History:  Diagnosis Date  . Allergy   . Anemia    history of  . Anxiety   . Arthritis   . Asthma   . Atherosclerosis   . Depression   . Family history of adverse reaction to anesthesia    pt's mother felt everything and couldn't speak during a gallbladder surgery  . GERD (gastroesophageal reflux disease)   . History of hiatal hernia   . History of kidney stones   . Hypertension   . Pneumonia    a few times  . TIA (transient ischemic attack) 2012    Family History  Problem Relation Age of Onset  . Anxiety disorder Mother   . Depression Mother   . Heart disease Mother   . Hypertension Mother   . Arrhythmia Mother   . Cancer Father   . Lung cancer Father   . Migraines Sister   . Alcohol abuse Brother   . Cancer Daughter        nerve sheath sarcoma    Past Surgical History:  Procedure Laterality Date  . ABDOMINAL HYSTERECTOMY     abdominal  . APPENDECTOMY    .  bladder tack    . CARPAL TUNNEL RELEASE Bilateral   . CERVICAL SPINE SURGERY     5-6, 6-7  . CHOLECYSTECTOMY    . HAMMER TOE SURGERY Right   . JOINT REPLACEMENT    . TOTAL HIP 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   Procedure: LEFT TOTAL HIP ARTHROPLASTY -DIRECT ANTERIOR;  Surgeon: Marybelle Killings, MD;  Location: [facility];  Service: Orthopedics;  Laterality: Left;  . TOTAL HIP ARTHROPLASTY Right 12/05/2019   Procedure: RIGHT TOTAL HIP ARTHROPLASTY ANTERIOR APPROACH  DIRECT ANTERIOR;  Surgeon: Marybelle Killings, MD;  Location: [facility];  Service: Orthopedics;  Laterality: Right;   Social History   Occupational History  . Not on file  Tobacco Use  . Smoking status:  Current Every Day Smoker    Packs/day: 0.50    Years: 23.00    Pack years: 11.50    Types: Cigarettes  . Smokeless tobacco: Never Used  Substance and Sexual Activity  . Alcohol use: Yes    Comment: occ  . Drug use: Never  . Sexual activity: Not Currently

## 2019-12-27 ENCOUNTER — Telehealth: Payer: Self-pay | Admitting: Radiology

## 2019-12-27 NOTE — Telephone Encounter (Signed)
noted 

## 2019-12-27 NOTE — Telephone Encounter (Signed)
I called. 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 I went over several exercises to do

## 2019-12-27 NOTE — Telephone Encounter (Signed)
Patient would like to talk with you. She recently had Right THA which is doing great. She is however having difficulty with the right knee wanting to hold her up, she is unable to do steps, and states that she is still having to use the walker. She would like a call back to discuss.  CB [phone]

## 2019-12-27 NOTE — Discharge Summary (Signed)
Patient ID: Sydney [name] MRN: [id number] DOB/AGE: 12-31-1960 59 y.o.  Admit date: 12/05/2019 Discharge date: 12/08/2019 Admission Diagnoses:  Active Problems:   Arthritis of right hip   H/O total hip arthroplasty   Discharge Diagnoses:  Active Problems:   Arthritis of right hip   H/O total hip arthroplasty  status post Procedure(s): RIGHT TOTAL HIP ARTHROPLASTY ANTERIOR APPROACH  DIRECT ANTERIOR  Past Medical History:  Diagnosis Date  . Allergy   . Anemia    history of  . Anxiety   . Arthritis   . Asthma   . Atherosclerosis   . Depression   . Family history of adverse reaction to anesthesia    pt's mother felt everything and couldn't speak during a gallbladder surgery  . GERD (gastroesophageal reflux disease)   . History of hiatal hernia   . History of kidney stones   . Hypertension   . Pneumonia    a few times  . TIA (transient ischemic attack) 2012    Surgeries: Procedure(s): RIGHT TOTAL HIP ARTHROPLASTY ANTERIOR APPROACH  DIRECT ANTERIOR on 12/05/2019   Consultants:   Discharged Condition: Improved  Hospital Course: Sydney [name] is an 59 y.o. female who was admitted 12/05/2019 for operative treatment of <principal problem not specified>. Patient failed conservative treatments (please see the history and physical for the specifics) and had severe unremitting pain that affects sleep, daily activities and work/hobbies. After pre-op clearance, the patient was taken to the operating room on 12/05/2019 and underwent  Procedure(s): RIGHT TOTAL HIP ARTHROPLASTY ANTERIOR APPROACH  DIRECT ANTERIOR.    Patient was given perioperative antibiotics:  Anti-infectives (From admission, onward)   Start     Dose/Rate Route Frequency Ordered Stop   12/05/19 0600  vancomycin (VANCOREADY) IVPB 1500 mg/300 mL     1,500 mg 150 mL/hr over 120 Minutes Intravenous On call to O.R. 12/02/19 1237 12/05/19 1837       Patient was given sequential compression devices and early  ambulation to prevent DVT.   Patient benefited maximally from hospital stay and there were no complications. At the time of discharge, the patient was urinating/moving their bowels without difficulty, tolerating a regular diet, pain is controlled with oral pain medications and they have been cleared by PT/OT.   Recent vital signs: No data found.   Recent laboratory studies: No results for input(s): WBC, HGB, HCT, PLT, NA, K, CL, CO2, BUN, CREATININE, GLUCOSE, INR, CALCIUM in the last 72 hours.  Invalid input(s): PT, 2   Discharge Medications:   Allergies as of 12/08/2019      Reactions   Bee Pollen Anaphylaxis, Swelling   Bee Venom Anaphylaxis, Swelling   Butorphanol Other (See Comments)   Not sure told by md she was allergic after surgery.   Meloxicam Anxiety   Mood disorder Altered her personality   Penicillins Anaphylaxis, Hives   Unable to Recall As child mom was told she is highly allergic Did it involve swelling of the face/tongue/throat, SOB, or low BP? Unknown Did it involve sudden or severe rash/hives, skin peeling, or any reaction on the inside of your mouth or nose? Unknown Did you need to seek medical attention at a hospital or doctor's office? Unknown When did it last happen?childhood allergy If all above answers are "NO", may proceed with cephalosporin use.   Prochlorperazine Edisylate Anaphylaxis   Sulfa Antibiotics Anaphylaxis   Unable to Recall   Tramadol Anxiety   Didn't like the way it made her feel   Topiramate Nausea And  Vomiting      Medication List    STOP taking these medications   fexofenadine 180 MG tablet Commonly known as: ALLEGRA   HYDROcodone-acetaminophen 5-325 MG tablet Commonly known as: NORCO/VICODIN   oxyCODONE-acetaminophen 5-325 MG tablet Commonly known as: PERCOCET/ROXICET Replaced by: oxyCODONE-acetaminophen 7.5-325 MG tablet     TAKE these medications   albuterol 108 (90 Base) MCG/ACT inhaler Commonly known as: ProAir  HFA Inhale 2 puffs into the lungs every 6 (six) hours as needed for wheezing or shortness of breath. What changed: Another medication with the same name was added. Make sure you understand how and when to take each.   albuterol 108 (90 Base) MCG/ACT inhaler Commonly known as: VENTOLIN HFA Inhale 2 puffs into the lungs every 6 (six) hours as needed for wheezing or shortness of breath. What changed: You were already taking a medication with the same name, and this prescription was added. Make sure you understand how and when to take each.   amLODipine 5 MG tablet Commonly known as: NORVASC Take 1 tablet (5 mg total) by mouth daily.   aspirin EC 325 MG tablet Take 1 tablet (325 mg total) by mouth daily. What changed: when to take this   calcium carbonate 500 MG chewable tablet Commonly known as: TUMS - dosed in mg elemental calcium Chew 2 tablets by mouth daily as needed for indigestion or heartburn.   FLINTSTONES COMPLETE PO Take 1 tablet by mouth 3 (three) times a week.   methocarbamol 500 MG tablet Commonly known as: Robaxin Take 1 tablet (500 mg total) by mouth every 6 (six) hours as needed for muscle spasms.   metoprolol succinate 25 MG 24 hr tablet Commonly known as: TOPROL-XL Take 1 tablet (25 mg total) by mouth daily.   oxyCODONE-acetaminophen 7.5-325 MG tablet Commonly known as: Percocet Take 1 tablet by mouth every 4 (four) hours as needed for severe pain. Replaces: oxyCODONE-acetaminophen 5-325 MG tablet   sertraline 100 MG tablet Commonly known as: ZOLOFT Take 100 mg by mouth daily.   traZODone 100 MG tablet Commonly known as: DESYREL Take 200 mg by mouth at bedtime.       Diagnostic Studies: DG Chest 2 View  Result Date: 12/02/2019 CLINICAL DATA:  Preop hip surgery EXAM: CHEST - 2 VIEW COMPARISON:  08/24/2019 FINDINGS: Surgical hardware in the cervical spine. No focal opacity or pleural effusion. Normal heart size. No pneumothorax. IMPRESSION: No active  cardiopulmonary disease. Electronically Signed   By: Donavan Foil M.D.   On: 12/02/2019 22:48   DG C-Arm 1-60 Min  Result Date: 12/05/2019 CLINICAL DATA:  Right hip degenerative arthropathy, status post hip replacement EXAM: DG C-ARM 1-60 MIN COMPARISON:  None. FINDINGS: Spot fluoroscopic views during the procedure demonstrate new total right hip arthroplasty. Components appear aligned in the frontal plane. No complicating feature. IMPRESSION: Status post right hip arthroplasty and remote left hip arthroplasty. No complicating features. Electronically Signed   By: Jerilynn Mages.  Shick M.D.   On: 12/05/2019 16:15   DG Hip Port Unilat With Pelvis 1V Right  Result Date: 12/05/2019 CLINICAL DATA:  59 year old female status post right hip arthroplasty. EXAM: DG HIP (WITH OR WITHOUT PELVIS) 1V PORT RIGHT COMPARISON:  Intraoperative images 14 18 hours today. Right hip series 08/31/2019. FINDINGS: Portable AP and cross-table lateral views of the pelvis and right hip at 1655 hours. Bilateral hip arthroplasty now, bipolar hardware on the right with normal AP alignment. The cross-table lateral is under penetrated. No unexpected osseous changes. Chronic small  surgical clips near the pelvic floor/pubic symphysis. Overlying skin staples on the right. IMPRESSION: Under penetrated cross-table lateral view but no adverse features identified status post right hip arthroplasty. Electronically Signed   By: Odessa Fleming M.D.   On: 12/05/2019 17:24   DG HIP OPERATIVE UNILAT W OR W/O PELVIS RIGHT  Result Date: 12/05/2019 CLINICAL DATA:  RIGHT hip arthroplasty anterior approach EXAM: OPERATIVE RIGHT HIP (WITH PELVIS IF PERFORMED) 3 VIEWS TECHNIQUE: Fluoroscopic spot image(s) were submitted for interpretation post-operatively. COMPARISON:  Pelvic radiograph 08/31/2019 FLUOROSCOPY TIME:  0 minutes 40 seconds Dose: 6.88 mGy FINDINGS: Osseous demineralization. Indwelling LEFT hip prosthesis. Osteoarthritic changes RIGHT hip joint with joint space  narrowing and spur formation. Placement of RIGHT hip prosthesis without acute fracture or dislocation. IMPRESSION: Osteoarthritis RIGHT hip and osseous demineralization. RIGHT hip prosthesis without acute complication. Electronically Signed   By: Ulyses Southward M.D.   On: 12/05/2019 16:15   XR HIP UNILAT W OR W/O PELVIS 2-3 VIEWS RIGHT  Result Date: 12/20/2019 Standing AP pelvis frog-leg lateral right hip obtained and reviewed.  This shows well-positioned right total hip arthroplasty without loosening or subsidence. Impression: Satisfactory postop right total hip arthroplasty.  VAS [place] LOWER EXTREMITY VENOUS (DVT)  Result Date: 12/07/2019  Lower Venous DVTStudy Indications: Pain, and Swelling.  Limitations: Body habitus, poor ultrasound/tissue interface and patient pain tolerance, patient positioning. Comparison Study: No prior studies. Performing Technologist: Chanda Busing RVT  Examination Guidelines: A complete evaluation includes B-mode imaging, spectral Doppler, color Doppler, and power Doppler as needed of all accessible portions of each vessel. Bilateral testing is considered an integral part of a complete examination. Limited examinations for reoccurring indications may be performed as noted. The reflux portion of the exam is performed with the patient in reverse Trendelenburg.  +---------+---------------+---------+-----------+----------+--------------+ RIGHT    CompressibilityPhasicitySpontaneityPropertiesThrombus Aging +---------+---------------+---------+-----------+----------+--------------+ CFV      Full           Yes      Yes                                 +---------+---------------+---------+-----------+----------+--------------+ SFJ      Full                                                        +---------+---------------+---------+-----------+----------+--------------+ FV Prox  Full                                                         +---------+---------------+---------+-----------+----------+--------------+ FV Mid                  Yes      Yes                                 +---------+---------------+---------+-----------+----------+--------------+ FV Distal               Yes      Yes                                 +---------+---------------+---------+-----------+----------+--------------+  PFV      Full                                                        +---------+---------------+---------+-----------+----------+--------------+ POP      Full           Yes      Yes                                 +---------+---------------+---------+-----------+----------+--------------+ PTV      Full                                                        +---------+---------------+---------+-----------+----------+--------------+ PERO     Full                                                        +---------+---------------+---------+-----------+----------+--------------+   +----+---------------+---------+-----------+----------+--------------+ LEFTCompressibilityPhasicitySpontaneityPropertiesThrombus Aging +----+---------------+---------+-----------+----------+--------------+ CFV Full           Yes      Yes                                 +----+---------------+---------+-----------+----------+--------------+     Summary: RIGHT: - There is no evidence of deep vein thrombosis in the lower extremity. However, portions of this examination were limited- see technologist comments above.  - A cystic structure measuring 1.4 cm high by 2.9 cm wide by 3 cm long is found in the popliteal fossa.  LEFT: - No evidence of common femoral vein obstruction.  *See table(s) above for measurements and observations. Electronically signed by Coral Else MD on 12/07/2019 at 4:53:28 PM.    Final       Follow-up Information    Eldred Manges, MD. Schedule an appointment as soon as possible for a visit today.    Specialty: Orthopedic Surgery Why: need return office visit 2 weeks postop Contact information: [address] [place] [address] [phone]           Discharge Plan:  discharge to home  Disposition:     Signed: Zonia Kief  12/27/2019, 3:08 PM

## 2020-01-09 ENCOUNTER — Ambulatory Visit (INDEPENDENT_AMBULATORY_CARE_PROVIDER_SITE_OTHER): Payer: No Typology Code available for payment source

## 2020-01-09 ENCOUNTER — Ambulatory Visit (INDEPENDENT_AMBULATORY_CARE_PROVIDER_SITE_OTHER): Payer: No Typology Code available for payment source | Admitting: Orthopaedic Surgery

## 2020-01-09 ENCOUNTER — Other Ambulatory Visit: Payer: Self-pay

## 2020-01-09 ENCOUNTER — Encounter: Payer: Self-pay | Admitting: Orthopaedic Surgery

## 2020-01-09 VITALS — BP 124/84 | HR 87 | Ht 65.0 in | Wt 230.0 lb

## 2020-01-09 DIAGNOSIS — G8929 Other chronic pain: Secondary | ICD-10-CM

## 2020-01-09 DIAGNOSIS — M545 Low back pain: Secondary | ICD-10-CM

## 2020-01-09 DIAGNOSIS — M25561 Pain in right knee: Secondary | ICD-10-CM

## 2020-01-09 NOTE — Progress Notes (Signed)
Post-Op Visit Note   Patient: Sydney [name]           Date of Birth: 04-Nov-1960           MRN: [id number] Visit Date: 01/09/2020 PCP: Janora Norlander, DO   Assessment & Plan: Patient turned she is having some weakness in right leg post total hip arthroplasty 12/05/2019.  Incisions well-healed.  She can straight leg raise but still has some weakness.  Mild weakness opposite left hip which was replaced February 2021.  X-rays reviewed which shows apparent new superior endplate L2 fracture.  She will continue to work on quad strengthening and ambulation currently on a cane.  She has had some pain that radiates down lateral thigh into mid calf but not to the foot.  Recheck in August.  Repeat AP lateral lumbar spine x-ray on return visit.  Chief Complaint:  Chief Complaint  Patient presents with   Lower Back - Pain   Right Leg - Pain   Visit Diagnoses:  1. Chronic right-sided low back pain, unspecified whether sciatica present   2. Chronic pain of right knee     Plan: Continue ambulation continue quad strengthening we discussed while squat straight leg raising using her purse is a counterweight and using the recumbent bike that she has at home.  Follow-Up Instructions: as scheduled  Orders:  Orders Placed This Encounter  Procedures   XR Lumbar Spine 2-3 Views   XR Pelvis 1-2 Views   XR Knee 1-2 Views Right   No orders of the defined types were placed in this encounter.   Imaging: No results found.  PMFS History: Patient Active Problem List   Diagnosis Date Noted   H/O total hip arthroplasty 12/06/2019   Arthritis of right hip 12/05/2019   Unilateral primary osteoarthritis, right hip 11/17/2019   Status post total replacement of left hip 09/15/2019   Back pain 07/01/2018   DDD (degenerative disc disease), lumbosacral 07/01/2018   Venous insufficiency of both lower extremities 11/17/2016   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 level with radiculopathy 08/18/2016     Morbid obesity with BMI of 40.0-44.9, adult ([place]) 07/28/2016   Chronic pain syndrome 10/24/2015   Dysfunction of both eustachian tubes 07/26/2014   Calculus of left kidney 04/25/2014   Impaired fasting glucose 04/05/2014   History of bladder surgery 04/03/2014   History of hysterectomy 04/03/2014   Urge incontinence of urine 04/03/2014   Asthma 07/28/2013   Nontoxic uninodular goiter 07/28/2013   Solitary pulmonary nodule 07/28/2013   Lumbar radiculopathy 04/20/2013   Insomnia 03/22/2013   Essential hypertension 08/18/2011   Tobacco use disorder 08/18/2011   Moderate episode of recurrent major depressive disorder ([place]) 04/03/2011   Other B-complex deficiencies 08/16/2010   Family history of cerebrovascular accident (CVA) 04/17/2010   Family history of malignant neoplasm of gastrointestinal tract 04/17/2010   Family history of other cardiovascular diseases(V17.49) 04/17/2010   Past Medical History:  Diagnosis Date   Allergy    Anemia    history of   Anxiety    Arthritis    Asthma    Atherosclerosis    Depression    Family history of adverse reaction to anesthesia    pt's mother felt everything and couldn't speak during a gallbladder surgery   GERD (gastroesophageal reflux disease)    History of hiatal hernia    History of kidney stones    Hypertension    Pneumonia    a few times   TIA (transient ischemic attack)  2012    Family History  Problem Relation Age of Onset   Anxiety disorder Mother    Depression Mother    Heart disease Mother    Hypertension Mother    Arrhythmia Mother    Cancer Father    Lung cancer Father    Migraines Sister    Alcohol abuse Brother    Cancer Daughter        nerve sheath sarcoma    Past Surgical History:  Procedure Laterality Date   ABDOMINAL HYSTERECTOMY     abdominal   APPENDECTOMY     bladder tack     CARPAL TUNNEL RELEASE Bilateral    CERVICAL SPINE SURGERY     5-6, 6-7    CHOLECYSTECTOMY     HAMMER TOE SURGERY Right    JOINT REPLACEMENT     TOTAL HIP 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   Procedure: LEFT TOTAL HIP ARTHROPLASTY -DIRECT ANTERIOR;  Surgeon: Eldred Manges, MD;  Location: [facility] OR;  Service: Orthopedics;  Laterality: Left;   TOTAL HIP ARTHROPLASTY Right 12/05/2019   Procedure: RIGHT TOTAL HIP ARTHROPLASTY ANTERIOR APPROACH  DIRECT ANTERIOR;  Surgeon: Eldred Manges, MD;  Location: [facility] OR;  Service: Orthopedics;  Laterality: Right;   Social History   Occupational History   Not on file  Tobacco Use   Smoking status: Current Every Day Smoker    Packs/day: 0.50    Years: 23.00    Pack years: 11.50    Types: Cigarettes   Smokeless tobacco: Never Used  Vaping Use   Vaping Use: Never used  Substance and Sexual Activity   Alcohol use: Yes    Comment: occ   Drug use: Never   Sexual activity: Not Currently

## 2020-02-14 ENCOUNTER — Encounter: Payer: Self-pay | Admitting: Family Medicine

## 2020-02-14 ENCOUNTER — Other Ambulatory Visit: Payer: Self-pay

## 2020-02-14 ENCOUNTER — Ambulatory Visit: Payer: 59 | Admitting: Family Medicine

## 2020-02-14 VITALS — BP 128/80 | HR 84 | Temp 97.8°F | Ht 65.0 in | Wt 228.0 lb

## 2020-02-14 DIAGNOSIS — T63481A Toxic effect of venom of other arthropod, accidental (unintentional), initial encounter: Secondary | ICD-10-CM

## 2020-02-14 DIAGNOSIS — T63481D Toxic effect of venom of other arthropod, accidental (unintentional), subsequent encounter: Secondary | ICD-10-CM

## 2020-02-14 DIAGNOSIS — G8929 Other chronic pain: Secondary | ICD-10-CM

## 2020-02-14 DIAGNOSIS — M545 Low back pain: Secondary | ICD-10-CM

## 2020-02-14 MED ORDER — HYDROCODONE-ACETAMINOPHEN 5-325 MG PO TABS: 1.0000 | ORAL_TABLET | Freq: Four times a day (QID) | ORAL | 0 refills | Status: DC | PRN

## 2020-02-14 NOTE — Patient Instructions (Signed)
Short supply of vicodin given for severe pain.  Future pain medication per your specialist.

## 2020-02-14 NOTE — Progress Notes (Signed)
Subjective: CC: Follow-up stings PCP: Raliegh Ip, DO WNI:OEVO Kates is a 59 y.o. female presenting to clinic today for:  1.  Follow-up stings Patient was visiting family when she was stung by a yellow jacket x2.  The first time, she did have a mild reaction but the following staying because full body hives.  She was seen in the emergency department after symptoms were refractory to oral Benadryl.  She was given prednisone and discharged home.  Symptoms have totally resolved.  She was prescribed an EpiPen but did not get it filled because she was afraid about cost.  She has not yet checked into her mail order to see if this would be cost effective  2.  Chronic low back pain Patient reports ongoing chronic low back pain that is midline.  She will be seeing her orthopedist in the next couple of weeks for discussion further of this.  She was previously prescribed pain medication and had 1 tablet of Vicodin left over which she used yesterday.  She notes that this did help with the pain.  She uses clonazepam extremely rarely and in fact has not used but 2 pills in the last 6 months.  She knows never to use this along with opioids.  No fecal incontinence, urinary tension or saddle anesthesia.  No falls.  ROS: Per HPI  Allergies  Allergen Reactions   Bee Pollen Anaphylaxis and Swelling   Bee Venom Anaphylaxis and Swelling   Butorphanol Other (See Comments)    Not sure told by md she was allergic after surgery.    Meloxicam Anxiety    Mood disorder Altered her personality    Penicillins Anaphylaxis and Hives    Unable to Recall As child mom was told she is highly allergic Did it involve swelling of the face/tongue/throat, SOB, or low BP? Unknown Did it involve sudden or severe rash/hives, skin peeling, or any reaction on the inside of your mouth or nose? Unknown Did you need to seek medical attention at a hospital or doctor's office? Unknown When did it last  happen?childhood allergy If all above answers are NO, may proceed with cephalosporin use.    Prochlorperazine Edisylate Anaphylaxis   Sulfa Antibiotics Anaphylaxis    Unable to Recall   Tramadol Anxiety    Didn't like the way it made her feel    Topiramate Nausea And Vomiting   Past Medical History:  Diagnosis Date   Allergy    Anemia    history of   Anxiety    Arthritis    Asthma    Atherosclerosis    Depression    Family history of adverse reaction to anesthesia    pt's mother felt everything and couldn't speak during a gallbladder surgery   GERD (gastroesophageal reflux disease)    History of hiatal hernia    History of kidney stones    Hypertension    Pneumonia    a few times   TIA (transient ischemic attack) 2012    Current Outpatient Medications:    amLODipine (NORVASC) 5 MG tablet, Take 1 tablet (5 mg total) by mouth daily., Disp: 90 tablet, Rfl: 2   calcium carbonate (TUMS - DOSED IN MG ELEMENTAL CALCIUM) 500 MG chewable tablet, Chew 2 tablets by mouth daily as needed for indigestion or heartburn., Disp: , Rfl:    clonazePAM (KLONOPIN) 0.5 MG tablet, Take 0.5 mg by mouth daily as needed for anxiety., Disp: , Rfl:    metoprolol succinate (TOPROL-XL) 25 MG  24 hr tablet, Take 1 tablet (25 mg total) by mouth daily., Disp: 90 tablet, Rfl: 2   Pediatric Multivitamins-Iron (FLINTSTONES COMPLETE PO), Take 1 tablet by mouth 3 (three) times a week., Disp: , Rfl:    sertraline (ZOLOFT) 100 MG tablet, Take 100 mg by mouth daily. , Disp: , Rfl:    traZODone (DESYREL) 100 MG tablet, Take 200 mg by mouth at bedtime. , Disp: , Rfl:    albuterol (PROAIR HFA) 108 (90 Base) MCG/ACT inhaler, Inhale 2 puffs into the lungs every 6 (six) hours as needed for wheezing or shortness of breath., Disp: 18 g, Rfl: 2   albuterol (VENTOLIN HFA) 108 (90 Base) MCG/ACT inhaler, Inhale 2 puffs into the lungs every 6 (six) hours as needed for wheezing or shortness of  breath., Disp: 18 g, Rfl: 0 Social History   Socioeconomic History   Marital status: Married    Spouse name: Not on file   Number of children: 3   Years of education: Not on file   Highest education level: Not on file  Occupational History   Not on file  Tobacco Use   Smoking status: Current Every Day Smoker    Packs/day: 0.50    Years: 23.00    Pack years: 11.50    Types: Cigarettes   Smokeless tobacco: Never Used  Vaping Use   Vaping Use: Never used  Substance and Sexual Activity   Alcohol use: Yes    Comment: occ   Drug use: Never   Sexual activity: Not Currently  Other Topics Concern   Not on file  Social History Narrative   Recently relocated to [place] from [place].  She resides at home with her husband.   She has 3 children but 1 passed away from cancer.   Social Determinants of Health   Financial Resource Strain:    Difficulty of Paying Living Expenses:   Food Insecurity:    Worried About Programme researcher, broadcasting/film/video in the Last Year:    Barista in the Last Year:   Transportation Needs:    Freight forwarder (Medical):    Lack of Transportation (Non-Medical):   Physical Activity:    Days of Exercise per Week:    Minutes of Exercise per Session:   Stress:    Feeling of Stress :   Social Connections:    Frequency of Communication with Friends and Family:    Frequency of Social Gatherings with Friends and Family:    Attends Religious Services:    Active Member of Clubs or Organizations:    Attends Engineer, structural:    Marital Status:   Intimate Partner Violence:    Fear of Current or Ex-Partner:    Emotionally Abused:    Physically Abused:    Sexually Abused:    Family History  Problem Relation Age of Onset   Anxiety disorder Mother    Depression Mother    Heart disease Mother    Hypertension Mother    Arrhythmia Mother    Cancer Father    Lung cancer Father    Migraines Sister     Alcohol abuse Brother    Cancer Daughter        nerve sheath sarcoma    Objective: Office vital signs reviewed. BP 128/80    Pulse 84    Temp 97.8 F (36.6 C) (Temporal)    Ht 5\' 5"  (1.651 m)    Wt (!) 228 lb (103.4 kg)  SpO2 97%    BMI 37.94 kg/m   Physical Examination:  General: Awake, alert, well nourished, No acute distress HEENT: Normal, sclera white, MMM Cardio: regular rate and rhythm, S1S2 heard, no murmurs appreciated Pulm: clear to auscultation bilaterally, no wheezes, rhonchi or rales; normal work of breathing on room air Extremities: warm, well perfused, No edema, cyanosis or clubbing; +2 pulses bilaterally MSK: antlagic gait and station Skin: dry; intact; no rashes; she has a small healing sting site on the left dorsal aspect of the upper extremity  Assessment/ Plan: 59 y.o. female   1. Chronic midline low back pain without sciatica Small course of Norco provided to bridge her through to her orthopedist office.  Discussed sparing use of this medication.  We discussed the addictive nature of this medication.  We discussed absolutely no benzodiazepine in the same day of the opioid.  She voiced good understanding.  The national narcotic database was reviewed and I see that she had a pain medication refill in May but this is appropriate as it has been coming from her orthopedist consistently. - HYDROcodone-acetaminophen (NORCO) 5-325 MG tablet; Take 1 tablet by mouth every 6 (six) hours as needed for severe pain.  Dispense: 10 tablet; Refill: 0  2. Allergic reaction to insect sting, accidental or unintentional, subsequent encounter I encouraged her to fill the EpiPen as the next thing may be life-threatening.  She voiced good understanding.   No orders of the defined types were placed in this encounter.  No orders of the defined types were placed in this encounter.    Raliegh Ip, DO [facility] [phone]

## 2020-03-01 ENCOUNTER — Encounter: Payer: Self-pay | Admitting: Orthopaedic Surgery

## 2020-03-01 ENCOUNTER — Ambulatory Visit (INDEPENDENT_AMBULATORY_CARE_PROVIDER_SITE_OTHER): Payer: No Typology Code available for payment source | Admitting: Orthopaedic Surgery

## 2020-03-01 ENCOUNTER — Ambulatory Visit (INDEPENDENT_AMBULATORY_CARE_PROVIDER_SITE_OTHER): Payer: No Typology Code available for payment source

## 2020-03-01 ENCOUNTER — Other Ambulatory Visit: Payer: Self-pay

## 2020-03-01 VITALS — BP 122/79 | Ht 65.0 in | Wt 228.0 lb

## 2020-03-01 DIAGNOSIS — G8929 Other chronic pain: Secondary | ICD-10-CM | POA: Diagnosis not present

## 2020-03-01 DIAGNOSIS — M4156 Other secondary scoliosis, lumbar region: Secondary | ICD-10-CM

## 2020-03-01 DIAGNOSIS — M545 Low back pain: Secondary | ICD-10-CM

## 2020-03-01 NOTE — Progress Notes (Signed)
Post-Op Visit Note   Patient: Sydney [name]           Date of Birth: [birth year]           MRN: [id number] Visit Date: 03/01/2020 PCP: Raliegh Ip, DO   Assessment & Plan: Post right total hip arthroplasty.  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  She is now 3 months out and is still using a cane and has more pronounced limp with back pain buttocks pain that radiates down her right leg down the dorsum of her right foot.  She has had previous cervical fusion surgery done at [facility].  Leg lengths lower extremity are equal hip incisions well-healed.  She has some sciatic notch tenderness on the right negative on the left some pain with straight leg raising.  Anterior tib gastrocsoleus is strong decreased station dorsum of the foot L5 distribution.  Patient still using a cane in her left hand still walking with right hip limp.  Chief Complaint:  Chief Complaint  Patient presents with  . Lower Back - Fracture, Follow-up   Visit Diagnoses:  1. Chronic right-sided low back pain, unspecified whether sciatica present   2. Other secondary scoliosis, lumbar region     Plan: Continue use of a cane.  We discussed abnormal gait may have flared up some back problems that she has.  I will check her back in a month to 7 persistent problems will repeat x-rays of her right hip to make sure there is no loosening or subsidence.  This looks like more right L5 nerve root problems and if her symptoms increase or she develops weakness we will consider diagnostic MRI of her lumbar spine.  Follow-Up Instructions: Return in about 1 month (around 04/01/2020).   Orders:  Orders Placed This Encounter  Procedures  . XR Lumbar Spine 2-3 Views   No orders of the defined types were placed in this encounter.   Imaging: XR Lumbar Spine 2-3 Views  Result Date: 03/01/2020 AP lateral lumbar x-rays are obtained and reviewed.  This shows approximately 30 degree  right lumbar curve apex at L1-2 level.  Facet degenerative changes are seen throughout the lumbar spine without spondylolisthesis.  Previous gallbladder clips noted. Impression lumbar scoliosis right lumbar curve with endplate degenerative changes.   PMFS History: Patient Active Problem List   Diagnosis Date Noted  . Other secondary scoliosis, lumbar region 03/01/2020  . H/O total hip arthroplasty 12/06/2019  . Arthritis of right hip 12/05/2019  . Unilateral primary osteoarthritis, right hip 11/17/2019  . Status post total replacement of left hip 09/15/2019  . Back pain 07/01/2018  . DDD (degenerative disc disease), lumbosacral 07/01/2018  . Venous insufficiency of both lower extremities 11/17/2016  . 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 Morbid obesity with BMI of 40.0-44.9, adult (HCC) 07/28/2016  . Chronic pain syndrome 10/24/2015  . Dysfunction of both eustachian tubes 07/26/2014  . Calculus of left kidney 04/25/2014  . Impaired fasting glucose 04/05/2014  . History of bladder surgery 04/03/2014  . History of hysterectomy 04/03/2014  . Urge incontinence of urine 04/03/2014  . Asthma 07/28/2013  . Nontoxic uninodular goiter 07/28/2013  . Solitary pulmonary nodule 07/28/2013  . Lumbar radiculopathy 04/20/2013  . Insomnia 03/22/2013  . Essential hypertension 08/18/2011  . Tobacco use disorder 08/18/2011  . Moderate episode of recurrent major depressive disorder (HCC) 04/03/2011  . Other B-complex deficiencies 08/16/2010  .  Family history of cerebrovascular accident (CVA) 04/17/2010  . Family history of malignant neoplasm of gastrointestinal tract 04/17/2010  . Family history of other cardiovascular diseases(V17.49) 04/17/2010   Past Medical History:  Diagnosis Date  . Allergy   . Anemia    history of  . Anxiety   . Arthritis   . Asthma   . Atherosclerosis   . Depression   . Family history of adverse reaction to anesthesia    pt's mother  felt everything and couldn't speak during a gallbladder surgery  . GERD (gastroesophageal reflux disease)   . History of hiatal hernia   . History of kidney stones   . Hypertension   . Pneumonia    a few times  . TIA (transient ischemic attack) 2012    Family History  Problem Relation Age of Onset  . Anxiety disorder Mother   . Depression Mother   . Heart disease Mother   . Hypertension Mother   . Arrhythmia Mother   . Cancer Father   . Lung cancer Father   . Migraines Sister   . Alcohol abuse Brother   . Cancer Daughter        nerve sheath sarcoma    Past Surgical History:  Procedure Laterality Date  . ABDOMINAL HYSTERECTOMY     abdominal  . APPENDECTOMY    . bladder tack    . CARPAL TUNNEL RELEASE Bilateral   . CERVICAL SPINE SURGERY     5-6, 6-7  . CHOLECYSTECTOMY    . HAMMER TOE SURGERY Right   . JOINT REPLACEMENT    . TOTAL HIP 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   Procedure: LEFT TOTAL HIP ARTHROPLASTY -DIRECT ANTERIOR;  Surgeon: Eldred Manges, MD;  Location: [facility] OR;  Service: Orthopedics;  Laterality: Left;  . TOTAL HIP ARTHROPLASTY Right 12/05/2019   Procedure: RIGHT TOTAL HIP ARTHROPLASTY ANTERIOR APPROACH  DIRECT ANTERIOR;  Surgeon: Eldred Manges, MD;  Location: [facility] OR;  Service: Orthopedics;  Laterality: Right;   Social History   Occupational History  . Not on file  Tobacco Use  . Smoking status: Current Every Day Smoker    Packs/day: 0.50    Years: 23.00    Pack years: 11.50    Types: Cigarettes  . Smokeless tobacco: Never Used  Vaping Use  . Vaping Use: Never used  Substance and Sexual Activity  . Alcohol use: Yes    Comment: occ  . Drug use: Never  . Sexual activity: Not Currently

## 2020-04-05 ENCOUNTER — Other Ambulatory Visit: Payer: Self-pay

## 2020-04-05 ENCOUNTER — Ambulatory Visit (INDEPENDENT_AMBULATORY_CARE_PROVIDER_SITE_OTHER): Payer: No Typology Code available for payment source | Admitting: Orthopaedic Surgery

## 2020-04-05 ENCOUNTER — Ambulatory Visit (INDEPENDENT_AMBULATORY_CARE_PROVIDER_SITE_OTHER): Payer: No Typology Code available for payment source

## 2020-04-05 DIAGNOSIS — G8929 Other chronic pain: Secondary | ICD-10-CM

## 2020-04-05 DIAGNOSIS — M545 Low back pain: Secondary | ICD-10-CM | POA: Diagnosis not present

## 2020-04-05 DIAGNOSIS — M5416 Radiculopathy, lumbar region: Secondary | ICD-10-CM

## 2020-04-05 DIAGNOSIS — Z96641 Presence of right artificial hip joint: Secondary | ICD-10-CM

## 2020-04-05 MED ORDER — DIAZEPAM 5 MG PO TABS
ORAL_TABLET | ORAL | 0 refills | Status: DC
Start: 2020-04-05 — End: 2020-05-23

## 2020-04-05 NOTE — Progress Notes (Signed)
Office Visit Note   Patient: Sydney [name]           Date of Birth: Aug 17, 1960           MRN: [id number] Visit Date: 04/05/2020              Requested by: Raliegh Ip, DO [address] [place],  [place] [address] PCP: Raliegh Ip, DO   Assessment & Plan: Visit Diagnoses:  1. Chronic right-sided low back pain, unspecified whether sciatica present   2. Status post total replacement of right hip   3. Lumbar radiculopathy     Plan: Patient's hips are doing well but she has persistent limping use of a cane and weakness in the right leg.  We will obtain an MRI scan office follow-up after scan for review.  Valium sent in for claustrophobia.  Follow-Up Instructions: No follow-ups on file.   Orders:  Orders Placed This Encounter  Procedures  . XR HIP UNILAT W OR W/O PELVIS 2-3 VIEWS RIGHT   No orders of the defined types were placed in this encounter.     Procedures: No procedures performed   Clinical Data: No additional findings.   Subjective: Chief Complaint  Patient presents with  . Right Hip - Follow-up, Pain  . Lower Back - Follow-up, Pain    HPI patient returns post bilateral total hip arthroplasties most recent was done on the right.  Repeat x-ray showed good position alignment no subsidence no evidence of loosening.  She has good offset on both hips.  She continues to have pain in the sciatic notch region down her leg radiates down to her ankle.  Review of Systems updated unchanged since her knee surgery.  Of note she does have some disc degeneration at C5-6.  Also scoliosis.   Objective: Vital Signs: There were no vitals taken for this visit.  Physical Exam Constitutional:      Appearance: She is well-developed.  HENT:     Head: Normocephalic.     Right Ear: External ear normal.     Left Ear: External ear normal.  Eyes:     Pupils: Pupils are equal, round, and reactive to light.  Neck:     Thyroid: No thyromegaly.     Trachea: No tracheal  deviation.  Cardiovascular:     Rate and Rhythm: Normal rate.  Pulmonary:     Effort: Pulmonary effort is normal.  Abdominal:     Palpations: Abdomen is soft.  Skin:    General: Skin is warm and dry.  Neurological:     Mental Status: She is alert and oriented to person, place, and time.  Psychiatric:        Behavior: Behavior normal.     Ortho Exam patient has pain with straight leg raising 90 degrees positive popliteal compression test increased pain with foot dorsiflexion.  Minimal sciatic notch tenderness on the left moderate to severe on the right.  Anterior tib EHL is intact.  She has some posterior tib weakness or insufficiency with loss of arch on the right foot.  Resisted inversion remains strong in her foot.  Gastrocsoleus is normal. Specialty Comments:  No specialty comments available.  Imaging: No results found.   PMFS History: Patient Active Problem List   Diagnosis Date Noted  . Other secondary scoliosis, lumbar region 03/01/2020  . H/O total hip arthroplasty 12/06/2019  . Arthritis of right hip 12/05/2019  . Unilateral primary osteoarthritis, right hip 11/17/2019  . Status post total  replacement of left hip 09/15/2019  . Back pain 07/01/2018  . DDD (degenerative disc disease), lumbosacral 07/01/2018  . Venous insufficiency of both lower extremities 11/17/2016  . 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 Morbid obesity with BMI of 40.0-44.9, adult (HCC) 07/28/2016  . Chronic pain syndrome 10/24/2015  . Dysfunction of both eustachian tubes 07/26/2014  . Calculus of left kidney 04/25/2014  . Impaired fasting glucose 04/05/2014  . History of bladder surgery 04/03/2014  . History of hysterectomy 04/03/2014  . Urge incontinence of urine 04/03/2014  . Asthma 07/28/2013  . Nontoxic uninodular goiter 07/28/2013  . Solitary pulmonary nodule 07/28/2013  . Lumbar radiculopathy 04/20/2013  . Insomnia 03/22/2013  . Essential hypertension  08/18/2011  . Tobacco use disorder 08/18/2011  . Moderate episode of recurrent major depressive disorder (HCC) 04/03/2011  . Other B-complex deficiencies 08/16/2010  . Family history of cerebrovascular accident (CVA) 04/17/2010  . Family history of malignant neoplasm of gastrointestinal tract 04/17/2010  . Family history of other cardiovascular diseases(V17.49) 04/17/2010   Past Medical History:  Diagnosis Date  . Allergy   . Anemia    history of  . Anxiety   . Arthritis   . Asthma   . Atherosclerosis   . Depression   . Family history of adverse reaction to anesthesia    pt's mother felt everything and couldn't speak during a gallbladder surgery  . GERD (gastroesophageal reflux disease)   . History of hiatal hernia   . History of kidney stones   . Hypertension   . Pneumonia    a few times  . TIA (transient ischemic attack) 2012    Family History  Problem Relation Age of Onset  . Anxiety disorder Mother   . Depression Mother   . Heart disease Mother   . Hypertension Mother   . Arrhythmia Mother   . Cancer Father   . Lung cancer Father   . Migraines Sister   . Alcohol abuse Brother   . Cancer Daughter        nerve sheath sarcoma    Past Surgical History:  Procedure Laterality Date  . ABDOMINAL HYSTERECTOMY     abdominal  . APPENDECTOMY    . bladder tack    . CARPAL TUNNEL RELEASE Bilateral   . CERVICAL SPINE SURGERY     5-6, 6-7  . CHOLECYSTECTOMY    . HAMMER TOE SURGERY Right   . JOINT REPLACEMENT    . TOTAL HIP 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   Procedure: LEFT TOTAL HIP ARTHROPLASTY -DIRECT ANTERIOR;  Surgeon: Eldred Manges, MD;  Location: [facility] OR;  Service: Orthopedics;  Laterality: Left;  . TOTAL HIP ARTHROPLASTY Right 12/05/2019   Procedure: RIGHT TOTAL HIP ARTHROPLASTY ANTERIOR APPROACH  DIRECT ANTERIOR;  Surgeon: Eldred Manges, MD;  Location: [facility] OR;  Service: Orthopedics;  Laterality: Right;   Social History   Occupational History  . Not on file  Tobacco  Use  . Smoking status: Current Every Day Smoker    Packs/day: 0.50    Years: 23.00    Pack years: 11.50    Types: Cigarettes  . Smokeless tobacco: Never Used  Vaping Use  . Vaping Use: Never used  Substance and Sexual Activity  . Alcohol use: Yes    Comment: occ  . Drug use: Never  . Sexual activity: Not Currently

## 2020-04-10 ENCOUNTER — Encounter: Payer: Self-pay | Admitting: Family Medicine

## 2020-04-10 ENCOUNTER — Other Ambulatory Visit: Payer: Self-pay

## 2020-04-10 ENCOUNTER — Ambulatory Visit (INDEPENDENT_AMBULATORY_CARE_PROVIDER_SITE_OTHER): Payer: No Typology Code available for payment source | Admitting: Family Medicine

## 2020-04-10 VITALS — BP 114/69 | HR 65 | Temp 98.0°F | Ht 65.0 in | Wt 230.0 lb

## 2020-04-10 DIAGNOSIS — Z23 Encounter for immunization: Secondary | ICD-10-CM | POA: Diagnosis not present

## 2020-04-10 DIAGNOSIS — Z1211 Encounter for screening for malignant neoplasm of colon: Secondary | ICD-10-CM | POA: Diagnosis not present

## 2020-04-10 DIAGNOSIS — Z0001 Encounter for general adult medical examination with abnormal findings: Secondary | ICD-10-CM | POA: Diagnosis not present

## 2020-04-10 DIAGNOSIS — G8929 Other chronic pain: Secondary | ICD-10-CM

## 2020-04-10 DIAGNOSIS — Z0000 Encounter for general adult medical examination without abnormal findings: Secondary | ICD-10-CM

## 2020-04-10 DIAGNOSIS — Z1239 Encounter for other screening for malignant neoplasm of breast: Secondary | ICD-10-CM

## 2020-04-10 DIAGNOSIS — D649 Anemia, unspecified: Secondary | ICD-10-CM

## 2020-04-10 DIAGNOSIS — I10 Essential (primary) hypertension: Secondary | ICD-10-CM

## 2020-04-10 DIAGNOSIS — E7800 Pure hypercholesterolemia, unspecified: Secondary | ICD-10-CM | POA: Diagnosis not present

## 2020-04-10 DIAGNOSIS — Z800 Family history of malignant neoplasm of digestive organs: Secondary | ICD-10-CM

## 2020-04-10 DIAGNOSIS — M545 Low back pain, unspecified: Secondary | ICD-10-CM

## 2020-04-10 MED ORDER — METOPROLOL SUCCINATE ER 25 MG PO TB24: 25.0000 mg | ORAL_TABLET | Freq: Every day | ORAL | 3 refills | Status: DC

## 2020-04-10 MED ORDER — HYDROCODONE-ACETAMINOPHEN 5-325 MG PO TABS: 1.0000 | ORAL_TABLET | Freq: Four times a day (QID) | ORAL | 0 refills | Status: DC | PRN

## 2020-04-10 MED ORDER — AMLODIPINE BESYLATE 5 MG PO TABS: 5.0000 mg | ORAL_TABLET | Freq: Every day | ORAL | 3 refills | Status: DC

## 2020-04-10 NOTE — Patient Instructions (Signed)
Come in for fasting labs.   Preventive Care 34-60 Years Old, Female Preventive care refers to visits with your health care provider and lifestyle choices that can promote health and wellness. This includes:  A yearly physical exam. This may also be called an annual well check.  Regular dental visits and eye exams.  Immunizations.  Screening for certain conditions.  Healthy lifestyle choices, such as eating a healthy diet, getting regular exercise, not using drugs or products that contain nicotine and tobacco, and limiting alcohol use. What can I expect for my preventive care visit? Physical exam Your health care provider will check your:  Height and weight. This may be used to calculate body mass index (BMI), which tells if you are at a healthy weight.  Heart rate and blood pressure.  Skin for abnormal spots. Counseling Your health care provider may ask you questions about your:  Alcohol, tobacco, and drug use.  Emotional well-being.  Home and relationship well-being.  Sexual activity.  Eating habits.  Work and work Statistician.  Method of birth control.  Menstrual cycle.  Pregnancy history. What immunizations do I need?  Influenza (flu) vaccine  This is recommended every year. Tetanus, diphtheria, and pertussis (Tdap) vaccine  You may need a Td booster every 10 years. Varicella (chickenpox) vaccine  You may need this if you have not been vaccinated. Zoster (shingles) vaccine  You may need this after age 43. Measles, mumps, and rubella (MMR) vaccine  You may need at least one dose of MMR if you were born in 1957 or later. You may also need a second dose. Pneumococcal conjugate (PCV13) vaccine  You may need this if you have certain conditions and were not previously vaccinated. Pneumococcal polysaccharide (PPSV23) vaccine  You may need one or two doses if you smoke cigarettes or if you have certain conditions. Meningococcal conjugate (MenACWY)  vaccine  You may need this if you have certain conditions. Hepatitis A vaccine  You may need this if you have certain conditions or if you travel or work in places where you may be exposed to hepatitis A. Hepatitis B vaccine  You may need this if you have certain conditions or if you travel or work in places where you may be exposed to hepatitis B. Haemophilus influenzae type b (Hib) vaccine  You may need this if you have certain conditions. Human papillomavirus (HPV) vaccine  If recommended by your health care provider, you may need three doses over 6 months. You may receive vaccines as individual doses or as more than one vaccine together in one shot (combination vaccines). Talk with your health care provider about the risks and benefits of combination vaccines. What tests do I need? Blood tests  Lipid and cholesterol levels. These may be checked every 5 years, or more frequently if you are over 52 years old.  Hepatitis C test.  Hepatitis B test. Screening  Lung cancer screening. You may have this screening every year starting at age 9 if you have a 30-pack-year history of smoking and currently smoke or have quit within the past 15 years.  Colorectal cancer screening. All adults should have this screening starting at age 60 and continuing until age 31. Your health care provider may recommend screening at age 86 if you are at increased risk. You will have tests every 1-10 years, depending on your results and the type of screening test.  Diabetes screening. This is done by checking your blood sugar (glucose) after you have not eaten for  a while (fasting). You may have this done every 1-3 years.  Mammogram. This may be done every 1-2 years. Talk with your health care provider about when you should start having regular mammograms. This may depend on whether you have a family history of breast cancer.  BRCA-related cancer screening. This may be done if you have a family history of  breast, ovarian, tubal, or peritoneal cancers.  Pelvic exam and Pap test. This may be done every 3 years starting at age 58. Starting at age 61, this may be done every 5 years if you have a Pap test in combination with an HPV test. Other tests  Sexually transmitted disease (STD) testing.  Bone density scan. This is done to screen for osteoporosis. You may have this scan if you are at high risk for osteoporosis. Follow these instructions at home: Eating and drinking  Eat a diet that includes fresh fruits and vegetables, whole grains, lean protein, and low-fat dairy.  Take vitamin and mineral supplements as recommended by your health care provider.  Do not drink alcohol if: ? Your health care provider tells you not to drink. ? You are pregnant, may be pregnant, or are planning to become pregnant.  If you drink alcohol: ? Limit how much you have to 0-1 drink a day. ? Be aware of how much alcohol is in your drink. In the [place] one drink equals one 12 oz bottle of beer (355 mL), one 5 oz glass of wine (148 mL), or one 1 oz glass of hard liquor (44 mL). Lifestyle  Take daily care of your teeth and gums.  Stay active. Exercise for at least 30 minutes on 5 or more days each week.  Do not use any products that contain nicotine or tobacco, such as cigarettes, e-cigarettes, and chewing tobacco. If you need help quitting, ask your health care provider.  If you are sexually active, practice safe sex. Use a condom or other form of birth control (contraception) in order to prevent pregnancy and STIs (sexually transmitted infections).  If told by your health care provider, take low-dose aspirin daily starting at age 16. What's next?  Visit your health care provider once a year for a well check visit.  Ask your health care provider how often you should have your eyes and teeth checked.  Stay up to date on all vaccines. This information is not intended to replace advice given to you by your  health care provider. Make sure you discuss any questions you have with your health care provider. Document Revised: 03/18/2018 Document Reviewed: 03/18/2018 Elsevier Patient Education  2020 [employer].

## 2020-04-10 NOTE — Progress Notes (Signed)
Sydney [name] is a 59 y.o. female presents to office today for annual physical exam examination.    Concerns today include: 1.  Hip pain/low back pain Patient has been using her Norco very sparingly.  She was prescribed #10 several months ago and still has 2 tablets left.  Denies any adverse side effects from the medicine.  Has not been taking at all when she takes benzos and the benzo, Klonopin, is almost never used.  She has plans for an MRI on 8 October with her orthopedist.  There was apparently concern for an old fracture.  She is had previous DEXA scanning and this apparently was normal.  Has not had one in several years though.   Marital status: Married, Substance use: No alcohol use Diet: Fair, Exercise: No structured secondary to hip issues Last eye exam: Needs Last dental exam: Needs Last colonoscopy: Needs.  Family history of colon cancer in father.  History of precancerous polyps in 2015, performed at an outside facility.  Needs establish with GI locally Last mammogram: Needs Last pap smear: N/A.  Has a history of total hysterectomy Immunizations needed: Influenza vaccine  Past Medical History:  Diagnosis Date  . Allergy   . Anemia    history of  . Anxiety   . Arthritis   . Asthma   . Atherosclerosis   . Depression   . Family history of adverse reaction to anesthesia    pt's mother felt everything and couldn't speak during a gallbladder surgery  . GERD (gastroesophageal reflux disease)   . History of hiatal hernia   . History of kidney stones   . Hypertension   . Pneumonia    a few times  . TIA (transient ischemic attack) 2012   Social History   Socioeconomic History  . Marital status: Married    Spouse name: Not on file  . Number of children: 3  . Years of education: Not on file  . Highest education level: Not on file  Occupational History  . Not on file  Tobacco Use  . Smoking status: Current Every Day Smoker    Packs/day: 0.50    Years: 23.00     Pack years: 11.50    Types: Cigarettes  . Smokeless tobacco: Never Used  Vaping Use  . Vaping Use: Never used  Substance and Sexual Activity  . Alcohol use: Yes    Comment: occ  . Drug use: Never  . Sexual activity: Not Currently  Other Topics Concern  . Not on file  Social History Narrative   Recently relocated to [place] from [place].  She resides at home with her husband.   She has 3 children but 1 passed away from cancer.   Social Determinants of Health   Financial Resource Strain:   . Difficulty of Paying Living Expenses: Not on file  Food Insecurity:   . Worried About Charity fundraiser in the Last Year: Not on file  . Ran Out of Food in the Last Year: Not on file  Transportation Needs:   . Lack of Transportation (Medical): Not on file  . Lack of Transportation (Non-Medical): Not on file  Physical Activity:   . Days of Exercise per Week: Not on file  . Minutes of Exercise per Session: Not on file  Stress:   . Feeling of Stress : Not on file  Social Connections:   . Frequency of Communication with Friends and Family: Not on file  . Frequency of Social Gatherings  with Friends and Family: Not on file  . Attends Religious Services: Not on file  . Active Member of Clubs or Organizations: Not on file  . Attends Archivist Meetings: Not on file  . Marital Status: Not on file  Intimate Partner Violence:   . Fear of Current or Ex-Partner: Not on file  . Emotionally Abused: Not on file  . Physically Abused: Not on file  . Sexually Abused: Not on file   Past Surgical History:  Procedure Laterality Date  . ABDOMINAL HYSTERECTOMY     abdominal  . APPENDECTOMY    . bladder tack    . CARPAL TUNNEL RELEASE Bilateral   . CERVICAL SPINE SURGERY     5-6, 6-7  . CHOLECYSTECTOMY    . HAMMER TOE SURGERY Right   . JOINT REPLACEMENT    . TOTAL HIP 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   Procedure: LEFT TOTAL HIP ARTHROPLASTY -DIRECT ANTERIOR;  Surgeon: Marybelle Killings, MD;   Location: [facility];  Service: Orthopedics;  Laterality: Left;  . TOTAL HIP ARTHROPLASTY Right 12/05/2019   Procedure: RIGHT TOTAL HIP ARTHROPLASTY ANTERIOR APPROACH  DIRECT ANTERIOR;  Surgeon: Marybelle Killings, MD;  Location: [facility];  Service: Orthopedics;  Laterality: Right;   Family History  Problem Relation Age of Onset  . Anxiety disorder Mother   . Depression Mother   . Heart disease Mother   . Hypertension Mother   . Arrhythmia Mother   . Cancer Father   . Lung cancer Father   . Migraines Sister   . Alcohol abuse Brother   . Cancer Daughter        nerve sheath sarcoma    Current Outpatient Medications:  .  albuterol (PROAIR HFA) 108 (90 Base) MCG/ACT inhaler, Inhale 2 puffs into the lungs every 6 (six) hours as needed for wheezing or shortness of breath., Disp: 18 g, Rfl: 2 .  albuterol (VENTOLIN HFA) 108 (90 Base) MCG/ACT inhaler, Inhale 2 puffs into the lungs every 6 (six) hours as needed for wheezing or shortness of breath., Disp: 18 g, Rfl: 0 .  amLODipine (NORVASC) 5 MG tablet, Take 1 tablet (5 mg total) by mouth daily., Disp: 90 tablet, Rfl: 2 .  calcium carbonate (TUMS - DOSED IN MG ELEMENTAL CALCIUM) 500 MG chewable tablet, Chew 2 tablets by mouth daily as needed for indigestion or heartburn., Disp: , Rfl:  .  clonazePAM (KLONOPIN) 0.5 MG tablet, Take 0.5 mg by mouth daily as needed for anxiety., Disp: , Rfl:  .  diazepam (VALIUM) 5 MG tablet, Take as directed prior to procedure, Disp: 3 tablet, Rfl: 0 .  HYDROcodone-acetaminophen (NORCO) 5-325 MG tablet, Take 1 tablet by mouth every 6 (six) hours as needed for severe pain. (Patient not taking: Reported on 04/05/2020), Disp: 10 tablet, Rfl: 0 .  metoprolol succinate (TOPROL-XL) 25 MG 24 hr tablet, Take 1 tablet (25 mg total) by mouth daily., Disp: 90 tablet, Rfl: 2 .  Pediatric Multivitamins-Iron (FLINTSTONES COMPLETE PO), Take 1 tablet by mouth 3 (three) times a week., Disp: , Rfl:  .  sertraline (ZOLOFT) 100 MG tablet, Take 100 mg  by mouth daily. , Disp: , Rfl:  .  traZODone (DESYREL) 100 MG tablet, Take 200 mg by mouth at bedtime. , Disp: , Rfl:   Allergies  Allergen Reactions  . Bee Pollen Anaphylaxis and Swelling  . Bee Venom Anaphylaxis and Swelling  . Butorphanol Other (See Comments)    Not sure told by md she was allergic  after surgery.   . Meloxicam Anxiety    Mood disorder Altered her personality   . Penicillins Anaphylaxis and Hives    Unable to Recall As child mom was told she is highly allergic Did it involve swelling of the face/tongue/throat, SOB, or low BP? Unknown Did it involve sudden or severe rash/hives, skin peeling, or any reaction on the inside of your mouth or nose? Unknown Did you need to seek medical attention at a hospital or doctor's office? Unknown When did it last happen?childhood allergy If all above answers are "NO", may proceed with cephalosporin use.   Marland Kitchen Prochlorperazine Edisylate Anaphylaxis  . Sulfa Antibiotics Anaphylaxis    Unable to Recall  . Tramadol Anxiety    Didn't like the way it made her feel   . Topiramate Nausea And Vomiting     ROS: Review of Systems Pertinent items noted in HPI and remainder of comprehensive ROS otherwise negative.    Physical exam BP 114/69   Pulse 65   Temp 98 F (36.7 C) (Temporal)   Ht 5' 5"  (1.651 m)   Wt 230 lb (104.3 kg)   SpO2 97%   BMI 38.27 kg/m  General appearance: alert, cooperative, appears stated age, no distress and mildly obese Head: Normocephalic, without obvious abnormality, atraumatic Eyes: negative findings: lids and lashes normal, conjunctivae and sclerae normal, corneas clear and pupils equal, round, reactive to light and accomodation Ears: normal TM's and external ear canals both ears Nose: Nares normal. Septum midline. Mucosa normal. No drainage or sinus tenderness. Throat: lips, mucosa, and tongue normal; teeth and gums normal Neck: no adenopathy, no carotid bruit, supple, symmetrical, trachea  midline and thyroid not enlarged, symmetric, no tenderness/mass/nodules Back: symmetric, no curvature. ROM normal. No CVA tenderness.  Currently ambulating with use of cane Lungs: clear to auscultation bilaterally Heart: regular rate and rhythm, S1, S2 normal, no murmur, click, rub or gallop Abdomen: soft, non-tender; bowel sounds normal; no masses,  no organomegaly Extremities: extremities normal, atraumatic, no cyanosis or edema Pulses: 2+ and symmetric Skin: Skin color, texture, turgor normal. No rashes or lesions or Varicose veins noted throughout lower extremities Lymph nodes: Cervical, supraclavicular, and axillary nodes normal. Neurologic: Alert and oriented X 3, normal strength and tone. Normal symmetric reflexes. Normal coordination and gait Psych: Mood stable, speech normal, affect appropriate   Assessment/ Plan: Amera Banos here for annual physical exam.   1. Annual physical exam  2. Chronic midline low back pain without sciatica Stable with very sparing use of Norco.  New prescription has been sent to put on file.  National narcotic database was reviewed and there were no red flags - HYDROcodone-acetaminophen (NORCO) 5-325 MG tablet; Take 1 tablet by mouth every 6 (six) hours as needed for severe pain. Put on file  Dispense: 10 tablet; Refill: 0  3. Pure hypercholesterolemia She will come in for fasting lipid - CMP14+EGFR; Future - Lipid panel; Future - TSH; Future  4. Essential hypertension Controlled - CMP14+EGFR; Future  5. Anemia, unspecified type Check CBC.  Last hemoglobin noted to be around 9 - CBC; Future  6. Screen for colon cancer She will see gastroenterology locally to establish care.  She has history of precancerous polyps and family history of colon cancer.  She is 1 year overdue for 5-year checkup - Ambulatory referral to Gastroenterology  7. Family history of colon cancer in father - Ambulatory referral to Gastroenterology  8. Screening breast  examination Asymptomatic.  She will go to [facility]  to have breast exam done - MM Digital Screening; Future   Handout on healthy lifestyle choices, including diet (rich in fruits, vegetables and lean meats and low in salt and simple carbohydrates) and exercise (at least 30 minutes of moderate physical activity daily).  Patient to follow up in 1 year for annual exam or sooner if needed.  Travers Goodley M. Lajuana Ripple, DO

## 2020-04-11 ENCOUNTER — Encounter: Payer: Self-pay | Admitting: Internal Medicine

## 2020-04-18 ENCOUNTER — Encounter: Payer: Self-pay | Admitting: Family Medicine

## 2020-04-18 ENCOUNTER — Other Ambulatory Visit: Payer: Self-pay | Admitting: Family Medicine

## 2020-04-18 DIAGNOSIS — M545 Low back pain, unspecified: Secondary | ICD-10-CM

## 2020-04-18 DIAGNOSIS — G8929 Other chronic pain: Secondary | ICD-10-CM

## 2020-04-27 ENCOUNTER — Other Ambulatory Visit: Payer: Self-pay

## 2020-04-27 ENCOUNTER — Ambulatory Visit (HOSPITAL_COMMUNITY)
Admission: RE | Admit: 2020-04-27 | Discharge: 2020-04-27 | Disposition: A | Discharge status: Home / Self Care | Payer: No Typology Code available for payment source | Source: Ambulatory Visit | Attending: Orthopaedic Surgery | Admitting: Orthopaedic Surgery

## 2020-04-27 DIAGNOSIS — G8929 Other chronic pain: Secondary | ICD-10-CM | POA: Insufficient documentation

## 2020-04-27 DIAGNOSIS — M545 Low back pain, unspecified: Secondary | ICD-10-CM | POA: Diagnosis present

## 2020-04-27 DIAGNOSIS — M5416 Radiculopathy, lumbar region: Secondary | ICD-10-CM | POA: Insufficient documentation

## 2020-05-03 ENCOUNTER — Ambulatory Visit (INDEPENDENT_AMBULATORY_CARE_PROVIDER_SITE_OTHER): Payer: No Typology Code available for payment source | Admitting: Orthopaedic Surgery

## 2020-05-03 ENCOUNTER — Encounter: Payer: Self-pay | Admitting: Orthopaedic Surgery

## 2020-05-03 VITALS — Ht 65.0 in | Wt 230.0 lb

## 2020-05-03 DIAGNOSIS — M4156 Other secondary scoliosis, lumbar region: Secondary | ICD-10-CM

## 2020-05-03 DIAGNOSIS — M48062 Spinal stenosis, lumbar region with neurogenic claudication: Secondary | ICD-10-CM

## 2020-05-03 DIAGNOSIS — M48061 Spinal stenosis, lumbar region without neurogenic claudication: Secondary | ICD-10-CM | POA: Insufficient documentation

## 2020-05-03 DIAGNOSIS — M5137 Other intervertebral disc degeneration, lumbosacral region: Secondary | ICD-10-CM | POA: Diagnosis not present

## 2020-05-03 DIAGNOSIS — M7541 Impingement syndrome of right shoulder: Secondary | ICD-10-CM | POA: Insufficient documentation

## 2020-05-03 NOTE — Progress Notes (Signed)
Office Visit Note   Patient: Sydney [name]           Date of Birth: Aug 08, 1960           MRN: [id number] Visit Date: 05/03/2020              Requested by: Raliegh Ip, DO [address] [place],  [place] [address] PCP: Raliegh Ip, DO   Assessment & Plan: Visit Diagnoses:  1. DDD (degenerative disc disease), lumbosacral   2. Spinal stenosis of lumbar region with neurogenic claudication   3. Impingement syndrome of right shoulder   4. Other secondary scoliosis, lumbar region     Plan: MRI scan is reviewed she has some scoliosis multilevel degenerative changes multiple levels with mild narrowing.  Some lateral recess narrowing at L4-5 on the right and some central early narrowing at L3-4.  Injection performed in her right shoulder for impingement which she tolerated well.  She can follow-up with me if she has persistent symptoms.  Follow-Up Instructions: No follow-ups on file.   Orders:  No orders of the defined types were placed in this encounter.  No orders of the defined types were placed in this encounter.     Procedures: Large Joint Inj: R subacromial bursa on 05/04/2020 10:32 AM Indications: pain Details: 22 G 1.5 in needle  Arthrogram: No  Medications: 4 mL bupivacaine 0.25 %; 40 mg methylPREDNISolone acetate 40 MG/ML; 0.5 mL lidocaine 1 % Outcome: tolerated well, no immediate complications Procedure, treatment alternatives, risks and benefits explained, specific risks discussed. Consent was given by the patient. Immediately prior to procedure a time out was called to verify the correct patient, procedure, equipment, support staff and site/side marked as required. Patient was prepped and draped in the usual sterile fashion.       Clinical Data: No additional findings.   Subjective: Chief Complaint  Patient presents with  . Lower Back - Pain, Follow-up    MRI lumbar review  . Right Shoulder - Pain    HPI 59 year old female returns with ongoing  problems with right shoulder.  She is here for MRI review states her pain when sitting is a 3 when she is active her pain increases to 8 or 9 she also has some chronic low back pain.  Previous hip arthroplasties in February on the left and may on the right of 2021 are both doing well.  Lumbar MRI scan 04/27/2020 is reviewed today and she is requesting an injection in her right shoulder which previously gave her excellent relief for many months.  Review of Systems 14 point system update unchanged from 04/05/2020 office visit.   Objective: Vital Signs: Ht 5\' 5"  (1.651 m)   Wt 230 lb (104.3 kg)   BMI 38.27 kg/m   Physical Exam Constitutional:      Appearance: She is well-developed.  HENT:     Head: Normocephalic.     Right Ear: External ear normal.     Left Ear: External ear normal.  Eyes:     Pupils: Pupils are equal, round, and reactive to light.  Neck:     Thyroid: No thyromegaly.     Trachea: No tracheal deviation.  Cardiovascular:     Rate and Rhythm: Normal rate.  Pulmonary:     Effort: Pulmonary effort is normal.  Abdominal:     Palpations: Abdomen is soft.  Skin:    General: Skin is warm and dry.  Neurological:     Mental Status: She is  alert and oriented to person, place, and time.  Psychiatric:        Behavior: Behavior normal.     Ortho Exam patient is amatory without hip limp.  Good range of motion of both hips without discomfort.  Positive impingement right shoulder.  Upper extremity reflexes are 2+.  Minimal brachial plexus tenderness right and left.  No increased pain with cervical compression no change with distraction.  Long head of biceps minimally tender negative drop arm test. Specialty Comments:  No specialty comments available.  Imaging: CLINICAL DATA:  Chronic low back pain with right leg pain and numbness.  EXAM: MRI LUMBAR SPINE WITHOUT CONTRAST  TECHNIQUE: Multiplanar, multisequence MR imaging of the lumbar spine was performed. No intravenous  contrast was administered.  COMPARISON:  06/07/2014  FINDINGS: Segmentation: There are five lumbar type vertebral bodies. The last full intervertebral disc space is labeled L5-S1. This correlates with the prior study.  Alignment: Significant scoliotic curvature but normal overall alignment in the sagittal plane. Mild degenerative anterolisthesis of L5 is again demonstrated.  Vertebrae: Endplate reactive changes but no fractures or bone lesions. 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  Conus medullaris and cauda equina: Conus extends to the T12-L1 level. Conus and cauda equina appear normal.  Paraspinal and other soft tissues: No significant paraspinal or retroperitoneal findings.  Disc levels:  L1-2: Asymmetric left-sided facet disease with thickening and buckling of the ligamentum flavum. There is mild mass effect on the left side of the thecal sac. There is also mild to moderate left lateral recess stenosis. No significant spinal or foraminal stenosis.  L2-3: Bulging annulus, osteophytic ridging and facet disease asymmetric on the left side. No significant spinal stenosis. Mild left lateral recess and left foraminal stenosis.  L3-4: Bulging annulus, osteophytic ridging and advanced facet disease contributing to early spinal stenosis and mild bilateral lateral recess stenosis. No significant foraminal stenosis. Mild left foraminal encroachment.  L4-5: Advanced facet disease, right greater than left in conjunction with a bulging annulus contributing to right lateral recess stenosis and mild right foraminal stenosis. No significant spinal or left foraminal stenosis.  L5-S1: Severe disc disease and facet disease but no focal disc protrusion or significant spinal or foraminal stenosis.  IMPRESSION: 1. Scoliosis and degenerative lumbar spondylosis with multilevel disc disease and facet  disease. 2. Mild to moderate left lateral recess stenosis at L1-2. 3. Mild left lateral recess and left foraminal stenosis at L2-3. 4. Early spinal stenosis and mild bilateral lateral recess stenosis at L3-4. There is also mild left foraminal encroachment. 5. Right lateral recess stenosis and mild right foraminal stenosis at L4-5.   Electronically Signed   By: Rudie Meyer M.D.   On: 04/27/2020 15:01    PMFS History: Patient Active Problem List   Diagnosis Date Noted  . Spinal stenosis of lumbar region 05/03/2020  . Impingement syndrome of right shoulder 05/03/2020  . Other secondary scoliosis, lumbar region 03/01/2020  . H/O total hip arthroplasty 12/06/2019  . Arthritis of right hip 12/05/2019  . Unilateral primary osteoarthritis, right hip 11/17/2019  . Status post total replacement of left hip 09/15/2019  . Back pain 07/01/2018  . DDD (degenerative disc disease), lumbosacral 07/01/2018  . Venous insufficiency of both lower extremities 11/17/2016  . 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 Morbid obesity with BMI of 40.0-44.9, adult (HCC) 07/28/2016  . Chronic pain syndrome 10/24/2015  . Dysfunction of both eustachian tubes 07/26/2014  .  Calculus of left kidney 04/25/2014  . Impaired fasting glucose 04/05/2014  . History of bladder surgery 04/03/2014  . History of hysterectomy 04/03/2014  . Urge incontinence of urine 04/03/2014  . Asthma 07/28/2013  . Nontoxic uninodular goiter 07/28/2013  . Solitary pulmonary nodule 07/28/2013  . Lumbar radiculopathy 04/20/2013  . Insomnia 03/22/2013  . Essential hypertension 08/18/2011  . Tobacco use disorder 08/18/2011  . Moderate episode of recurrent major depressive disorder (HCC) 04/03/2011  . Other B-complex deficiencies 08/16/2010  . Family history of cerebrovascular accident (CVA) 04/17/2010  . Family history of malignant neoplasm of gastrointestinal tract 04/17/2010  . Family history of  other cardiovascular diseases(V17.49) 04/17/2010   Past Medical History:  Diagnosis Date  . Allergy   . Anemia    history of  . Anxiety   . Arthritis   . Asthma   . Atherosclerosis   . Depression   . Family history of adverse reaction to anesthesia    pt's mother felt everything and couldn't speak during a gallbladder surgery  . GERD (gastroesophageal reflux disease)   . History of hiatal hernia   . History of kidney stones   . Hypertension   . Pneumonia    a few times  . TIA (transient ischemic attack) 2012    Family History  Problem Relation Age of Onset  . Anxiety disorder Mother   . Depression Mother   . Heart disease Mother   . Hypertension Mother   . Arrhythmia Mother   . Cancer Father   . Lung cancer Father   . Migraines Sister   . Alcohol abuse Brother   . Cancer Daughter        nerve sheath sarcoma    Past Surgical History:  Procedure Laterality Date  . ABDOMINAL HYSTERECTOMY     abdominal  . APPENDECTOMY    . bladder tack    . CARPAL TUNNEL RELEASE Bilateral   . CERVICAL SPINE SURGERY     5-6, 6-7  . CHOLECYSTECTOMY    . HAMMER TOE SURGERY Right   . JOINT REPLACEMENT    . TOTAL HIP 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   Procedure: LEFT TOTAL HIP ARTHROPLASTY -DIRECT ANTERIOR;  Surgeon: Eldred Manges, MD;  Location: [facility];  Service: Orthopedics;  Laterality: Left;  . TOTAL HIP ARTHROPLASTY Right 12/05/2019   Procedure: RIGHT TOTAL HIP ARTHROPLASTY ANTERIOR APPROACH  DIRECT ANTERIOR;  Surgeon: Eldred Manges, MD;  Location: [facility];  Service: Orthopedics;  Laterality: Right;   Social History   Occupational History  . Not on file  Tobacco Use  . Smoking status: Current Every Day Smoker    Packs/day: 0.50    Years: 23.00    Pack years: 11.50    Types: Cigarettes  . Smokeless tobacco: Never Used  Vaping Use  . Vaping Use: Never used  Substance and Sexual Activity  . Alcohol use: Yes    Comment: occ  . Drug use: Never  . Sexual activity: Not Currently

## 2020-05-04 DIAGNOSIS — M7541 Impingement syndrome of right shoulder: Secondary | ICD-10-CM | POA: Diagnosis not present

## 2020-05-04 MED ORDER — BUPIVACAINE HCL 0.25 % IJ SOLN
4.0000 mL | INTRAMUSCULAR | Status: AC | PRN
  Administered 2020-05-04: 4 mL via INTRA_ARTICULAR

## 2020-05-04 MED ORDER — LIDOCAINE HCL 1 % IJ SOLN
0.5000 mL | INTRAMUSCULAR | Status: AC | PRN
  Administered 2020-05-04: .5 mL

## 2020-05-04 MED ORDER — METHYLPREDNISOLONE ACETATE 40 MG/ML IJ SUSP
40.0000 mg | INTRAMUSCULAR | Status: AC | PRN
  Administered 2020-05-04: 40 mg via INTRA_ARTICULAR

## 2020-05-23 ENCOUNTER — Other Ambulatory Visit: Payer: Self-pay | Admitting: *Deleted

## 2020-05-23 ENCOUNTER — Encounter: Payer: Self-pay | Admitting: *Deleted

## 2020-05-23 ENCOUNTER — Other Ambulatory Visit: Payer: Self-pay

## 2020-05-23 ENCOUNTER — Encounter: Payer: Self-pay | Admitting: Gastroenterology

## 2020-05-23 ENCOUNTER — Ambulatory Visit (INDEPENDENT_AMBULATORY_CARE_PROVIDER_SITE_OTHER): Payer: No Typology Code available for payment source | Admitting: Gastroenterology

## 2020-05-23 DIAGNOSIS — Z800 Family history of malignant neoplasm of digestive organs: Secondary | ICD-10-CM | POA: Diagnosis not present

## 2020-05-23 DIAGNOSIS — Z86010 Personal history of colonic polyps: Secondary | ICD-10-CM | POA: Diagnosis not present

## 2020-05-23 MED ORDER — CLENPIQ 10-3.5-12 MG-GM -GM/160ML PO SOLN: 1.0000 | Freq: Once | ORAL | 0 refills | Status: AC

## 2020-05-23 NOTE — Progress Notes (Signed)
Primary Care Physician:  Raliegh Ip, DO  Primary Gastroenterologist:    Chief Complaint  Patient presents with  . Consult    TCS last done 5-6 years ago    HPI:  Sydney [name] is a 59 y.o. female here to schedule surveillance colonoscopy.  Last colonoscopy December 2015 at [facility].  Polypectomy performed.  Pathology unavailable. Patient states she was advised to come back in five years.  Father had colon cancer age greater than 84.  BM normally few loose stools after coffee in the morning if doesn't eat with her coffee. No melena, brbpr. Few days of cramping, kind of like period cramps. Stress at home with son and grandchild living with her.  Improved at this point.  No n/v. No heartburn. No dysphagia.   With prior colonoscopy she has had problems with vomiting with golytely, after drinks about 1/4 of it. Tolerates mag citrate. H/o not being adequately sedated.  Anemia postoperatively after hip replacement back in May and Feb of this year.  Normal hemoglobin prior to surgery.   Current Outpatient Medications  Medication Sig Dispense Refill  . albuterol (VENTOLIN HFA) 108 (90 Base) MCG/ACT inhaler Inhale into the lungs every 6 (six) hours as needed for wheezing or shortness of breath.    Marland Kitchen amLODipine (NORVASC) 5 MG tablet Take 1 tablet (5 mg total) by mouth daily. 90 tablet 3  . calcium carbonate (TUMS - DOSED IN MG ELEMENTAL CALCIUM) 500 MG chewable tablet Chew 2 tablets by mouth daily as needed for indigestion or heartburn.    . clonazePAM (KLONOPIN) 0.5 MG tablet Take 0.5 mg by mouth daily as needed for anxiety.    Marland Kitchen HYDROcodone-acetaminophen (NORCO) 5-325 MG tablet Take 1 tablet by mouth every 6 (six) hours as needed for severe pain. Put on file 10 tablet 0  . ibuprofen (ADVIL) 200 MG tablet Take 600 mg by mouth daily.    . metoprolol succinate (TOPROL-XL) 25 MG 24 hr tablet Take 1 tablet (25 mg total) by mouth daily. 90 tablet 3  . sertraline (ZOLOFT) 100 MG tablet  Take 100 mg by mouth daily.     . traZODone (DESYREL) 100 MG tablet Take 200 mg by mouth at bedtime.      No current facility-administered medications for this visit.    Allergies as of 05/23/2020 - Review Complete 05/23/2020  Allergen Reaction Noted  . Bee pollen Anaphylaxis and Swelling 04/27/2014  . Bee venom Anaphylaxis and Swelling 08/18/2019  . Butorphanol Other (See Comments) 03/22/2013  . Meloxicam Anxiety 04/20/2013  . Penicillins Anaphylaxis and Hives 03/22/2013  . Prochlorperazine edisylate Anaphylaxis 03/22/2013  . Sulfa antibiotics Anaphylaxis 04/18/2013  . Tramadol Anxiety 04/18/2013  . Topiramate Nausea And Vomiting 10/24/2015    Past Medical History:  Diagnosis Date  . Allergy   . Anemia    history of  . Anxiety   . Arthritis   . Asthma   . Atherosclerosis   . Depression   . Family history of adverse reaction to anesthesia    pt's mother felt everything and couldn't speak during a gallbladder surgery  . GERD (gastroesophageal reflux disease)   . History of hiatal hernia   . History of kidney stones   . Hypertension   . Pneumonia    a few times  . TIA (transient ischemic attack) 2012    Past Surgical History:  Procedure Laterality Date  . ABDOMINAL HYSTERECTOMY     abdominal  . APPENDECTOMY    . bladder  tack    . CARPAL TUNNEL RELEASE Bilateral   . CERVICAL SPINE SURGERY     5-6, 6-7  . CHOLECYSTECTOMY    . HAMMER TOE SURGERY Right   . JOINT REPLACEMENT    . TOTAL HIP 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   Procedure: LEFT TOTAL HIP ARTHROPLASTY -DIRECT ANTERIOR;  Surgeon: Eldred Manges, MD;  [facility]: [facility];  Service: Orthopedics;  Laterality: Left;  . TOTAL HIP ARTHROPLASTY Right 12/05/2019   Procedure: RIGHT TOTAL HIP ARTHROPLASTY ANTERIOR APPROACH  DIRECT ANTERIOR;  Surgeon: Eldred Manges, MD;  Location: [facility] OR;  Service: Orthopedics;  Laterality: Right;    Family History  Problem Relation Age of Onset  . Anxiety disorder Mother   . Depression  Mother   . Heart disease Mother   . Hypertension Mother   . Arrhythmia Mother   . Cancer Father        colon, age greater than 23  . Lung cancer Father   . Migraines Sister   . Alcohol abuse Brother   . Cancer Daughter        nerve sheath sarcoma    Social History   Socioeconomic History  . Marital status: Married    Spouse name: Not on file  . Number of children: 3  . Years of education: Not on file  . Highest education level: Not on file  Occupational History  . Not on file  Tobacco Use  . Smoking status: Current Every Day Smoker    Packs/day: 0.50    Years: 23.00    Pack years: 11.50    Types: Cigarettes  . Smokeless tobacco: Never Used  Vaping Use  . Vaping Use: Never used  Substance and Sexual Activity  . Alcohol use: Yes    Comment: occ  . Drug use: Never  . Sexual activity: Not Currently  Other Topics Concern  . Not on file  Social History Narrative   Recently relocated to [place] from [place].  She resides at home with her husband.   She has 3 children but 1 passed away from cancer.   Social Determinants of Health   Financial Resource Strain:   . Difficulty of Paying Living Expenses: Not on file  Food Insecurity:   . Worried About Programme researcher, broadcasting/film/video in the Last Year: Not on file  . Ran Out of Food in the Last Year: Not on file  Transportation Needs:   . Lack of Transportation (Medical): Not on file  . Lack of Transportation (Non-Medical): Not on file  Physical Activity:   . Days of Exercise per Week: Not on file  . Minutes of Exercise per Session: Not on file  Stress:   . Feeling of Stress : Not on file  Social Connections:   . Frequency of Communication with Friends and Family: Not on file  . Frequency of Social Gatherings with Friends and Family: Not on file  . Attends Religious Services: Not on file  . Active Member of Clubs or Organizations: Not on file  . Attends Banker Meetings: Not on file  . Marital Status: Not on  file  Intimate Partner Violence:   . Fear of Current or Ex-Partner: Not on file  . Emotionally Abused: Not on file  . Physically Abused: Not on file  . Sexually Abused: Not on file      ROS:  General: Negative for anorexia, weight loss, fever, chills, fatigue, weakness. Eyes: Negative for vision changes.  ENT: Negative for hoarseness,  difficulty swallowing , nasal congestion. CV: Negative for chest pain, angina, palpitations, dyspnea on exertion, peripheral edema.  Respiratory: Negative for dyspnea at rest, dyspnea on exertion, cough, sputum, wheezing.  GI: See history of present illness. GU:  Negative for dysuria, hematuria, urinary incontinence, urinary frequency, nocturnal urination.  MS: Negative for joint pain, low back pain.  Derm: Negative for rash or itching.  Neuro: Negative for weakness, abnormal sensation, seizure, frequent headaches, memory loss, confusion.  Psych: Negative for depression, suicidal ideation, hallucinations.  Positive anxiety  Endo: Negative for unusual weight change.  Heme: Negative for bruising or bleeding. Allergy: Negative for rash or hives.    Physical Examination:  BP 129/80   Pulse 65   Temp 97.8 F (36.6 C)   Ht 5\' 5"  (1.651 m)   Wt 232 lb (105.2 kg)   BMI 38.61 kg/m    General: Well-nourished, well-developed in no acute distress.  Head: Normocephalic, atraumatic.   Eyes: Conjunctiva pink, no icterus. Mouth: masked Neck: Supple without thyromegaly, masses, or lymphadenopathy.  Lungs: Clear to auscultation bilaterally.  Heart: Regular rate and rhythm, no murmurs rubs or gallops.  Abdomen: Bowel sounds are normal, nontender, nondistended, no hepatosplenomegaly or masses, no abdominal bruits or    hernia , no rebound or guarding.   Rectal: not performed Extremities: No lower extremity edema. No clubbing or deformities.  Neuro: Alert and oriented x 4 , grossly normal neurologically.  Skin: Warm and dry, no rash or jaundice.   Psych:  Alert and cooperative, normal mood and affect.  Labs: Lab Results  Component Value Date   CREATININE 0.56 12/06/2019   BUN 7 12/06/2019   NA 137 12/06/2019   K 4.9 12/06/2019   CL 104 12/06/2019   CO2 26 12/06/2019   Lab Results  Component Value Date   WBC 6.5 12/07/2019   HGB 9.7 (L) 12/07/2019   HCT 31.0 (L) 12/07/2019   MCV 93.4 12/07/2019   PLT 185 12/07/2019   Lab Results  Component Value Date   ALT 14 12/02/2019   AST 15 12/02/2019   ALKPHOS 88 12/02/2019   BILITOT 0.6 12/02/2019   Lab Results  Component Value Date   TSH 1.070 10/01/2018     Imaging Studies: MR Lumbar Spine w/o contrast  Result Date: 04/27/2020 CLINICAL DATA:  Chronic low back pain with right leg pain and numbness. EXAM: MRI LUMBAR SPINE WITHOUT CONTRAST TECHNIQUE: Multiplanar, multisequence MR imaging of the lumbar spine was performed. No intravenous contrast was administered. COMPARISON:  06/07/2014 FINDINGS: Segmentation: There are five lumbar type vertebral bodies. The last full intervertebral disc space is labeled L5-S1. This correlates with the prior study. Alignment: Significant scoliotic curvature but normal overall alignment in the sagittal plane. Mild degenerative anterolisthesis of L5 is again demonstrated. Vertebrae: Endplate reactive changes but no fractures or bone lesions. 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 Conus medullaris and cauda equina: Conus extends to the T12-L1 level. Conus and cauda equina appear normal. Paraspinal and other soft tissues: No significant paraspinal or retroperitoneal findings. Disc levels: L1-2: Asymmetric left-sided facet disease with thickening and buckling of the ligamentum flavum. There is mild mass effect on the left side of the thecal sac. There is also mild to moderate left lateral recess stenosis. No significant spinal or foraminal stenosis. L2-3: Bulging annulus, osteophytic ridging and  facet disease asymmetric on the left side. No significant spinal stenosis. Mild left lateral recess and left foraminal stenosis. L3-4: Bulging annulus,  osteophytic ridging and advanced facet disease contributing to early spinal stenosis and mild bilateral lateral recess stenosis. No significant foraminal stenosis. Mild left foraminal encroachment. L4-5: Advanced facet disease, right greater than left in conjunction with a bulging annulus contributing to right lateral recess stenosis and mild right foraminal stenosis. No significant spinal or left foraminal stenosis. L5-S1: Severe disc disease and facet disease but no focal disc protrusion or significant spinal or foraminal stenosis. IMPRESSION: 1. Scoliosis and degenerative lumbar spondylosis with multilevel disc disease and facet disease. 2. Mild to moderate left lateral recess stenosis at L1-2. 3. Mild left lateral recess and left foraminal stenosis at L2-3. 4. Early spinal stenosis and mild bilateral lateral recess stenosis at L3-4. There is also mild left foraminal encroachment. 5. Right lateral recess stenosis and mild right foraminal stenosis at L4-5. Electronically Signed   By: Rudie MeyerP.  Gallerani M.D.   On: 04/27/2020 15:01    Impression/plan:  59 year old female with history of colon polyps, family history of colon cancer (father age greater than 5260) presenting for 5-year surveillance colonoscopy.  Last colonoscopy was in 2015.  Patient reports failing conscious sedation in the past.  We will plan on deep sedation with Dr. Marletta Lorarver. ASA III.  I have discussed the risks, alternatives, benefits with regards to but not limited to the risk of reaction to medication, bleeding, infection, perforation and the patient is agreeable to proceed. Written consent to be obtained.

## 2020-05-23 NOTE — Patient Instructions (Signed)
1. Colonoscopy as scheduled. See separate instructions.  

## 2020-05-25 ENCOUNTER — Emergency Department (HOSPITAL_COMMUNITY)
Admission: EM | Admit: 2020-05-25 | Discharge: 2020-05-25 | Disposition: A | Discharge status: Home / Self Care | Payer: No Typology Code available for payment source | Attending: Emergency Medicine | Admitting: Emergency Medicine

## 2020-05-25 ENCOUNTER — Other Ambulatory Visit: Payer: Self-pay

## 2020-05-25 ENCOUNTER — Telehealth: Payer: No Typology Code available for payment source | Admitting: Physician Assistant

## 2020-05-25 ENCOUNTER — Encounter (HOSPITAL_COMMUNITY): Payer: Self-pay | Admitting: *Deleted

## 2020-05-25 ENCOUNTER — Ambulatory Visit
Admission: EM | Admit: 2020-05-25 | Discharge: 2020-05-25 | Disposition: A | Discharge status: Home / Self Care | Payer: No Typology Code available for payment source

## 2020-05-25 DIAGNOSIS — I10 Essential (primary) hypertension: Secondary | ICD-10-CM | POA: Diagnosis not present

## 2020-05-25 DIAGNOSIS — R102 Pelvic and perineal pain: Secondary | ICD-10-CM | POA: Diagnosis present

## 2020-05-25 DIAGNOSIS — Z79899 Other long term (current) drug therapy: Secondary | ICD-10-CM | POA: Diagnosis not present

## 2020-05-25 DIAGNOSIS — K219 Gastro-esophageal reflux disease without esophagitis: Secondary | ICD-10-CM | POA: Insufficient documentation

## 2020-05-25 DIAGNOSIS — Z96641 Presence of right artificial hip joint: Secondary | ICD-10-CM | POA: Insufficient documentation

## 2020-05-25 DIAGNOSIS — Z87442 Personal history of urinary calculi: Secondary | ICD-10-CM | POA: Diagnosis not present

## 2020-05-25 DIAGNOSIS — Z96642 Presence of left artificial hip joint: Secondary | ICD-10-CM | POA: Insufficient documentation

## 2020-05-25 DIAGNOSIS — F17210 Nicotine dependence, cigarettes, uncomplicated: Secondary | ICD-10-CM | POA: Diagnosis not present

## 2020-05-25 DIAGNOSIS — J45909 Unspecified asthma, uncomplicated: Secondary | ICD-10-CM | POA: Insufficient documentation

## 2020-05-25 MED ORDER — LIDOCAINE 5 % EX OINT
1.0000 | TOPICAL_OINTMENT | Freq: Four times a day (QID) | CUTANEOUS | 0 refills | Status: DC | PRN
Start: 2020-05-25 — End: 2020-07-10

## 2020-05-25 NOTE — ED Provider Notes (Signed)
[facility] EMERGENCY DEPARTMENT Provider Note   CSN: [id number] Arrival date & time: 05/25/20  1726     History Chief Complaint  Patient presents with  . Abscess    Sydney [name] is a 59 y.o. female.  Who presents emergency department chief complaint of vaginal pain.  She had onset of pain 1 week ago.  She describes the pain as throbbing, at times severe.  She feels pain on the left vaginal wall.  Patient states that she felt inside and thought she felt a lump or cyst of some kind.  She went to an urgent care earlier today however was sent here for further evaluation.  She denies urinary symptoms, fever or chills.  She is status post total hysterectomy.        Past Medical History:  Diagnosis Date  . Allergy   . Anemia    history of  . Anxiety   . Arthritis   . Asthma   . Atherosclerosis   . Depression   . Family history of adverse reaction to anesthesia    pt's mother felt everything and couldn't speak during a gallbladder surgery  . GERD (gastroesophageal reflux disease)   . History of hiatal hernia   . History of kidney stones   . Hypertension   . Pneumonia    a few times  . TIA (transient ischemic attack) 2012    Patient Active Problem List   Diagnosis Date Noted  . H/O adenomatous polyp of colon 05/23/2020  . FH: colon cancer in relative diagnosed at >92 years old 05/23/2020  . Spinal stenosis of lumbar region 05/03/2020  . Impingement syndrome of right shoulder 05/03/2020  . Other secondary scoliosis, lumbar region 03/01/2020  . H/O total hip arthroplasty 12/06/2019  . Arthritis of right hip 12/05/2019  . Unilateral primary osteoarthritis, right hip 11/17/2019  . Status post total replacement of left hip 09/15/2019  . Back pain 07/01/2018  . DDD (degenerative disc disease), lumbosacral 07/01/2018  . Venous insufficiency of both lower extremities 11/17/2016  . 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 Morbid obesity with BMI  of 40.0-44.9, adult (HCC) 07/28/2016  . Chronic pain syndrome 10/24/2015  . Dysfunction of both eustachian tubes 07/26/2014  . Calculus of left kidney 04/25/2014  . Impaired fasting glucose 04/05/2014  . History of bladder surgery 04/03/2014  . History of hysterectomy 04/03/2014  . Urge incontinence of urine 04/03/2014  . Asthma 07/28/2013  . Nontoxic uninodular goiter 07/28/2013  . Solitary pulmonary nodule 07/28/2013  . Lumbar radiculopathy 04/20/2013  . Insomnia 03/22/2013  . Essential hypertension 08/18/2011  . Tobacco use disorder 08/18/2011  . Moderate episode of recurrent major depressive disorder (HCC) 04/03/2011  . Other B-complex deficiencies 08/16/2010  . Family history of cerebrovascular accident (CVA) 04/17/2010  . Family history of malignant neoplasm of gastrointestinal tract 04/17/2010  . Family history of other cardiovascular diseases(V17.49) 04/17/2010    Past Surgical History:  Procedure Laterality Date  . ABDOMINAL HYSTERECTOMY     abdominal  . APPENDECTOMY    . bladder tack    . CARPAL TUNNEL RELEASE Bilateral   . CERVICAL SPINE SURGERY     5-6, 6-7  . CHOLECYSTECTOMY    . HAMMER TOE SURGERY Right   . JOINT REPLACEMENT    . TOTAL HIP 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   Procedure: LEFT TOTAL HIP ARTHROPLASTY -DIRECT ANTERIOR;  Surgeon: Eldred Manges, MD;  Location: [facility] OR;  Service: Orthopedics;  Laterality: Left;  .  TOTAL HIP ARTHROPLASTY Right 12/05/2019   Procedure: RIGHT TOTAL HIP ARTHROPLASTY ANTERIOR APPROACH  DIRECT ANTERIOR;  Surgeon: Eldred Manges, MD;  Location: [facility] OR;  Service: Orthopedics;  Laterality: Right;     OB History   No obstetric history on file.     Family History  Problem Relation Age of Onset  . Anxiety disorder Mother   . Depression Mother   . Heart disease Mother   . Hypertension Mother   . Arrhythmia Mother   . Cancer Father        colon, age greater than 64  . Lung cancer Father   . Migraines Sister   . Alcohol abuse  Brother   . Cancer Daughter        nerve sheath sarcoma    Social History   Tobacco Use  . Smoking status: Current Every Day Smoker    Packs/day: 0.50    Years: 23.00    Pack years: 11.50    Types: Cigarettes  . Smokeless tobacco: Never Used  Vaping Use  . Vaping Use: Never used  Substance Use Topics  . Alcohol use: Yes    Comment: occ  . Drug use: Never    Home Medications Prior to Admission medications   Medication Sig Start Date End Date Taking? Authorizing Provider  albuterol (VENTOLIN HFA) 108 (90 Base) MCG/ACT inhaler Inhale into the lungs every 6 (six) hours as needed for wheezing or shortness of breath.    [provider]  amLODipine (NORVASC) 5 MG tablet Take 1 tablet (5 mg total) by mouth daily. 04/10/20   Raliegh Ip, DO  calcium carbonate (TUMS - DOSED IN MG ELEMENTAL CALCIUM) 500 MG chewable tablet Chew 2 tablets by mouth daily as needed for indigestion or heartburn.    [provider]  clonazePAM (KLONOPIN) 0.5 MG tablet Take 0.5 mg by mouth daily as needed for anxiety.    [provider]  HYDROcodone-acetaminophen (NORCO) 5-325 MG tablet Take 1 tablet by mouth every 6 (six) hours as needed for severe pain. Put on file 04/10/20   Raliegh Ip, DO  ibuprofen (ADVIL) 200 MG tablet Take 600 mg by mouth daily.    [provider]  lidocaine (XYLOCAINE) 5 % ointment Apply 1 application topically 4 (four) times daily as needed. 05/25/20   Arthor Captain, PA-C  metoprolol succinate (TOPROL-XL) 25 MG 24 hr tablet Take 1 tablet (25 mg total) by mouth daily. 04/10/20   Raliegh Ip, DO  sertraline (ZOLOFT) 100 MG tablet Take 100 mg by mouth daily.  11/21/13   [provider]  traZODone (DESYREL) 100 MG tablet Take 200 mg by mouth at bedtime.  07/28/13   [provider]    Allergies    Bee pollen, Bee venom, Butorphanol, Meloxicam, Penicillins, Prochlorperazine edisylate, Sulfa antibiotics, Tramadol, and  Topiramate  Review of Systems   Review of Systems Ten systems reviewed and are negative for acute change, except as noted in the HPI.  McKay physical Exam Updated Vital Signs BP 139/90 (BP Location: Right Arm)   Pulse 70   Temp 98 F (36.7 C) (Oral)   Resp 18   Ht 5\' 5"  (1.651 m)   SpO2 96%   BMI 38.61 kg/m   Physical Exam Vitals and nursing note reviewed.  Constitutional:      General: She is not in acute distress.    Appearance: She is well-developed. She is not diaphoretic.  HENT:     Head: Normocephalic and  atraumatic.  Eyes:     General: No scleral icterus.    Conjunctiva/sclera: Conjunctivae normal.  Cardiovascular:     Rate and Rhythm: Normal rate and regular rhythm.     Heart sounds: Normal heart sounds. No murmur heard.  No friction rub. No gallop.   Pulmonary:     Effort: Pulmonary effort is normal. No respiratory distress.     Breath sounds: Normal breath sounds.  Abdominal:     General: Bowel sounds are normal. There is no distension.     Palpations: Abdomen is soft. There is no mass.     Tenderness: There is no abdominal tenderness. There is no guarding.  Genitourinary:    General: Normal vulva.     Exam position: Supine.     Vagina: Normal. No vaginal discharge, lesions or prolapsed vaginal walls.     Comments: Patient with normal external female genitalia.  Vaginal walls are pink and healthy.  No excessive discharge.  The cervix is surgically absent.  Mild rugated hypertrophy with tenderness on bimanual exam.  No obvious abscess, mass or other abnormality on pelvic examination. Musculoskeletal:     Cervical back: Normal range of motion.  Skin:    General: Skin is warm and dry.  Neurological:     Mental Status: She is alert and oriented to person, place, and time.  Psychiatric:        Behavior: Behavior normal.     ED Results / Procedures / Treatments   Labs (all labs ordered are listed, but only abnormal results are displayed) Labs Reviewed - No  data to display  EKG None  Radiology No results found.  Procedures Procedures (including critical care time)  Medications Ordered in ED Medications - No data to display  ED Course  I have reviewed the triage vital signs and the nursing notes.  Pertinent labs & imaging results that were available during my care of the patient were reviewed by me and considered in my medical decision making (see chart for details).    MDM Rules/Calculators/A&P                           Final Clinical Impression(s) / ED Diagnoses Final diagnoses:  Vaginal pain   Patient here with cc of vaginal pain.  No evidence of infection, abscess, laceration or tear.  No masses palpable on examination.  Patient given a prescription for lidocaine ointment.  We will have her follow closely with her primary care physician who does her GYN care.  She appears otherwise appropriate for discharge at this time.  Rx / DC Orders ED Discharge Orders         Ordered    lidocaine (XYLOCAINE) 5 % ointment  4 times daily PRN        05/25/20 2012           Arthor Captain, PA-C 05/25/20 2054    Bethann Berkshire, MD 05/25/20 2238

## 2020-05-25 NOTE — Discharge Instructions (Addendum)
Get help right away if:  You have sudden severe pain.  Your pain gets steadily worse.  You have severe pain along with fever, nausea, vomiting, or excessive sweating.  You lose consciousness.

## 2020-05-25 NOTE — ED Triage Notes (Signed)
Pain in vaginal area possible abscess

## 2020-05-25 NOTE — Progress Notes (Signed)
Based on what you shared with me, I feel your condition warrants further evaluation and I recommend that you be seen for a face to face office visit.  I suspect you likely have an infection of your bartholin gland.  These need a face to face evaluation for treatment as often they will need to be opened and a special drain placed.  I would recommend an urgent care for treatment of this.   NOTE: If you entered your credit card information for this eVisit, you will not be charged. You may see a "hold" on your card for the $35 but that hold will drop off and you will not have a charge processed.   If you are having a true medical emergency please call 911.      For an urgent face to face visit, [facility] has five urgent care centers for your convenience:     [facility] at [facility] Directions [phone] [address] [place], [place] [address] . 10 am - 6pm Monday - Friday    [facility]) Get Driving Directions [phone] [address] [place], [place] [address] . 10 am to 8 pm Monday-Friday . 12 pm to 8 pm [facility] at [facility] Get Driving Directions [phone] [address] [place], [place] [address] . 8 am to 8 pm Monday-Friday . 9 am to 6 pm Saturday . 11 am to 6 pm Sunday     [facility] at [facility] Get Driving Directions  [phone] [address] [place], [place] [address] . 8 am to 8 pm Monday-Friday . 8 am to 4 pm [facility] Urgent Care at [facility] Directions [phone] [address] [place], [place] [address] . 12 pm to 6 pm Monday-Friday      Your e-visit answers were reviewed by a board certified advanced clinical practitioner to complete your personal care plan.  Thank you for using e-Visits.   Greater than 5 minutes, yet less than 10  minutes of time have been spent researching, coordinating, and implementing care for this patient today

## 2020-05-25 NOTE — ED Notes (Signed)
Pt spoke w Dr at [facility] who sent pt to Urgent Care  Per pt "not comfortable doing this"  Sent here from urgent care  Has had ? Abscess x 1 week and here after seeing providers who sent here

## 2020-05-25 NOTE — ED Triage Notes (Signed)
Pt presents with c/o severe pain in vagina that first developed about a week ago

## 2020-05-29 ENCOUNTER — Other Ambulatory Visit: Payer: Self-pay

## 2020-05-29 ENCOUNTER — Ambulatory Visit (INDEPENDENT_AMBULATORY_CARE_PROVIDER_SITE_OTHER): Payer: No Typology Code available for payment source | Admitting: Nurse Practitioner

## 2020-05-29 ENCOUNTER — Encounter: Payer: Self-pay | Admitting: Nurse Practitioner

## 2020-05-29 VITALS — BP 138/85 | HR 66 | Temp 98.1°F | Resp 20 | Ht 65.0 in | Wt 232.0 lb

## 2020-05-29 DIAGNOSIS — D649 Anemia, unspecified: Secondary | ICD-10-CM

## 2020-05-29 DIAGNOSIS — E7800 Pure hypercholesterolemia, unspecified: Secondary | ICD-10-CM

## 2020-05-29 DIAGNOSIS — B3741 Candidal cystitis and urethritis: Secondary | ICD-10-CM

## 2020-05-29 DIAGNOSIS — I10 Essential (primary) hypertension: Secondary | ICD-10-CM

## 2020-05-29 DIAGNOSIS — N750 Cyst of Bartholin's gland: Secondary | ICD-10-CM

## 2020-05-29 LAB — CMP14+EGFR
ALT: 9 IU/L (ref 0–32)
AST: 10 IU/L (ref 0–40)
Albumin/Globulin Ratio: 1.9 (ref 1.2–2.2)
Albumin: 4 g/dL (ref 3.8–4.9)
Alkaline Phosphatase: 110 IU/L (ref 44–121)
BUN/Creatinine Ratio: 16 (ref 12–28)
BUN: 10 mg/dL (ref 8–27)
Bilirubin Total: 0.6 mg/dL (ref 0.0–1.2)
CO2: 26 mmol/L (ref 20–29)
Calcium: 9.2 mg/dL (ref 8.7–10.3)
Chloride: 103 mmol/L (ref 96–106)
Creatinine, Ser: 0.62 mg/dL (ref 0.57–1.00)
GFR calc Af Amer: 113 mL/min/{1.73_m2} (ref >59)
GFR calc non Af Amer: 98 mL/min/{1.73_m2} (ref >59)
Globulin, Total: 2.1 g/dL (ref 1.5–4.5)
Glucose: 95 mg/dL (ref 65–99)
Potassium: 4.6 mmol/L (ref 3.5–5.2)
Sodium: 140 mmol/L (ref 134–144)
Total Protein: 6.1 g/dL (ref 6.0–8.5)

## 2020-05-29 LAB — LIPID PANEL
Chol/HDL Ratio: 3.8 ratio (ref 0.0–4.4)
Cholesterol, Total: 196 mg/dL (ref 100–199)
HDL: 51 mg/dL (ref >39)
LDL Chol Calc (NIH): 131 mg/dL — ABNORMAL HIGH (ref 0–99)
Triglycerides: 74 mg/dL (ref 0–149)
VLDL Cholesterol Cal: 14 mg/dL (ref 5–40)

## 2020-05-29 LAB — CBC
Hematocrit: 45.2 % (ref 34.0–46.6)
Hemoglobin: 14.4 g/dL (ref 11.1–15.9)
MCH: 28.9 pg (ref 26.6–33.0)
MCHC: 31.9 g/dL (ref 31.5–35.7)
MCV: 91 fL (ref 79–97)
Platelets: 251 10*3/uL (ref 150–450)
RBC: 4.99 x10E6/uL (ref 3.77–5.28)
RDW: 14.3 % (ref 11.7–15.4)
WBC: 4.9 10*3/uL (ref 3.4–10.8)

## 2020-05-29 MED ORDER — FLUCONAZOLE 150 MG PO TABS: ORAL_TABLET | ORAL | 0 refills | Status: DC

## 2020-05-29 MED ORDER — CIPROFLOXACIN HCL 500 MG PO TABS: 500.0000 mg | ORAL_TABLET | Freq: Two times a day (BID) | ORAL | 0 refills | Status: DC

## 2020-05-29 NOTE — Patient Instructions (Signed)
Bartholin's Cyst  A Bartholin's cyst is a fluid-filled sac that forms on a Bartholin's gland. Bartholin's glands are small glands in the folds of skin around the opening of the vagina (labia). This type of cyst causes a bulge or lump near the lower opening of the vagina. If you have a cyst that is small and not infected, you may be able to take care of it at home. If your cyst gets infected, it may cause pain and your doctor may need to drain it. An infected Bartholin's cyst is called a Bartholin's abscess. Follow these instructions at home: Medicines  Take over-the-counter and prescription medicines only as told by your doctor.  If you were prescribed an antibiotic medicine, take it as told by your doctor. Do not stop taking the antibiotic even if you start to feel better. Managing pain and swelling  Try sitz baths to help with pain and swelling. A sitz bath is a warm water bath in which the water only comes up to your hips and should cover your buttocks. You may take sitz baths a few times a day.  Put heat on the affected area as often as needed. Use the heat source that your doctor recommends, such as a moist heat pack or a heating pad. ? Place a towel between your skin and the heat source. ? Leave the heat on for 20-30 minutes. ? Remove the heat if your skin turns bright red. This is especially important if you cannot feel pain, heat, or cold. You may have a greater risk of getting burned. General instructions  If your cyst or abscess was drained: ? Follow instructions from your doctor about how to take care of your wound. ? Use feminine pads to absorb any fluid.  Do not push on or squeeze your cyst.  Do not have sex until the cyst has gone away or your wound from drainage has healed.  Take these steps to help prevent a Bartholin's cyst from returning, and to prevent other Bartholin's cysts from forming: ? Take a bath or shower once a day. Clean your vaginal area with mild soap and  water when you bathe. ? Practice safe sex to prevent STIs (sexually transmitted infections). Talk with your doctor about how to prevent STIs and which forms of birth control (contraception) may be best for you.  Keep all follow-up visits as told by your doctor. This is important. Contact a doctor if:  You have a fever.  You get redness, swelling, or pain around your cyst.  You have fluid, blood, pus, or a bad smell coming from your cyst.  You have a cyst that gets larger or comes back. Summary  A Bartholin's cyst is a fluid-filled sac that forms on a Bartholin's gland. These small glands are found in the folds of skin around the opening of the vagina (labia).  This type of cyst causes a bulge or lump near the lower opening of the vagina. An infected Bartholin's cyst is called a Bartholin's abscess.  Try sitz baths a few times a day to help with pain and swelling.  Do not push on or squeeze your cyst. This information is not intended to replace advice given to you by your health care provider. Make sure you discuss any questions you have with your health care provider. Document Revised: 04/29/2018 Document Reviewed: 04/08/2017 Elsevier Patient Education  2020 [employer]vier Inc[employer]  

## 2020-05-29 NOTE — Progress Notes (Signed)
   Subjective:    Patient ID: Sydney [name], female    DOB: Jan 11, 1961, 60 y.o.   MRN: [id number]   Chief Complaint: Pain in private area (Concerned it may be a cyst)   HPI ~2 weeks, felt small bump on left side of vaginal wall, is uncomfortable to sit, feels as if something is stabbing her. Painful to urinate, unlike a UTI, pressure feeling. Feels better if she leans to right. 3/10 pain, Lidocaine ointment prescribed but not used. Advil/tylenol with some relief. No gyn. Total hysterectomy in 1995. Not sexually active.     Review of Systems  Constitutional: Negative.   HENT: Negative.   Eyes: Negative.   Respiratory: Negative.   Cardiovascular: Negative.   Gastrointestinal: Negative.   Endocrine: Negative.   Genitourinary: Negative.   Musculoskeletal: Negative.   Skin: Negative.   Allergic/Immunologic: Negative.   Neurological: Negative.   Hematological: Negative.   Psychiatric/Behavioral: Negative.   All other systems reviewed and are negative.      Objective:   Physical Exam Vitals and nursing note reviewed.  Constitutional:      Appearance: Normal appearance.  Cardiovascular:     Rate and Rhythm: Normal rate.     Pulses: Normal pulses.  Pulmonary:     Effort: Pulmonary effort is normal.  Abdominal:     General: Abdomen is flat. Bowel sounds are normal.     Palpations: Abdomen is soft.  Genitourinary:    General: Normal vulva.     Vagina: Vaginal discharge (whitish cheesy looking ) present.     Rectum: Normal.     Comments: 2cm soft tender nodule on left posterior vaginal wall near introitus Skin:    General: Skin is warm and dry.     Capillary Refill: Capillary refill takes less than 2 seconds.  Neurological:     General: No focal deficit present.     Mental Status: She is alert and oriented to person, place, and time. Mental status is at baseline.  Psychiatric:        Mood and Affect: Mood normal.        Behavior: Behavior normal.        Thought Content:  Thought content normal.        Judgment: Judgment normal.   BP 138/85   Pulse 66   Temp 98.1 F (36.7 C) (Temporal)   Resp 20   Ht 5\' 5"  (1.651 m)   Wt 232 lb (105.2 kg)   SpO2 97%   BMI 38.61 kg/m        Assessment & Plan:  Rome Schlauch in today with chief complaint of Pain in private area (Concerned it may be a cyst)   1. Bartholin cyst Warm soaks RTO if no improvement in 3-5 days - ciprofloxacin (CIPRO) 500 MG tablet; Take 1 tablet (500 mg total) by mouth 2 (two) times daily.  Dispense: 20 tablet; Refill: 0  2. Yeast cystitis - fluconazole (DIFLUCAN) 150 MG tablet; 1 po q week x 4 weeks  Dispense: 4 tablet; Refill: 0    The above assessment and management plan was discussed with the patient. The patient verbalized understanding of and has agreed to the management plan. Patient is aware to call the clinic if symptoms persist or worsen. Patient is aware when to return to the clinic for a follow-up visit. Patient educated on when it is appropriate to go to the emergency department.   [name] [name], FNP

## 2020-05-30 LAB — TSH: TSH: 1.11 u[IU]/mL (ref 0.450–4.500)

## 2020-06-04 ENCOUNTER — Other Ambulatory Visit: Payer: Self-pay | Admitting: Family Medicine

## 2020-06-05 ENCOUNTER — Telehealth: Payer: Self-pay | Admitting: Radiology

## 2020-06-05 DIAGNOSIS — M7541 Impingement syndrome of right shoulder: Secondary | ICD-10-CM

## 2020-06-05 NOTE — Addendum Note (Signed)
Addended by: Rogers Seeds on: 06/05/2020 02:17 PM   Modules accepted: Orders

## 2020-06-05 NOTE — Telephone Encounter (Signed)
Order entered. Patient advised.  

## 2020-06-05 NOTE — Telephone Encounter (Signed)
OK , failed injection times 2 , rule out RC tear thanks

## 2020-06-05 NOTE — Telephone Encounter (Signed)
Pt continues to have right shoulder pain and is requesting an MRI. Please advise.  CB for patient is [phone]

## 2020-06-06 ENCOUNTER — Ambulatory Visit: Payer: No Typology Code available for payment source | Admitting: Family Medicine

## 2020-06-10 ENCOUNTER — Encounter (HOSPITAL_COMMUNITY): Payer: Self-pay | Admitting: Emergency Medicine

## 2020-06-10 ENCOUNTER — Emergency Department (HOSPITAL_COMMUNITY)
Admission: EM | Admit: 2020-06-10 | Discharge: 2020-06-10 | Disposition: A | Discharge status: Home / Self Care | Payer: No Typology Code available for payment source | Attending: Emergency Medicine | Admitting: Emergency Medicine

## 2020-06-10 ENCOUNTER — Emergency Department (HOSPITAL_COMMUNITY): Payer: No Typology Code available for payment source

## 2020-06-10 ENCOUNTER — Other Ambulatory Visit: Payer: Self-pay

## 2020-06-10 DIAGNOSIS — Z86010 Personal history of colonic polyps: Secondary | ICD-10-CM | POA: Diagnosis not present

## 2020-06-10 DIAGNOSIS — Z79899 Other long term (current) drug therapy: Secondary | ICD-10-CM | POA: Insufficient documentation

## 2020-06-10 DIAGNOSIS — S32502A Unspecified fracture of left pubis, initial encounter for closed fracture: Secondary | ICD-10-CM

## 2020-06-10 DIAGNOSIS — F17210 Nicotine dependence, cigarettes, uncomplicated: Secondary | ICD-10-CM | POA: Insufficient documentation

## 2020-06-10 DIAGNOSIS — Z96643 Presence of artificial hip joint, bilateral: Secondary | ICD-10-CM | POA: Diagnosis not present

## 2020-06-10 DIAGNOSIS — S3992XA Unspecified injury of lower back, initial encounter: Secondary | ICD-10-CM | POA: Diagnosis present

## 2020-06-10 DIAGNOSIS — W108XXA Fall (on) (from) other stairs and steps, initial encounter: Secondary | ICD-10-CM | POA: Diagnosis not present

## 2020-06-10 DIAGNOSIS — I10 Essential (primary) hypertension: Secondary | ICD-10-CM | POA: Diagnosis not present

## 2020-06-10 DIAGNOSIS — W19XXXA Unspecified fall, initial encounter: Secondary | ICD-10-CM

## 2020-06-10 MED ORDER — HYDROCODONE-ACETAMINOPHEN 5-325 MG PO TABS
1.0000 | ORAL_TABLET | Freq: Once | ORAL | Status: AC
  Administered 2020-06-10: 1 via ORAL
  Filled 2020-06-10: qty 1

## 2020-06-10 MED ORDER — HYDROCODONE-ACETAMINOPHEN 5-325 MG PO TABS: 1.0000 | ORAL_TABLET | ORAL | 0 refills | Status: DC | PRN

## 2020-06-10 NOTE — ED Triage Notes (Signed)
Pt fell down some basement stairs today and landed on her buttocks and her left wrist. Patient denies hitting her head or taking blood thinners.

## 2020-06-10 NOTE — ED Notes (Signed)
Wedding ring removed from left hand to right.  Ice pack applied to left arm.

## 2020-06-10 NOTE — Discharge Instructions (Addendum)
You have fractured the upper ring of your pelvis bone - this is a stable injury and should not prevent you from walking, weightbearing and any activity, however you want to minimize any activities that worsen your pain.  This will heal, usually without any difficulty or complications but can take some time to completely resolve.  Follow-up with Dr. Ophelia Charter as needed.  You have been prescribed a small quantity of hydrocodone, any additional needed for pain medication would need to come from Dr. Ophelia Charter or your primary doctor.  You may benefit from application of ice for any areas of pain for the next several days.  Also getting a donut pillow will offer comfort as sitting will be uncomfortable until this injury heals.  Do not drive in the first of taking hydrocodone as this medication will make you drowsy.  Your other x-rays are normal including your left wrist and hand.

## 2020-06-11 NOTE — ED Provider Notes (Signed)
[facility] EMERGENCY DEPARTMENT Provider Note   CSN: [id number] Arrival date & time: 06/10/20  1710     History Chief Complaint  Patient presents with  . Fall    Sydney [name] is a 59 y.o. female with history of arthritis, chronic low back pain and surgical history significant for bilateral total hip arthroplasty presenting for evaluation of pain in her lower back, buttocks and left wrist after losing balance on steps and falling.  She was going down a 3 step staircase to reach the landing to her basement when she lost her balance, fell backwards and landed directly on her buttocks on the step behind her.  She reached out with the left hand to break the fall and has significant pain in the wrist.  She is right handed.  She denies head injury also no lower extremity or hip pain but endorses deep pain in her buttocks.  She has had no treatment prior to arrival.   The history is provided by the patient.       Past Medical History:  Diagnosis Date  . Allergy   . Anemia    history of  . Anxiety   . Arthritis   . Asthma   . Atherosclerosis   . Depression   . Family history of adverse reaction to anesthesia    pt's mother felt everything and couldn't speak during a gallbladder surgery  . GERD (gastroesophageal reflux disease)   . History of hiatal hernia   . History of kidney stones   . Hypertension   . Pneumonia    a few times  . TIA (transient ischemic attack) 2012    Patient Active Problem List   Diagnosis Date Noted  . H/O adenomatous polyp of colon 05/23/2020  . FH: colon cancer in relative diagnosed at >49 years old 05/23/2020  . Spinal stenosis of lumbar region 05/03/2020  . Impingement syndrome of right shoulder 05/03/2020  . Other secondary scoliosis, lumbar region 03/01/2020  . H/O total hip arthroplasty 12/06/2019  . Arthritis of right hip 12/05/2019  . Unilateral primary osteoarthritis, right hip 11/17/2019  . Status post total replacement of left hip  09/15/2019  . Back pain 07/01/2018  . DDD (degenerative disc disease), lumbosacral 07/01/2018  . Venous insufficiency of both lower extremities 11/17/2016  . 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 Morbid obesity with BMI of 40.0-44.9, adult (HCC) 07/28/2016  . Chronic pain syndrome 10/24/2015  . Dysfunction of both eustachian tubes 07/26/2014  . Calculus of left kidney 04/25/2014  . Impaired fasting glucose 04/05/2014  . History of bladder surgery 04/03/2014  . History of hysterectomy 04/03/2014  . Urge incontinence of urine 04/03/2014  . Asthma 07/28/2013  . Nontoxic uninodular goiter 07/28/2013  . Solitary pulmonary nodule 07/28/2013  . Lumbar radiculopathy 04/20/2013  . Insomnia 03/22/2013  . Essential hypertension 08/18/2011  . Tobacco use disorder 08/18/2011  . Moderate episode of recurrent major depressive disorder (HCC) 04/03/2011  . Other B-complex deficiencies 08/16/2010  . Family history of cerebrovascular accident (CVA) 04/17/2010  . Family history of malignant neoplasm of gastrointestinal tract 04/17/2010  . Family history of other cardiovascular diseases(V17.49) 04/17/2010    Past Surgical History:  Procedure Laterality Date  . ABDOMINAL HYSTERECTOMY     abdominal  . APPENDECTOMY    . bladder tack    . CARPAL TUNNEL RELEASE Bilateral   . CERVICAL SPINE SURGERY     5-6, 6-7  . CHOLECYSTECTOMY    . HAMMER  TOE SURGERY Right   . JOINT REPLACEMENT    . TOTAL HIP 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   Procedure: LEFT TOTAL HIP ARTHROPLASTY -DIRECT ANTERIOR;  Surgeon: Eldred Manges, MD;  Location: [facility] OR;  Service: Orthopedics;  Laterality: Left;  . TOTAL HIP ARTHROPLASTY Right 12/05/2019   Procedure: RIGHT TOTAL HIP ARTHROPLASTY ANTERIOR APPROACH  DIRECT ANTERIOR;  Surgeon: Eldred Manges, MD;  Location: [facility] OR;  Service: Orthopedics;  Laterality: Right;     OB History   No obstetric history on file.     Family History  Problem Relation Age  of Onset  . Anxiety disorder Mother   . Depression Mother   . Heart disease Mother   . Hypertension Mother   . Arrhythmia Mother   . Cancer Father        colon, age greater than 21  . Lung cancer Father   . Migraines Sister   . Alcohol abuse Brother   . Cancer Daughter        nerve sheath sarcoma    Social History   Tobacco Use  . Smoking status: Current Every Day Smoker    Packs/day: 0.50    Years: 23.00    Pack years: 11.50    Types: Cigarettes  . Smokeless tobacco: Never Used  Vaping Use  . Vaping Use: Never used  Substance Use Topics  . Alcohol use: Yes    Comment: occ  . Drug use: Never    Home Medications Prior to Admission medications   Medication Sig Start Date End Date Taking? Authorizing Provider  albuterol (VENTOLIN HFA) 108 (90 Base) MCG/ACT inhaler Inhale into the lungs every 6 (six) hours as needed for wheezing or shortness of breath.    [provider]  amLODipine (NORVASC) 5 MG tablet TAKE 1 TABLET DAILY 06/05/20   Delynn Flavin M, DO  calcium carbonate (TUMS - DOSED IN MG ELEMENTAL CALCIUM) 500 MG chewable tablet Chew 2 tablets by mouth daily as needed for indigestion or heartburn.    [provider]  ciprofloxacin (CIPRO) 500 MG tablet Take 1 tablet (500 mg total) by mouth 2 (two) times daily. 05/29/20   Daphine Deutscher, [name], FNP  clonazePAM (KLONOPIN) 0.5 MG tablet Take 0.5 mg by mouth daily as needed for anxiety.    [provider]  fluconazole (DIFLUCAN) 150 MG tablet 1 po q week x 4 weeks 05/29/20   Bennie Pierini, FNP  HYDROcodone-acetaminophen (NORCO/VICODIN) 5-325 MG tablet Take 1 tablet by mouth every 4 (four) hours as needed. 06/10/20   Burgess Amor, PA-C  ibuprofen (ADVIL) 200 MG tablet Take 600 mg by mouth daily.    [provider]  lidocaine (XYLOCAINE) 5 % ointment Apply 1 application topically 4 (four) times daily as needed. Patient not taking: Reported on 05/29/2020 05/25/20   Arthor Captain, PA-C   metoprolol succinate (TOPROL-XL) 25 MG 24 hr tablet TAKE 1 TABLET DAILY 06/05/20   Delynn Flavin M, DO  sertraline (ZOLOFT) 100 MG tablet Take 100 mg by mouth daily.  11/21/13   [provider]  traZODone (DESYREL) 100 MG tablet Take 200 mg by mouth at bedtime.  07/28/13   [provider]    Allergies    Bee pollen, Bee venom, Butorphanol, Meloxicam, Penicillins, Prochlorperazine edisylate, Sulfa antibiotics, Tramadol, and Topiramate  Review of Systems   Review of Systems  Musculoskeletal: Positive for arthralgias, back pain and joint swelling. Negative for myalgias and neck pain.  Skin: Negative for wound.  Neurological: Negative for dizziness, weakness,  numbness and headaches.  All other systems reviewed and are negative.   Physical Exam Updated Vital Signs BP (!) 150/88   Pulse 61   Temp 98.9 F (37.2 C) (Temporal)   Resp 16   Ht 5\' 5"  (1.651 m)   Wt 104.3 kg   SpO2 99%   BMI 38.27 kg/m   Physical Exam Constitutional:      Appearance: She is well-developed.  HENT:     Head: Normocephalic and atraumatic.  Eyes:     Extraocular Movements: Extraocular movements intact.  Cardiovascular:     Rate and Rhythm: Normal rate.     Comments: Pulses equal bilaterally Musculoskeletal:        General: Tenderness present.     Left shoulder: No deformity or tenderness. Normal range of motion.     Left elbow: Normal. Normal range of motion.     Left forearm: Normal.     Left wrist: Bony tenderness present. No swelling, deformity, snuff box tenderness or crepitus. Normal pulse.     Left hand: Bony tenderness present. No swelling or deformity. Normal capillary refill.     Cervical back: Normal range of motion.     Comments: Bony ttp left 4th metacarpal, no deformity or edema.  Generalized pain left dorsal wrist without edema. Ice pack in place.   Skin:    General: Skin is warm and dry.     Findings: No abrasion, ecchymosis or wound.  Neurological:     General:  No focal deficit present.     Mental Status: She is alert and oriented to person, place, and time.     Sensory: No sensory deficit.     Deep Tendon Reflexes: Reflexes normal.  Psychiatric:        Mood and Affect: Mood normal.     ED Results / Procedures / Treatments   Labs (all labs ordered are listed, but only abnormal results are displayed) Labs Reviewed - No data to display  EKG None  Radiology DG Lumbar Spine Complete  Result Date: 06/10/2020 CLINICAL DATA:  Fall EXAM: LUMBAR SPINE - COMPLETE 4+ VIEW COMPARISON:  01/09/2020 FINDINGS: Dextroscoliosis. Stable lumbar vertebral body heights and alignment. Advanced multilevel degenerative changes with disc space narrowing, endplate osteophytes, and facet hypertrophy. Partially imaged bilateral hip arthroplasties. IMPRESSION: 1. No acute fracture. 2. Advanced multilevel degenerative changes. Electronically Signed   By: 01/11/2020 M.D.   On: 06/10/2020 18:40   DG Wrist Complete Left  Result Date: 06/10/2020 CLINICAL DATA:  Fall EXAM: LEFT WRIST - COMPLETE 3+ VIEW COMPARISON:  None. FINDINGS: Joint space narrowing within the radiocarpal joint. No acute bony abnormality. Specifically, no fracture, subluxation, or dislocation. IMPRESSION: No acute bony abnormality. Electronically Signed   By: 06/12/2020 M.D.   On: 06/10/2020 18:33   DG Hand Complete Left  Result Date: 06/10/2020 CLINICAL DATA:  Fall EXAM: LEFT HAND - COMPLETE 3+ VIEW COMPARISON:  Wrist series today FINDINGS: No acute bony abnormality. Specifically, no fracture, subluxation, or dislocation. Soft tissues unremarkable. Joint space narrowing in the radiocarpal joint. Remainder the joint spaces maintained. IMPRESSION: No acute bony abnormality. Electronically Signed   By: 06/12/2020 M.D.   On: 06/10/2020 18:33   DG Hips Bilat W or Wo Pelvis 3-4 Views  Result Date: 06/10/2020 CLINICAL DATA:  Fall EXAM: DG HIP (WITH OR WITHOUT PELVIS) 3-4V BILAT COMPARISON:  None.  FINDINGS: Bilateral hip replacements. No hardware complicating feature. Lucency noted in the left pubic bone region, only seen on the  AP view. Question left pubic bone fracture. No proximal femoral abnormality. IMPRESSION: Questionable left pubic bone fracture. Bilateral hip replacements. Electronically Signed   By: Charlett NoseKevin  Dover M.D.   On: 06/10/2020 18:35    Procedures Procedures (including critical care time)  Medications Ordered in ED Medications  HYDROcodone-acetaminophen (NORCO/VICODIN) 5-325 MG per tablet 1 tablet (1 tablet Oral Given 06/10/20 1824)    ED Course  I have reviewed the triage vital signs and the nursing notes.  Pertinent labs & imaging results that were available during my care of the patient were reviewed by me and considered in my medical decision making (see chart for details).    MDM Rules/Calculators/A&P                          Imaging reviewed and discussed with patient.  She does have pain c/w probable pubic bone fx.  This is not an unstable injury.  Discussed home care, donut pillow, activity as tolerated.  Also ice/heat recommendations.  She is acutely under the care of Dr. Ophelia CharterYates who is a assessing for a right shoulder concern, pending mri of this joint.  Advised f/u with him regarding this new injury/pain management while this continues to heal and/or contact pcp if further pain meds needed once ER script is gone.  Prn f/u anticipated. Final Clinical Impression(s) / ED Diagnoses Final diagnoses:  Fall, initial encounter  Closed nondisplaced fracture of left pubis, initial encounter [facility](HCC)    Rx / DC Orders ED Discharge Orders         Ordered    HYDROcodone-acetaminophen (NORCO/VICODIN) 5-325 MG tablet  Every 4 hours PRN        06/10/20 1915           Burgess Amordol, Reianna Batdorf, PA-C 06/11/20 1205    Vanetta MuldersZackowski, Scott, MD 06/26/20 1512

## 2020-06-19 ENCOUNTER — Ambulatory Visit (HOSPITAL_COMMUNITY): Admission: RE | Admit: 2020-06-19 | Payer: No Typology Code available for payment source | Source: Ambulatory Visit

## 2020-06-20 ENCOUNTER — Ambulatory Visit (HOSPITAL_COMMUNITY)
Admission: RE | Admit: 2020-06-20 | Discharge: 2020-06-20 | Disposition: A | Discharge status: Home / Self Care | Payer: No Typology Code available for payment source | Source: Ambulatory Visit | Attending: Orthopaedic Surgery | Admitting: Orthopaedic Surgery

## 2020-06-20 ENCOUNTER — Other Ambulatory Visit: Payer: Self-pay

## 2020-06-20 DIAGNOSIS — M7541 Impingement syndrome of right shoulder: Secondary | ICD-10-CM

## 2020-06-21 ENCOUNTER — Ambulatory Visit (HOSPITAL_COMMUNITY): Payer: No Typology Code available for payment source

## 2020-06-26 ENCOUNTER — Ambulatory Visit (INDEPENDENT_AMBULATORY_CARE_PROVIDER_SITE_OTHER): Payer: No Typology Code available for payment source | Admitting: Orthopaedic Surgery

## 2020-06-26 ENCOUNTER — Encounter: Payer: Self-pay | Admitting: Orthopaedic Surgery

## 2020-06-26 VITALS — BP 152/89 | HR 71 | Ht 65.0 in | Wt 230.0 lb

## 2020-06-26 DIAGNOSIS — M7541 Impingement syndrome of right shoulder: Secondary | ICD-10-CM

## 2020-06-26 NOTE — Progress Notes (Signed)
Office Visit Note   Patient: Sydney [name]           Date of Birth: 03-Feb-1961           MRN: [id number] Visit Date: 06/26/2020              Requested by: Raliegh Ip, DO [address] [place],  [place] [address] PCP: Raliegh Ip, DO   Assessment & Plan: Visit Diagnoses:  1. Impingement syndrome of right shoulder     Plan: We will check her back in 6 weeks we discussed pelvic fracture should continue to heal on its own.  Follow-Up Instructions: Return in about 6 weeks (around 08/07/2020).   Orders:  No orders of the defined types were placed in this encounter.  No orders of the defined types were placed in this encounter.     Procedures: No procedures performed   Clinical Data: No additional findings.   Subjective: Chief Complaint  Patient presents with  . Right Shoulder - Follow-up, Pain    MRI review  . Pelvis - Fracture    Fall 06/10/2020    HPI 59 year old female fell 06/10/2020.  MRI scan has been obtained available for review from 06/22/2019 one of the right shoulder.  This shows moderate supraspinatus and subscapularis tendinosis.  There is some intrasubstance subscapularis tearing no high-grade tear.  Some moderate glenohumeral and AC joint arthritis. Patient fell on the steps Monday before Thanksgiving.  She was diagnosed with a fracture pelvis she states her hips are okay she also had left wrist sprain.  She is also noticed a small ganglion next index finger of the knuckle.  She still has some problems with range of motion of her shoulder and discomfort. Patient had previous injection without sustained relief and conservative treatment.  Review of Systems use systems updated unchanged from 04/05/2020 office visit.   Objective: Vital Signs: BP (!) 152/89   Pulse 71   Ht 5\' 5"  (1.651 m)   Wt 230 lb (104.3 kg)   BMI 38.27 kg/m   Physical Exam Constitutional:      Appearance: She is well-developed.  HENT:     Head: Normocephalic.      Right Ear: External ear normal.     Left Ear: External ear normal.  Eyes:     Pupils: Pupils are equal, round, and reactive to light.  Neck:     Thyroid: No thyromegaly.     Trachea: No tracheal deviation.  Cardiovascular:     Rate and Rhythm: Normal rate.  Pulmonary:     Effort: Pulmonary effort is normal.  Abdominal:     Palpations: Abdomen is soft.  Skin:    General: Skin is warm and dry.  Neurological:     Mental Status: She is alert and oriented to person, place, and time.  Psychiatric:        Behavior: Behavior normal.     Ortho Exam patient is able to ambulate.  She has some tenderness of the long head of the biceps tendon and some pain with impingement and resisted internal and external rotation of the shoulder.  Some discomfort with straight leg raising 90 degrees. Specialty Comments:  No specialty comments available.  Imaging: No results found.   PMFS History: Patient Active Problem List   Diagnosis Date Noted  . H/O adenomatous polyp of colon 05/23/2020  . FH: colon cancer in relative diagnosed at >86 years old 05/23/2020  . Spinal stenosis of lumbar region 05/03/2020  .  Impingement syndrome of right shoulder 05/03/2020  . Other secondary scoliosis, lumbar region 03/01/2020  . H/O total hip arthroplasty 12/06/2019  . Arthritis of right hip 12/05/2019  . Unilateral primary osteoarthritis, right hip 11/17/2019  . Status post total replacement of left hip 09/15/2019  . Back pain 07/01/2018  . DDD (degenerative disc disease), lumbosacral 07/01/2018  . Venous insufficiency of both lower extremities 11/17/2016  . 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 Morbid obesity with BMI of 40.0-44.9, adult (HCC) 07/28/2016  . Chronic pain syndrome 10/24/2015  . Dysfunction of both eustachian tubes 07/26/2014  . Calculus of left kidney 04/25/2014  . Impaired fasting glucose 04/05/2014  . History of bladder surgery 04/03/2014  . History of  hysterectomy 04/03/2014  . Urge incontinence of urine 04/03/2014  . Asthma 07/28/2013  . Nontoxic uninodular goiter 07/28/2013  . Solitary pulmonary nodule 07/28/2013  . Lumbar radiculopathy 04/20/2013  . Insomnia 03/22/2013  . Essential hypertension 08/18/2011  . Tobacco use disorder 08/18/2011  . Moderate episode of recurrent major depressive disorder (HCC) 04/03/2011  . Other B-complex deficiencies 08/16/2010  . Family history of cerebrovascular accident (CVA) 04/17/2010  . Family history of malignant neoplasm of gastrointestinal tract 04/17/2010  . Family history of other cardiovascular diseases(V17.49) 04/17/2010   Past Medical History:  Diagnosis Date  . Allergy   . Anemia    history of  . Anxiety   . Arthritis   . Asthma   . Atherosclerosis   . Depression   . Family history of adverse reaction to anesthesia    pt's mother felt everything and couldn't speak during a gallbladder surgery  . GERD (gastroesophageal reflux disease)   . History of hiatal hernia   . History of kidney stones   . Hypertension   . Pneumonia    a few times  . TIA (transient ischemic attack) 2012    Family History  Problem Relation Age of Onset  . Anxiety disorder Mother   . Depression Mother   . Heart disease Mother   . Hypertension Mother   . Arrhythmia Mother   . Cancer Father        colon, age greater than 51  . Lung cancer Father   . Migraines Sister   . Alcohol abuse Brother   . Cancer Daughter        nerve sheath sarcoma    Past Surgical History:  Procedure Laterality Date  . ABDOMINAL HYSTERECTOMY     abdominal  . APPENDECTOMY    . bladder tack    . CARPAL TUNNEL RELEASE Bilateral   . CERVICAL SPINE SURGERY     5-6, 6-7  . CHOLECYSTECTOMY    . HAMMER TOE SURGERY Right   . JOINT REPLACEMENT    . TOTAL HIP 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   Procedure: LEFT TOTAL HIP ARTHROPLASTY -DIRECT ANTERIOR;  Surgeon: Eldred Manges, MD;  Location: [facility] OR;  Service: Orthopedics;   Laterality: Left;  . TOTAL HIP ARTHROPLASTY Right 12/05/2019   Procedure: RIGHT TOTAL HIP ARTHROPLASTY ANTERIOR APPROACH  DIRECT ANTERIOR;  Surgeon: Eldred Manges, MD;  Location: [facility] OR;  Service: Orthopedics;  Laterality: Right;   Social History   Occupational History  . Not on file  Tobacco Use  . Smoking status: Current Every Day Smoker    Packs/day: 0.50    Years: 23.00    Pack years: 11.50    Types: Cigarettes  . Smokeless tobacco: Never Used  Vaping Use  . Vaping  Use: Never used  Substance and Sexual Activity  . Alcohol use: Yes    Comment: occ  . Drug use: Never  . Sexual activity: Not Currently

## 2020-06-28 ENCOUNTER — Ambulatory Visit: Payer: No Typology Code available for payment source | Admitting: Orthopaedic Surgery

## 2020-07-05 NOTE — Patient Instructions (Signed)
Sydney [name]  07/05/2020     @PREFPERIOPPHARMACY @   Your procedure is scheduled on  05/10/2020.  Report to [facility] at  1030  A.M.  Call this number if you have problems the morning of surgery:  [phone]   Remember:  Follow the diet and prep instructions given to you by the office.                        Take these medicines the morning of surgery with A SIP OF WATER  Amlodipine, clonazepam(if needed), hydrocodone(if needed), metoprolol, zoloft. Use your inhaler before you come.    Do not wear jewelry, make-up or nail polish.  Do not wear lotions, powders, or perfumes. Please wear deodorant and brush your teeth.  Do not shave 48 hours prior to surgery.  Men may shave face and neck.  Do not bring valuables to the hospital.  [name] is not responsible for any belongings or valuables.  Contacts, dentures or bridgework may not be worn into surgery.  Leave your suitcase in the car.  After surgery it may be brought to your room.  For patients admitted to the hospital, discharge time will be determined by your treatment team.  Patients discharged the day of surgery will not be allowed to drive home.   Name and phone number of your driver:   family Special instructions:  DO NOT smoke the morning of your procedure.  Please read over the following fact sheets that you were given. Anesthesia Post-op Instructions and Care and Recovery After Surgery       Colonoscopy, Adult, Care After This sheet gives you information about how to care for yourself after your procedure. Your health care provider may also give you more specific instructions. If you have problems or questions, contact your health care provider. What can I expect after the procedure? After the procedure, it is common to have:  A small amount of blood in your stool for 24 hours after the procedure.  Some gas.  Mild cramping or bloating of your abdomen. Follow these instructions at home: Eating and  drinking   Drink enough fluid to keep your urine pale yellow.  Follow instructions from your health care provider about eating or drinking restrictions.  Resume your normal diet as instructed by your health care provider. Avoid heavy or fried foods that are hard to digest. Activity  Rest as told by your health care provider.  Avoid sitting for a long time without moving. Get up to take short walks every 1-2 hours. This is important to improve blood flow and breathing. Ask for help if you feel weak or unsteady.  Return to your normal activities as told by your health care provider. Ask your health care provider what activities are safe for you. Managing cramping and bloating   Try walking around when you have cramps or feel bloated.  Apply heat to your abdomen as told by your health care provider. Use the heat source that your health care provider recommends, such as a moist heat pack or a heating pad. ? Place a towel between your skin and the heat source. ? Leave the heat on for 20-30 minutes. ? Remove the heat if your skin turns bright red. This is especially important if you are unable to feel pain, heat, or cold. You may have a greater risk of getting burned. General instructions  For the first 24 hours after the procedure: ?  Do not drive or use machinery. ? Do not sign important documents. ? Do not drink alcohol. ? Do your regular daily activities at a slower pace than normal. ? Eat soft foods that are easy to digest.  Take over-the-counter and prescription medicines only as told by your health care provider.  Keep all follow-up visits as told by your health care provider. This is important. Contact a health care provider if:  You have blood in your stool 2-3 days after the procedure. Get help right away if you have:  More than a small spotting of blood in your stool.  Large blood clots in your stool.  Swelling of your abdomen.  Nausea or vomiting.  A  fever.  Increasing pain in your abdomen that is not relieved with medicine. Summary  After the procedure, it is common to have a small amount of blood in your stool. You may also have mild cramping and bloating of your abdomen.  For the first 24 hours after the procedure, do not drive or use machinery, sign important documents, or drink alcohol.  Get help right away if you have a lot of blood in your stool, nausea or vomiting, a fever, or increased pain in your abdomen. This information is not intended to replace advice given to you by your health care provider. Make sure you discuss any questions you have with your health care provider. Document Revised: 01/31/2019 Document Reviewed: 01/31/2019 Elsevier Patient Education  [place] After These instructions provide you with information about caring for yourself after your procedure. Your health care provider may also give you more specific instructions. Your treatment has been planned according to current medical practices, but problems sometimes occur. Call your health care provider if you have any problems or questions after your procedure. What can I expect after the procedure? After your procedure, you may:  Feel sleepy for several hours.  Feel clumsy and have poor balance for several hours.  Feel forgetful about what happened after the procedure.  Have poor judgment for several hours.  Feel nauseous or vomit.  Have a sore throat if you had a breathing tube during the procedure. Follow these instructions at home: For at least 24 hours after the procedure:      Have a responsible adult stay with you. It is important to have someone help care for you until you are awake and alert.  Rest as needed.  Do not: ? Participate in activities in which you could fall or become injured. ? Drive. ? Use heavy machinery. ? Drink alcohol. ? Take sleeping pills or medicines that cause  drowsiness. ? Make important decisions or sign legal documents. ? Take care of children on your own. Eating and drinking  Follow the diet that is recommended by your health care provider.  If you vomit, drink water, juice, or soup when you can drink without vomiting.  Make sure you have little or no nausea before eating solid foods. General instructions  Take over-the-counter and prescription medicines only as told by your health care provider.  If you have sleep apnea, surgery and certain medicines can increase your risk for breathing problems. Follow instructions from your health care provider about wearing your sleep device: ? Anytime you are sleeping, including during daytime naps. ? While taking prescription pain medicines, sleeping medicines, or medicines that make you drowsy.  If you smoke, do not smoke without supervision.  Keep all follow-up visits as told by your health care  provider. This is important. Contact a health care provider if:  You keep feeling nauseous or you keep vomiting.  You feel light-headed.  You develop a rash.  You have a fever. Get help right away if:  You have trouble breathing. Summary  For several hours after your procedure, you may feel sleepy and have poor judgment.  Have a responsible adult stay with you for at least 24 hours or until you are awake and alert. This information is not intended to replace advice given to you by your health care provider. Make sure you discuss any questions you have with your health care provider. Document Revised: 10/05/2017 Document Reviewed: 10/28/2015 Elsevier Patient Education  [place].

## 2020-07-06 ENCOUNTER — Encounter (HOSPITAL_COMMUNITY)
Admission: RE | Admit: 2020-07-06 | Discharge: 2020-07-06 | Disposition: A | Discharge status: Home / Self Care | Payer: No Typology Code available for payment source | Source: Ambulatory Visit | Attending: Internal Medicine | Admitting: Internal Medicine

## 2020-07-06 ENCOUNTER — Other Ambulatory Visit (HOSPITAL_COMMUNITY)
Admission: RE | Admit: 2020-07-06 | Discharge: 2020-07-06 | Disposition: A | Discharge status: Home / Self Care | Payer: No Typology Code available for payment source | Source: Ambulatory Visit | Attending: Internal Medicine | Admitting: Internal Medicine

## 2020-07-06 ENCOUNTER — Encounter (HOSPITAL_COMMUNITY): Payer: Self-pay

## 2020-07-06 ENCOUNTER — Other Ambulatory Visit: Payer: Self-pay

## 2020-07-06 DIAGNOSIS — Z01812 Encounter for preprocedural laboratory examination: Secondary | ICD-10-CM | POA: Diagnosis not present

## 2020-07-06 DIAGNOSIS — Z20822 Contact with and (suspected) exposure to covid-19: Secondary | ICD-10-CM | POA: Diagnosis not present

## 2020-07-06 LAB — BASIC METABOLIC PANEL
Anion gap: 6 (ref 5–15)
BUN: 10 mg/dL (ref 6–20)
CO2: 26 mmol/L (ref 22–32)
Calcium: 8.7 mg/dL — ABNORMAL LOW (ref 8.9–10.3)
Chloride: 103 mmol/L (ref 98–111)
Creatinine, Ser: 0.55 mg/dL (ref 0.44–1.00)
GFR, Estimated: >60 mL/min (ref >60)
Glucose, Bld: 104 mg/dL — ABNORMAL HIGH (ref 70–99)
Potassium: 3.9 mmol/L (ref 3.5–5.1)
Sodium: 135 mmol/L (ref 135–145)

## 2020-07-06 LAB — CBC WITH DIFFERENTIAL/PLATELET
Abs Immature Granulocytes: 0.01 10*3/uL (ref 0.00–0.07)
Basophils Absolute: 0.1 10*3/uL (ref 0.0–0.1)
Basophils Relative: 1 %
Eosinophils Absolute: 0.1 10*3/uL (ref 0.0–0.5)
Eosinophils Relative: 1 %
HCT: 45 % (ref 36.0–46.0)
Hemoglobin: 14.2 g/dL (ref 12.0–15.0)
Immature Granulocytes: 0 %
Lymphocytes Relative: 20 %
Lymphs Abs: 1 10*3/uL (ref 0.7–4.0)
MCH: 30.1 pg (ref 26.0–34.0)
MCHC: 31.6 g/dL (ref 30.0–36.0)
MCV: 95.3 fL (ref 80.0–100.0)
Monocytes Absolute: 0.3 10*3/uL (ref 0.1–1.0)
Monocytes Relative: 6 %
Neutro Abs: 3.7 10*3/uL (ref 1.7–7.7)
Neutrophils Relative %: 72 %
Platelets: 254 10*3/uL (ref 150–400)
RBC: 4.72 MIL/uL (ref 3.87–5.11)
RDW: 13.9 % (ref 11.5–15.5)
WBC: 5.2 10*3/uL (ref 4.0–10.5)
nRBC: 0 % (ref 0.0–0.2)

## 2020-07-07 LAB — SARS CORONAVIRUS 2 (TAT 6-24 HRS): SARS Coronavirus 2: NEGATIVE

## 2020-07-10 ENCOUNTER — Encounter (HOSPITAL_COMMUNITY)
Admission: RE | Disposition: A | Discharge status: Home / Self Care | Payer: Self-pay | Source: Ambulatory Visit | Attending: Internal Medicine

## 2020-07-10 ENCOUNTER — Encounter (HOSPITAL_COMMUNITY): Payer: Self-pay

## 2020-07-10 ENCOUNTER — Other Ambulatory Visit: Payer: Self-pay

## 2020-07-10 ENCOUNTER — Ambulatory Visit (HOSPITAL_COMMUNITY): Payer: No Typology Code available for payment source | Admitting: Anesthesiology

## 2020-07-10 ENCOUNTER — Ambulatory Visit (HOSPITAL_COMMUNITY)
Admission: RE | Admit: 2020-07-10 | Discharge: 2020-07-10 | Disposition: A | Discharge status: Home / Self Care | Payer: No Typology Code available for payment source | Source: Ambulatory Visit | Attending: Internal Medicine | Admitting: Internal Medicine

## 2020-07-10 DIAGNOSIS — Z886 Allergy status to analgesic agent status: Secondary | ICD-10-CM | POA: Insufficient documentation

## 2020-07-10 DIAGNOSIS — F17210 Nicotine dependence, cigarettes, uncomplicated: Secondary | ICD-10-CM | POA: Insufficient documentation

## 2020-07-10 DIAGNOSIS — Z882 Allergy status to sulfonamides status: Secondary | ICD-10-CM | POA: Insufficient documentation

## 2020-07-10 DIAGNOSIS — Z800 Family history of malignant neoplasm of digestive organs: Secondary | ICD-10-CM | POA: Diagnosis not present

## 2020-07-10 DIAGNOSIS — Z880 Allergy status to penicillin: Secondary | ICD-10-CM | POA: Diagnosis not present

## 2020-07-10 DIAGNOSIS — K5730 Diverticulosis of large intestine without perforation or abscess without bleeding: Secondary | ICD-10-CM | POA: Diagnosis not present

## 2020-07-10 DIAGNOSIS — Z885 Allergy status to narcotic agent status: Secondary | ICD-10-CM | POA: Insufficient documentation

## 2020-07-10 DIAGNOSIS — Z1211 Encounter for screening for malignant neoplasm of colon: Secondary | ICD-10-CM | POA: Diagnosis not present

## 2020-07-10 DIAGNOSIS — K648 Other hemorrhoids: Secondary | ICD-10-CM | POA: Insufficient documentation

## 2020-07-10 DIAGNOSIS — Z79899 Other long term (current) drug therapy: Secondary | ICD-10-CM | POA: Diagnosis not present

## 2020-07-10 DIAGNOSIS — Z86010 Personal history of colonic polyps: Secondary | ICD-10-CM | POA: Insufficient documentation

## 2020-07-10 HISTORY — PX: COLONOSCOPY WITH PROPOFOL: SHX5780

## 2020-07-10 SURGERY — COLONOSCOPY WITH PROPOFOL
Anesthesia: General

## 2020-07-10 MED ORDER — LACTATED RINGERS IV SOLN
Freq: Once | INTRAVENOUS | Status: AC
  Administered 2020-07-10: 1000 mL via INTRAVENOUS

## 2020-07-10 MED ORDER — LIDOCAINE HCL (CARDIAC) PF 100 MG/5ML IV SOSY
PREFILLED_SYRINGE | INTRAVENOUS | Status: DC | PRN
  Administered 2020-07-10: 50 mg via INTRAVENOUS

## 2020-07-10 MED ORDER — LACTATED RINGERS IV SOLN: INTRAVENOUS | Status: DC | PRN

## 2020-07-10 MED ORDER — PROPOFOL 10 MG/ML IV BOLUS
INTRAVENOUS | Status: DC | PRN
  Administered 2020-07-10: 30 mg via INTRAVENOUS
  Administered 2020-07-10 (×3): 50 mg via INTRAVENOUS
  Administered 2020-07-10: 120 mg via INTRAVENOUS

## 2020-07-10 NOTE — Anesthesia Procedure Notes (Signed)
Date/Time: 07/10/2020 12:10 PM Performed by: Julian Reil, CRNA Pre-anesthesia Checklist: Patient identified, Emergency Drugs available, Suction available and Patient being monitored Patient Re-evaluated:Patient Re-evaluated prior to induction Oxygen Delivery Method: Nasal cannula Induction Type: IV induction Placement Confirmation: positive ETCO2

## 2020-07-10 NOTE — Anesthesia Postprocedure Evaluation (Signed)
Anesthesia Post Note  Patient: Sydney [name]  Procedure(s) Performed: COLONOSCOPY WITH PROPOFOL (N/A )  Patient location during evaluation: Phase II Anesthesia Type: General Level of consciousness: awake and alert and oriented Pain management: pain level controlled Vital Signs Assessment: post-procedure vital signs reviewed and stable Respiratory status: spontaneous breathing, nonlabored ventilation and respiratory function stable Cardiovascular status: blood pressure returned to baseline and stable Postop Assessment: no apparent nausea or vomiting Anesthetic complications: no   No complications documented.   Last Vitals:  Vitals:   07/10/20 1245 07/10/20 1258  BP: 128/75 (!) 142/72  Pulse: (!) 52 60  Resp: 15 18  Temp:    SpO2: 99% 98%    Last Pain:  Vitals:   07/10/20 1258  TempSrc: Other (Comment)  PainSc: 0-No pain                 Julian Reil

## 2020-07-10 NOTE — Discharge Instructions (Addendum)
Colonoscopy Discharge Instructions  Read the instructions outlined below and refer to this sheet in the next few weeks. These discharge instructions provide you with general information on caring for yourself after you leave the hospital. Your doctor may also give you specific instructions. While your treatment has been planned according to the most current medical practices available, unavoidable complications occasionally occur.   ACTIVITY  You may resume your regular activity, but move at a slower pace for the next 24 hours.   Take frequent rest periods for the next 24 hours.   Walking will help get rid of the air and reduce the bloated feeling in your belly (abdomen).   No driving for 24 hours (because of the medicine (anesthesia) used during the test).    Do not sign any important legal documents or operate any machinery for 24 hours (because of the anesthesia used during the test).  NUTRITION  Drink plenty of fluids.   You may resume your normal diet as instructed by your doctor.   Begin with a light meal and progress to your normal diet. Heavy or fried foods are harder to digest and may make you feel sick to your stomach (nauseated).   Avoid alcoholic beverages for 24 hours or as instructed.  MEDICATIONS  You may resume your normal medications unless your doctor tells you otherwise.  WHAT YOU CAN EXPECT TODAY  Some feelings of bloating in the abdomen.   Passage of more gas than usual.   Spotting of blood in your stool or on the toilet paper.  IF YOU HAD POLYPS REMOVED DURING THE COLONOSCOPY:  No aspirin products for 7 days or as instructed.   No alcohol for 7 days or as instructed.   Eat a soft diet for the next 24 hours.  FINDING OUT THE RESULTS OF YOUR TEST Not all test results are available during your visit. If your test results are not back during the visit, make an appointment with your caregiver to find out the results. Do not assume everything is normal if  you have not heard from your caregiver or the medical facility. It is important for you to follow up on all of your test results.  SEEK IMMEDIATE MEDICAL ATTENTION IF:  You have more than a spotting of blood in your stool.   Your belly is swollen (abdominal distention).   You are nauseated or vomiting.   You have a temperature over 101.   You have abdominal pain or discomfort that is severe or gets worse throughout the day.   Your colonoscopy was relatively unremarkable. I did not find any polyps or evidence of colon cancer. Given the family history and personal history of colon polyps I would recommend we repeat this in 5 years.  You dohave diverticulosis and internal hemorrhoids. I would recommend increasing fiber in your diet or adding OTC Benefiber/Metamucil. Be sure to drink at least 4 to 6 glasses of water daily. Follow-up with GI as needed.   I hope you have a great rest of your week!  Hennie Duosharles K. Marletta Lorarver, D.O. Gastroenterology and Hepatology [facility]   Hemorrhoids Hemorrhoids are swollen veins that may develop:  In the butt (rectum). These are called internal hemorrhoids.  Around the opening of the butt (anus). These are called external hemorrhoids. Hemorrhoids can cause pain, itching, or bleeding. Most of the time, they do not cause serious problems. They usually get better with diet changes, lifestyle changes, and other home treatments. What are the causes? This  condition may be caused by:  Having trouble pooping (constipation).  Pushing hard (straining) to poop.  Watery poop (diarrhea).  Pregnancy.  Being very overweight (obese).  Sitting for long periods of time.  Heavy lifting or other activity that causes you to strain.  Anal sex.  Riding a bike for a long period of time. What are the signs or symptoms? Symptoms of this condition include:  Pain.  Itching or soreness in the butt.  Bleeding from the butt.  Leaking  poop.  Swelling in the area.  One or more lumps around the opening of your butt. How is this diagnosed? A doctor can often diagnose this condition by looking at the affected area. The doctor may also:  Do an exam that involves feeling the area with a gloved hand (digital rectal exam).  Examine the area inside your butt using a small tube (anoscope).  Order blood tests. This may be done if you have lost a lot of blood.  Have you get a test that involves looking inside the colon using a flexible tube with a camera on the end (sigmoidoscopy or colonoscopy). How is this treated? This condition can usually be treated at home. Your doctor may tell you to change what you eat, make lifestyle changes, or try home treatments. If these do not help, procedures can be done to remove the hemorrhoids or make them smaller. These may involve:  Placing rubber bands at the base of the hemorrhoids to cut off their blood supply.  Injecting medicine into the hemorrhoids to shrink them.  Shining a type of light energy onto the hemorrhoids to cause them to fall off.  Doing surgery to remove the hemorrhoids or cut off their blood supply. Follow these instructions at home: Eating and drinking   Eat foods that have a lot of fiber in them. These include whole grains, beans, nuts, fruits, and vegetables.  Ask your doctor about taking products that have added fiber (fibersupplements).  Reduce the amount of fat in your diet. You can do this by: ? Eating low-fat dairy products. ? Eating less red meat. ? Avoiding processed foods.  Drink enough fluid to keep your pee (urine) pale yellow. Managing pain and swelling   Take a warm-water bath (sitz bath) for 20 minutes to ease pain. Do this 3-4 times a day. You may do this in a bathtub or using a portable sitz bath that fits over the toilet.  If told, put ice on the painful area. It may be helpful to use ice between your warm baths. ? Put ice in a plastic  bag. ? Place a towel between your skin and the bag. ? Leave the ice on for 20 minutes, 2-3 times a day. General instructions  Take over-the-counter and prescription medicines only as told by your doctor. ? Medicated creams and medicines may be used as told.  Exercise often. Ask your doctor how much and what kind of exercise is best for you.  Go to the bathroom when you have the urge to poop. Do not wait.  Avoid pushing too hard when you poop.  Keep your butt dry and clean. Use wet toilet paper or moist towelettes after pooping.  Do not sit on the toilet for a long time.  Keep all follow-up visits as told by your doctor. This is important. Contact a doctor if you:  Have pain and swelling that do not get better with treatment or medicine.  Have trouble pooping.  Cannot poop.  Have  pain or swelling outside the area of the hemorrhoids. Get help right away if you have:  Bleeding that will not stop. Summary  Hemorrhoids are swollen veins in the butt or around the opening of the butt.  They can cause pain, itching, or bleeding.  Eat foods that have a lot of fiber in them. These include whole grains, beans, nuts, fruits, and vegetables.  Take a warm-water bath (sitz bath) for 20 minutes to ease pain. Do this 3-4 times a day. This information is not intended to replace advice given to you by your health care provider. Make sure you discuss any questions you have with your health care provider. Document Revised: 07/15/2018 Document Reviewed: 11/26/2017 Elsevier Patient Education  2020 [employer].  Diverticulosis  Diverticulosis is a condition that develops when small pouches (diverticula) form in the wall of the large intestine (colon). The colon is where water is absorbed and stool (feces) is formed. The pouches form when the inside layer of the colon pushes through weak spots in the outer layers of the colon. You may have a few pouches or many of them. The pouches usually do  not cause problems unless they become inflamed or infected. When this happens, the condition is called diverticulitis. What are the causes? The cause of this condition is not known. What increases the risk? The following factors may make you more likely to develop this condition:  Being older than age 1. Your risk for this condition increases with age. Diverticulosis is rare among people younger than age 69. By age 67, many people have it.  Eating a low-fiber diet.  Having frequent constipation.  Being overweight.  Not getting enough exercise.  Smoking.  Taking over-the-counter pain medicines, like aspirin and ibuprofen.  Having a family history of diverticulosis. What are the signs or symptoms? In most people, there are no symptoms of this condition. If you do have symptoms, they may include:  Bloating.  Cramps in the abdomen.  Constipation or diarrhea.  Pain in the lower left side of the abdomen. How is this diagnosed? Because diverticulosis usually has no symptoms, it is most often diagnosed during an exam for other colon problems. The condition may be diagnosed by:  Using a flexible scope to examine the colon (colonoscopy).  Taking an X-ray of the colon after dye has been put into the colon (barium enema).  Having a CT scan. How is this treated? You may not need treatment for this condition. Your health care provider may recommend treatment to prevent problems. You may need treatment if you have symptoms or if you previously had diverticulitis. Treatment may include:  Eating a high-fiber diet.  Taking a fiber supplement.  Taking a live bacteria supplement (probiotic).  Taking medicine to relax your colon. Follow these instructions at home: Medicines  Take over-the-counter and prescription medicines only as told by your health care provider.  If told by your health care provider, take a fiber supplement or probiotic. Constipation prevention Your condition  may cause constipation. To prevent or treat constipation, you may need to:  Drink enough fluid to keep your urine pale yellow.  Take over-the-counter or prescription medicines.  Eat foods that are high in fiber, such as beans, whole grains, and fresh fruits and vegetables.  Limit foods that are high in fat and processed sugars, such as fried or sweet foods.  General instructions  Try not to strain when you have a bowel movement.  Keep all follow-up visits as told by your  health care provider. This is important. Contact a health care provider if you:  Have pain in your abdomen.  Have bloating.  Have cramps.  Have not had a bowel movement in 3 days. Get help right away if:  Your pain gets worse.  Your bloating becomes very bad.  You have a fever or chills, and your symptoms suddenly get worse.  You vomit.  You have bowel movements that are bloody or black.  You have bleeding from your rectum. Summary  Diverticulosis is a condition that develops when small pouches (diverticula) form in the wall of the large intestine (colon).  You may have a few pouches or many of them.  This condition is most often diagnosed during an exam for other colon problems.  Treatment may include increasing the fiber in your diet, taking supplements, or taking medicines. This information is not intended to replace advice given to you by your health care provider. Make sure you discuss any questions you have with your health care provider. Document Revised: 02/03/2019 Document Reviewed: 02/03/2019 Elsevier Patient Education  2020 Else[employer] Inc.  High-Fiber Diet Fiber, also called dietary fiber, is a type of carbohydrate that is found in fruits, vegetables, whole grains, and beans. A high-fiber diet can have many health benefits. Your health care provider may recommend a high-fiber diet to help:  Prevent constipation. Fiber can make your bowel movements more regular.  Lower your  cholesterol.  Relieve the following conditions: ? Swelling of veins in the anus (hemorrhoids). ? Swelling and irritation (inflammation) of specific areas of the digestive tract (uncomplicated diverticulosis). ? A problem of the large intestine (colon) that sometimes causes pain and diarrhea (irritable bowel syndrome, IBS).  Prevent overeating as part of a weight-loss plan.  Prevent heart disease, type 2 diabetes, and certain cancers. What is my plan? The recommended daily fiber intake in grams (g) includes:  38 g for men age 1 or younger.  30 g for men over age 70.  25 g for women age 70 or younger.  21 g for women over age 41. You can get the recommended daily intake of dietary fiber by:  Eating a variety of fruits, vegetables, grains, and beans.  Taking a fiber supplement, if it is not possible to get enough fiber through your diet. What do I need to know about a high-fiber diet?  It is better to get fiber through food sources rather than from fiber supplements. There is not a lot of research about how effective supplements are.  Always check the fiber content on the nutrition facts label of any prepackaged food. Look for foods that contain 5 g of fiber or more per serving.  Talk with a diet and nutrition specialist (dietitian) if you have questions about specific foods that are recommended or not recommended for your medical condition, especially if those foods are not listed below.  Gradually increase how much fiber you consume. If you increase your intake of dietary fiber too quickly, you may have bloating, cramping, or gas.  Drink plenty of water. Water helps you to digest fiber. What are tips for following this plan?  Eat a wide variety of high-fiber foods.  Make sure that half of the grains that you eat each day are whole grains.  Eat breads and cereals that are made with whole-grain flour instead of refined flour or white flour.  Eat brown rice, bulgur wheat, or  millet instead of white rice.  Start the day with a breakfast that is  high in fiber, such as a cereal that contains 5 g of fiber or more per serving.  Use beans in place of meat in soups, salads, and pasta dishes.  Eat high-fiber snacks, such as berries, raw vegetables, nuts, and popcorn.  Choose whole fruits and vegetables instead of processed forms like juice or sauce. What foods can I eat?  Fruits Berries. Pears. Apples. Oranges. Avocado. Prunes and raisins. Dried figs. Vegetables Sweet potatoes. Spinach. Kale. Artichokes. Cabbage. Broccoli. Cauliflower. Green peas. Carrots. Squash. Grains Whole-grain breads. Multigrain cereal. Oats and oatmeal. Brown rice. Barley. Bulgur wheat. Millet. Quinoa. Bran muffins. Popcorn. Rye wafer crackers. Meats and other proteins Navy, kidney, and pinto beans. Soybeans. Split peas. Lentils. Nuts and seeds. Dairy Fiber-fortified yogurt. Beverages Fiber-fortified soy milk. Fiber-fortified orange juice. Other foods Fiber bars. The items listed above may not be a complete list of recommended foods and beverages. Contact a dietitian for more options. What foods are not recommended? Fruits Fruit juice. Cooked, strained fruit. Vegetables Fried potatoes. Canned vegetables. Well-cooked vegetables. Grains White bread. Pasta made with refined flour. White rice. Meats and other proteins Fatty cuts of meat. Fried chicken or fried fish. Dairy Milk. Yogurt. Cream cheese. Sour cream. Fats and oils Butters. Beverages Soft drinks. Other foods Cakes and pastries. The items listed above may not be a complete list of foods and beverages to avoid. Contact a dietitian for more information. Summary  Fiber is a type of carbohydrate. It is found in fruits, vegetables, whole grains, and beans.  There are many health benefits of eating a high-fiber diet, such as preventing constipation, lowering blood cholesterol, helping with weight loss, and reducing your risk  of heart disease, diabetes, and certain cancers.  Gradually increase your intake of fiber. Increasing too fast can result in cramping, bloating, and gas. Drink plenty of water while you increase your fiber.  The best sources of fiber include whole fruits and vegetables, whole grains, nuts, seeds, and beans. This information is not intended to replace advice given to you by your health care provider. Make sure you discuss any questions you have with your health care provider. Document Revised: 05/11/2017 Document Reviewed: 05/11/2017 Elsevier Patient Education  2020 [employer].

## 2020-07-10 NOTE — Op Note (Signed)
[facility] Patient Name: Sydney [name] Procedure Date: 07/10/2020 11:58 AM MRN: [id number] Date of Birth: 04/27/1961 Attending MD: Elon Alas. Abbey Chatters DO CSN: [id number] Age: 59 Admit Type: Outpatient Procedure:                Colonoscopy Indications:              High risk colon cancer surveillance: Personal                            history of colonic polyps Providers:                Elon Alas. Abbey Chatters, DO, Caprice Kluver, Tammy Vaught,                            RN, Nelma Rothman, Technician Referring MD:              Medicines:                See the Anesthesia note for documentation of the                            administered medications Complications:            No immediate complications. Estimated Blood Loss:     Estimated blood loss: none. Procedure:                Pre-Anesthesia Assessment:                           - The anesthesia plan was to use monitored                            anesthesia care (MAC).                           After obtaining informed consent, the colonoscope                            was passed under direct vision. Throughout the                            procedure, the patient's blood pressure, pulse, and                            oxygen saturations were monitored continuously. The                            [id number]-[id number] ([id number]) was introduced through the                            anus and advanced to the the cecum, identified by                            appendiceal orifice and ileocecal valve. The                            colonoscopy was performed without difficulty. The  patient tolerated the procedure well. The quality                            of the bowel preparation was evaluated using the                            BBPS [facility] Bowel Preparation Scale) with scores                            of: Right Colon = 2 (minor amount of residual                            staining, small fragments of stool and/or  opaque                            liquid, but mucosa seen well), Transverse Colon = 3                            (entire mucosa seen well with no residual staining,                            small fragments of stool or opaque liquid) and Left                            Colon = 3 (entire mucosa seen well with no residual                            staining, small fragments of stool or opaque                            liquid). The total BBPS score equals 8. The quality                            of the bowel preparation was good. Scope In: 12:09:05 PM Scope Out: 12:21:31 PM Scope Withdrawal Time: 0 hours 9 minutes 2 seconds  Total Procedure Duration: 0 hours 12 minutes 26 seconds  Findings:      The perianal and digital rectal examinations were normal.      Non-bleeding internal hemorrhoids were found during endoscopy.      Multiple small and large-mouthed diverticula were found in the sigmoid       colon and descending colon.      The exam was otherwise without abnormality. Impression:               - Non-bleeding internal hemorrhoids.                           - Diverticulosis in the sigmoid colon and in the                            descending colon.                           - The examination was otherwise normal.                           -  No specimens collected. Moderate Sedation:      Per Anesthesia Care Recommendation:           - Patient has a contact number available for                            emergencies. The signs and symptoms of potential                            delayed complications were discussed with the                            patient. Return to normal activities tomorrow.                            Written discharge instructions were provided to the                            patient.                           - Resume previous diet.                           - Continue present medications.                           - Repeat colonoscopy in 5 years for  surveillance                            and due to family history of colon cancer in father.                           - Return to GI clinic PRN. Procedure Code(s):        --- Professional ---                           [phone], Colonoscopy, flexible; diagnostic, including                            collection of specimen(s) by brushing or washing,                            when performed (separate procedure) Diagnosis Code(s):        --- Professional ---                           Z86.010, Personal history of colonic polyps                           K64.8, Other hemorrhoids                           K57.30, Diverticulosis of large intestine without                            perforation or  abscess without bleeding CPT copyright 2019 American Medical Association. All rights reserved. The codes documented in this report are preliminary and upon coder review may  be revised to meet current compliance requirements. Elon Alas. Abbey Chatters, DO Irvona Abbey Chatters, DO 07/10/2020 12:25:41 PM This report has been signed electronically. Number of Addenda: 0

## 2020-07-10 NOTE — Anesthesia Preprocedure Evaluation (Addendum)
Anesthesia Evaluation  Patient identified by MRN, date of birth, ID band Patient awake    Reviewed: Allergy & Precautions, NPO status , Patient's Chart, lab work & pertinent test results  History of Anesthesia Complications (+) Family history of anesthesia reaction and history of anesthetic complications (mother had awreness under anesthsia )  Airway Mallampati: III  TM Distance: >3 FB Neck ROM: Full   Comment: Neck fusion Dental  (+) Dental Advisory Given, Teeth Intact   Pulmonary asthma , pneumonia, resolved, Current Smoker and Patient abstained from smoking.,    Pulmonary exam normal breath sounds clear to auscultation       Cardiovascular Exercise Tolerance: Good hypertension, Pt. on medications Normal cardiovascular exam Rhythm:Regular Rate:Normal     Neuro/Psych PSYCHIATRIC DISORDERS Anxiety Depression TIA Neuromuscular disease    GI/Hepatic hiatal hernia, GERD  Medicated,  Endo/Other    Renal/GU Renal disease     Musculoskeletal  (+) Arthritis , Neck fusion   Abdominal   Peds  Hematology  (+) anemia ,   Anesthesia Other Findings H/o fall before thanksgiving, pelvic fx  Reproductive/Obstetrics                            Anesthesia Physical Anesthesia Plan  ASA: III  Anesthesia Plan: General   Post-op Pain Management:    Induction: Intravenous  PONV Risk Score and Plan: TIVA  Airway Management Planned: Nasal Cannula and Natural Airway  Additional Equipment:   Intra-op Plan:   Post-operative Plan:   Informed Consent: I have reviewed the patients History and Physical, chart, labs and discussed the procedure including the risks, benefits and alternatives for the proposed anesthesia with the patient or authorized representative who has indicated his/her understanding and acceptance.     Dental advisory given  Plan Discussed with: CRNA and Surgeon  Anesthesia Plan  Comments:         Anesthesia Quick Evaluation

## 2020-07-10 NOTE — Transfer of Care (Signed)
Immediate Anesthesia Transfer of Care Note  Patient: Sydney [name]  Procedure(s) Performed: COLONOSCOPY WITH PROPOFOL (N/A )  Patient Location: PACU  Anesthesia Type:General  Level of Consciousness: awake, alert  and oriented  Airway & Oxygen Therapy: Patient Spontanous Breathing  Post-op Assessment: Report given to RN and Post -op Vital signs reviewed and stable  Post vital signs: Reviewed and stable  Last Vitals:  Vitals Value Taken Time  BP    Temp    Pulse 65 07/10/20 1226  Resp 20 07/10/20 1226  SpO2 96 % 07/10/20 1226  Vitals shown include unvalidated device data.  Last Pain:  Vitals:   07/10/20 1204  TempSrc:   PainSc: 5       Patients Stated Pain Goal: 8 (07/10/20 1104)  Complications: No complications documented.

## 2020-07-10 NOTE — H&P (Signed)
Primary Care Physician:  Raliegh Ip, DO Primary Gastroenterologist:  Dr. Marletta Lor  Pre-Procedure History & Physical: HPI:  Sydney [name] is a 59 y.o. female is here for a colonoscopy to be performed for surveillance due personal history of adenomatous colon polyps and family history of colon cancer.    Last colonoscopy December 2015 at [facility].  Polypectomy performed.  Pathology unavailable. Patient states she was advised to come back in five years.  Father had colon cancer age greater than 41.  Past Medical History:  Diagnosis Date  . Allergy   . Anemia    history of  . Anxiety   . Arthritis   . Asthma   . Atherosclerosis   . Depression   . Family history of adverse reaction to anesthesia    pt's mother felt everything and couldn't speak during a gallbladder surgery  . GERD (gastroesophageal reflux disease)   . History of hiatal hernia   . History of kidney stones   . Hypertension   . Pneumonia    a few times  . TIA (transient ischemic attack) 2012    Past Surgical History:  Procedure Laterality Date  . ABDOMINAL HYSTERECTOMY     abdominal  . APPENDECTOMY    . bladder tack    . CARPAL TUNNEL RELEASE Bilateral   . CERVICAL SPINE SURGERY     5-6, 6-7  . CHOLECYSTECTOMY    . HAMMER TOE SURGERY Right   . JOINT REPLACEMENT    . TOTAL HIP 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   Procedure: LEFT TOTAL HIP ARTHROPLASTY -DIRECT ANTERIOR;  Surgeon: Eldred Manges, MD;  Location: [facility];  Service: Orthopedics;  Laterality: Left;  . TOTAL HIP ARTHROPLASTY Right 12/05/2019   Procedure: RIGHT TOTAL HIP ARTHROPLASTY ANTERIOR APPROACH  DIRECT ANTERIOR;  Surgeon: Eldred Manges, MD;  Location: [facility];  Service: Orthopedics;  Laterality: Right;    Prior to Admission medications   Medication Sig Start Date End Date Taking? Authorizing Provider  acetaminophen (TYLENOL) 500 MG tablet Take 1,000 mg by mouth every 8 (eight) hours as needed for moderate pain.   Yes [provider]   albuterol (VENTOLIN HFA) 108 (90 Base) MCG/ACT inhaler Inhale 2 puffs into the lungs every 6 (six) hours as needed for wheezing or shortness of breath.   Yes [provider]  amLODipine (NORVASC) 5 MG tablet TAKE 1 TABLET DAILY Patient taking differently: Take 5 mg by mouth daily. 06/05/20  Yes Delynn Flavin M, DO  calcium carbonate (TUMS - DOSED IN MG ELEMENTAL CALCIUM) 500 MG chewable tablet Chew 2 tablets by mouth daily as needed for indigestion or heartburn.   Yes [provider]  clonazePAM (KLONOPIN) 0.5 MG tablet Take 0.5 mg by mouth daily as needed for anxiety.   Yes [provider]  HYDROcodone-acetaminophen (NORCO/VICODIN) 5-325 MG tablet Take 1 tablet by mouth every 4 (four) hours as needed. Patient taking differently: Take 1 tablet by mouth every 4 (four) hours as needed for severe pain. 06/10/20  Yes Idol, Raynelle Fanning, PA-C  metoprolol succinate (TOPROL-XL) 25 MG 24 hr tablet TAKE 1 TABLET DAILY Patient taking differently: Take 25 mg by mouth daily. 06/05/20  Yes Gottschalk, Kathie Rhodes M, DO  sertraline (ZOLOFT) 100 MG tablet Take 100 mg by mouth daily.  11/21/13  Yes [provider]  traZODone (DESYREL) 100 MG tablet Take 200 mg by mouth at bedtime.  07/28/13  Yes [provider]  ciprofloxacin (CIPRO) 500 MG tablet Take 1 tablet (500 mg  total) by mouth 2 (two) times daily. Patient not taking: No sig reported 05/29/20   Bennie Pierini, FNP  fluconazole (DIFLUCAN) 150 MG tablet 1 po q week x 4 weeks Patient not taking: Reported on 06/29/2020 05/29/20   Bennie Pierini, FNP  lidocaine (XYLOCAINE) 5 % ointment Apply 1 application topically 4 (four) times daily as needed. Patient not taking: No sig reported 05/25/20   Arthor Captain, PA-C    Allergies as of 05/23/2020 - Review Complete 05/23/2020  Allergen Reaction Noted  . Bee pollen Anaphylaxis and Swelling 04/27/2014  . Bee venom Anaphylaxis and Swelling 08/18/2019  . Butorphanol  Other (See Comments) 03/22/2013  . Meloxicam Anxiety 04/20/2013  . Penicillins Anaphylaxis and Hives 03/22/2013  . Prochlorperazine edisylate Anaphylaxis 03/22/2013  . Sulfa antibiotics Anaphylaxis 04/18/2013  . Tramadol Anxiety 04/18/2013  . Topiramate Nausea And Vomiting 10/24/2015    Family History  Problem Relation Age of Onset  . Anxiety disorder Mother   . Depression Mother   . Heart disease Mother   . Hypertension Mother   . Arrhythmia Mother   . Cancer Father        colon, age greater than 70  . Lung cancer Father   . Migraines Sister   . Alcohol abuse Brother   . Cancer Daughter        nerve sheath sarcoma    Social History   Socioeconomic History  . Marital status: Married    Spouse name: Not on file  . Number of children: 3  . Years of education: Not on file  . Highest education level: Not on file  Occupational History  . Not on file  Tobacco Use  . Smoking status: Current Every Day Smoker    Packs/day: 0.50    Years: 23.00    Pack years: 11.50    Types: Cigarettes  . Smokeless tobacco: Never Used  Vaping Use  . Vaping Use: Never used  Substance and Sexual Activity  . Alcohol use: Yes    Comment: occ  . Drug use: Never  . Sexual activity: Not Currently  Other Topics Concern  . Not on file  Social History Narrative   Recently relocated to [place] from [place].  She resides at home with her husband.   She has 3 children but 1 passed away from cancer.   Social Determinants of Health   Financial Resource Strain: Not on file  Food Insecurity: Not on file  Transportation Needs: Not on file  Physical Activity: Not on file  Stress: Not on file  Social Connections: Not on file  Intimate Partner Violence: Not on file    Review of Systems: See HPI, otherwise negative ROS  Impression/Plan: Sydney [name] is here for a colonoscopy to be performed for surveillance due personal history of adenomatous colon polyps and family history of colon  cancer.   The risks of the procedure including infection, bleed, or perforation as well as benefits, limitations, alternatives and imponderables have been reviewed with the patient. Questions have been answered. All parties agreeable.

## 2020-07-18 ENCOUNTER — Encounter (HOSPITAL_COMMUNITY): Payer: Self-pay | Admitting: Internal Medicine

## 2020-08-08 ENCOUNTER — Telehealth (INDEPENDENT_AMBULATORY_CARE_PROVIDER_SITE_OTHER): Payer: No Typology Code available for payment source | Admitting: Family Medicine

## 2020-08-08 ENCOUNTER — Encounter: Payer: Self-pay | Admitting: Family Medicine

## 2020-08-08 DIAGNOSIS — R0981 Nasal congestion: Secondary | ICD-10-CM | POA: Diagnosis not present

## 2020-08-08 DIAGNOSIS — R509 Fever, unspecified: Secondary | ICD-10-CM

## 2020-08-08 DIAGNOSIS — R21 Rash and other nonspecific skin eruption: Secondary | ICD-10-CM

## 2020-08-08 MED ORDER — HYDROCORTISONE 2.5 % EX OINT
TOPICAL_OINTMENT | Freq: Two times a day (BID) | CUTANEOUS | 0 refills | Status: DC
Start: 2020-08-08 — End: 2020-09-07

## 2020-08-08 MED ORDER — HYDROXYZINE HCL 25 MG PO TABS: 25.0000 mg | ORAL_TABLET | Freq: Three times a day (TID) | ORAL | 0 refills | Status: DC | PRN

## 2020-08-08 NOTE — Progress Notes (Signed)
Virtual Visit via video Note   Due to COVID-19 pandemic this visit was conducted virtually via video. This visit type was conducted due to national recommendations for restrictions regarding the COVID-19 Pandemic (e.g. social distancing, sheltering in place) in an effort to limit this patient's exposure and mitigate transmission in our community. All issues noted in this document were discussed and addressed.  A physical exam was not performed with this format.  I connected with  Sydney [name]  on 08/08/20 at 1434 by video and verified that I am speaking with the correct person using two identifiers. Sydney [name] is currently located at home and her granddaughter is currently with her during the visit. The provider, Gabriel Earing, FNP is located in their office at time of visit.  I discussed the limitations, risks, security and privacy concerns of performing an evaluation and management service by video  and the availability of in person appointments. I also discussed with the patient that there may be a patient responsible charge related to this service. The patient expressed understanding and agreed to proceed.  CC: rash  History and Present Illness: Sydney [name] reports a rash x 6 days. The rash started on her lower legs. A few days later it spread above her knees on both legs. Yesterday the rash spread to her arms. The rash on her legs has improved. She reports the rash as red spots. Some are flat and some are raised. The rash is itchy. She has been taking benadryl with some improvement. She denies blister like areas, drainage, or warmth. She also reports a headache, chills, head congestion, and fever that started the same time as the rash. She has been vaccinated against Covid but has not had a booster. She denies known exposure. She reports that she feels fine now other than the rash. Denies chest pain or shortness or breath.   I reviewed medical and social history.  ROS As per HPI.    Observations/Objective: Alert and oriented, no obvious distress. Respiration appear nonlabored. Maculopapular red diffuse rash noted to bilaterally arms and legs, no drainage, vesicles, or surrounding erythema noted. No edema noted.   Assessment and Plan: Diagnoses and all orders for this visit:  Rash Likely viral rash as rash presentation coincided with fever, chills, and congestion. Patient will come by office for Covid testing. Hydroxyzine for itching, do not take benadryl with hydroxyzine. Steroid cream provided. Return to office for new or worsening symptoms, or if symptoms persist.  -     Novel Coronavirus, NAA ([facility]) -     hydrOXYzine (ATARAX/VISTARIL) 25 MG tablet; Take 1 tablet (25 mg total) by mouth 3 (three) times daily as needed. -     hydrocortisone 2.5 % ointment; Apply topically 2 (two) times daily.  Fever, unspecified fever cause/Head congestion Resolved. Patient will come for Covid testing. AT this point, she would be beyond quarantine requirement as symptoms started 6 days ago however she would like to know for those who she may have inadvertently exposed.  -     Novel Coronavirus, NAA ([facility])  Follow Up Instructions: As needed.     I discussed the assessment and treatment plan with the patient. The patient was provided an opportunity to ask questions and all were answered. The patient agreed with the plan and demonstrated an understanding of the instructions.   The patient was advised to call back or seek an in-person evaluation if the symptoms worsen or if the condition fails to improve as anticipated.  The  above assessment and management plan was discussed with the patient. The patient verbalized understanding of and has agreed to the management plan. Patient is aware to call the clinic if symptoms persist or worsen. Patient is aware when to return to the clinic for a follow-up visit. Patient educated on when it is appropriate to go to the emergency department.    Time video call ended: 25   Sydney Battiste Hughes Better, FNP

## 2020-08-10 LAB — NOVEL CORONAVIRUS, NAA: SARS-CoV-2, NAA: DETECTED — AB

## 2020-08-10 LAB — SARS-COV-2, NAA 2 DAY TAT

## 2020-08-16 ENCOUNTER — Other Ambulatory Visit: Payer: Self-pay

## 2020-08-16 ENCOUNTER — Ambulatory Visit (INDEPENDENT_AMBULATORY_CARE_PROVIDER_SITE_OTHER): Payer: No Typology Code available for payment source

## 2020-08-16 ENCOUNTER — Ambulatory Visit (INDEPENDENT_AMBULATORY_CARE_PROVIDER_SITE_OTHER): Payer: No Typology Code available for payment source | Admitting: Orthopaedic Surgery

## 2020-08-16 ENCOUNTER — Encounter: Payer: Self-pay | Admitting: Orthopaedic Surgery

## 2020-08-16 VITALS — Ht 65.5 in | Wt 230.0 lb

## 2020-08-16 DIAGNOSIS — M79605 Pain in left leg: Secondary | ICD-10-CM

## 2020-08-16 DIAGNOSIS — S32592S Other specified fracture of left pubis, sequela: Secondary | ICD-10-CM | POA: Diagnosis not present

## 2020-08-16 NOTE — Progress Notes (Signed)
Office Visit Note   Patient: Sydney [name]           Date of Birth: 01/05/1961           MRN: [id number] Visit Date: 08/16/2020              Requested by: Raliegh Ip, DO [address] [place],  [place] [address] PCP: Raliegh Ip, DO   Assessment & Plan: Visit Diagnoses:  1. Pain in left leg   2. Closed fracture of multiple pubic rami, left, sequela     Plan: We discussed that she would do well to begin a lower extremity strengthening program once her fracture is healed either joining the gym or silver sneakers etc.  We went over multiple exercises that will help with her lower extremity strength.  She can call if she like to go through formal physical therapy.  She has been noting improvement every week with decreasing symptoms.  I can recheck on an as-needed basis.  Follow-Up Instructions: No follow-ups on file.   Orders:  Orders Placed This Encounter  Procedures  . XR Pelvis 1-2 Views   No orders of the defined types were placed in this encounter.     Procedures: No procedures performed   Clinical Data: No additional findings.   Subjective: Chief Complaint  Patient presents with  . Left Leg - Pain  . Pelvis - Follow-up, Fracture    Fall 06/10/2020    HPI 60 year old female previous total of arthroplasties left and right in February and May 2021 fell 06/10/2020 with left pubic rami fracture.  She still using a cane she grabs the wall in the house.  She still has pain if she sits on a hard surface.  No numbness or tingling in her feet.  She is noticing gradual improvement week to week.  At times she felt like she catches her feet or feels like she gets her feet crossed somehow.  Review of Systems 14 point system update unchanged since her hip surgeries last year other than as mentioned above.  Objective: Vital Signs: Ht 5' 5.5" (1.664 m)   Wt 230 lb (104.3 kg)   BMI 37.69 kg/m   Physical Exam Constitutional:      Appearance: She is  well-developed.  HENT:     Head: Normocephalic.     Right Ear: External ear normal.     Left Ear: External ear normal.  Eyes:     Pupils: Pupils are equal, round, and reactive to light.  Neck:     Thyroid: No thyromegaly.     Trachea: No tracheal deviation.  Cardiovascular:     Rate and Rhythm: Normal rate.  Pulmonary:     Effort: Pulmonary effort is normal.  Abdominal:     Palpations: Abdomen is soft.  Skin:    General: Skin is warm and dry.  Neurological:     Mental Status: She is alert and oriented to person, place, and time.  Psychiatric:        Mood and Affect: Mood and affect normal.        Behavior: Behavior normal.     Ortho Exam patient still has mild tenderness over the pubic symphysis just left and also left ischial tuberosity.  Good range of motion of her hips knees reach full extension.  Specialty Comments:  No specialty comments available.  Imaging: No results found.   PMFS History: Patient Active Problem List   Diagnosis Date Noted  . Closed fracture  of multiple pubic rami, left, sequela 08/16/2020  . H/O adenomatous polyp of colon 05/23/2020  . FH: colon cancer in relative diagnosed at >24 years old 05/23/2020  . Spinal stenosis of lumbar region 05/03/2020  . Impingement syndrome of right shoulder 05/03/2020  . Other secondary scoliosis, lumbar region 03/01/2020  . H/O total hip arthroplasty 12/06/2019  . Arthritis of right hip 12/05/2019  . Unilateral primary osteoarthritis, right hip 11/17/2019  . Status post total replacement of left hip 09/15/2019  . Back pain 07/01/2018  . DDD (degenerative disc disease), lumbosacral 07/01/2018  . Venous insufficiency of both lower extremities 11/17/2016  . 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 Morbid obesity with BMI of 40.0-44.9, adult (HCC) 07/28/2016  . Chronic pain syndrome 10/24/2015  . Dysfunction of both eustachian tubes 07/26/2014  . Calculus of left kidney  04/25/2014  . Impaired fasting glucose 04/05/2014  . History of bladder surgery 04/03/2014  . History of hysterectomy 04/03/2014  . Urge incontinence of urine 04/03/2014  . Asthma 07/28/2013  . Nontoxic uninodular goiter 07/28/2013  . Solitary pulmonary nodule 07/28/2013  . Lumbar radiculopathy 04/20/2013  . Insomnia 03/22/2013  . Essential hypertension 08/18/2011  . Tobacco use disorder 08/18/2011  . Moderate episode of recurrent major depressive disorder (HCC) 04/03/2011  . Other B-complex deficiencies 08/16/2010  . Family history of cerebrovascular accident (CVA) 04/17/2010  . Family history of malignant neoplasm of gastrointestinal tract 04/17/2010  . Family history of other cardiovascular diseases(V17.49) 04/17/2010   Past Medical History:  Diagnosis Date  . Allergy   . Anemia    history of  . Anxiety   . Arthritis   . Asthma   . Atherosclerosis   . Depression   . Family history of adverse reaction to anesthesia    pt's mother felt everything and couldn't speak during a gallbladder surgery  . GERD (gastroesophageal reflux disease)   . History of hiatal hernia   . History of kidney stones   . Hypertension   . Pneumonia    a few times  . TIA (transient ischemic attack) 2012    Family History  Problem Relation Age of Onset  . Anxiety disorder Mother   . Depression Mother   . Heart disease Mother   . Hypertension Mother   . Arrhythmia Mother   . Cancer Father        colon, age greater than 59  . Lung cancer Father   . Migraines Sister   . Alcohol abuse Brother   . Cancer Daughter        nerve sheath sarcoma    Past Surgical History:  Procedure Laterality Date  . ABDOMINAL HYSTERECTOMY     abdominal  . APPENDECTOMY    . bladder tack    . CARPAL TUNNEL RELEASE Bilateral   . CERVICAL SPINE SURGERY     5-6, 6-7  . CHOLECYSTECTOMY    . COLONOSCOPY WITH PROPOFOL N/A 07/10/2020   Procedure: COLONOSCOPY WITH PROPOFOL;  Surgeon: Lanelle Bal, DO;   [facility];  Service: Endoscopy;  Laterality: N/A;  12:15pm  . HAMMER TOE SURGERY Right   . JOINT REPLACEMENT    . TOTAL HIP 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   Procedure: LEFT TOTAL HIP ARTHROPLASTY -DIRECT ANTERIOR;  Surgeon: Eldred Manges, MD;  Location: [facility];  Service: Orthopedics;  Laterality: Left;  . TOTAL HIP ARTHROPLASTY Right 12/05/2019   Procedure: RIGHT TOTAL HIP ARTHROPLASTY ANTERIOR APPROACH  DIRECT ANTERIOR;  Surgeon: Ophelia Charter,  Veverly Fells, MD;  Location: [facility];  Service: Orthopedics;  Laterality: Right;   Social History   Occupational History  . Not on file  Tobacco Use  . Smoking status: Current Every Day Smoker    Packs/day: 0.50    Years: 23.00    Pack years: 11.50    Types: Cigarettes  . Smokeless tobacco: Never Used  Vaping Use  . Vaping Use: Never used  Substance and Sexual Activity  . Alcohol use: Yes    Comment: occ  . Drug use: Never  . Sexual activity: Not Currently

## 2020-08-27 ENCOUNTER — Ambulatory Visit: Payer: No Typology Code available for payment source | Admitting: Family Medicine

## 2020-08-27 ENCOUNTER — Encounter: Payer: Self-pay | Admitting: Family Medicine

## 2020-08-27 ENCOUNTER — Other Ambulatory Visit: Payer: Self-pay

## 2020-08-27 VITALS — BP 129/80 | HR 71 | Temp 96.8°F | Resp 20 | Ht 65.5 in | Wt 234.0 lb

## 2020-08-27 DIAGNOSIS — L2089 Other atopic dermatitis: Secondary | ICD-10-CM

## 2020-08-27 MED ORDER — TRIAMCINOLONE ACETONIDE 0.1 % EX CREA: 1.0000 "application " | TOPICAL_CREAM | Freq: Two times a day (BID) | CUTANEOUS | 0 refills | Status: DC

## 2020-08-27 NOTE — Progress Notes (Signed)
BP 129/80   Pulse 71   Temp (!) 96.8 F (36 C)   Resp 20   Ht 5' 5.5" (1.664 m)   Wt 234 lb (106.1 kg)   SpO2 98%   BMI 38.35 kg/m    Subjective:   Patient ID: Sydney [name], female    DOB: 30-Jan-1961, 60 y.o.   MRN: [id number]  HPI: Lyndsi Altic is a 60 y.o. female presenting on 08/27/2020 for Rash (Right arm and left hand )   HPI Patient comes in complaining of a rash on the right antecubital fossa and her left forearm, she says initially she had a rash after Covid 3 weeks ago but then that went away and then now this is come up over the past week.  She says she has been trying to use the hydrocortisone and is not helping as much.  She is using hydroxyzine and it is helping some but not as much.  She denies any fevers or chills or body aches.  She denies any rash spreading anywhere else.  She does admit she has a history of eczema in the past.  Relevant past medical, surgical, family and social history reviewed and updated as indicated. Interim medical history since our last visit reviewed. Allergies and medications reviewed and updated.  Review of Systems  Constitutional: Negative for chills and fever.  Eyes: Negative for visual disturbance.  Respiratory: Negative for chest tightness and shortness of breath.   Cardiovascular: Negative for chest pain and leg swelling.  Musculoskeletal: Negative for back pain and gait problem.  Skin: Positive for rash. Negative for color change and wound.  Neurological: Negative for light-headedness and headaches.  Psychiatric/Behavioral: Negative for agitation and behavioral problems.  All other systems reviewed and are negative.   Per HPI unless specifically indicated above   Allergies as of 08/27/2020      Reactions   Bee Pollen Anaphylaxis, Swelling   Bee Venom Anaphylaxis, Swelling   Butorphanol Other (See Comments)   Not sure told by md she was allergic after surgery.   Meloxicam Anxiety   Mood disorder Altered her personality    Penicillins Anaphylaxis, Hives   Unable to Recall As child mom was told she is highly allergic Did it involve swelling of the face/tongue/throat, SOB, or low BP? Unknown Did it involve sudden or severe rash/hives, skin peeling, or any reaction on the inside of your mouth or nose? Unknown Did you need to seek medical attention at a hospital or doctor's office? Unknown When did it last happen?childhood allergy If all above answers are "NO", may proceed with cephalosporin use.   Prochlorperazine Edisylate Anaphylaxis   Sulfa Antibiotics Anaphylaxis   Unable to Recall   Tramadol Anxiety   Didn't like the way it made her feel   Topiramate Nausea And Vomiting      Medication List       Accurate as of August 27, 2020  2:57 PM. If you have any questions, ask your nurse or doctor.        acetaminophen 500 MG tablet Commonly known as: TYLENOL Take 1,000 mg by mouth every 8 (eight) hours as needed for moderate pain.   albuterol 108 (90 Base) MCG/ACT inhaler Commonly known as: VENTOLIN HFA Inhale 2 puffs into the lungs every 6 (six) hours as needed for wheezing or shortness of breath.   amLODipine 5 MG tablet Commonly known as: NORVASC TAKE 1 TABLET DAILY   calcium carbonate 500 MG chewable tablet Commonly known as: TUMS -  dosed in mg elemental calcium Chew 2 tablets by mouth daily as needed for indigestion or heartburn.   clonazePAM 0.5 MG tablet Commonly known as: KLONOPIN Take 0.5 mg by mouth daily as needed for anxiety.   HYDROcodone-acetaminophen 5-325 MG tablet Commonly known as: NORCO/VICODIN Take 1 tablet by mouth every 4 (four) hours as needed. What changed: reasons to take this   hydrocortisone 2.5 % ointment Apply topically 2 (two) times daily.   hydrOXYzine 25 MG tablet Commonly known as: ATARAX/VISTARIL Take 1 tablet (25 mg total) by mouth 3 (three) times daily as needed.   metoprolol succinate 25 MG 24 hr tablet Commonly known as: TOPROL-XL TAKE 1  TABLET DAILY   sertraline 100 MG tablet Commonly known as: ZOLOFT Take 100 mg by mouth daily.   traZODone 100 MG tablet Commonly known as: DESYREL Take 200 mg by mouth at bedtime.        Objective:   BP 129/80   Pulse 71   Temp (!) 96.8 F (36 C)   Resp 20   Ht 5' 5.5" (1.664 m)   Wt 234 lb (106.1 kg)   SpO2 98%   BMI 38.35 kg/m   Wt Readings from Last 3 Encounters:  08/27/20 234 lb (106.1 kg)  08/16/20 230 lb (104.3 kg)  07/10/20 230 lb (104.3 kg)    Physical Exam Vitals and nursing note reviewed.  Constitutional:      Appearance: Normal appearance.  Skin:    General: Skin is warm and dry.     Findings: Rash (Slight scaly rash on the right antecubital fossa and a few scattered papules on left forearm, consistent with atopic dermatitis.  No erythema or drainage.) present.  Neurological:     Mental Status: She is alert.       Assessment & Plan:   Problem List Items Addressed This Visit   None   Visit Diagnoses    Other atopic dermatitis    -  Primary   Relevant Medications   triamcinolone (KENALOG) 0.1 %      Recommended to double up on her hydroxyzine at night and switch from hydrocortisone cream to triamcinolone cream and to take up some Vernell Leep and use that and take cooler showers and let us know if is not improving. Follow up plan: Return if symptoms worsen or fail to improve.  Counseling provided for all of the vaccine components No orders of the defined types were placed in this encounter.   Arville Care, MD [facility] 08/27/2020, 2:57 PM

## 2020-09-07 ENCOUNTER — Telehealth: Payer: Self-pay | Admitting: *Deleted

## 2020-09-07 ENCOUNTER — Encounter: Payer: Self-pay | Admitting: Family Medicine

## 2020-09-07 ENCOUNTER — Other Ambulatory Visit: Payer: Self-pay

## 2020-09-07 ENCOUNTER — Ambulatory Visit: Payer: No Typology Code available for payment source | Admitting: Family Medicine

## 2020-09-07 VITALS — BP 111/72 | HR 64 | Temp 96.9°F | Ht 65.5 in | Wt 232.0 lb

## 2020-09-07 DIAGNOSIS — M5137 Other intervertebral disc degeneration, lumbosacral region: Secondary | ICD-10-CM

## 2020-09-07 DIAGNOSIS — M5442 Lumbago with sciatica, left side: Secondary | ICD-10-CM

## 2020-09-07 DIAGNOSIS — I10 Essential (primary) hypertension: Secondary | ICD-10-CM | POA: Diagnosis not present

## 2020-09-07 DIAGNOSIS — G8929 Other chronic pain: Secondary | ICD-10-CM

## 2020-09-07 DIAGNOSIS — L309 Dermatitis, unspecified: Secondary | ICD-10-CM

## 2020-09-07 DIAGNOSIS — Z79899 Other long term (current) drug therapy: Secondary | ICD-10-CM

## 2020-09-07 MED ORDER — HYDROCODONE-ACETAMINOPHEN 5-325 MG PO TABS: 1.0000 | ORAL_TABLET | ORAL | 0 refills | Status: DC | PRN

## 2020-09-07 MED ORDER — LIDOCAINE 5 % EX PTCH
1.0000 | MEDICATED_PATCH | CUTANEOUS | 99 refills | Status: DC
Start: 2020-09-07 — End: 2021-01-23

## 2020-09-07 NOTE — Telephone Encounter (Signed)
Lidocaine patches 5% PA started  Key: [id number] Sent to plan

## 2020-09-07 NOTE — Progress Notes (Signed)
Subjective: CC: Follow-up chronic pain PCP: Raliegh Ip, DO BJS:EGBT Fazzino is a 60 y.o. female presenting to clinic today for:  1.  Chronic low back pain Patient with known degenerative changes within the lumbar spine.  She had MRI in October of last year which confirmed.  She is followed by orthopedics but she notes no interventions were offered.  She was advised to lose weight and increase physical activity.  She notes that physical activity is limited by pain.  She describes pain that radiates from the left low back down the left lower extremity to about knee level.  She does have history of a hip surgery on that side as well.  Sometimes she will have pain that radiates into the left buttock and to the groin on the left side.  No reports of fall.  She ambulates with use of cane.  She does have some sensory change in the left thigh but this is been since surgical intervention.  She has Norco on hand for as needed use.  Last prescription was from the fall of last year.  She still has 1 remaining tablet and only uses this medication if severely needed.  She tries to rely on Tylenol or Motrin otherwise.  Denies any constipation, visual auditory hallucinations, respiratory depression or other complications of Norco use.  2. Hypertension Patient is compliant with Norvasc 5 mg daily and metoprolol.  No reports of chest pain, shortness of breath or falls   3.  Rash Patient continues to have some rash along the ankles bilaterally.  She is been applying triamcinolone that was prescribed to her with some improvement.  She has a good skin care regimen with Eucerin and/or CeraVe.  She does report some itching in that area.  ROS: Per HPI  Allergies  Allergen Reactions  . Bee Pollen Anaphylaxis and Swelling  . Bee Venom Anaphylaxis and Swelling  . Butorphanol Other (See Comments)    Not sure told by md she was allergic after surgery.   . Meloxicam Anxiety    Mood disorder Altered her  personality   . Penicillins Anaphylaxis and Hives    Unable to Recall As child mom was told she is highly allergic Did it involve swelling of the face/tongue/throat, SOB, or low BP? Unknown Did it involve sudden or severe rash/hives, skin peeling, or any reaction on the inside of your mouth or nose? Unknown Did you need to seek medical attention at a hospital or doctor's office? Unknown When did it last happen?childhood allergy If all above answers are "NO", may proceed with cephalosporin use.   Marland Kitchen Prochlorperazine Edisylate Anaphylaxis  . Sulfa Antibiotics Anaphylaxis    Unable to Recall  . Tramadol Anxiety    Didn't like the way it made her feel   . Topiramate Nausea And Vomiting   Past Medical History:  Diagnosis Date  . Allergy   . Anemia    history of  . Anxiety   . Arthritis   . Asthma   . Atherosclerosis   . Depression   . Family history of adverse reaction to anesthesia    pt's mother felt everything and couldn't speak during a gallbladder surgery  . GERD (gastroesophageal reflux disease)   . History of hiatal hernia   . History of kidney stones   . Hypertension   . Pneumonia    a few times  . TIA (transient ischemic attack) 2012    Current Outpatient Medications:  .  acetaminophen (TYLENOL) 500 MG  tablet, Take 1,000 mg by mouth every 8 (eight) hours as needed for moderate pain., Disp: , Rfl:  .  albuterol (VENTOLIN HFA) 108 (90 Base) MCG/ACT inhaler, Inhale 2 puffs into the lungs every 6 (six) hours as needed for wheezing or shortness of breath., Disp: , Rfl:  .  amLODipine (NORVASC) 5 MG tablet, TAKE 1 TABLET DAILY (Patient taking differently: Take 5 mg by mouth daily.), Disp: 90 tablet, Rfl: 2 .  clonazePAM (KLONOPIN) 0.5 MG tablet, Take 0.5 mg by mouth daily as needed for anxiety., Disp: , Rfl:  .  HYDROcodone-acetaminophen (NORCO/VICODIN) 5-325 MG tablet, Take 1 tablet by mouth every 4 (four) hours as needed. (Patient taking differently: Take 1 tablet by  mouth every 4 (four) hours as needed for severe pain.), Disp: 20 tablet, Rfl: 0 .  hydrocortisone 2.5 % ointment, Apply topically 2 (two) times daily., Disp: 30 g, Rfl: 0 .  hydrOXYzine (ATARAX/VISTARIL) 25 MG tablet, Take 1 tablet (25 mg total) by mouth 3 (three) times daily as needed., Disp: 30 tablet, Rfl: 0 .  metoprolol succinate (TOPROL-XL) 25 MG 24 hr tablet, TAKE 1 TABLET DAILY (Patient taking differently: Take 25 mg by mouth daily.), Disp: 90 tablet, Rfl: 2 .  sertraline (ZOLOFT) 100 MG tablet, Take 100 mg by mouth daily. , Disp: , Rfl:  .  traZODone (DESYREL) 100 MG tablet, Take 200 mg by mouth at bedtime. , Disp: , Rfl:  .  triamcinolone (KENALOG) 0.1 %, Apply 1 application topically 2 (two) times daily., Disp: 80 g, Rfl: 0 Social History   Socioeconomic History  . Marital status: Married    Spouse name: Not on file  . Number of children: 3  . Years of education: Not on file  . Highest education level: Not on file  Occupational History  . Not on file  Tobacco Use  . Smoking status: Current Every Day Smoker    Packs/day: 0.50    Years: 23.00    Pack years: 11.50    Types: Cigarettes  . Smokeless tobacco: Never Used  Vaping Use  . Vaping Use: Never used  Substance and Sexual Activity  . Alcohol use: Yes    Comment: occ  . Drug use: Never  . Sexual activity: Not Currently  Other Topics Concern  . Not on file  Social History Narrative   Recently relocated to [place] from [place].  She resides at home with her husband.   She has 3 children but 1 passed away from cancer.   Social Determinants of Health   Financial Resource Strain: Not on file  Food Insecurity: Not on file  Transportation Needs: Not on file  Physical Activity: Not on file  Stress: Not on file  Social Connections: Not on file  Intimate Partner Violence: Not on file   Family History  Problem Relation Age of Onset  . Anxiety disorder Mother   . Depression Mother   . Heart disease Mother   .  Hypertension Mother   . Arrhythmia Mother   . Cancer Father        colon, age greater than 24  . Lung cancer Father   . Migraines Sister   . Alcohol abuse Brother   . Cancer Daughter        nerve sheath sarcoma    Objective: Office vital signs reviewed. BP 111/72   Pulse 64   Temp (!) 96.9 F (36.1 C) (Temporal)   Ht 5' 5.5" (1.664 m)   Wt 232 lb (105.2  kg)   SpO2 97%   BMI 38.02 kg/m   Physical Examination:  General: Awake, alert, well nourished, No acute distress HEENT: Normal, sclera white, MMM Cardio: regular rate and rhythm, S1S2 heard, no murmurs appreciated Pulm: clear to auscultation bilaterally, no wheezes, rhonchi or rales; normal work of breathing on room air Extremities: warm, well perfused, No edema, cyanosis or clubbing; +2 pulses bilaterally MSK: antalgic gait and station; utilizes cane for ambulation  Lumbar spine: Exquisitely tender to palpation over the SI/lumbosacral joint on the left.  There are no palpable abnormalities in this region.  No midline tenderness palpation.  No paraspinal muscle tenderness palpation otherwise. Skin: Area of hyperpigmentation noted along the medial ankles.  She has some scaly skin in this area as well.  Assessment/ Plan: 60 y.o. female   Essential hypertension  DDD (degenerative disc disease), lumbosacral - Plan: ToxASSURE Select 13 (MW), Urine, HYDROcodone-acetaminophen (NORCO/VICODIN) 5-325 MG tablet, lidocaine (LIDODERM) 5 %  Chronic left-sided low back pain with left-sided sciatica - Plan: ToxASSURE Select 13 (MW), Urine, HYDROcodone-acetaminophen (NORCO/VICODIN) 5-325 MG tablet, lidocaine (LIDODERM) 5 %  Controlled substance agreement signed - Plan: ToxASSURE Select 13 (MW), Urine  Dermatitis  Blood pressures well controlled.  Continue current regimen  Controlled substance contract and UDS obtained as per office policy for ongoing Norco prescription.  I reviewed the national narcotic database and there were no red  flags.  I will be glad to take over this medication going forward but have advised her to continue following up with orthopedics for ongoing nonopioid interventions.  I wonder if she would be a good candidate for injections.  I reviewed her MRI results.  Continue appropriate skin care and triamcinolone cream as needed.  Could consider advancing to more potent corticosteroid if needed going forward.  Follow-up in 3 months, sooner if needed  No orders of the defined types were placed in this encounter.  No orders of the defined types were placed in this encounter.    Raliegh Ip, DO [facility] [phone]

## 2020-09-10 NOTE — Telephone Encounter (Signed)
[facility] received a request for coverage of Lidocaine Patch 5% for you. This is the initial adverse determination for this request. The request was denied because: Coverage for this medication is denied for the following reason(s). We reviewed the information we received about your condition and circumstances. We used the plan approved policy when making this decision. The policy states that this medication may be approved when being used for any of the following: -Pain associated with post-herpetic neuralgia -Pain associated with diabetic neuropathy -Pain associated with cancer-related neuropathy (including cancer treatment-related neuropathy)

## 2020-09-11 LAB — TOXASSURE SELECT 13 (MW), URINE

## 2020-09-13 ENCOUNTER — Ambulatory Visit (INDEPENDENT_AMBULATORY_CARE_PROVIDER_SITE_OTHER): Payer: No Typology Code available for payment source | Admitting: Orthopaedic Surgery

## 2020-09-13 ENCOUNTER — Ambulatory Visit: Payer: Self-pay

## 2020-09-13 ENCOUNTER — Ambulatory Visit (INDEPENDENT_AMBULATORY_CARE_PROVIDER_SITE_OTHER): Payer: No Typology Code available for payment source

## 2020-09-13 ENCOUNTER — Encounter: Payer: Self-pay | Admitting: Orthopaedic Surgery

## 2020-09-13 ENCOUNTER — Other Ambulatory Visit: Payer: Self-pay

## 2020-09-13 VITALS — Ht 65.0 in | Wt 231.0 lb

## 2020-09-13 DIAGNOSIS — M79605 Pain in left leg: Secondary | ICD-10-CM

## 2020-09-13 DIAGNOSIS — M7062 Trochanteric bursitis, left hip: Secondary | ICD-10-CM | POA: Insufficient documentation

## 2020-09-13 DIAGNOSIS — M545 Low back pain, unspecified: Secondary | ICD-10-CM

## 2020-09-13 MED ORDER — METHYLPREDNISOLONE ACETATE 40 MG/ML IJ SUSP
40.0000 mg | INTRAMUSCULAR | Status: AC | PRN
  Administered 2020-09-13: 40 mg via INTRA_ARTICULAR

## 2020-09-13 MED ORDER — BUPIVACAINE HCL 0.25 % IJ SOLN
2.0000 mL | INTRAMUSCULAR | Status: AC | PRN
  Administered 2020-09-13: 2 mL via INTRA_ARTICULAR

## 2020-09-13 MED ORDER — LIDOCAINE HCL 1 % IJ SOLN
0.5000 mL | INTRAMUSCULAR | Status: AC | PRN
  Administered 2020-09-13: .5 mL

## 2020-09-13 NOTE — Progress Notes (Addendum)
Office Visit Note   Patient: Sydney [name]           Date of Birth: 12-04-1960           MRN: [id number] Visit Date: 09/13/2020              Requested by: Raliegh Ip, DO [address] [place],  [place] [address] PCP: Raliegh Ip, DO   Assessment & Plan: Visit Diagnoses:  1. Acute left-sided low back pain, unspecified whether sciatica present   2. Pain in left leg   3. Trochanteric bursitis, left hip     Plan: Left trochanteric injection performed she states she had significant relief and could ambulate without pain and can sit without pain.  She has had to walk with a cane since November and if her pain is better after few weeks she can call about setting up some therapy to work on strengthening with the goal of getting her off her single-point cane.  Follow-Up Instructions: Return if symptoms worsen or fail to improve.   Orders:  Orders Placed This Encounter  Procedures   Large Joint Inj: L greater trochanter   XR Pelvis 1-2 Views   XR Lumbar Spine 2-3 Views   No orders of the defined types were placed in this encounter.     Procedures: Large Joint Inj: L greater trochanter on 09/13/2020 11:06 AM Details: lateral approach Medications: 0.5 mL lidocaine 1 %; 2 mL bupivacaine 0.25 %; 40 mg methylPREDNISolone acetate 40 MG/ML      Clinical Data: No additional findings.   Subjective: Chief Complaint  Patient presents with   Lower Back - Pain   Left Leg - Pain    HPI patient returns states she was walking how the large pop that occurred in the buttocks.  She is having pain in the lumbosacral junction radiates in the left buttocks around over the left trochanter toward the left groin.  No numbness or tingling below the knee.  She has trouble sitting she points to the trochanter area.  She had been doing some leg lifts which did not seem to help.  Previous fall was in November with superior rami fracture.  Review of Systems previous left total hip  arthroplasty.  Known scoliosis lumbar disc disc degeneration without anterolisthesis all other systems noncontributory.   Objective: Vital Signs: Ht 5\' 5"  (1.651 m)   Wt 231 lb (104.8 kg)   BMI 38.44 kg/m   Physical Exam Constitutional:      Appearance: She is well-developed.  HENT:     Head: Normocephalic.     Right Ear: External ear normal.     Left Ear: External ear normal.  Eyes:     Pupils: Pupils are equal, round, and reactive to light.  Neck:     Thyroid: No thyromegaly.     Trachea: No tracheal deviation.  Cardiovascular:     Rate and Rhythm: Normal rate.  Pulmonary:     Effort: Pulmonary effort is normal.  Abdominal:     Palpations: Abdomen is soft.  Skin:    General: Skin is warm and dry.  Neurological:     Mental Status: She is alert and oriented to person, place, and time.  Psychiatric:        Mood and Affect: Mood and affect normal.        Behavior: Behavior normal.     Ortho Exam lumbar curvature minimal sciatic notch tenderness.  Exquisite tenderness over the left greater trochanter.  Negative logroll to the hips.  Ankle dorsiflexion plantarflexion is intact she is amatory with a left hip limp. Specialty Comments:  No specialty comments available.  Imaging: XR Pelvis 1-2 Views  Result Date: 09/13/2020 [facility] pelvis x-ray demonstrates previous total of arthroplasties.  Healed left superior rami fracture.  Previous tacks noted from pelvic tack up procedure unchanged from 2021 images. Impression: Negative for new fractures post fall.  Healed left superior rami fracture.    PMFS History: Patient Active Problem List   Diagnosis Date Noted   Trochanteric bursitis, left hip 09/13/2020   Closed fracture of multiple pubic rami, left, sequela 08/16/2020   H/O adenomatous polyp of colon 05/23/2020   FH: colon cancer in relative diagnosed at >48 years old 05/23/2020   Spinal stenosis of lumbar region 05/03/2020   Impingement syndrome of right shoulder 05/03/2020    Other secondary scoliosis, lumbar region 03/01/2020   H/O total hip arthroplasty 12/06/2019   Arthritis of right hip 12/05/2019   Unilateral primary osteoarthritis, right hip 11/17/2019   Status post total replacement of left hip 09/15/2019   Back pain 07/01/2018   DDD (degenerative disc disease), lumbosacral 07/01/2018   Venous insufficiency of both lower extremities 11/17/2016   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 level with radiculopathy 08/18/2016   Morbid obesity with BMI of 40.0-44.9, adult (HCC) 07/28/2016   Chronic pain syndrome 10/24/2015   Dysfunction of both eustachian tubes 07/26/2014   Calculus of left kidney 04/25/2014   Impaired fasting glucose 04/05/2014   History of bladder surgery 04/03/2014   History of hysterectomy 04/03/2014   Urge incontinence of urine 04/03/2014   Asthma 07/28/2013   Nontoxic uninodular goiter 07/28/2013   Solitary pulmonary nodule 07/28/2013   Lumbar radiculopathy 04/20/2013   Insomnia 03/22/2013   Essential hypertension 08/18/2011   Tobacco use disorder 08/18/2011   Moderate episode of recurrent major depressive disorder (HCC) 04/03/2011   Other B-complex deficiencies 08/16/2010   Family history of cerebrovascular accident (CVA) 04/17/2010   Family history of malignant neoplasm of gastrointestinal tract 04/17/2010   Family history of other cardiovascular diseases(V17.49) 04/17/2010   Past Medical History:  Diagnosis Date   Allergy    Anemia    history of   Anxiety    Arthritis    Asthma    Atherosclerosis    Depression    Family history of adverse reaction to anesthesia    pt's mother felt everything and couldn't speak during a gallbladder surgery   GERD (gastroesophageal reflux disease)    History of hiatal hernia    History of kidney stones    Hypertension    Pneumonia    a few times   TIA (transient ischemic attack) 2012    Family History  Problem Relation Age of Onset   Anxiety disorder Mother    Depression Mother     Heart disease Mother    Hypertension Mother    Arrhythmia Mother    Cancer Father        colon, age greater than 34   Lung cancer Father    Migraines Sister    Alcohol abuse Brother    Cancer Daughter        nerve sheath sarcoma    Past Surgical History:  Procedure Laterality Date   ABDOMINAL HYSTERECTOMY     abdominal   APPENDECTOMY     bladder tack     CARPAL TUNNEL RELEASE Bilateral    CERVICAL SPINE SURGERY     5-6, 6-7   CHOLECYSTECTOMY  COLONOSCOPY WITH PROPOFOL N/A 07/10/2020   Procedure: COLONOSCOPY WITH PROPOFOL;  Surgeon: Lanelle Bal, DO;  Location: [facility];  Service: Endoscopy;  Laterality: N/A;  12:15pm   HAMMER TOE SURGERY Right    JOINT REPLACEMENT     TOTAL HIP 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   Procedure: LEFT TOTAL HIP ARTHROPLASTY -DIRECT ANTERIOR;  Surgeon: Eldred Manges, MD;  Location: [facility] OR;  Service: Orthopedics;  Laterality: Left;   TOTAL HIP ARTHROPLASTY Right 12/05/2019   Procedure: RIGHT TOTAL HIP ARTHROPLASTY ANTERIOR APPROACH  DIRECT ANTERIOR;  Surgeon: Eldred Manges, MD;  Location: [facility] OR;  Service: Orthopedics;  Laterality: Right;   Social History   Occupational History   Not on file  Tobacco Use   Smoking status: Current Every Day Smoker    Packs/day: 0.50    Years: 23.00    Pack years: 11.50    Types: Cigarettes   Smokeless tobacco: Never Used  Vaping Use   Vaping Use: Never used  Substance and Sexual Activity   Alcohol use: Yes    Comment: occ   Drug use: Never   Sexual activity: Not Currently

## 2020-10-01 ENCOUNTER — Other Ambulatory Visit: Payer: Self-pay | Admitting: Family Medicine

## 2020-10-01 ENCOUNTER — Telehealth: Payer: Self-pay

## 2020-10-01 DIAGNOSIS — F32A Depression, unspecified: Secondary | ICD-10-CM

## 2020-10-01 DIAGNOSIS — F329 Major depressive disorder, single episode, unspecified: Secondary | ICD-10-CM

## 2020-10-01 MED ORDER — SERTRALINE HCL 100 MG PO TABS: 100.0000 mg | ORAL_TABLET | Freq: Every day | ORAL | 3 refills | Status: DC

## 2020-10-01 NOTE — Telephone Encounter (Signed)
Done

## 2020-10-01 NOTE — Telephone Encounter (Signed)
  Prescription Request  10/01/2020  What is the name of the medication or equipment? Sertraline  Have you contacted your pharmacy to request a refill? (if applicable) Yes  Which pharmacy would you like this sent to? [facility]  Pt said when she had visit with Dr Nadine Counts on 09/07/20 Dr Nadine Counts approved to start prescribing her the Sertraline 100mg  Rx since pt no longer sees the prescriber anymore. Pt says she has about 8-9 pills left and needs refills sent to [facility].

## 2020-10-03 ENCOUNTER — Ambulatory Visit: Payer: No Typology Code available for payment source

## 2020-10-08 ENCOUNTER — Other Ambulatory Visit: Payer: Self-pay

## 2020-10-08 ENCOUNTER — Ambulatory Visit: Payer: No Typology Code available for payment source | Admitting: Family

## 2020-10-08 ENCOUNTER — Encounter: Payer: Self-pay | Admitting: Family

## 2020-10-08 VITALS — BP 124/67 | HR 66 | Temp 98.1°F | Ht 65.0 in | Wt 235.2 lb

## 2020-10-08 DIAGNOSIS — L0390 Cellulitis, unspecified: Secondary | ICD-10-CM | POA: Diagnosis not present

## 2020-10-08 DIAGNOSIS — R21 Rash and other nonspecific skin eruption: Secondary | ICD-10-CM | POA: Diagnosis not present

## 2020-10-08 MED ORDER — DOXYCYCLINE HYCLATE 100 MG PO TABS: 100.0000 mg | ORAL_TABLET | Freq: Two times a day (BID) | ORAL | 0 refills | Status: DC

## 2020-10-08 MED ORDER — TRIAMCINOLONE ACETONIDE 0.5 % EX OINT: 1.0000 "application " | TOPICAL_OINTMENT | Freq: Two times a day (BID) | CUTANEOUS | 0 refills | Status: DC

## 2020-10-08 NOTE — Patient Instructions (Signed)

## 2020-10-08 NOTE — Progress Notes (Signed)
Subjective:    Patient ID: Sydney [name], female    DOB: 1961-03-17, 60 y.o.   MRN: [id number]  Chief Complaint  Patient presents with  . Rash    Left foot started when had COVID in January. Itches, usues derma plast for itching . Has been rx creams that does not help. It is very tender    Pt presents to the office today with recurrent rash of left medial ankle. She reports she had COVID in January and had a COVID rash on bilateral legs. However, over the last 2-3 days it has become increased redness, swelling, tenderness.  Rash This is a new problem. The current episode started more than 1 month ago. The problem has been gradually worsening since onset. The affected locations include the left ankle. The rash is characterized by burning, itchiness and pain. She was exposed to nothing. Pertinent negatives include no cough, diarrhea, eye pain, fatigue or fever. Past treatments include moisturizer and anti-itch cream. The treatment provided no relief.      Review of Systems  Constitutional: Negative for fatigue and fever.  Eyes: Negative for pain.  Respiratory: Negative for cough.   Gastrointestinal: Negative for diarrhea.  Skin: Positive for rash.  All other systems reviewed and are negative.      Objective:   Physical Exam Vitals reviewed.  Constitutional:      General: She is not in acute distress.    Appearance: She is well-developed.  HENT:     Head: Normocephalic and atraumatic.  Eyes:     Pupils: Pupils are equal, round, and reactive to light.  Neck:     Thyroid: No thyromegaly.  Cardiovascular:     Rate and Rhythm: Normal rate and regular rhythm.     Heart sounds: Normal heart sounds. No murmur heard.   Pulmonary:     Effort: Pulmonary effort is normal. No respiratory distress.     Breath sounds: Normal breath sounds. No wheezing.  Abdominal:     General: Bowel sounds are normal. There is no distension.     Palpations: Abdomen is soft.     Tenderness: There is  no abdominal tenderness.  Musculoskeletal:        General: No tenderness. Normal range of motion.     Cervical back: Normal range of motion and neck supple.  Skin:    General: Skin is warm and dry.     Findings: Rash present.     Comments: Erythemas cracked rash on medial left ankle approx 6X7 cm, slightly warm, trace swelling, and tenderness with palpation   Neurological:     Mental Status: She is alert and oriented to person, place, and time.     Cranial Nerves: No cranial nerve deficit.     Deep Tendon Reflexes: Reflexes are normal and symmetric.  Psychiatric:        Behavior: Behavior normal.        Thought Content: Thought content normal.        Judgment: Judgment normal.            BP 124/67   Pulse 66   Temp 98.1 F (36.7 C) (Temporal)   Ht 5\' 5"  (1.651 m)   Wt 235 lb 3.2 oz (106.7 kg)   BMI 39.14 kg/m   Assessment & Plan:  Sydney [name] comes in today with chief complaint of Rash (Left foot started when had COVID in January. Itches, usues derma plast for itching . Has been rx creams that does not help.  It is very tender )   Diagnosis and orders addressed:  1. Cellulitis, unspecified cellulitis site PT has hx of anaphylaxis with penicillin and Bactrim Report increased redness, fever, discharge, or pain Rest Keep elevated when needed - triamcinolone ointment (KENALOG) 0.5 %; Apply 1 application topically 2 (two) times daily.  Dispense: 60 g; Refill: 0 - doxycycline (VIBRA-TABS) 100 MG tablet; Take 1 tablet (100 mg total) by mouth 2 (two) times daily.  Dispense: 20 tablet; Refill: 0  2. Rash and nonspecific skin eruption - triamcinolone ointment (KENALOG) 0.5 %; Apply 1 application topically 2 (two) times daily.  Dispense: 60 g; Refill: 0   Jannifer Rodney, FNP

## 2020-10-10 ENCOUNTER — Other Ambulatory Visit: Payer: Self-pay | Admitting: Family

## 2020-10-10 MED ORDER — ONDANSETRON HCL 4 MG PO TABS: 4.0000 mg | ORAL_TABLET | Freq: Every day | ORAL | 1 refills | Status: DC | PRN

## 2020-10-18 ENCOUNTER — Other Ambulatory Visit: Payer: Self-pay | Admitting: Family Medicine

## 2020-10-18 DIAGNOSIS — Z1231 Encounter for screening mammogram for malignant neoplasm of breast: Secondary | ICD-10-CM

## 2020-10-29 ENCOUNTER — Ambulatory Visit (INDEPENDENT_AMBULATORY_CARE_PROVIDER_SITE_OTHER): Payer: No Typology Code available for payment source | Admitting: Nurse Practitioner

## 2020-10-29 ENCOUNTER — Encounter: Payer: Self-pay | Admitting: Nurse Practitioner

## 2020-10-29 VITALS — Temp 99.8°F

## 2020-10-29 DIAGNOSIS — J029 Acute pharyngitis, unspecified: Secondary | ICD-10-CM | POA: Diagnosis not present

## 2020-10-29 LAB — RAPID STREP SCREEN (MED CTR MEBANE ONLY): Strep Gp A Ag, IA W/Reflex: NEGATIVE

## 2020-10-29 LAB — CULTURE, GROUP A STREP

## 2020-10-29 MED ORDER — DM-GUAIFENESIN ER 30-600 MG PO TB12
1.0000 | ORAL_TABLET | Freq: Two times a day (BID) | ORAL | 0 refills | Status: DC
Start: 2020-10-29 — End: 2020-11-16

## 2020-10-29 MED ORDER — AZITHROMYCIN 250 MG PO TABS
ORAL_TABLET | ORAL | 0 refills | Status: DC
Start: 2020-10-29 — End: 2020-11-16

## 2020-10-29 NOTE — Progress Notes (Signed)
   Virtual Visit  Note Due to COVID-19 pandemic this visit was conducted virtually. This visit type was conducted due to national recommendations for restrictions regarding the COVID-19 Pandemic (e.g. social distancing, sheltering in place) in an effort to limit this patient's exposure and mitigate transmission in our community. All issues noted in this document were discussed and addressed.  A physical exam was not performed with this format.  I connected with Sydney [name] on 10/29/20 at 11:10 am by telephone and verified that I am speaking with the correct person using two identifiers. Sydney [name] is currently located at home during visit. The provider, Daryll Drown, NP is located in their office at time of visit.  I discussed the limitations, risks, security and privacy concerns of performing an evaluation and management service by telephone and the availability of in person appointments. I also discussed with the patient that there may be a patient responsible charge related to this service. The patient expressed understanding and agreed to proceed.   History and Present Illness:  Sore Throat  This is a new problem. The current episode started in the past 7 days. The problem has been gradually worsening. Neither side of throat is experiencing more pain than the other. The maximum temperature recorded prior to her arrival was 101 - 101.9 F. The fever has been present for less than 1 day. The pain is moderate. Associated symptoms include coughing and swollen glands. Pertinent negatives include no abdominal pain, congestion, shortness of breath or trouble swallowing. Exposure to: sick family. She has tried nothing for the symptoms.      Review of Systems  Constitutional: Positive for fever. Negative for chills and malaise/fatigue.  HENT: Negative for congestion and trouble swallowing.   Respiratory: Positive for cough. Negative for shortness of breath.   Gastrointestinal: Negative for  abdominal pain.  Genitourinary: Negative.   Musculoskeletal: Negative.   All other systems reviewed and are negative.    Observations/Objective: Tele-visit patient does not sound to be in distress. Assessment and Plan:  Sore throat Worsening symptoms of sore throat, swollen glands, white patches in throat, cough, low-grade fever 99.8, irritated and scratchy throat, with eye discharge in the last 7 days.  Patient reports being around sick family where everyone had the same symptoms. Ordered strep test and COVID-19 test results pending.  Patient unable to take Augmentin due to allergies. Z-Pak ordered for patient, guaifenesin for cough and congestion.  Tylenol for headache/fever.  Follow-up with worsening unresolved symptoms. Rx sent to pharmacy.    Follow Up Instructions: Follow-up with worsening unresolved symptoms.   I discussed the assessment and treatment plan with the patient. The patient was provided an opportunity to ask questions and all were answered. The patient agreed with the plan and demonstrated an understanding of the instructions.   The patient was advised to call back or seek an in-person evaluation if the symptoms worsen or if the condition fails to improve as anticipated.  The above assessment and management plan was discussed with the patient. The patient verbalized understanding of and has agreed to the management plan. Patient is aware to call the clinic if symptoms persist or worsen. Patient is aware when to return to the clinic for a follow-up visit. Patient educated on when it is appropriate to go to the emergency department.   Time call ended: 11:20 AM  I provided 10 minutes of  non face-to-face time during this encounter.    Daryll Drown, NP

## 2020-10-29 NOTE — Assessment & Plan Note (Signed)
Worsening symptoms of sore throat, swollen glands, white patches in throat, cough, low-grade fever 99.8, irritated and scratchy throat, with eye discharge in the last 7 days.  Patient reports being around sick family where everyone had the same symptoms. Ordered strep test and COVID-19 test results pending.  Patient unable to take Augmentin due to allergies. Z-Pak ordered for patient, guaifenesin for cough and congestion.  Tylenol for headache/fever.  Follow-up with worsening unresolved symptoms. Rx sent to pharmacy.

## 2020-10-30 LAB — NOVEL CORONAVIRUS, NAA: SARS-CoV-2, NAA: NOT DETECTED

## 2020-10-30 LAB — SARS-COV-2, NAA 2 DAY TAT

## 2020-11-16 ENCOUNTER — Encounter: Payer: Self-pay | Admitting: Nurse Practitioner

## 2020-11-16 ENCOUNTER — Ambulatory Visit: Payer: No Typology Code available for payment source | Admitting: Nurse Practitioner

## 2020-11-16 ENCOUNTER — Other Ambulatory Visit: Payer: Self-pay

## 2020-11-16 VITALS — BP 141/87 | HR 78 | Temp 97.7°F | Resp 20 | Ht 65.0 in | Wt 231.0 lb

## 2020-11-16 DIAGNOSIS — H6501 Acute serous otitis media, right ear: Secondary | ICD-10-CM | POA: Diagnosis not present

## 2020-11-16 DIAGNOSIS — J039 Acute tonsillitis, unspecified: Secondary | ICD-10-CM

## 2020-11-16 MED ORDER — AZITHROMYCIN 250 MG PO TABS: ORAL_TABLET | ORAL | 0 refills | Status: DC

## 2020-11-16 NOTE — Patient Instructions (Signed)
Otitis Media, Adult  Otitis media is a condition in which the middle ear is red and swollen (inflamed) and full of fluid. The middle ear is the part of the ear that contains bones for hearing as well as air that helps send sounds to the brain. The condition usually goes away on its own. What are the causes? This condition is caused by a blockage in the eustachian tube. The eustachian tube connects the middle ear to the back of the nose. It normally allows air into the middle ear. The blockage is caused by fluid or swelling. Problems that can cause blockage include:  A cold or infection that affects the nose, mouth, or throat.  Allergies.  An irritant, such as tobacco smoke.  Adenoids that have become large. The adenoids are soft tissue located in the back of the throat, behind the nose and the roof of the mouth.  Growth or swelling in the upper part of the throat, just behind the nose (nasopharynx).  Damage to the ear caused by change in pressure. This is called barotrauma. What are the signs or symptoms? Symptoms of this condition include:  Ear pain.  Fever.  Problems with hearing.  Being tired.  Fluid leaking from the ear.  Ringing in the ear. How is this treated? This condition can go away on its own within 3-5 days. But if the condition is caused by bacteria or does not go away on its own, or if it keeps coming back, your doctor may:  Give you antibiotic medicines.  Give you medicines for pain. Follow these instructions at home:  Take over-the-counter and prescription medicines only as told by your doctor.  If you were prescribed an antibiotic medicine, take it as told by your doctor. Do not stop taking the antibiotic even if you start to feel better.  Keep all follow-up visits as told by your doctor. This is important. Contact a doctor if:  You have bleeding from your nose.  There is a lump on your neck.  You are not feeling better in 5 days.  You feel worse  instead of better. Get help right away if:  You have pain that is not helped with medicine.  You have swelling, redness, or pain around your ear.  You get a stiff neck.  You cannot move part of your face (paralysis).  You notice that the bone behind your ear hurts when you touch it.  You get a very bad headache. Summary  Otitis media means that the middle ear is red, swollen, and full of fluid.  This condition usually goes away on its own.  If the problem does not go away, treatment may be needed. You may be given medicines to treat the infection or to treat your pain.  If you were prescribed an antibiotic medicine, take it as told by your doctor. Do not stop taking the antibiotic even if you start to feel better.  Keep all follow-up visits as told by your doctor. This is important. This information is not intended to replace advice given to you by your health care provider. Make sure you discuss any questions you have with your health care provider. Document Revised: 06/09/2019 Document Reviewed: 06/09/2019 Elsevier Patient Education  2021 [employer]  

## 2020-11-16 NOTE — Progress Notes (Signed)
   Subjective:    Patient ID: Sydney [name], female    DOB: 1960/12/15, 60 y.o.   MRN: [id number]   Chief Complaint: Ear Pain (Hurts to swallow/)   HPI Patient come sin c/o right ear pain that radiates down her jaw. She is having right tonsillar pain  And swollen lymph nodes on that side.   Review of Systems  Constitutional: Negative for chills and fever.  HENT: Positive for ear pain (right) and sore throat.   Respiratory: Negative.   Cardiovascular: Negative.   Genitourinary: Negative.   Neurological: Negative.   Psychiatric/Behavioral: Negative.   All other systems reviewed and are negative.      Objective:   Physical Exam Vitals and nursing note reviewed.  Constitutional:      Appearance: Normal appearance. She is obese.  HENT:     Right Ear: A middle ear effusion is present. Tympanic membrane is erythematous and bulging.     Left Ear: Tympanic membrane normal.     Mouth/Throat:     Mouth: Mucous membranes are moist.     Pharynx: Posterior oropharyngeal erythema (right side only) present.     Tonsils: No tonsillar exudate.  Cardiovascular:     Rate and Rhythm: Normal rate and regular rhythm.     Heart sounds: Normal heart sounds.  Pulmonary:     Effort: Pulmonary effort is normal.     Breath sounds: Normal breath sounds.  Skin:    General: Skin is warm.  Neurological:     General: No focal deficit present.     Mental Status: She is alert and oriented to person, place, and time.  Psychiatric:        Mood and Affect: Mood normal.        Behavior: Behavior normal.    BP (!) 141/87   Pulse 78   Temp 97.7 F (36.5 C) (Temporal)   Resp 20   Ht 5\' 5"  (1.651 m)   Wt 231 lb (104.8 kg)   SpO2 96%   BMI 38.44 kg/m       Assessment & Plan:  Sydney [name] in today with chief complaint of Ear Pain (Hurts to swallow/)   1. Non-recurrent acute serous otitis media of right ear - azithromycin (ZITHROMAX Z-PAK) 250 MG tablet; As directed  Dispense: 6 tablet;  Refill: 0  2. Tonsillitis Force fluids Motrin or tylenol as need OTC for fever or pain RTO prn - azithromycin (ZITHROMAX Z-PAK) 250 MG tablet; As directed  Dispense: 6 tablet; Refill: 0    The above assessment and management plan was discussed with the patient. The patient verbalized understanding of and has agreed to the management plan. Patient is aware to call the clinic if symptoms persist or worsen. Patient is aware when to return to the clinic for a follow-up visit. Patient educated on when it is appropriate to go to the emergency department.   [name] [name], FNP

## 2020-12-05 ENCOUNTER — Ambulatory Visit: Payer: No Typology Code available for payment source | Admitting: Family Medicine

## 2020-12-11 ENCOUNTER — Encounter: Payer: Self-pay | Admitting: Family Medicine

## 2020-12-11 ENCOUNTER — Ambulatory Visit: Payer: No Typology Code available for payment source | Admitting: Family Medicine

## 2020-12-11 ENCOUNTER — Other Ambulatory Visit: Payer: Self-pay

## 2020-12-11 VITALS — BP 128/80 | HR 65 | Temp 97.3°F | Ht 65.0 in | Wt 232.6 lb

## 2020-12-11 DIAGNOSIS — M5442 Lumbago with sciatica, left side: Secondary | ICD-10-CM

## 2020-12-11 DIAGNOSIS — G8929 Other chronic pain: Secondary | ICD-10-CM

## 2020-12-11 DIAGNOSIS — M5137 Other intervertebral disc degeneration, lumbosacral region: Secondary | ICD-10-CM | POA: Diagnosis not present

## 2020-12-11 DIAGNOSIS — E7800 Pure hypercholesterolemia, unspecified: Secondary | ICD-10-CM

## 2020-12-11 DIAGNOSIS — F5104 Psychophysiologic insomnia: Secondary | ICD-10-CM | POA: Diagnosis not present

## 2020-12-11 MED ORDER — TRAZODONE HCL 100 MG PO TABS: 100.0000 mg | ORAL_TABLET | Freq: Every evening | ORAL | 1 refills | Status: DC | PRN

## 2020-12-11 MED ORDER — HYDROCODONE-ACETAMINOPHEN 5-325 MG PO TABS: 1.0000 | ORAL_TABLET | ORAL | 0 refills | Status: DC | PRN

## 2020-12-11 NOTE — Progress Notes (Signed)
Subjective: CC: Chronic pain/ HLD PCP: Sydney Norlander, DO [id number] [name] is a 60 y.o. female presenting to clinic today for:  1.  Chronic pain, DDD of L spine Patient is treated with as needed Norco.  Last refill February 2022.  She has about 3 tablets left.  Her back has been hurting her little bit more than normal because her child just had a new infant baby in [place] and she has been helping with the 2 older children.  She had 1 mechanical stumble but not an overt fall.  She did not take the medication on that day and in fact only takes at bedtime when needed.  Denies any dizziness, constipation, visual or auditory hallucinations.  No respiratory depression.  2.  Hyperlipidemia Not currently treated with statin.  ASCVD noted to be elevated previously.  3.  Insomnia Patient reports fairly good sleep with up to 200 mg of trazodone at nighttime.  Sometimes she only takes 100 mg and sometimes she takes none depending on her fatigability that evening.  She also takes Zoloft 100 mg but denies any concerning symptoms or signs of serotonin syndrome and has been stable on this regimen for over 7 years now.  ROS: Per HPI  Allergies  Allergen Reactions  . Bee Pollen Anaphylaxis and Swelling  . Bee Venom Anaphylaxis and Swelling  . Butorphanol Other (See Comments)    Not sure told by md she was allergic after surgery.   . Meloxicam Anxiety    Mood disorder Altered her personality   . Penicillins Anaphylaxis and Hives    Unable to Recall As child mom was told she is highly allergic Did it involve swelling of the face/tongue/throat, SOB, or low BP? Unknown Did it involve sudden or severe rash/hives, skin peeling, or any reaction on the inside of your mouth or nose? Unknown Did you need to seek medical attention at a hospital or doctor's office? Unknown When did it last happen?childhood allergy If all above answers are "NO", may proceed with cephalosporin use.   Marland Kitchen  Prochlorperazine Edisylate Anaphylaxis  . Sulfa Antibiotics Anaphylaxis    Unable to Recall  . Tramadol Anxiety    Didn't like the way it made her feel   . Topiramate Nausea And Vomiting   Past Medical History:  Diagnosis Date  . Allergy   . Anemia    history of  . Anxiety   . Arthritis   . Asthma   . Atherosclerosis   . Depression   . Family history of adverse reaction to anesthesia    pt's mother felt everything and couldn't speak during a gallbladder surgery  . GERD (gastroesophageal reflux disease)   . History of hiatal hernia   . History of kidney stones   . Hypertension   . Pneumonia    a few times  . TIA (transient ischemic attack) 2012    Current Outpatient Medications:  .  acetaminophen (TYLENOL) 500 MG tablet, Take 1,000 mg by mouth every 8 (eight) hours as needed for moderate pain., Disp: , Rfl:  .  albuterol (VENTOLIN HFA) 108 (90 Base) MCG/ACT inhaler, Inhale 2 puffs into the lungs every 6 (six) hours as needed for wheezing or shortness of breath., Disp: , Rfl:  .  amLODipine (NORVASC) 5 MG tablet, TAKE 1 TABLET DAILY (Patient taking differently: Take 5 mg by mouth daily.), Disp: 90 tablet, Rfl: 2 .  HYDROcodone-acetaminophen (NORCO/VICODIN) 5-325 MG tablet, Take 1 tablet by mouth every 4 (four) hours as needed  for severe pain., Disp: 20 tablet, Rfl: 0 .  lidocaine (LIDODERM) 5 %, Place 1 patch onto the skin daily. Remove & Discard patch within 12 hours or as directed by MD, Disp: 30 patch, Rfl: prn .  metoprolol succinate (TOPROL-XL) 25 MG 24 hr tablet, TAKE 1 TABLET DAILY (Patient taking differently: Take 25 mg by mouth daily.), Disp: 90 tablet, Rfl: 2 .  ondansetron (ZOFRAN) 4 MG tablet, Take 1 tablet (4 mg total) by mouth daily as needed for nausea or vomiting., Disp: 60 tablet, Rfl: 1 .  sertraline (ZOLOFT) 100 MG tablet, Take 1 tablet (100 mg total) by mouth daily., Disp: 90 tablet, Rfl: 3 .  traZODone (DESYREL) 100 MG tablet, Take 200 mg by mouth at bedtime.  , Disp: , Rfl:  .  triamcinolone ointment (KENALOG) 0.5 %, Apply 1 application topically 2 (two) times daily., Disp: 60 g, Rfl: 0 Social History   Socioeconomic History  . Marital status: Married    Spouse name: Not on file  . Number of children: 3  . Years of education: Not on file  . Highest education level: Not on file  Occupational History  . Not on file  Tobacco Use  . Smoking status: Current Every Day Smoker    Packs/day: 0.50    Years: 23.00    Pack years: 11.50    Types: Cigarettes  . Smokeless tobacco: Never Used  Vaping Use  . Vaping Use: Never used  Substance and Sexual Activity  . Alcohol use: Yes    Comment: occ  . Drug use: Never  . Sexual activity: Not Currently  Other Topics Concern  . Not on file  Social History Narrative   Recently relocated to [place] from [place].  She resides at home with her husband.   She has 3 children but 1 passed away from cancer.   Social Determinants of Health   Financial Resource Strain: Not on file  Food Insecurity: Not on file  Transportation Needs: Not on file  Physical Activity: Not on file  Stress: Not on file  Social Connections: Not on file  Intimate Partner Violence: Not on file   Family History  Problem Relation Age of Onset  . Anxiety disorder Mother   . Depression Mother   . Heart disease Mother   . Hypertension Mother   . Arrhythmia Mother   . Cancer Father        colon, age greater than 50  . Lung cancer Father   . Migraines Sister   . Alcohol abuse Brother   . Cancer Daughter        nerve sheath sarcoma    Objective: Office vital signs reviewed. BP 128/80   Pulse 65   Temp (!) 97.3 F (36.3 C)   Ht _0  (1.651 m)   Wt 232 lb 9.6 oz (105.5 kg)   SpO2 99%   BMI 38.71 kg/m   Physical Examination:  General: Awake, alert, well nourished, No acute distress Cardio: regular rate and rhythm, S1S2 heard, no murmurs appreciated Pulm: clear to auscultation bilaterally, no wheezes, rhonchi or  rales; normal work of breathing on room air Extremities: warm, well perfused, No edema, cyanosis or clubbing; +2 pulses bilaterally MSK: Antalgic gait.  Utilizes cane for ambulation Psych: Mood stable, speech normal, affect appropriate  Assessment/ Plan: 60 y.o. female   Pure hypercholesterolemia - Plan: Lipid Panel, CMP14+EGFR  DDD (degenerative disc disease), lumbosacral - Plan: HYDROcodone-acetaminophen (NORCO/VICODIN) 5-325 MG tablet  Chronic left-sided low back  pain with left-sided sciatica - Plan: HYDROcodone-acetaminophen (NORCO/VICODIN) 5-325 MG tablet  Psychophysiological insomnia - Plan: traZODone (DESYREL) 100 MG tablet  Check lipid panel, CMP  DDD somewhat exacerbated given recent increase in physical activity.  Norco sent.  Have given her an extra few tablets to have on hand if needed.  She seems to be using sparingly and appropriately.  No red flag signs or symptoms.  National narcotic database was reviewed and there were no red flags.  Follow-up in 3 months, sooner if needed  Trazodone renewed.  Caution serotonin syndrome with Zoloft.  She is not having any concerning symptoms or signs which should be monitored closely.  We discussed the signs and symptoms of serotonin syndrome today.  No orders of the defined types were placed in this encounter.  No orders of the defined types were placed in this encounter.    Sydney Norlander, DO [place] [phone]

## 2020-12-12 LAB — CMP14+EGFR
ALT: 10 IU/L (ref 0–32)
AST: 11 IU/L (ref 0–40)
Albumin/Globulin Ratio: 1.6 (ref 1.2–2.2)
Albumin: 3.9 g/dL (ref 3.8–4.8)
Alkaline Phosphatase: 103 IU/L (ref 44–121)
BUN/Creatinine Ratio: 21 (ref 12–28)
BUN: 13 mg/dL (ref 8–27)
Bilirubin Total: 0.6 mg/dL (ref 0.0–1.2)
CO2: 23 mmol/L (ref 20–29)
Calcium: 9 mg/dL (ref 8.7–10.3)
Chloride: 104 mmol/L (ref 96–106)
Creatinine, Ser: 0.62 mg/dL (ref 0.57–1.00)
Globulin, Total: 2.4 g/dL (ref 1.5–4.5)
Glucose: 91 mg/dL (ref 65–99)
Potassium: 4.5 mmol/L (ref 3.5–5.2)
Sodium: 140 mmol/L (ref 134–144)
Total Protein: 6.3 g/dL (ref 6.0–8.5)
eGFR: 101 mL/min/{1.73_m2} (ref >59)

## 2020-12-12 LAB — LIPID PANEL
Chol/HDL Ratio: 4 ratio (ref 0.0–4.4)
Cholesterol, Total: 187 mg/dL (ref 100–199)
HDL: 47 mg/dL (ref >39)
LDL Chol Calc (NIH): 127 mg/dL — ABNORMAL HIGH (ref 0–99)
Triglycerides: 71 mg/dL (ref 0–149)
VLDL Cholesterol Cal: 13 mg/dL (ref 5–40)

## 2020-12-14 ENCOUNTER — Telehealth: Payer: Self-pay | Admitting: Family Medicine

## 2020-12-14 MED ORDER — ROSUVASTATIN CALCIUM 10 MG PO TABS
10.0000 mg | ORAL_TABLET | Freq: Every day | ORAL | 1 refills | Status: DC
Start: 2020-12-14 — End: 2021-01-23

## 2020-12-14 NOTE — Telephone Encounter (Signed)
Rx sent 

## 2020-12-14 NOTE — Telephone Encounter (Signed)
Reviewed lab results with pt per Christys notes and pt voiced understanding. Pt does want Christy to send in Crestor 10mg  Rx to [facility] in [place].

## 2020-12-28 ENCOUNTER — Ambulatory Visit: Payer: No Typology Code available for payment source | Admitting: Nurse Practitioner

## 2020-12-28 DIAGNOSIS — H6501 Acute serous otitis media, right ear: Secondary | ICD-10-CM

## 2020-12-28 DIAGNOSIS — J069 Acute upper respiratory infection, unspecified: Secondary | ICD-10-CM | POA: Diagnosis not present

## 2020-12-28 MED ORDER — AZITHROMYCIN 250 MG PO TABS
ORAL_TABLET | ORAL | 0 refills | Status: DC
Start: 2020-12-28 — End: 2021-01-23

## 2020-12-28 MED ORDER — BENZONATATE 100 MG PO CAPS
100.0000 mg | ORAL_CAPSULE | Freq: Three times a day (TID) | ORAL | 0 refills | Status: DC | PRN
Start: 2020-12-28 — End: 2021-01-23

## 2020-12-28 MED ORDER — PREDNISONE 10 MG (21) PO TBPK
ORAL_TABLET | ORAL | 0 refills | Status: DC
Start: 2020-12-28 — End: 2021-01-23

## 2020-12-28 NOTE — Progress Notes (Signed)
   Virtual Visit  Note Due to COVID-19 pandemic this visit was conducted virtually. This visit type was conducted due to national recommendations for restrictions regarding the COVID-19 Pandemic (e.g. social distancing, sheltering in place) in an effort to limit this patient's exposure and mitigate transmission in our community. All issues noted in this document were discussed and addressed.  A physical exam was not performed with this format.  I connected with Sydney [name] on 12/30/20 at 4 PM by telephone and verified that I am speaking with the correct person using two identifiers. Sydney [name] is currently located at home during visit. The provider, Daryll Drown, NP is located in their office at time of visit.  I discussed the limitations, risks, security and privacy concerns of performing an evaluation and management service by telephone and the availability of in person appointments. I also discussed with the patient that there may be a patient responsible charge related to this service. The patient expressed understanding and agreed to proceed.   History and Present Illness:  URI  This is a recurrent problem. Episode onset: In the past 48 to 72 hours. The problem has been gradually worsening. There has been no fever. Associated symptoms include congestion, coughing, ear pain and a sore throat. Pertinent negatives include no abdominal pain, chest pain, headaches, joint pain or nausea. She has tried nothing for the symptoms.  Otalgia  There is pain in the right ear. This is a recurrent problem. Episode onset: In the past 3 days. The problem has been gradually worsening. There has been no fever. The pain is moderate. Associated symptoms include coughing and a sore throat. Pertinent negatives include no abdominal pain or headaches. She has tried acetaminophen for the symptoms. The treatment provided mild relief.     Review of Systems  HENT:  Positive for congestion, ear pain and sore  throat.   Respiratory:  Positive for cough.   Cardiovascular:  Negative for chest pain.  Gastrointestinal:  Negative for abdominal pain and nausea.  Musculoskeletal:  Negative for joint pain.  Neurological:  Negative for headaches.    Observations/Objective: Televisit patient did not sound to be in distress.  Assessment and Plan:  Encouraged patient to take medication as prescribed, increase hydration, Tylenol for pain/ibuprofen for pain, Z-Pak, prednisone taper, benzonatate for cough.  Follow Up Instructions:   Patient knows to follow-up with worsening unresolved symptoms.   I discussed the assessment and treatment plan with the patient. The patient was provided an opportunity to ask questions and all were answered. The patient agreed with the plan and demonstrated an understanding of the instructions.   The patient was advised to call back or seek an in-person evaluation if the symptoms worsen or if the condition fails to improve as anticipated.  The above assessment and management plan was discussed with the patient. The patient verbalized understanding of and has agreed to the management plan. Patient is aware to call the clinic if symptoms persist or worsen. Patient is aware when to return to the clinic for a follow-up visit. Patient educated on when it is appropriate to go to the emergency department.   Time call ended: 4:12 PM  I provided 12 minutes of  non face-to-face time during this encounter.    Daryll Drown, NP

## 2020-12-30 ENCOUNTER — Encounter: Payer: Self-pay | Admitting: Nurse Practitioner

## 2020-12-30 DIAGNOSIS — J069 Acute upper respiratory infection, unspecified: Secondary | ICD-10-CM | POA: Insufficient documentation

## 2020-12-30 DIAGNOSIS — H6501 Acute serous otitis media, right ear: Secondary | ICD-10-CM | POA: Insufficient documentation

## 2020-12-30 NOTE — Assessment & Plan Note (Signed)
Encouraged patient to take medication as prescribed, increase hydration, Tylenol for pain/ibuprofen for pain, Z-Pak, prednisone taper, benzonatate for cough.

## 2020-12-30 NOTE — Assessment & Plan Note (Signed)
Encouraged patient to take medication as prescribed, increase hydration, Tylenol for pain/ibuprofen for pain, Z-Pak, prednisone taper, benzonatate for cough. 

## 2021-01-03 ENCOUNTER — Ambulatory Visit (INDEPENDENT_AMBULATORY_CARE_PROVIDER_SITE_OTHER): Payer: No Typology Code available for payment source | Admitting: Orthopaedic Surgery

## 2021-01-03 ENCOUNTER — Other Ambulatory Visit: Payer: Self-pay

## 2021-01-03 ENCOUNTER — Encounter: Payer: Self-pay | Admitting: Orthopaedic Surgery

## 2021-01-03 VITALS — Ht 65.0 in | Wt 232.0 lb

## 2021-01-03 DIAGNOSIS — Z96643 Presence of artificial hip joint, bilateral: Secondary | ICD-10-CM | POA: Insufficient documentation

## 2021-01-03 DIAGNOSIS — M7062 Trochanteric bursitis, left hip: Secondary | ICD-10-CM | POA: Diagnosis not present

## 2021-01-03 NOTE — Progress Notes (Signed)
Office Visit Note   Patient: Sydney [name]           Date of Birth: 1960-11-23           MRN: [id number] Visit Date: 01/03/2021              Requested by: Raliegh Ip, DO [address] [place],  [place] [address] PCP: Raliegh Ip, DO   Assessment & Plan: Visit Diagnoses:  1. Trochanteric bursitis, left hip   2. History of bilateral hip arthroplasty     Plan: Patient is done well with therapy and got good relief with her trochanteric injection.  We discussed this is likely related to the foraminal stenosis in the lumbar spine.  She has a friend done next over the pool and will try to get some exercise into the pool which will help with her back and not bother her knees or feet.  We discussed using shoes with good arch support since she has flexible flatfeet and has more planovalgus in the left than right foot.  She can follow-up with me as needed.  Follow-Up Instructions: No follow-ups on file.   Orders:  No orders of the defined types were placed in this encounter.  No orders of the defined types were placed in this encounter.     Procedures: No procedures performed   Clinical Data: No additional findings.   Subjective: Chief Complaint  Patient presents with   Lower Back - Pain    HPI patient turns post bilateral total of arthroplasties.  She has some scoliosis with some foraminal stenosis on the left at L2-3 and L3-4 and got family with trochanteric injection and now with continued therapy.  She had previous cervical fusion done in [place].  Review of Systems bilateral hip arthroplasties done February and May 2021.  Denies hip pain.  All other systems updated unchanged.   Objective: Vital Signs: Ht 5\' 5"  (1.651 m)   Wt 232 lb (105.2 kg)   BMI 38.61 kg/m   Physical Exam Constitutional:      Appearance: She is well-developed.  HENT:     Head: Normocephalic.     Right Ear: External ear normal.     Left Ear: External ear normal. There is no  impacted cerumen.  Eyes:     Pupils: Pupils are equal, round, and reactive to light.  Neck:     Thyroid: No thyromegaly.     Trachea: No tracheal deviation.  Cardiovascular:     Rate and Rhythm: Normal rate.  Pulmonary:     Effort: Pulmonary effort is normal.  Abdominal:     Palpations: Abdomen is soft.  Musculoskeletal:     Cervical back: No rigidity.  Skin:    General: Skin is warm and dry.  Neurological:     Mental Status: She is alert and oriented to person, place, and time.  Psychiatric:        Behavior: Behavior normal.    Ortho Exam good range of motion of her hips.  Amatory without limp.  Minimal trochanteric tenderness on the left.  Lumbar scoliosis again noted.  Anterior tib gastrocsoleus is active.  Specialty Comments:  No specialty comments available.  Imaging: No results found.   PMFS History: Patient Active Problem List   Diagnosis Date Noted   History of bilateral hip arthroplasty 01/03/2021   Upper respiratory infection with cough and congestion 12/30/2020   Non-recurrent acute serous otitis media of right ear 12/30/2020   Sore throat 10/29/2020  Trochanteric bursitis, left hip 09/13/2020   Closed fracture of multiple pubic rami, left, sequela 08/16/2020   H/O adenomatous polyp of colon 05/23/2020   FH: colon cancer in relative diagnosed at >70 years old 05/23/2020   Spinal stenosis of lumbar region 05/03/2020   Impingement syndrome of right shoulder 05/03/2020   Other secondary scoliosis, lumbar region 03/01/2020   H/O total hip arthroplasty 12/06/2019   Arthritis of right hip 12/05/2019   Status post total replacement of left hip 09/15/2019   Back pain 07/01/2018   DDD (degenerative disc disease), lumbosacral 07/01/2018   Venous insufficiency of both lower extremities 11/17/2016   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 level with radiculopathy 08/18/2016   Morbid obesity with BMI of 40.0-44.9, adult (HCC) 07/28/2016   Chronic pain syndrome 10/24/2015    Dysfunction of both eustachian tubes 07/26/2014   Calculus of left kidney 04/25/2014   Impaired fasting glucose 04/05/2014   History of bladder surgery 04/03/2014   History of hysterectomy 04/03/2014   Urge incontinence of urine 04/03/2014   Asthma 07/28/2013   Nontoxic uninodular goiter 07/28/2013   Solitary pulmonary nodule 07/28/2013   Lumbar radiculopathy 04/20/2013   Insomnia 03/22/2013   Essential hypertension 08/18/2011   Tobacco use disorder 08/18/2011   Moderate episode of recurrent major depressive disorder (HCC) 04/03/2011   Other B-complex deficiencies 08/16/2010   Family history of cerebrovascular accident (CVA) 04/17/2010   Family history of malignant neoplasm of gastrointestinal tract 04/17/2010   Family history of other cardiovascular diseases(V17.49) 04/17/2010   Past Medical History:  Diagnosis Date   Allergy    Anemia    history of   Anxiety    Arthritis    Asthma    Atherosclerosis    Depression    Family history of adverse reaction to anesthesia    pt's mother felt everything and couldn't speak during a gallbladder surgery   GERD (gastroesophageal reflux disease)    History of hiatal hernia    History of kidney stones    Hypertension    Pneumonia    a few times   TIA (transient ischemic attack) 2012    Family History  Problem Relation Age of Onset   Anxiety disorder Mother    Depression Mother    Heart disease Mother    Hypertension Mother    Arrhythmia Mother    Cancer Father        colon, age greater than 68   Lung cancer Father    Migraines Sister    Alcohol abuse Brother    Cancer Daughter        nerve sheath sarcoma    Past Surgical History:  Procedure Laterality Date   ABDOMINAL HYSTERECTOMY     abdominal   APPENDECTOMY     bladder tack     CARPAL TUNNEL RELEASE Bilateral    CERVICAL SPINE SURGERY     5-6, 6-7   CHOLECYSTECTOMY     COLONOSCOPY WITH PROPOFOL N/A 07/10/2020   Procedure: COLONOSCOPY WITH PROPOFOL;  Surgeon:  Lanelle Bal, DO;  [facility];  Service: Endoscopy;  Laterality: N/A;  12:15pm   HAMMER TOE SURGERY Right    JOINT REPLACEMENT     TOTAL HIP 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   Procedure: LEFT TOTAL HIP ARTHROPLASTY -DIRECT ANTERIOR;  Surgeon: Eldred Manges, MD;  Location: [facility];  Service: Orthopedics;  Laterality: Left;   TOTAL HIP ARTHROPLASTY Right 12/05/2019   Procedure: RIGHT TOTAL HIP ARTHROPLASTY ANTERIOR APPROACH  DIRECT ANTERIOR;  Surgeon:  Eldred Manges, MD;  Location: [facility];  Service: Orthopedics;  Laterality: Right;   Social History   Occupational History   Not on file  Tobacco Use   Smoking status: Every Day    Packs/day: 0.50    Years: 23.00    Pack years: 11.50    Types: Cigarettes   Smokeless tobacco: Never  Vaping Use   Vaping Use: Never used  Substance and Sexual Activity   Alcohol use: Yes    Comment: occ   Drug use: Never   Sexual activity: Not Currently

## 2021-01-18 ENCOUNTER — Encounter: Payer: Self-pay | Admitting: Nurse Practitioner

## 2021-01-18 ENCOUNTER — Ambulatory Visit: Payer: No Typology Code available for payment source | Admitting: Nurse Practitioner

## 2021-01-18 ENCOUNTER — Other Ambulatory Visit: Payer: Self-pay

## 2021-01-18 VITALS — BP 120/76 | HR 78 | Temp 97.6°F | Ht 65.0 in | Wt 233.0 lb

## 2021-01-18 DIAGNOSIS — L03116 Cellulitis of left lower limb: Secondary | ICD-10-CM | POA: Diagnosis not present

## 2021-01-18 DIAGNOSIS — R21 Rash and other nonspecific skin eruption: Secondary | ICD-10-CM | POA: Diagnosis not present

## 2021-01-18 MED ORDER — DOXYCYCLINE HYCLATE 100 MG PO TABS: 100.0000 mg | ORAL_TABLET | Freq: Two times a day (BID) | ORAL | 0 refills | Status: DC

## 2021-01-18 NOTE — Assessment & Plan Note (Signed)
On assessment.  Around skin rash on left lower leg looks like cellulitis.  Completed marking around skin started patient on doxycycline, follow-up in 3 to 4 days.  Completed referral to dermatology.  Rx sent to pharmacy.

## 2021-01-18 NOTE — Assessment & Plan Note (Signed)
Rash left lower feet is worsening.  Increase pruritis, slight pain and discomfort.  Ice compress, avoid scratching, Tylenol.  Continue triamcinolone.  Completed dermatology referral, follow-up with worsening unresolved symptoms.

## 2021-01-18 NOTE — Progress Notes (Signed)
Acute Office Visit  Subjective:    Patient ID: Sydney [name], female    DOB: 08/12/1960, 60 y.o.   MRN: [id number]  Chief Complaint  Patient presents with   Rash   Cellulitis     Rash This is a chronic problem. The current episode started more than 1 month ago. The problem has been gradually worsening since onset. The affected locations include the left foot. The rash is characterized by dryness, redness and itchiness. She was exposed to nothing. Pertinent negatives include no congestion, cough, fatigue or fever. Past treatments include oral steroids. The treatment provided no relief. There is no history of allergies or asthma.       Cellulitis: Area around rash on left leg is read and warm to touch, patient with a history of cellulites and has dealt with COVID-rash for the past 6 months. No fever, nausea or chills associated with current symptoms   Allergies: Bee pollen, Bee venom, Butorphanol, Meloxicam, Penicillins, Prochlorperazine edisylate, Sulfa antibiotics, Tramadol, and Topiramate Patient Active Problem List   Diagnosis Date Noted   Cellulitis of left leg 01/18/2021   Rash 01/18/2021   History of bilateral hip arthroplasty 01/03/2021   Upper respiratory infection with cough and congestion 12/30/2020   Non-recurrent acute serous otitis media of right ear 12/30/2020   Sore throat 10/29/2020   Trochanteric bursitis, left hip 09/13/2020   Closed fracture of multiple pubic rami, left, sequela 08/16/2020   H/O adenomatous polyp of colon 05/23/2020   FH: colon cancer in relative diagnosed at >64 years old 05/23/2020   Spinal stenosis of lumbar region 05/03/2020   Impingement syndrome of right shoulder 05/03/2020   Other secondary scoliosis, lumbar region 03/01/2020   H/O total hip arthroplasty 12/06/2019   Arthritis of right hip 12/05/2019   Status post total replacement of left hip 09/15/2019   Back pain 07/01/2018   DDD (degenerative disc disease), lumbosacral  07/01/2018   Venous insufficiency of both lower extremities 11/17/2016   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 level with radiculopathy 08/18/2016   Morbid obesity with BMI of 40.0-44.9, adult ([place]) 07/28/2016   Chronic pain syndrome 10/24/2015   Dysfunction of both eustachian tubes 07/26/2014   Calculus of left kidney 04/25/2014   Impaired fasting glucose 04/05/2014   History of bladder surgery 04/03/2014   History of hysterectomy 04/03/2014   Urge incontinence of urine 04/03/2014   Asthma 07/28/2013   Nontoxic uninodular goiter 07/28/2013   Solitary pulmonary nodule 07/28/2013   Lumbar radiculopathy 04/20/2013   Insomnia 03/22/2013   Essential hypertension 08/18/2011   Tobacco use disorder 08/18/2011   Moderate episode of recurrent major depressive disorder ([place]) 04/03/2011   Other B-complex deficiencies 08/16/2010   Family history of cerebrovascular accident (CVA) 04/17/2010   Family history of malignant neoplasm of gastrointestinal tract 04/17/2010   Family history of other cardiovascular diseases(V17.49) 04/17/2010     Past Medical History:  Diagnosis Date   Allergy    Anemia    history of   Anxiety    Arthritis    Asthma    Atherosclerosis    Depression    Family history of adverse reaction to anesthesia    pt's mother felt everything and couldn't speak during a gallbladder surgery   GERD (gastroesophageal reflux disease)    History of hiatal hernia    History of kidney stones    Hypertension    Pneumonia    a few times   TIA (transient ischemic attack) 2012    Past Surgical  History:  Procedure Laterality Date   ABDOMINAL HYSTERECTOMY     abdominal   APPENDECTOMY     bladder tack     CARPAL TUNNEL RELEASE Bilateral    CERVICAL SPINE SURGERY     5-6, 6-7   CHOLECYSTECTOMY     COLONOSCOPY WITH PROPOFOL N/A 07/10/2020   Procedure: COLONOSCOPY WITH PROPOFOL;  Surgeon: Eloise Harman, DO;  [facility];  Service: Endoscopy;  Laterality: N/A;  12:15pm    HAMMER TOE SURGERY Right    JOINT REPLACEMENT     TOTAL HIP 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   Procedure: LEFT TOTAL HIP ARTHROPLASTY -DIRECT ANTERIOR;  Surgeon: Marybelle Killings, MD;  Location: [place];  Service: Orthopedics;  Laterality: Left;   TOTAL HIP ARTHROPLASTY Right 12/05/2019   Procedure: RIGHT TOTAL HIP ARTHROPLASTY ANTERIOR APPROACH  DIRECT ANTERIOR;  Surgeon: Marybelle Killings, MD;  Location: [place];  Service: Orthopedics;  Laterality: Right;    Family History  Problem Relation Age of Onset   Anxiety disorder Mother    Depression Mother    Heart disease Mother    Hypertension Mother    Arrhythmia Mother    Cancer Father        colon, age greater than 95   Lung cancer Father    Migraines Sister    Alcohol abuse Brother    Cancer Daughter        nerve sheath sarcoma    Social History   Socioeconomic History   Marital status: Married    Spouse name: Not on file   Number of children: 3   Years of education: Not on file   Highest education level: Not on file  Occupational History   Not on file  Tobacco Use   Smoking status: Every Day    Packs/day: 0.50    Years: 23.00    Pack years: 11.50    Types: Cigarettes   Smokeless tobacco: Never  Vaping Use   Vaping Use: Never used  Substance and Sexual Activity   Alcohol use: Yes    Comment: occ   Drug use: Never   Sexual activity: Not Currently  Other Topics Concern   Not on file  Social History Narrative   Recently relocated to [place] from [place].  She resides at home with her husband.   She has 3 children but 1 passed away from cancer.   Social Determinants of Health   Financial Resource Strain: Not on file  Food Insecurity: Not on file  Transportation Needs: Not on file  Physical Activity: Not on file  Stress: Not on file  Social Connections: Not on file  Intimate Partner Violence: Not on file    Outpatient Medications Prior to Visit  Medication Sig Dispense Refill   acetaminophen (TYLENOL) 500 MG  tablet Take 1,000 mg by mouth every 8 (eight) hours as needed for moderate pain.     albuterol (VENTOLIN HFA) 108 (90 Base) MCG/ACT inhaler Inhale 2 puffs into the lungs every 6 (six) hours as needed for wheezing or shortness of breath.     amLODipine (NORVASC) 5 MG tablet TAKE 1 TABLET DAILY (Patient taking differently: Take 5 mg by mouth daily.) 90 tablet 2   azithromycin (ZITHROMAX) 250 MG tablet 500 mg tablet by mouth daily, 250 mg tablet by mouth day 2-5 1 each 0   benzonatate (TESSALON PERLES) 100 MG capsule Take 1 capsule (100 mg total) by mouth 3 (three) times daily as needed for cough. 20 capsule 0  HYDROcodone-acetaminophen (NORCO/VICODIN) 5-325 MG tablet Take 1 tablet by mouth every 4 (four) hours as needed for severe pain. 30 tablet 0   lidocaine (LIDODERM) 5 % Place 1 patch onto the skin daily. Remove & Discard patch within 12 hours or as directed by MD 30 patch prn   metoprolol succinate (TOPROL-XL) 25 MG 24 hr tablet TAKE 1 TABLET DAILY (Patient taking differently: Take 25 mg by mouth daily.) 90 tablet 2   ondansetron (ZOFRAN) 4 MG tablet Take 1 tablet (4 mg total) by mouth daily as needed for nausea or vomiting. 60 tablet 1   predniSONE (STERAPRED UNI-PAK 21 TAB) 10 MG (21) TBPK tablet 6 tablet  day 1, 5 tablet day 2, 4 tablet day 3, 3 tablet daily for, 2 tablet day 5, 1 tablet day 6. 1 each 0   rosuvastatin (CRESTOR) 10 MG tablet Take 1 tablet (10 mg total) by mouth daily. 90 tablet 1   sertraline (ZOLOFT) 100 MG tablet Take 1 tablet (100 mg total) by mouth daily. 90 tablet 3   traZODone (DESYREL) 100 MG tablet Take 1-2 tablets (100-200 mg total) by mouth at bedtime as needed for sleep. Place on file 180 tablet 1   triamcinolone ointment (KENALOG) 0.5 % Apply 1 application topically 2 (two) times daily. 60 g 0   No facility-administered medications prior to visit.    Allergies  Allergen Reactions   Bee Pollen Anaphylaxis and Swelling   Bee Venom Anaphylaxis and Swelling    Butorphanol Other (See Comments)    Not sure told by md she was allergic after surgery.    Meloxicam Anxiety    Mood disorder Altered her personality    Penicillins Anaphylaxis and Hives    Unable to Recall As child mom was told she is highly allergic Did it involve swelling of the face/tongue/throat, SOB, or low BP? Unknown Did it involve sudden or severe rash/hives, skin peeling, or any reaction on the inside of your mouth or nose? Unknown Did you need to seek medical attention at a hospital or doctor's office? Unknown When did it last happen?      childhood allergy If all above answers are "NO", may proceed with cephalosporin use.    Prochlorperazine Edisylate Anaphylaxis   Sulfa Antibiotics Anaphylaxis    Unable to Recall   Tramadol Anxiety    Didn't like the way it made her feel    Topiramate Nausea And Vomiting    Review of Systems  Constitutional:  Negative for fatigue and fever.  HENT:  Negative for congestion.   Respiratory:  Negative for cough.   Gastrointestinal: Negative.   Genitourinary: Negative.   Skin:  Positive for color change and rash.  Psychiatric/Behavioral: Negative.    All other systems reviewed and are negative.     Objective:    Physical Exam Vitals and nursing note reviewed.  Constitutional:      Appearance: Normal appearance.  HENT:     Head: Normocephalic.     Nose: Nose normal.     Mouth/Throat:     Mouth: Mucous membranes are moist.     Pharynx: Oropharynx is clear.  Eyes:     Conjunctiva/sclera: Conjunctivae normal.  Cardiovascular:     Rate and Rhythm: Normal rate and regular rhythm.     Pulses: Normal pulses.     Heart sounds: Normal heart sounds.  Pulmonary:     Effort: Pulmonary effort is normal.     Breath sounds: Normal breath sounds.  Abdominal:  General: Bowel sounds are normal.  Skin:    Findings: Erythema and rash present.  Neurological:     Mental Status: She is alert and oriented to person, place, and time.   Psychiatric:        Behavior: Behavior normal.    BP 120/76   Pulse 78   Temp 97.6 F (36.4 C) (Temporal)   Ht _0  (1.651 m)   Wt 233 lb (105.7 kg)   SpO2 97%   BMI 38.77 kg/m  Wt Readings from Last 3 Encounters:  01/18/21 233 lb (105.7 kg)  01/03/21 232 lb (105.2 kg)  12/11/20 232 lb 9.6 oz (105.5 kg)    Health Maintenance Due  Topic Date Due   Pneumococcal Vaccine 25-47 Years old (1 - PCV) Never done   Zoster Vaccines- Shingrix (1 of 2) Never done   MAMMOGRAM  Never done   COVID-19 Vaccine (3 - Pfizer risk series) 12/03/2019    There are no preventive care reminders to display for this patient.   Lab Results  Component Value Date   TSH 1.110 05/29/2020   Lab Results  Component Value Date   WBC 5.2 07/06/2020   HGB 14.2 07/06/2020   HCT 45.0 07/06/2020   MCV 95.3 07/06/2020   PLT 254 07/06/2020   Lab Results  Component Value Date   NA 140 12/11/2020   K 4.5 12/11/2020   CO2 23 12/11/2020   GLUCOSE 91 12/11/2020   BUN 13 12/11/2020   CREATININE 0.62 12/11/2020   BILITOT 0.6 12/11/2020   ALKPHOS 103 12/11/2020   AST 11 12/11/2020   ALT 10 12/11/2020   PROT 6.3 12/11/2020   ALBUMIN 3.9 12/11/2020   CALCIUM 9.0 12/11/2020   ANIONGAP 6 07/06/2020   EGFR 101 12/11/2020   Lab Results  Component Value Date   CHOL 187 12/11/2020   Lab Results  Component Value Date   HDL 47 12/11/2020   Lab Results  Component Value Date   LDLCALC 127 (H) 12/11/2020   Lab Results  Component Value Date   TRIG 71 12/11/2020   Lab Results  Component Value Date   CHOLHDL 4.0 12/11/2020   Lab Results  Component Value Date   HGBA1C 5.5 10/01/2018       Assessment & Plan:   Problem List Items Addressed This Visit       Musculoskeletal and Integument   Rash    Rash left lower feet is worsening.  Increase pruritis, slight pain and discomfort.  Ice compress, avoid scratching, Tylenol.  Continue triamcinolone.  Completed dermatology referral, follow-up with  worsening unresolved symptoms.         Relevant Orders   Ambulatory referral to Dermatology     Other   Cellulitis of left leg - Primary    On assessment.  Around skin rash on left lower leg looks like cellulitis.  Completed marking around skin started patient on doxycycline, follow-up in 3 to 4 days.  Completed referral to dermatology.  Rx sent to pharmacy.       Relevant Medications   doxycycline (VIBRA-TABS) 100 MG tablet     Meds ordered this encounter  Medications   doxycycline (VIBRA-TABS) 100 MG tablet    Sig: Take 1 tablet (100 mg total) by mouth 2 (two) times daily.    Dispense:  14 tablet    Refill:  0    Order Specific Question:   Supervising Provider    Answer:   Janora Norlander [[id number]]  Ivy Lynn, NP

## 2021-01-18 NOTE — Patient Instructions (Signed)
Rash, Adult  A rash is a change in the color of your skin. A rash can also change the way your skin feels. There are many different conditions and factors that can causea rash. Follow these instructions at home: The goal of treatment is to stop the itching and keep the rash from spreading. Watch for any changes in your symptoms. Let your doctor know about them. Followthese instructions to help with your condition: Medicine Take or apply over-the-counter and prescription medicines only as told by your doctor. These may include medicines: To treat red or swollen skin (corticosteroid creams). To treat itching. To treat an allergy (oral antihistamines). To treat very bad symptoms (oral corticosteroids).  Skin care Put cool cloths (compresses) on the affected areas. Do not scratch or rub your skin. Avoid covering the rash. Make sure that the rash is exposed to air as much as possible. Managing itching and discomfort Avoid hot showers or baths. These can make itching worse. A cold shower may help. Try taking a bath with: Epsom salts. You can get these at your local pharmacy or grocery store. Follow the instructions on the package. Baking soda. Pour a small amount into the bath as told by your doctor. Colloidal oatmeal. You can get this at your local pharmacy or grocery store. Follow the instructions on the package. Try putting baking soda paste onto your skin. Stir water into baking soda until it gets like a paste. Try putting on a lotion that relieves itchiness (calamine lotion). Keep cool and out of the sun. Sweating and being hot can make itching worse. General instructions  Rest as needed. Drink enough fluid to keep your pee (urine) pale yellow. Wear loose-fitting clothing. Avoid scented soaps, detergents, and perfumes. Use gentle soaps, detergents, perfumes, and other cosmetic products. Avoid anything that causes your rash. Keep a journal to help track what causes your rash. Write  down: What you eat. What cosmetic products you use. What you drink. What you wear. This includes jewelry. Keep all follow-up visits as told by your doctor. This is important.  Contact a doctor if: You sweat at night. You lose weight. You pee (urinate) more than normal. You pee less than normal, or you notice that your pee is a darker color than normal. You feel weak. You throw up (vomit). Your skin or the whites of your eyes look yellow (jaundice). Your skin: Tingles. Is numb. Your rash: Does not go away after a few days. Gets worse. You are: More thirsty than normal. More tired than normal. You have: New symptoms. Pain in your belly (abdomen). A fever. Watery poop (diarrhea). Get help right away if: You have a fever and your symptoms suddenly get worse. You start to feel mixed up (confused). You have a very bad headache or a stiff neck. You have very bad joint pains or stiffness. You have jerky movements that you cannot control (seizure). Your rash covers all or most of your body. The rash may or may not be painful. You have blisters that: Are on top of the rash. Grow larger. Grow together. Are painful. Are inside your nose or mouth. You have a rash that: Looks like purple pinprick-sized spots all over your body. Has a "bull's eye" or looks like a target. Is red and painful, causes your skin to peel, and is not from being in the sun too long. Summary A rash is a change in the color of your skin. A rash can also change the way your skin feels.   The goal of treatment is to stop the itching and keep the rash from spreading. Take or apply over-the-counter and prescription medicines only as told by your doctor. Contact a doctor if you have new symptoms or symptoms that get worse. Keep all follow-up visits as told by your doctor. This is important. This information is not intended to replace advice given to you by your health care provider. Make sure you discuss any  questions you have with your healthcare provider. Document Revised: 10/29/2018 Document Reviewed: 02/08/2018 Elsevier Patient Education  2022 [employer] Cellulitis, Adult  Cellulitis is a skin infection. The infected area is often warm, red, swollen, and sore. It occurs most often in the arms and lower legs. It is very importantto get treated for this condition. What are the causes? This condition is caused by bacteria. The bacteria enter through a break in theskin, such as a cut, burn, insect bite, open sore, or crack. What increases the risk? This condition is more likely to occur in people who: Have a weak body defense system (immune system). Have open cuts, burns, bites, or scrapes on the skin. Are older than 61 years of age. Have a blood sugar problem (diabetes). Have a long-lasting (chronic) liver disease (cirrhosis) or kidney disease. Are very overweight (obese). Have a skin problem, such as: Itchy rash (eczema). Slow movement of blood in the veins (venous stasis). Fluid buildup below the skin (edema). Have been treated with high-energy rays (radiation). Use IV drugs. What are the signs or symptoms? Symptoms of this condition include: Skin that is: Red. Streaking. Spotting. Swollen. Sore or painful when you touch it. Warm. A fever. Chills. Blisters. How is this diagnosed? This condition is diagnosed based on: Medical history. Physical exam. Blood tests. Imaging tests. How is this treated? Treatment for this condition may include: Medicines to treat infections or allergies. Home care, such as: Rest. Placing cold or warm cloths (compresses) on the skin. Hospital care, if the condition is very bad. Follow these instructions at home: Medicines Take over-the-counter and prescription medicines only as told by your doctor. If you were prescribed an antibiotic medicine, take it as told by your doctor. Do not stop taking it even if you start to feel  better. General instructions  Drink enough fluid to keep your pee (urine) pale yellow. Do not touch or rub the infected area. Raise (elevate) the infected area above the level of your heart while you are sitting or lying down. Place cold or warm cloths on the area as told by your doctor. Keep all follow-up visits as told by your doctor. This is important.  Contact a doctor if: You have a fever. You do not start to get better after 1-2 days of treatment. Your bone or joint under the infected area starts to hurt after the skin has healed. Your infection comes back. This can happen in the same area or another area. You have a swollen bump in the area. You have new symptoms. You feel ill and have muscle aches and pains. Get help right away if: Your symptoms get worse. You feel very sleepy. You throw up (vomit) or have watery poop (diarrhea) for a long time. You see red streaks coming from the area. Your red area gets larger. Your red area turns dark in color. These symptoms may represent a serious problem that is an emergency. Do not wait to see if the symptoms will go away. Get medical help right away. Call your local emergency services (911 in  the [place] Do not drive yourself to the hospital. Summary Cellulitis is a skin infection. The area is often warm, red, swollen, and sore. This condition is treated with medicines, rest, and cold and warm cloths. Take all medicines only as told by your doctor. Tell your doctor if symptoms do not start to get better after 1-2 days of treatment. This information is not intended to replace advice given to you by your health care provider. Make sure you discuss any questions you have with your healthcare provider. Document Revised: 11/26/2017 Document Reviewed: 11/26/2017 Elsevier Patient Education  2022 [employer].

## 2021-01-23 ENCOUNTER — Encounter: Payer: Self-pay | Admitting: Nurse Practitioner

## 2021-01-23 ENCOUNTER — Other Ambulatory Visit: Payer: Self-pay

## 2021-01-23 ENCOUNTER — Ambulatory Visit: Payer: No Typology Code available for payment source | Admitting: Nurse Practitioner

## 2021-01-23 VITALS — BP 135/78 | HR 82 | Temp 98.6°F | Ht 65.0 in | Wt 233.0 lb

## 2021-01-23 DIAGNOSIS — R21 Rash and other nonspecific skin eruption: Secondary | ICD-10-CM

## 2021-01-23 MED ORDER — HYDROXYZINE HCL 10 MG PO TABS: 10.0000 mg | ORAL_TABLET | Freq: Three times a day (TID) | ORAL | 0 refills | Status: DC | PRN

## 2021-01-23 MED ORDER — HYDROCORTISONE 1 % EX LOTN
1.0000 | TOPICAL_LOTION | Freq: Two times a day (BID) | CUTANEOUS | 0 refills | Status: DC
Start: 2021-01-23 — End: 2021-03-06

## 2021-01-23 NOTE — Patient Instructions (Signed)
Rash, Adult  A rash is a change in the color of your skin. A rash can also change the way your skin feels. There are many different conditions and factors that can causea rash. Follow these instructions at home: The goal of treatment is to stop the itching and keep the rash from spreading. Watch for any changes in your symptoms. Let your doctor know about them. Followthese instructions to help with your condition: Medicine Take or apply over-the-counter and prescription medicines only as told by your doctor. These may include medicines: To treat red or swollen skin (corticosteroid creams). To treat itching. To treat an allergy (oral antihistamines). To treat very bad symptoms (oral corticosteroids).  Skin care Put cool cloths (compresses) on the affected areas. Do not scratch or rub your skin. Avoid covering the rash. Make sure that the rash is exposed to air as much as possible. Managing itching and discomfort Avoid hot showers or baths. These can make itching worse. A cold shower may help. Try taking a bath with: Epsom salts. You can get these at your local pharmacy or grocery store. Follow the instructions on the package. Baking soda. Pour a small amount into the bath as told by your doctor. Colloidal oatmeal. You can get this at your local pharmacy or grocery store. Follow the instructions on the package. Try putting baking soda paste onto your skin. Stir water into baking soda until it gets like a paste. Try putting on a lotion that relieves itchiness (calamine lotion). Keep cool and out of the sun. Sweating and being hot can make itching worse. General instructions  Rest as needed. Drink enough fluid to keep your pee (urine) pale yellow. Wear loose-fitting clothing. Avoid scented soaps, detergents, and perfumes. Use gentle soaps, detergents, perfumes, and other cosmetic products. Avoid anything that causes your rash. Keep a journal to help track what causes your rash. Write  down: What you eat. What cosmetic products you use. What you drink. What you wear. This includes jewelry. Keep all follow-up visits as told by your doctor. This is important.  Contact a doctor if: You sweat at night. You lose weight. You pee (urinate) more than normal. You pee less than normal, or you notice that your pee is a darker color than normal. You feel weak. You throw up (vomit). Your skin or the whites of your eyes look yellow (jaundice). Your skin: Tingles. Is numb. Your rash: Does not go away after a few days. Gets worse. You are: More thirsty than normal. More tired than normal. You have: New symptoms. Pain in your belly (abdomen). A fever. Watery poop (diarrhea). Get help right away if: You have a fever and your symptoms suddenly get worse. You start to feel mixed up (confused). You have a very bad headache or a stiff neck. You have very bad joint pains or stiffness. You have jerky movements that you cannot control (seizure). Your rash covers all or most of your body. The rash may or may not be painful. You have blisters that: Are on top of the rash. Grow larger. Grow together. Are painful. Are inside your nose or mouth. You have a rash that: Looks like purple pinprick-sized spots all over your body. Has a "bull's eye" or looks like a target. Is red and painful, causes your skin to peel, and is not from being in the sun too long. Summary A rash is a change in the color of your skin. A rash can also change the way your skin feels.   The goal of treatment is to stop the itching and keep the rash from spreading. Take or apply over-the-counter and prescription medicines only as told by your doctor. Contact a doctor if you have new symptoms or symptoms that get worse. Keep all follow-up visits as told by your doctor. This is important. This information is not intended to replace advice given to you by your health care provider. Make sure you discuss any  questions you have with your healthcare provider. Document Revised: 10/29/2018 Document Reviewed: 02/08/2018 Elsevier Patient Education  2022 Elsevier Inc.  

## 2021-01-23 NOTE — Progress Notes (Signed)
Acute Office Visit  Subjective:    Patient ID: Sydney [name], female    DOB: 10-29-1960, 60 y.o.   MRN: [id number]  Chief Complaint  Patient presents with   Rash    Rash This is a new problem. Episode onset: In the past 3 days. The problem has been gradually worsening since onset. The affected locations include the left hand and right hand. The rash is characterized by itchiness, dryness and redness. Pertinent negatives include no cough or sore throat. Past treatments include nothing. Her past medical history is significant for allergies.    Past Medical History:  Diagnosis Date   Allergy    Anemia    history of   Anxiety    Arthritis    Asthma    Atherosclerosis    Depression    Family history of adverse reaction to anesthesia    pt's mother felt everything and couldn't speak during a gallbladder surgery   GERD (gastroesophageal reflux disease)    History of hiatal hernia    History of kidney stones    Hypertension    Pneumonia    a few times   TIA (transient ischemic attack) 2012    Past Surgical History:  Procedure Laterality Date   ABDOMINAL HYSTERECTOMY     abdominal   APPENDECTOMY     bladder tack     CARPAL TUNNEL RELEASE Bilateral    CERVICAL SPINE SURGERY     5-6, 6-7   CHOLECYSTECTOMY     COLONOSCOPY WITH PROPOFOL N/A 07/10/2020   Procedure: COLONOSCOPY WITH PROPOFOL;  Surgeon: Eloise Harman, DO;  Location: [facility];  Service: Endoscopy;  Laterality: N/A;  12:15pm   HAMMER TOE SURGERY Right    JOINT REPLACEMENT     TOTAL HIP 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   Procedure: LEFT TOTAL HIP ARTHROPLASTY -DIRECT ANTERIOR;  Surgeon: Marybelle Killings, MD;  Location: [facility];  Service: Orthopedics;  Laterality: Left;   TOTAL HIP ARTHROPLASTY Right 12/05/2019   Procedure: RIGHT TOTAL HIP ARTHROPLASTY ANTERIOR APPROACH  DIRECT ANTERIOR;  Surgeon: Marybelle Killings, MD;  Location: [facility];  Service: Orthopedics;  Laterality: Right;    Family History  Problem Relation  Age of Onset   Anxiety disorder Mother    Depression Mother    Heart disease Mother    Hypertension Mother    Arrhythmia Mother    Cancer Father        colon, age greater than 78   Lung cancer Father    Migraines Sister    Alcohol abuse Brother    Cancer Daughter        nerve sheath sarcoma    Social History   Socioeconomic History   Marital status: Married    Spouse name: Not on file   Number of children: 3   Years of education: Not on file   Highest education level: Not on file  Occupational History   Not on file  Tobacco Use   Smoking status: Every Day    Packs/day: 0.50    Years: 23.00    Pack years: 11.50    Types: Cigarettes   Smokeless tobacco: Never  Vaping Use   Vaping Use: Never used  Substance and Sexual Activity   Alcohol use: Yes    Comment: occ   Drug use: Never   Sexual activity: Not Currently  Other Topics Concern   Not on file  Social History Narrative   Recently relocated to [place] from [place].  She  resides at home with her husband.   She has 3 children but 1 passed away from cancer.   Social Determinants of Health   Financial Resource Strain: Not on file  Food Insecurity: Not on file  Transportation Needs: Not on file  Physical Activity: Not on file  Stress: Not on file  Social Connections: Not on file  Intimate Partner Violence: Not on file    Outpatient Medications Prior to Visit  Medication Sig Dispense Refill   albuterol (VENTOLIN HFA) 108 (90 Base) MCG/ACT inhaler Inhale 2 puffs into the lungs every 6 (six) hours as needed for wheezing or shortness of breath.     amLODipine (NORVASC) 5 MG tablet TAKE 1 TABLET DAILY (Patient taking differently: Take 5 mg by mouth daily.) 90 tablet 2   doxycycline (VIBRA-TABS) 100 MG tablet Take 1 tablet (100 mg total) by mouth 2 (two) times daily. 14 tablet 0   HYDROcodone-acetaminophen (NORCO/VICODIN) 5-325 MG tablet Take 1 tablet by mouth every 4 (four) hours as needed for severe pain. 30  tablet 0   metoprolol succinate (TOPROL-XL) 25 MG 24 hr tablet TAKE 1 TABLET DAILY (Patient taking differently: Take 25 mg by mouth daily.) 90 tablet 2   sertraline (ZOLOFT) 100 MG tablet Take 1 tablet (100 mg total) by mouth daily. 90 tablet 3   traZODone (DESYREL) 100 MG tablet Take 1-2 tablets (100-200 mg total) by mouth at bedtime as needed for sleep. Place on file 180 tablet 1   triamcinolone ointment (KENALOG) 0.5 % Apply 1 application topically 2 (two) times daily. 60 g 0   acetaminophen (TYLENOL) 500 MG tablet Take 1,000 mg by mouth every 8 (eight) hours as needed for moderate pain.     azithromycin (ZITHROMAX) 250 MG tablet 500 mg tablet by mouth daily, 250 mg tablet by mouth day 2-5 1 each 0   benzonatate (TESSALON PERLES) 100 MG capsule Take 1 capsule (100 mg total) by mouth 3 (three) times daily as needed for cough. 20 capsule 0   lidocaine (LIDODERM) 5 % Place 1 patch onto the skin daily. Remove & Discard patch within 12 hours or as directed by MD 30 patch prn   ondansetron (ZOFRAN) 4 MG tablet Take 1 tablet (4 mg total) by mouth daily as needed for nausea or vomiting. 60 tablet 1   predniSONE (STERAPRED UNI-PAK 21 TAB) 10 MG (21) TBPK tablet 6 tablet  day 1, 5 tablet day 2, 4 tablet day 3, 3 tablet daily for, 2 tablet day 5, 1 tablet day 6. 1 each 0   rosuvastatin (CRESTOR) 10 MG tablet Take 1 tablet (10 mg total) by mouth daily. 90 tablet 1   No facility-administered medications prior to visit.    Allergies  Allergen Reactions   Bee Pollen Anaphylaxis and Swelling   Bee Venom Anaphylaxis and Swelling   Butorphanol Other (See Comments)    Not sure told by md she was allergic after surgery.    Meloxicam Anxiety    Mood disorder Altered her personality    Penicillins Anaphylaxis and Hives    Unable to Recall As child mom was told she is highly allergic Did it involve swelling of the face/tongue/throat, SOB, or low BP? Unknown Did it involve sudden or severe rash/hives,  skin peeling, or any reaction on the inside of your mouth or nose? Unknown Did you need to seek medical attention at a hospital or doctor's office? Unknown When did it last happen?      childhood allergy If  all above answers are "NO", may proceed with cephalosporin use.    Prochlorperazine Edisylate Anaphylaxis   Sulfa Antibiotics Anaphylaxis    Unable to Recall   Tramadol Anxiety    Didn't like the way it made her feel    Topiramate Nausea And Vomiting    Review of Systems  Constitutional: Negative.   HENT: Negative.  Negative for sore throat.   Respiratory:  Negative for cough.   Gastrointestinal: Negative.   Musculoskeletal: Negative.   Skin:  Positive for rash.  All other systems reviewed and are negative.     Objective:    Physical Exam Vitals and nursing note reviewed.  Constitutional:      Appearance: Normal appearance.  HENT:     Head: Normocephalic.     Nose: Nose normal.     Mouth/Throat:     Mouth: Mucous membranes are moist.     Pharynx: Oropharynx is clear.  Eyes:     Conjunctiva/sclera: Conjunctivae normal.  Cardiovascular:     Rate and Rhythm: Normal rate and regular rhythm.     Pulses: Normal pulses.     Heart sounds: Normal heart sounds.  Pulmonary:     Effort: Pulmonary effort is normal.     Breath sounds: Normal breath sounds.  Abdominal:     General: Bowel sounds are normal.  Skin:    Findings: Rash present. Rash is urticarial.  Neurological:     Mental Status: She is alert and oriented to person, place, and time.    BP 135/78   Pulse 82   Temp 98.6 F (37 C) (Temporal)   Ht 5' 5" (1.651 m)   Wt 233 lb (105.7 kg)   SpO2 96%   BMI 38.77 kg/m  Wt Readings from Last 3 Encounters:  01/23/21 233 lb (105.7 kg)  01/18/21 233 lb (105.7 kg)  01/03/21 232 lb (105.2 kg)    Health Maintenance Due  Topic Date Due   Pneumococcal Vaccine 36-58 Years old (1 - PCV) Never done   Zoster Vaccines- Shingrix (1 of 2) Never done   MAMMOGRAM  Never  done   COVID-19 Vaccine (3 - Pfizer risk series) 12/03/2019    There are no preventive care reminders to display for this patient.   Lab Results  Component Value Date   TSH 1.110 05/29/2020   Lab Results  Component Value Date   WBC 5.2 07/06/2020   HGB 14.2 07/06/2020   HCT 45.0 07/06/2020   MCV 95.3 07/06/2020   PLT 254 07/06/2020   Lab Results  Component Value Date   NA 140 12/11/2020   K 4.5 12/11/2020   CO2 23 12/11/2020   GLUCOSE 91 12/11/2020   BUN 13 12/11/2020   CREATININE 0.62 12/11/2020   BILITOT 0.6 12/11/2020   ALKPHOS 103 12/11/2020   AST 11 12/11/2020   ALT 10 12/11/2020   PROT 6.3 12/11/2020   ALBUMIN 3.9 12/11/2020   CALCIUM 9.0 12/11/2020   ANIONGAP 6 07/06/2020   EGFR 101 12/11/2020   Lab Results  Component Value Date   CHOL 187 12/11/2020   Lab Results  Component Value Date   HDL 47 12/11/2020   Lab Results  Component Value Date   LDLCALC 127 (H) 12/11/2020   Lab Results  Component Value Date   TRIG 71 12/11/2020   Lab Results  Component Value Date   CHOLHDL 4.0 12/11/2020   Lab Results  Component Value Date   HGBA1C 5.5 10/01/2018  Assessment & Plan:   Problem List Items Addressed This Visit       Musculoskeletal and Integument   Rash - Primary    Patient recently treated with doxycycline for cellulitis of the left lower leg.  Patient has 2 more days to go but developed a rash 3 days ago.  After assessment patient skin rash looks like allergic dermatitis.  Most likely from antibiotic treatment.  Advised patient to complete dose, hydroxyzine 10 mg tablet by mouth, hydrocortisone cream, cool compress, Aquaphor emollient to keep skin moisturized.  Avoid direct sunlight.  Follow-up with worsening unresolved symptoms.       Relevant Medications   hydrOXYzine (ATARAX/VISTARIL) 10 MG tablet   hydrocortisone 1 % lotion     Meds ordered this encounter  Medications   hydrOXYzine (ATARAX/VISTARIL) 10 MG tablet    Sig:  Take 1 tablet (10 mg total) by mouth 3 (three) times daily as needed.    Dispense:  30 tablet    Refill:  0    Order Specific Question:   Supervising Provider    Answer:   Janora Norlander [[id number]]   hydrocortisone 1 % lotion    Sig: Apply 1 application topically 2 (two) times daily.    Dispense:  118 mL    Refill:  0    Order Specific Question:   Supervising Provider    Answer:   Janora Norlander [[id number]]     Ivy Lynn, NP

## 2021-01-23 NOTE — Assessment & Plan Note (Signed)
Patient recently treated with doxycycline for cellulitis of the left lower leg.  Patient has 2 more days to go but developed a rash 3 days ago.  After assessment patient skin rash looks like allergic dermatitis.  Most likely from antibiotic treatment.  Advised patient to complete dose, hydroxyzine 10 mg tablet by mouth, hydrocortisone cream, cool compress, Aquaphor emollient to keep skin moisturized.  Avoid direct sunlight.  Follow-up with worsening unresolved symptoms.

## 2021-01-25 ENCOUNTER — Other Ambulatory Visit: Payer: Self-pay | Admitting: Nurse Practitioner

## 2021-01-25 ENCOUNTER — Other Ambulatory Visit: Payer: Self-pay

## 2021-01-25 ENCOUNTER — Telehealth: Payer: Self-pay | Admitting: Family Medicine

## 2021-01-25 ENCOUNTER — Ambulatory Visit (INDEPENDENT_AMBULATORY_CARE_PROVIDER_SITE_OTHER): Payer: No Typology Code available for payment source

## 2021-01-25 DIAGNOSIS — L309 Dermatitis, unspecified: Secondary | ICD-10-CM

## 2021-01-25 MED ORDER — METHYLPREDNISOLONE ACETATE 40 MG/ML IJ SUSP
80.0000 mg | Freq: Once | INTRAMUSCULAR | Status: AC
  Administered 2021-01-25: 80 mg via INTRAMUSCULAR

## 2021-01-25 MED ORDER — PREDNISONE 10 MG (21) PO TBPK
ORAL_TABLET | ORAL | 0 refills | Status: DC
Start: 2021-01-25 — End: 2021-02-11

## 2021-01-25 MED ORDER — METHYLPREDNISOLONE ACETATE 80 MG/ML IJ SUSP: 80.0000 mg | Freq: Once | INTRAMUSCULAR | 0 refills | Status: DC

## 2021-01-25 NOTE — Progress Notes (Signed)
Methylprednisolone 80 mg given to left upper outer quadrant.  Patient tolerated well.

## 2021-01-25 NOTE — Telephone Encounter (Signed)
Spoke with patient and advised of instruction.  Patient will come now for Depo Medrol injection.

## 2021-01-25 NOTE — Telephone Encounter (Signed)
Pt called stating that she had an appt with Je on 01/23/21 about a rash and was told to call the office if the medicine she was prescribed didn't help.  Pt says she now has a rash from head to toe. The medicine did not work and needs help asap.  Please advise and call patient.

## 2021-01-26 ENCOUNTER — Encounter: Payer: Self-pay | Admitting: Nurse Practitioner

## 2021-01-28 ENCOUNTER — Encounter: Payer: Self-pay | Admitting: Family Medicine

## 2021-01-28 ENCOUNTER — Telehealth: Payer: No Typology Code available for payment source | Admitting: Family Medicine

## 2021-01-28 DIAGNOSIS — B379 Candidiasis, unspecified: Secondary | ICD-10-CM | POA: Diagnosis not present

## 2021-01-28 DIAGNOSIS — L239 Allergic contact dermatitis, unspecified cause: Secondary | ICD-10-CM | POA: Diagnosis not present

## 2021-01-28 MED ORDER — FLUCONAZOLE 150 MG PO TABS: ORAL_TABLET | ORAL | 0 refills | Status: DC

## 2021-01-28 NOTE — Progress Notes (Signed)
   Virtual Visit via video Note   Due to COVID-19 pandemic this visit was conducted virtually. This visit type was conducted due to national recommendations for restrictions regarding the COVID-19 Pandemic (e.g. social distancing, sheltering in place) in an effort to limit this patient's exposure and mitigate transmission in our community. All issues noted in this document were discussed and addressed.  A physical exam was not performed with this format.  I connected with  Sydney [name]  on 01/28/21 at 1350 by video and verified that I am speaking with the correct person using two identifiers. Sydney [name] is currently located at a family members house and her grandchildren are currently with her during the visit. The provider, Gabriel Earing, FNP is located in their office at time of visit.  I discussed the limitations, risks, security and privacy concerns of performing an evaluation and management service by video  and the availability of in person appointments. I also discussed with the patient that there may be a patient responsible charge related to this service. The patient expressed understanding and agreed to proceed.   History and Present Illness:  Sydney [name] has a rash due to an allergic reaction to doxycyline. She has had the rash for 5 days. The rash is itchy. She has been treated with a steroid shot, steroid dose pack, and hydroxyzine. She reports that overall the rash and itching is better expect for her hand. Her right hand and right arm still has the rash present. Her right hand is very itchy, dry, and cracked. She denies swelling, drainage, warmth, or fever. She has been using cerve to her hands with some improvement. Hydroxyzine has been helpful for the itching.   She also feels the beginning of a yeast infection coming on since completing the antibiotic. She reports some mild vaginal irritation, no other symptoms.   ROS As per HPI.     Observations/Objective: Rash consistent  with dermatitis to right hand and arm. No warmth or swelling noted. Respirations are unlabored. No angioedema.   Assessment and Plan: Sydney [name] was seen today for rash.  Diagnoses and all orders for this visit:  Allergic dermatitis Try kenalog cream, patient has this at home. Increase hydroxyzine to 20 mg if needed for itching. Discussed A&D ointment to hand for cracking and dryness.   Yeast infection -     fluconazole (DIFLUCAN) 150 MG tablet; Take 1 tablet now, may repeat in 1 week if needed.      Follow Up Instructions: Return to office for new or worsening symptoms, or if symptoms persist.      I discussed the assessment and treatment plan with the patient. The patient was provided an opportunity to ask questions and all were answered. The patient agreed with the plan and demonstrated an understanding of the instructions.   The patient was advised to call back or seek an in-person evaluation if the symptoms worsen or if the condition fails to improve as anticipated.  The above assessment and management plan was discussed with the patient. The patient verbalized understanding of and has agreed to the management plan. Patient is aware to call the clinic if symptoms persist or worsen. Patient is aware when to return to the clinic for a follow-up visit. Patient educated on when it is appropriate to go to the emergency department.   Time call ended: 1359  I provided 9 minutes of face-to-face time during this encounter.    Gabriel Earing, FNP

## 2021-02-05 ENCOUNTER — Ambulatory Visit: Payer: No Typology Code available for payment source | Admitting: Family Medicine

## 2021-02-10 ENCOUNTER — Encounter: Payer: Self-pay | Admitting: Family Medicine

## 2021-02-11 ENCOUNTER — Encounter: Payer: Self-pay | Admitting: Nurse Practitioner

## 2021-02-11 ENCOUNTER — Other Ambulatory Visit: Payer: Self-pay

## 2021-02-11 ENCOUNTER — Ambulatory Visit: Payer: No Typology Code available for payment source | Admitting: Nurse Practitioner

## 2021-02-11 VITALS — BP 108/71 | HR 75 | Temp 98.7°F | Resp 20 | Ht 65.0 in | Wt 233.0 lb

## 2021-02-11 DIAGNOSIS — G8929 Other chronic pain: Secondary | ICD-10-CM | POA: Diagnosis not present

## 2021-02-11 DIAGNOSIS — N3946 Mixed incontinence: Secondary | ICD-10-CM | POA: Diagnosis not present

## 2021-02-11 DIAGNOSIS — M545 Low back pain, unspecified: Secondary | ICD-10-CM | POA: Diagnosis not present

## 2021-02-11 MED ORDER — TOLTERODINE TARTRATE ER 4 MG PO CP24: 4.0000 mg | ORAL_CAPSULE | Freq: Every day | ORAL | 2 refills | Status: DC

## 2021-02-11 MED ORDER — KETOROLAC TROMETHAMINE 60 MG/2ML IM SOLN
60.0000 mg | Freq: Once | INTRAMUSCULAR | Status: AC
  Administered 2021-02-11: 60 mg via INTRAMUSCULAR

## 2021-02-11 NOTE — Patient Instructions (Signed)

## 2021-02-11 NOTE — Progress Notes (Signed)
   Subjective:    Patient ID: Sydney [name], female    DOB: 1960/11/12, 60 y.o.   MRN: [id number]   Chief Complaint: Back Pain (Since Friday/), Urinary Incontinence, and Tick Removal   HPI Patient comes in with 3 complaints: - low back pain- started Friday morning. She said she went to get out of bed and she could not stand up straight. Has gotten worse through the weekend. She has taken hydrocodone for pain. Rates pain 2/10 currently. Going from sitting to standing and walking increases pain. She has a history of back pain anyway.  - urinary incontinence- started intermittently for a couple of months. But in the last 3 weeks has gotten worse. She says she is leaking all day. She had bladder tacked in 2006 - tick bite- got tick off Friday morning. " Was a wood tick" was on there less than 48 hours.    Review of Systems  Genitourinary:  Negative for dysuria, enuresis, flank pain, frequency and hematuria.  Musculoskeletal:  Positive for back pain.      Objective:   Physical Exam Vitals and nursing note reviewed.  Constitutional:      Appearance: Normal appearance.  Cardiovascular:     Rate and Rhythm: Normal rate and regular rhythm.     Heart sounds: Normal heart sounds.  Pulmonary:     Breath sounds: Normal breath sounds.  Musculoskeletal:     Comments: rising slowly from sitting to standing. Using cane for stability. Limited ROM of lumbar spine with pain on extension and rotatiion to right. (-) SLR bil Motor strength and sensation distally intact.   Skin:    General: Skin is warm.     Comments: Small tick bite area on right elbow- mild erythema, no edema and no drainage.  Neurological:     Mental Status: She is alert.    BP 108/71   Pulse 75   Temp 98.7 F (37.1 C) (Temporal)   Resp 20   Ht 5\' 5"  (1.651 m)   Wt 233 lb (105.7 kg)   SpO2 96%   BMI 38.77 kg/m          Assessment & Plan:  Suan Pyeatt in today with chief complaint of Back Pain (Since  Friday/), Urinary Incontinence, and Tick Removal   1. Mixed stress and urge urinary incontinence Will start on detrol LA- if does not help will need to see urologist - tolterodine (DETROL LA) 4 MG 24 hr capsule; Take 1 capsule (4 mg total) by mouth daily.  Dispense: 30 capsule; Refill: 2  2. Chronic midline low back pain without sciatica Moist heat Rest Follow up with PCP if does not improve. - ketorolac (TORADOL) injection 60 mg    The above assessment and management plan was discussed with the patient. The patient verbalized understanding of and has agreed to the management plan. Patient is aware to call the clinic if symptoms persist or worsen. Patient is aware when to return to the clinic for a follow-up visit. Patient educated on when it is appropriate to go to the emergency department.   Sydney [name] 07-31-1971, FNP

## 2021-02-13 ENCOUNTER — Encounter: Payer: Self-pay | Admitting: Family Medicine

## 2021-02-14 ENCOUNTER — Encounter: Payer: Self-pay | Admitting: Family Medicine

## 2021-02-15 ENCOUNTER — Encounter: Payer: Self-pay | Admitting: Family Medicine

## 2021-02-15 ENCOUNTER — Ambulatory Visit: Payer: No Typology Code available for payment source | Admitting: Family Medicine

## 2021-02-15 DIAGNOSIS — L03116 Cellulitis of left lower limb: Secondary | ICD-10-CM | POA: Diagnosis not present

## 2021-02-15 DIAGNOSIS — I10 Essential (primary) hypertension: Secondary | ICD-10-CM

## 2021-02-15 MED ORDER — CLINDAMYCIN HCL 300 MG PO CAPS: 300.0000 mg | ORAL_CAPSULE | Freq: Four times a day (QID) | ORAL | 0 refills | Status: AC

## 2021-02-15 MED ORDER — METOPROLOL SUCCINATE ER 25 MG PO TB24: 25.0000 mg | ORAL_TABLET | Freq: Every day | ORAL | 2 refills | Status: DC

## 2021-02-15 MED ORDER — AMLODIPINE BESYLATE 5 MG PO TABS: 5.0000 mg | ORAL_TABLET | Freq: Every day | ORAL | 2 refills | Status: DC

## 2021-02-15 NOTE — Progress Notes (Signed)
Virtual Visit via video Note   Due to COVID-19 pandemic this visit was conducted virtually. This visit type was conducted due to national recommendations for restrictions regarding the COVID-19 Pandemic (e.g. social distancing, sheltering in place) in an effort to limit this patient's exposure and mitigate transmission in our community. All issues noted in this document were discussed and addressed.  A physical exam was not performed with this format.  I connected with  Sydney [name]  on 02/15/21 at 1648 by video and verified that I am speaking with the correct person using two identifiers. Sydney [name] is currently located at home and her family is currently with her during the visit. The provider, Gabriel Earing, FNP is located in their office at time of visit.  I discussed the limitations, risks, security and privacy concerns of performing an evaluation and management service by video  and the availability of in person appointments. I also discussed with the patient that there may be a patient responsible charge related to this service. The patient expressed understanding and agreed to proceed.  CC: cellulitis  History and Present Illness:  Chestine reports swelling and erythema to her left foot, ankle, and lower leg. She has been dealing with a rash for the last few months and has been following with dermatology for this. She had been waking up in the middle of the night scratching her legs. For the last 3 days her lower leg and foot has been swollen and red. The erythema is spreading up her leg. She denies pain or warmth. Denies fever or drainage. Her dermatologist has sent in a new steroid cream for her to try for her rash.   She also needs refills today on her BP meds. She saw her PCP a few months ago for her follow up. Denies shortness of breath or chest pain.     ROS As per HPI.     Observations/Objective: Alert. Respirations unlabored. Swelling with erythema to left foot to mid  calf noted. Diffuse rash noted as well.   Assessment and Plan: [name] was seen today for cellulitis.  Diagnoses and all orders for this visit:  Cellulitis of left lower extremity Will treat with clindamycin as below due to patient allergies. Return to office for new or worsening symptoms, or if symptoms persist. Continue follow up with dermatology.  -     clindamycin (CLEOCIN) 300 MG capsule; Take 1 capsule (300 mg total) by mouth 4 (four) times daily for 7 days.  Essential hypertension Refills provided. Recent BP in office was 108/71. Well controlled on current regimen.  -     amLODipine (NORVASC) 5 MG tablet; Take 1 tablet (5 mg total) by mouth daily. -     metoprolol succinate (TOPROL-XL) 25 MG 24 hr tablet; Take 1 tablet (25 mg total) by mouth daily.    Follow Up Instructions: Return to office for new or worsening symptoms, or if symptoms persist.     I discussed the assessment and treatment plan with the patient. The patient was provided an opportunity to ask questions and all were answered. The patient agreed with the plan and demonstrated an understanding of the instructions.   The patient was advised to call back or seek an in-person evaluation if the symptoms worsen or if the condition fails to improve as anticipated.  The above assessment and management plan was discussed with the patient. The patient verbalized understanding of and has agreed to the management plan. Patient is aware to call the  clinic if symptoms persist or worsen. Patient is aware when to return to the clinic for a follow-up visit. Patient educated on when it is appropriate to go to the emergency department.   Time call ended: 1700  I provided 12 minutes of video time during this encounter.    Gabriel Earing, FNP

## 2021-02-19 ENCOUNTER — Ambulatory Visit: Payer: No Typology Code available for payment source | Admitting: Nurse Practitioner

## 2021-02-19 ENCOUNTER — Other Ambulatory Visit: Payer: Self-pay

## 2021-02-19 ENCOUNTER — Encounter: Payer: Self-pay | Admitting: Nurse Practitioner

## 2021-02-19 VITALS — BP 127/80 | HR 71 | Temp 97.9°F | Ht 65.0 in | Wt 233.0 lb

## 2021-02-19 DIAGNOSIS — L03116 Cellulitis of left lower limb: Secondary | ICD-10-CM

## 2021-02-19 MED ORDER — FUROSEMIDE 20 MG PO TABS: 20.0000 mg | ORAL_TABLET | Freq: Every day | ORAL | 3 refills | Status: DC

## 2021-02-19 NOTE — Progress Notes (Signed)
   Subjective:    Patient ID: Sydney [name], female    DOB: [birth year], 60 y.o.   MRN: [id number]   Chief Complaint: Edema  (Left foot/)   HPI Patient come sin c/o left foot edema. Started several days ago. Has been intermittent every since she had covid in January. She did telephone visit wit Adin Hector, FNP and was put on clindamycin. The redness has gotten better but the swelling is still there.    Review of Systems  Constitutional:  Negative for diaphoresis.  Eyes:  Negative for pain.  Respiratory:  Negative for shortness of breath.   Cardiovascular:  Positive for leg swelling (left). Negative for chest pain and palpitations.  Gastrointestinal:  Negative for abdominal pain.  Endocrine: Negative for polydipsia.  Skin:  Negative for rash.  Neurological:  Negative for dizziness, weakness and headaches.  Hematological:  Does not bruise/bleed easily.  All other systems reviewed and are negative.     Objective:   Physical Exam Vitals reviewed.  Constitutional:      Appearance: Normal appearance. She is obese.  Cardiovascular:     Rate and Rhythm: Normal rate and regular rhythm.     Heart sounds: Normal heart sounds.  Pulmonary:     Effort: Pulmonary effort is normal.     Breath sounds: Normal breath sounds.  Skin:    General: Skin is warm.     Comments: Superficail open wound at ant base of left lower leg 2 + edema left lower leg with mild erythema  Neurological:     General: No focal deficit present.     Mental Status: She is alert and oriented to person, place, and time.    BP 127/80   Pulse 71   Temp 97.9 F (36.6 C) (Temporal)   Ht 5\' 5"  (1.651 m)   Wt 233 lb (105.7 kg)   SpO2 97%   BMI 38.77 kg/m        Assessment & Plan:   Sydney [name] in today with chief complaint of Edema  (Left foot/)  1. Cellulitis of left lower extremity Elevate leg when sittting Follow up friday - furosemide (LASIX) 20 MG tablet; Take 1 tablet (20 mg total) by mouth daily.   Dispense: 30 tablet; Refill: 3 - Apply unna boot    The above assessment and management plan was discussed with the patient. The patient verbalized understanding of and has agreed to the management plan. Patient is aware to call the clinic if symptoms persist or worsen. Patient is aware when to return to the clinic for a follow-up visit. Patient educated on when it is appropriate to go to the emergency department.   [name] [name], FNP

## 2021-02-19 NOTE — Patient Instructions (Signed)
Unna Boot Care An Unna boot is a type of bandage (dressing) for the foot and leg. The dressing is a gauze wrap that is soaked with a type of medicine called zinc oxide. The gauze may also include other lotions and medicines that help in wound healing, such as calamine. An Unna boot may be used to treat: Open sores (ulcers) on the foot, heel, or leg. Swelling from disorders that affect the veins or lymphatic system (lymphedema). Skin conditions such as chronic inflammation caused by poor blood flow (stasis dermatitis). The dressing is applied by a health care provider. The gauze is wrapped around your lower extremity in several layers, usually starting at the toes and going upward to the knee. A dry outer wrap goes over the medicated wrap for support and compression.  Before applying the Unna boot, your health care provider will clean your leg and foot and may apply an antibiotic ointment. You may be asked to raise (elevate) your leg for a while to reduce swelling before the boot is applied. The boot will dry and harden after it is applied. The boot may need to be changed or replaced about twice a week. Follow these instructions at home: Boot care Wear the Unna boot as told by your health care provider. You may need to wear a slipper or shoe over the boot that is one or two sizes larger than normal. Check the skin around the boot every day. Tell your health care provider about any concerns. Do not stick anything inside the boot to scratch your skin. Doing that increases your risk of infection. Keep your Unna boot clean and dry. Check every day for signs of infection. Check for: Redness, swelling, or pain in your foot or toes. Fluid or blood coming from the boot. Pus or a bad smell coming from the boot. Remove the boot and call your health care provider if you have signs of poor blood flow, such as: Your toes tingle or become numb. Your toes turn cold or turn blue or pale. Your toes are more  swollen or painful. You are unable to move your toes. Activity You may walk with the boot once it has dried. Ask your health care provider how much walking is safe for you. Avoid sitting for a long time without moving. Get up to take short walks as told by your health care provider. This is important to improve blood flow. Bathing Do not take baths, swim, or use a hot tub until your health care provider approves. Ask your health care provider if you may take showers. If your health care provider approves a bath or a shower, do not let the Unna boot get wet. If you take a shower, cover the boot with a watertight covering. If you take a bath, keep your leg with the boot out of the tub. General instructions Keep your leg elevated above the level of your heart while you are sitting or lying down. This will decrease swelling. Do not sit with your knee bent for long periods of time. Take over-the-counter and prescription medicines only as told by your health care provider. Do not use any products that contain nicotine or tobacco, such as cigarettes, e-cigarettes, and chewing tobacco. These can delay healing. If you need help quitting, ask your health care provider. Keep all follow-up visits as told by your health care provider. This is important. Contact a health care provider if: Your skin feels itchy inside the boot. You have a burning sensation, a   rash, or itchy, red, swollen areas of skin (hives) in the boot area. You have a fever or chills. You have any signs of infection, such as: New redness, swelling, or pain. More fluid or blood coming from the boot. Pus or a bad smell coming from the boot. You have increased numbness or pain in your foot or toes. You have any changes in skin color on your foot or toes, such as the skin turning blue or pale or developing patchy areas with spots. Your boot has been damaged or feels like it is no longer fitting properly. Summary An Unna boot is a type of  bandage (dressing) system for the foot and leg. The dressing is a gauze wrap that is soaked with a type of medicine (zinc oxide) to treat foot, heel, or leg ulcers, swelling from disorders that affect the veins or lymphatic system (lymphedema), and skin conditions caused by poor blood flow (stasis dermatitis). This dressing is applied by a health care provider. After it is applied, the boot will dry and harden. The boot may need to be changed or replaced about twice a week. Let your health care provider know if you have any signs of poor blood flow or infection. This information is not intended to replace advice given to you by your health care provider. Make sure you discuss any questions you have with your health care provider. Document Revised: 10/26/2018 Document Reviewed: 03/17/2018 [employer] Patient Education  2022 [employer]  

## 2021-02-21 ENCOUNTER — Ambulatory Visit (INDEPENDENT_AMBULATORY_CARE_PROVIDER_SITE_OTHER): Payer: No Typology Code available for payment source | Admitting: Orthopaedic Surgery

## 2021-02-21 ENCOUNTER — Encounter: Payer: Self-pay | Admitting: Orthopaedic Surgery

## 2021-02-21 ENCOUNTER — Other Ambulatory Visit: Payer: Self-pay

## 2021-02-21 VITALS — Ht 65.0 in | Wt 233.0 lb

## 2021-02-21 DIAGNOSIS — M545 Low back pain, unspecified: Secondary | ICD-10-CM

## 2021-02-21 DIAGNOSIS — M5137 Other intervertebral disc degeneration, lumbosacral region: Secondary | ICD-10-CM

## 2021-02-21 DIAGNOSIS — M4156 Other secondary scoliosis, lumbar region: Secondary | ICD-10-CM

## 2021-02-21 NOTE — Progress Notes (Signed)
Office Visit Note   Patient: Sydney [name]           Date of Birth: 02/25/1961           MRN: [id number] Visit Date: 02/21/2021              Requested by: Raliegh Ip, DO [address] [place],  [place] [address] PCP: Raliegh Ip, DO   Assessment & Plan: Visit Diagnoses:  1. Acute bilateral low back pain, unspecified whether sciatica present   2. Other secondary scoliosis, lumbar region   3. DDD (degenerative disc disease), lumbosacral     Plan: We will restart her physical therapy at her request.  Recheck 4 to 6 weeks.  She does have scoliosis foraminal stenosis.  We discussed when she gets out of the intermittent using surgical support stockings to help prevent recurrent problems with skin breakdown.  Follow-Up Instructions: No follow-ups on file.   Orders:  Orders Placed This Encounter  Procedures   Ambulatory referral to Physical Therapy   No orders of the defined types were placed in this encounter.     Procedures: No procedures performed   Clinical Data: No additional findings.   Subjective: Chief Complaint  Patient presents with   Lower Back - Pain    HPI 60 year old female returns for 6-week follow-up with ongoing problems with her back.  She had some foraminal stenosis on the left at L2-3 also L3-4.  She states she woke up 13 days ago lost control of her bladder.  She has had urgency in the morning for both bowels and bladder.  Bilateral total of arthroplasties done 2021 both doing well.  Previous cervical fusion done in [place].  Trochanteric injection 01/03/2021 gave her good relief.  She states when she is going to therapy she is doing much better but recently has been having more problems with weakness in her back and pain.  She has had Unna boot application for venous stasis left lower extremity patient still has some urgency with urination.  Review of Systems 14 point system update otherwise noncontributory.   Objective: Vital Signs: Ht 5'  5" (1.651 m)   Wt 233 lb (105.7 kg)   BMI 38.77 kg/m   Physical Exam Constitutional:      Appearance: She is well-developed.  HENT:     Head: Normocephalic.     Right Ear: External ear normal.     Left Ear: External ear normal. There is no impacted cerumen.  Eyes:     Pupils: Pupils are equal, round, and reactive to light.  Neck:     Thyroid: No thyromegaly.     Trachea: No tracheal deviation.  Cardiovascular:     Rate and Rhythm: Normal rate.  Pulmonary:     Effort: Pulmonary effort is normal.  Abdominal:     Palpations: Abdomen is soft.  Musculoskeletal:     Cervical back: No rigidity.  Skin:    General: Skin is warm and dry.  Neurological:     Mental Status: She is alert and oriented to person, place, and time.  Psychiatric:        Behavior: Behavior normal.    Ortho Exam negative logroll the hips knees reach full extension.  Quads are strong.  She uses her arms getting from sitting to standing.  Specialty Comments:  No specialty comments available.  Imaging: Narrative & Impression  CLINICAL DATA:  Chronic low back pain with right leg pain and numbness.   EXAM: MRI  LUMBAR SPINE WITHOUT CONTRAST   TECHNIQUE: Multiplanar, multisequence MR imaging of the lumbar spine was performed. No intravenous contrast was administered.   COMPARISON:  06/07/2014   FINDINGS: Segmentation: There are five lumbar type vertebral bodies. The last full intervertebral disc space is labeled L5-S1. This correlates with the prior study.   Alignment: Significant scoliotic curvature but normal overall alignment in the sagittal plane. Mild degenerative anterolisthesis of L5 is again demonstrated.   Vertebrae: Endplate reactive changes but no fractures or bone lesions. 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   Conus medullaris and cauda equina: Conus extends to the T12-L1 level. Conus and cauda equina appear  normal.   Paraspinal and other soft tissues: No significant paraspinal or retroperitoneal findings.   Disc levels:   L1-2: Asymmetric left-sided facet disease with thickening and buckling of the ligamentum flavum. There is mild mass effect on the left side of the thecal sac. There is also mild to moderate left lateral recess stenosis. No significant spinal or foraminal stenosis.   L2-3: Bulging annulus, osteophytic ridging and facet disease asymmetric on the left side. No significant spinal stenosis. Mild left lateral recess and left foraminal stenosis.   L3-4: Bulging annulus, osteophytic ridging and advanced facet disease contributing to early spinal stenosis and mild bilateral lateral recess stenosis. No significant foraminal stenosis. Mild left foraminal encroachment.   L4-5: Advanced facet disease, right greater than left in conjunction with a bulging annulus contributing to right lateral recess stenosis and mild right foraminal stenosis. No significant spinal or left foraminal stenosis.   L5-S1: Severe disc disease and facet disease but no focal disc protrusion or significant spinal or foraminal stenosis.   IMPRESSION: 1. Scoliosis and degenerative lumbar spondylosis with multilevel disc disease and facet disease. 2. Mild to moderate left lateral recess stenosis at L1-2. 3. Mild left lateral recess and left foraminal stenosis at L2-3. 4. Early spinal stenosis and mild bilateral lateral recess stenosis at L3-4. There is also mild left foraminal encroachment. 5. Right lateral recess stenosis and mild right foraminal stenosis at L4-5.     Electronically Signed   By: Rudie Meyer M.D.   On: 04/27/2020 15:01     PMFS History: Patient Active Problem List   Diagnosis Date Noted   Cellulitis of left leg 01/18/2021   Rash 01/18/2021   History of bilateral hip arthroplasty 01/03/2021   Upper respiratory infection with cough and congestion 12/30/2020   Non-recurrent  acute serous otitis media of right ear 12/30/2020   Sore throat 10/29/2020   Trochanteric bursitis, left hip 09/13/2020   Closed fracture of multiple pubic rami, left, sequela 08/16/2020   H/O adenomatous polyp of colon 05/23/2020   FH: colon cancer in relative diagnosed at >86 years old 05/23/2020   Spinal stenosis of lumbar region 05/03/2020   Impingement syndrome of right shoulder 05/03/2020   Other secondary scoliosis, lumbar region 03/01/2020   H/O total hip arthroplasty 12/06/2019   Arthritis of right hip 12/05/2019   Status post total replacement of left hip 09/15/2019   Back pain 07/01/2018   DDD (degenerative disc disease), lumbosacral 07/01/2018   Venous insufficiency of both lower extremities 11/17/2016   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 level with radiculopathy 08/18/2016   Morbid obesity with BMI of 40.0-44.9, adult (HCC) 07/28/2016   Chronic pain syndrome 10/24/2015   Dysfunction of both eustachian tubes 07/26/2014   Calculus of left kidney 04/25/2014   Impaired fasting glucose 04/05/2014  History of bladder surgery 04/03/2014   History of hysterectomy 04/03/2014   Urge incontinence of urine 04/03/2014   Asthma 07/28/2013   Nontoxic uninodular goiter 07/28/2013   Solitary pulmonary nodule 07/28/2013   Lumbar radiculopathy 04/20/2013   Insomnia 03/22/2013   Essential hypertension 08/18/2011   Tobacco use disorder 08/18/2011   Moderate episode of recurrent major depressive disorder (HCC) 04/03/2011   Other B-complex deficiencies 08/16/2010   Family history of cerebrovascular accident (CVA) 04/17/2010   Family history of malignant neoplasm of gastrointestinal tract 04/17/2010   Family history of other cardiovascular diseases(V17.49) 04/17/2010   Past Medical History:  Diagnosis Date   Allergy    Anemia    history of   Anxiety    Arthritis    Asthma    Atherosclerosis    Depression    Family history of adverse reaction to anesthesia    pt's mother felt  everything and couldn't speak during a gallbladder surgery   GERD (gastroesophageal reflux disease)    History of hiatal hernia    History of kidney stones    Hypertension    Pneumonia    a few times   TIA (transient ischemic attack) 2012    Family History  Problem Relation Age of Onset   Anxiety disorder Mother    Depression Mother    Heart disease Mother    Hypertension Mother    Arrhythmia Mother    Cancer Father        colon, age greater than 79   Lung cancer Father    Migraines Sister    Alcohol abuse Brother    Cancer Daughter        nerve sheath sarcoma    Past Surgical History:  Procedure Laterality Date   ABDOMINAL HYSTERECTOMY     abdominal   APPENDECTOMY     bladder tack     CARPAL TUNNEL RELEASE Bilateral    CERVICAL SPINE SURGERY     5-6, 6-7   CHOLECYSTECTOMY     COLONOSCOPY WITH PROPOFOL N/A 07/10/2020   Procedure: COLONOSCOPY WITH PROPOFOL;  Surgeon: Lanelle Bal, DO;  Location: [facility];  Service: Endoscopy;  Laterality: N/A;  12:15pm   HAMMER TOE SURGERY Right    JOINT REPLACEMENT     TOTAL HIP 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   Procedure: LEFT TOTAL HIP ARTHROPLASTY -DIRECT ANTERIOR;  Surgeon: Eldred Manges, MD;  Location: [facility] OR;  Service: Orthopedics;  Laterality: Left;   TOTAL HIP ARTHROPLASTY Right 12/05/2019   Procedure: RIGHT TOTAL HIP ARTHROPLASTY ANTERIOR APPROACH  DIRECT ANTERIOR;  Surgeon: Eldred Manges, MD;  Location: [facility] OR;  Service: Orthopedics;  Laterality: Right;   Social History   Occupational History   Not on file  Tobacco Use   Smoking status: Every Day    Packs/day: 0.50    Years: 23.00    Pack years: 11.50    Types: Cigarettes   Smokeless tobacco: Never  Vaping Use   Vaping Use: Never used  Substance and Sexual Activity   Alcohol use: Yes    Comment: occ   Drug use: Never   Sexual activity: Not Currently

## 2021-02-22 ENCOUNTER — Encounter: Payer: Self-pay | Admitting: Family Medicine

## 2021-02-22 ENCOUNTER — Ambulatory Visit: Payer: No Typology Code available for payment source | Admitting: Family Medicine

## 2021-02-22 ENCOUNTER — Other Ambulatory Visit: Payer: Self-pay

## 2021-02-22 VITALS — BP 127/76 | HR 67 | Temp 97.9°F | Ht 65.0 in | Wt 237.2 lb

## 2021-02-22 DIAGNOSIS — L03116 Cellulitis of left lower limb: Secondary | ICD-10-CM

## 2021-02-22 DIAGNOSIS — F32A Depression, unspecified: Secondary | ICD-10-CM

## 2021-02-22 DIAGNOSIS — F329 Major depressive disorder, single episode, unspecified: Secondary | ICD-10-CM

## 2021-02-22 MED ORDER — SERTRALINE HCL 100 MG PO TABS: 150.0000 mg | ORAL_TABLET | Freq: Every day | ORAL | 3 refills | Status: DC

## 2021-02-22 NOTE — Progress Notes (Signed)
Subjective: FH:[id number] PCP: Sydney Norlander, DO  HTD:SKAJ [name] is a 60 y.o. female presenting to clinic today [name] and depression  Pt presents today for reevaluation of LLE cellulitis and UNNA boot removal. She was started on clindamycin, has been taking as prescribed. UNNA boot was applied on 02/19/2021. She states the swelling in her LLE has improved greatly. She did not take lasix as prescribed due to allergies. She denies systemic signs of infection such as fever, chills, weakness, or confusion.   Depression      The patient presents with depression.  This is a recurrent problem.  The current episode started more than 1 month ago.   The onset quality is gradual.   The problem occurs daily.  The problem has been gradually worsening since onset.  Associated symptoms include fatigue, helplessness, hopelessness, appetite change and sad.  Associated symptoms include no decreased concentration, does not have insomnia, not irritable, no restlessness, no decreased interest, no body aches, no myalgias, no headaches, no indigestion and no suicidal ideas.     The symptoms are aggravated by family issues and social issues.  Past treatments include SSRIs - Selective serotonin reuptake inhibitors.  Compliance with treatment is good.  Previous treatment provided moderate relief.  Risk factors include major life event and stress.   Past medical history includes chronic pain, chronic illness, anxiety and depression.   Depression screen [facility] 2/9 02/22/2021 02/19/2021 02/11/2021 01/18/2021 12/11/2020  Decreased Interest 0 0 0 1 1  Down, Depressed, Hopeless 0 0 0 1 0  PHQ - 2 Score 0 0 0 2 1  Altered sleeping 0 - 0 2 0  Tired, decreased energy 1 - 1 1 1   Change in appetite 1 - 1 1 1   Feeling bad or failure about yourself  1 - 1 1 1   Trouble concentrating 0 - 0 1 1  Moving slowly or fidgety/restless 0 - 0 0 1  Suicidal thoughts 0 - 0 0 0  PHQ-9 Score 3 - 3 8 6   Difficult doing work/chores  Somewhat difficult - Somewhat difficult Not difficult at all -  Some recent data might be hidden      Relevant past medical, surgical, family, and social history reviewed and updated as indicated.  Allergies and medications reviewed and updated.  Allergies  Allergen Reactions   Bee Pollen Anaphylaxis and Swelling   Bee Venom Anaphylaxis and Swelling   Butorphanol Other (See Comments)    Not sure told by md she was allergic after surgery.    Meloxicam Anxiety    Mood disorder Altered her personality    Penicillins Anaphylaxis and Hives    Unable to Recall As child mom was told she is highly allergic Did it involve swelling of the face/tongue/throat, SOB, or low BP? Unknown Did it involve sudden or severe rash/hives, skin peeling, or any reaction on the inside of your mouth or nose? Unknown Did you need to seek medical attention at a hospital or doctor's office? Unknown When did it last happen?      childhood allergy If all above answers are "NO", may proceed with cephalosporin use.    Prochlorperazine Edisylate Anaphylaxis   Sulfa Antibiotics Anaphylaxis    Unable to Recall   Tramadol Anxiety    Didn't like the way it made her feel    Topiramate Nausea And Vomiting   Doxycycline Dermatitis, Hives, Itching, Photosensitivity and Rash   Past Medical History:  Diagnosis Date   Allergy    Anemia  history of   Anxiety    Arthritis    Asthma    Atherosclerosis    Depression    Family history of adverse reaction to anesthesia    pt's mother felt everything and couldn't speak during a gallbladder surgery   GERD (gastroesophageal reflux disease)    History of hiatal hernia    History of kidney stones    Hypertension    Pneumonia    a few times   TIA (transient ischemic attack) 2012    Current Outpatient Medications:    albuterol (VENTOLIN HFA) 108 (90 Base) MCG/ACT inhaler, Inhale 2 puffs into the lungs every 6 (six) hours as needed for wheezing or shortness of  breath., Disp: , Rfl:    amLODipine (NORVASC) 5 MG tablet, Take 1 tablet (5 mg total) by mouth daily., Disp: 90 tablet, Rfl: 2   augmented betamethasone dipropionate (DIPROLENE-AF) 0.05 % cream, Apply topically., Disp: , Rfl:    cetirizine (ZYRTEC) 10 MG tablet, Take 10 mg by mouth daily., Disp: , Rfl:    clindamycin (CLEOCIN) 300 MG capsule, Take 1 capsule (300 mg total) by mouth 4 (four) times daily for 7 days., Disp: 28 capsule, Rfl: 0   fluconazole (DIFLUCAN) 150 MG tablet, Take 1 tablet now, may repeat in 1 week if needed., Disp: 2 tablet, Rfl: 0   HYDROcodone-acetaminophen (NORCO/VICODIN) 5-325 MG tablet, Take 1 tablet by mouth every 4 (four) hours as needed for severe pain., Disp: 30 tablet, Rfl: 0   hydrocortisone 1 % lotion, Apply 1 application topically 2 (two) times daily., Disp: 118 mL, Rfl: 0   hydrOXYzine (ATARAX/VISTARIL) 10 MG tablet, Take 1 tablet (10 mg total) by mouth 3 (three) times daily as needed., Disp: 30 tablet, Rfl: 0   metoprolol succinate (TOPROL-XL) 25 MG 24 hr tablet, Take 1 tablet (25 mg total) by mouth daily., Disp: 90 tablet, Rfl: 2   tolterodine (DETROL LA) 4 MG 24 hr capsule, Take 1 capsule (4 mg total) by mouth daily., Disp: 30 capsule, Rfl: 2   traZODone (DESYREL) 100 MG tablet, Take 1-2 tablets (100-200 mg total) by mouth at bedtime as needed for sleep. Place on file, Disp: 180 tablet, Rfl: 1   triamcinolone ointment (KENALOG) 0.5 %, Apply 1 application topically 2 (two) times daily., Disp: 60 g, Rfl: 0   furosemide (LASIX) 20 MG tablet, Take 1 tablet (20 mg total) by mouth daily. (Patient not taking: Reported on 02/22/2021), Disp: 30 tablet, Rfl: 3   sertraline (ZOLOFT) 100 MG tablet, Take 1.5 tablets (150 mg total) by mouth daily., Disp: 90 tablet, Rfl: 3 Social History   Socioeconomic History   Marital status: Married    Spouse name: Not on file   Number of children: 3   Years of education: Not on file   Highest education level: Not on file  Occupational  History   Not on file  Tobacco Use   Smoking status: Every Day    Packs/day: 0.50    Years: 23.00    Pack years: 11.50    Types: Cigarettes   Smokeless tobacco: Never  Vaping Use   Vaping Use: Never used  Substance and Sexual Activity   Alcohol use: Yes    Comment: occ   Drug use: Never   Sexual activity: Not Currently  Other Topics Concern   Not on file  Social History Narrative   Recently relocated to [place] from [place].  She resides at home with her husband.   She has 3  children but 1 passed away from cancer.   Social Determinants of Health   Financial Resource Strain: Not on file  Food Insecurity: Not on file  Transportation Needs: Not on file  Physical Activity: Not on file  Stress: Not on file  Social Connections: Not on file  Intimate Partner Violence: Not on file   Family History  Problem Relation Age of Onset   Anxiety disorder Mother    Depression Mother    Heart disease Mother    Hypertension Mother    Arrhythmia Mother    Cancer Father        colon, age greater than 46   Lung cancer Father    Migraines Sister    Alcohol abuse Brother    Cancer Daughter        nerve sheath sarcoma    Review of Systems  Constitutional:  Positive for appetite change and fatigue. Negative for activity change, chills, diaphoresis, fever and unexpected weight change.  HENT: Negative.    Eyes: Negative.   Respiratory:  Negative for cough, chest tightness and shortness of breath.   Cardiovascular:  Negative for chest pain, palpitations and leg swelling.  Gastrointestinal:  Negative for abdominal pain, blood in stool, constipation, diarrhea, nausea and vomiting.  Endocrine: Negative.   Genitourinary:  Negative for decreased urine volume, dysuria, frequency and urgency.  Musculoskeletal:  Negative for arthralgias and myalgias.  Skin:  Positive for color change and wound. Negative for pallor and rash.  Allergic/Immunologic: Negative.   Neurological:  Negative for  dizziness, weakness and headaches.  Hematological: Negative.   Psychiatric/Behavioral:  Positive for depression and dysphoric mood. Negative for agitation, behavioral problems, confusion, decreased concentration, hallucinations, self-injury, sleep disturbance and suicidal ideas. The patient is not nervous/anxious, does not have insomnia and is not hyperactive.   All other systems reviewed and are negative.   Objective: Office vital signs reviewed. BP 127/76   Pulse 67   Temp 97.9 F (36.6 C) (Temporal)   Ht 5' 5"  (1.651 m)   Wt 237 lb 3.2 oz (107.6 kg)   SpO2 97%   BMI 39.47 kg/m   Physical Examination:  Physical Exam Vitals and nursing note reviewed.  Constitutional:      General: She is not irritable.She is not in acute distress.    Appearance: Normal appearance. She is obese. She is not ill-appearing, toxic-appearing or diaphoretic.  HENT:     Head: Normocephalic and atraumatic.  Eyes:     Conjunctiva/sclera: Conjunctivae normal.     Pupils: Pupils are equal, round, and reactive to light.  Cardiovascular:     Rate and Rhythm: Normal rate and regular rhythm.     Pulses: Normal pulses.     Heart sounds: Normal heart sounds.  Pulmonary:     Effort: Pulmonary effort is normal.     Breath sounds: Normal breath sounds.  Musculoskeletal:        General: Normal range of motion.     Cervical back: Neck supple.  Skin:    General: Skin is warm.     Capillary Refill: Capillary refill takes less than 2 seconds.     Findings: Erythema and wound present.       Neurological:     General: No focal deficit present.     Mental Status: She is alert and oriented to person, place, and time.     Cranial Nerves: No cranial nerve deficit.     Sensory: No sensory deficit.     Motor: No weakness.  Coordination: Coordination normal.     Gait: Gait normal.     Deep Tendon Reflexes: Reflexes normal.  Psychiatric:        Mood and Affect: Mood normal.        Behavior: Behavior normal.         Thought Content: Thought content normal.        Judgment: Judgment normal.     Results for orders placed or performed in visit on 12/11/20  Lipid Panel  Result Value Ref Range   Cholesterol, Total 187 100 - 199 mg/dL   Triglycerides 71 0 - 149 mg/dL   HDL 47 >39 mg/dL   VLDL Cholesterol Cal 13 5 - 40 mg/dL   LDL Chol Calc (NIH) 127 (H) 0 - 99 mg/dL   Chol/HDL Ratio 4.0 0.0 - 4.4 ratio  CMP14+EGFR  Result Value Ref Range   Glucose 91 65 - 99 mg/dL   BUN 13 8 - 27 mg/dL   Creatinine, Ser 0.62 0.57 - 1.00 mg/dL   eGFR 101 >59 mL/min/1.73   BUN/Creatinine Ratio 21 12 - 28   Sodium 140 134 - 144 mmol/L   Potassium 4.5 3.5 - 5.2 mmol/L   Chloride 104 96 - 106 mmol/L   CO2 23 20 - 29 mmol/L   Calcium 9.0 8.7 - 10.3 mg/dL   Total Protein 6.3 6.0 - 8.5 g/dL   Albumin 3.9 3.8 - 4.8 g/dL   Globulin, Total 2.4 1.5 - 4.5 g/dL   Albumin/Globulin Ratio 1.6 1.2 - 2.2   Bilirubin Total 0.6 0.0 - 1.2 mg/dL   Alkaline Phosphatase 103 44 - 121 IU/L   AST 11 0 - 40 IU/L   ALT 10 0 - 32 IU/L     Assessment/ Plan: Sydney was seen today for cellulitis.  Diagnoses and all orders for this visit:  Cellulitis of left lower extremity Healing well. Complete course of antibiotics. Will reapply UNNA boot, pt to have removed on Tuesday.  -     Apply unna boot  Depressive disorder Increasing depressive symptoms over the last several months. No SI/HI. Will increase sertraline dose today. Follow up in 4-6 weeks for reevaluation. Report any new or worsening symptoms.  -     sertraline (ZOLOFT) 100 MG tablet; Take 1.5 tablets (150 mg total) by mouth daily.    Continue all other maintenance medications.  Follow up plan: Return in about 4 days (around 02/26/2021), or if symptoms worsen or fail to improve.   The above assessment and management plan was discussed with the patient. The patient verbalized understanding of and has agreed to the management plan. Patient is aware to call the clinic if  symptoms persist or worsen. Patient is aware when to return to the clinic for a follow-up visit. Patient educated on when it is appropriate to go to the emergency department.   Monia Pouch, FNP-C [facility] [address] [place], [place] [address] [phone]

## 2021-02-26 ENCOUNTER — Encounter: Payer: Self-pay | Admitting: Family Medicine

## 2021-02-26 ENCOUNTER — Ambulatory Visit: Payer: No Typology Code available for payment source | Admitting: Family Medicine

## 2021-02-26 ENCOUNTER — Other Ambulatory Visit: Payer: Self-pay

## 2021-02-26 VITALS — BP 126/80 | HR 69 | Temp 98.1°F | Ht 65.0 in | Wt 234.0 lb

## 2021-02-26 DIAGNOSIS — L03116 Cellulitis of left lower limb: Secondary | ICD-10-CM | POA: Diagnosis not present

## 2021-02-26 NOTE — Progress Notes (Signed)
Subjective: CC: Follow-up leg cellulitis PCP: Raliegh Ip, DO TKZ:SWFU Stumpo is a 60 y.o. female presenting to clinic today for:  1.  Cellulitis Patient with diagnosis cellulitis in the left lower extremity.  She was seen recently and placed in an [employer].  She notes that the swelling has gotten better and it is less itchy now that the swelling has gone down.  She status post treatment with clindamycin orally.  No reports of fever.  Erythema looks to be baseline now.   ROS: Per HPI  Allergies  Allergen Reactions   Bee Pollen Anaphylaxis and Swelling   Bee Venom Anaphylaxis and Swelling   Butorphanol Other (See Comments)    Not sure told by md she was allergic after surgery.    Meloxicam Anxiety    Mood disorder Altered her personality    Penicillins Anaphylaxis and Hives    Unable to Recall As child mom was told she is highly allergic Did it involve swelling of the face/tongue/throat, SOB, or low BP? Unknown Did it involve sudden or severe rash/hives, skin peeling, or any reaction on the inside of your mouth or nose? Unknown Did you need to seek medical attention at a hospital or doctor's office? Unknown When did it last happen?      childhood allergy If all above answers are "NO", may proceed with cephalosporin use.    Prochlorperazine Edisylate Anaphylaxis   Sulfa Antibiotics Anaphylaxis    Unable to Recall   Tramadol Anxiety    Didn't like the way it made her feel    Topiramate Nausea And Vomiting   Doxycycline Dermatitis, Hives, Itching, Photosensitivity and Rash   Past Medical History:  Diagnosis Date   Allergy    Anemia    history of   Anxiety    Arthritis    Asthma    Atherosclerosis    Depression    Family history of adverse reaction to anesthesia    pt's mother felt everything and couldn't speak during a gallbladder surgery   GERD (gastroesophageal reflux disease)    History of hiatal hernia    History of kidney stones    Hypertension     Pneumonia    a few times   TIA (transient ischemic attack) 2012    Current Outpatient Medications:    albuterol (VENTOLIN HFA) 108 (90 Base) MCG/ACT inhaler, Inhale 2 puffs into the lungs every 6 (six) hours as needed for wheezing or shortness of breath., Disp: , Rfl:    amLODipine (NORVASC) 5 MG tablet, Take 1 tablet (5 mg total) by mouth daily., Disp: 90 tablet, Rfl: 2   augmented betamethasone dipropionate (DIPROLENE-AF) 0.05 % cream, Apply topically., Disp: , Rfl:    cetirizine (ZYRTEC) 10 MG tablet, Take 10 mg by mouth daily., Disp: , Rfl:    fluconazole (DIFLUCAN) 150 MG tablet, Take 1 tablet now, may repeat in 1 week if needed., Disp: 2 tablet, Rfl: 0   HYDROcodone-acetaminophen (NORCO/VICODIN) 5-325 MG tablet, Take 1 tablet by mouth every 4 (four) hours as needed for severe pain., Disp: 30 tablet, Rfl: 0   hydrocortisone 1 % lotion, Apply 1 application topically 2 (two) times daily., Disp: 118 mL, Rfl: 0   hydrOXYzine (ATARAX/VISTARIL) 10 MG tablet, Take 1 tablet (10 mg total) by mouth 3 (three) times daily as needed., Disp: 30 tablet, Rfl: 0   metoprolol succinate (TOPROL-XL) 25 MG 24 hr tablet, Take 1 tablet (25 mg total) by mouth daily., Disp: 90 tablet, Rfl: 2  sertraline (ZOLOFT) 100 MG tablet, Take 1.5 tablets (150 mg total) by mouth daily., Disp: 90 tablet, Rfl: 3   tolterodine (DETROL LA) 4 MG 24 hr capsule, Take 1 capsule (4 mg total) by mouth daily., Disp: 30 capsule, Rfl: 2   traZODone (DESYREL) 100 MG tablet, Take 1-2 tablets (100-200 mg total) by mouth at bedtime as needed for sleep. Place on file, Disp: 180 tablet, Rfl: 1   triamcinolone ointment (KENALOG) 0.5 %, Apply 1 application topically 2 (two) times daily., Disp: 60 g, Rfl: 0   furosemide (LASIX) 20 MG tablet, Take 1 tablet (20 mg total) by mouth daily. (Patient not taking: No sig reported), Disp: 30 tablet, Rfl: 3 Social History   Socioeconomic History   Marital status: Married    Spouse name: Not on file    Number of children: 3   Years of education: Not on file   Highest education level: Not on file  Occupational History   Not on file  Tobacco Use   Smoking status: Every Day    Packs/day: 0.50    Years: 23.00    Pack years: 11.50    Types: Cigarettes   Smokeless tobacco: Never  Vaping Use   Vaping Use: Never used  Substance and Sexual Activity   Alcohol use: Yes    Comment: occ   Drug use: Never   Sexual activity: Not Currently  Other Topics Concern   Not on file  Social History Narrative   Recently relocated to [place] from [place].  She resides at home with her husband.   She has 3 children but 1 passed away from cancer.   Social Determinants of Health   Financial Resource Strain: Not on file  Food Insecurity: Not on file  Transportation Needs: Not on file  Physical Activity: Not on file  Stress: Not on file  Social Connections: Not on file  Intimate Partner Violence: Not on file   Family History  Problem Relation Age of Onset   Anxiety disorder Mother    Depression Mother    Heart disease Mother    Hypertension Mother    Arrhythmia Mother    Cancer Father        colon, age greater than 26   Lung cancer Father    Migraines Sister    Alcohol abuse Brother    Cancer Daughter        nerve sheath sarcoma    Objective: Office vital signs reviewed. BP 126/80   Pulse 69   Temp 98.1 F (36.7 C)   Ht 5\' 5"  (1.651 m)   Wt 234 lb (106.1 kg)   SpO2 97%   BMI 38.94 kg/m   Physical Examination:  General: Awake, alert, well nourished, No acute distress Skin: Discoloration of the skin noted in left lower extremity, particularly along the medial ankle and foot.  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  Minimal warmth to the left lower extremity compared to the right.  No exudates.  Assessment/ Plan: 60 y.o. female   Cellulitis of left lower extremity  Improved left lower extremity infection and swelling.  Discussed ongoing monitoring  closely and low threshold to contact me if any concerning symptoms or signs arise.  Keep follow-up with Dr. 77 for back issues.  No orders of the defined types were placed in this encounter.  No orders of the defined types were placed in this encounter.    Ophelia Charter, DO [facility] [phone])  [phone]   

## 2021-03-06 ENCOUNTER — Ambulatory Visit: Payer: No Typology Code available for payment source | Admitting: Family Medicine

## 2021-03-06 ENCOUNTER — Other Ambulatory Visit: Payer: Self-pay

## 2021-03-06 ENCOUNTER — Encounter: Payer: Self-pay | Admitting: Family Medicine

## 2021-03-06 VITALS — BP 128/87 | HR 85 | Temp 97.8°F | Ht 65.0 in | Wt 232.0 lb

## 2021-03-06 DIAGNOSIS — E7800 Pure hypercholesterolemia, unspecified: Secondary | ICD-10-CM | POA: Diagnosis not present

## 2021-03-06 DIAGNOSIS — N3946 Mixed incontinence: Secondary | ICD-10-CM

## 2021-03-06 DIAGNOSIS — I10 Essential (primary) hypertension: Secondary | ICD-10-CM

## 2021-03-06 NOTE — Patient Instructions (Signed)
Try taking the Rosuvastatin every OTHER morning and we can see if that will be enough to reduce your cholesterol.  Your current 10 year risk of a stroke/ heart attack is 10.4%.  This is HIGH.  The 10-year ASCVD risk score Denman George DC Montez Hageman., et al., 2013) is: 10.4%   Values used to calculate the score:     Age: 61 years     Sex: Female     Is Non-Hispanic African American: No     Diabetic: No     Tobacco smoker: Yes     Systolic Blood Pressure: 128 mmHg     Is BP treated: Yes     HDL Cholesterol: 47 mg/dL     Total Cholesterol: 187 mg/dL

## 2021-03-06 NOTE — Progress Notes (Signed)
Subjective: CC: Hypertension, hyperlipidemia PCP: Raliegh Ip, DO AFB:Sydney [name] is a 60 y.o. female presenting to clinic today for:  1.  Hypertension with hyperlipidemia Patient is compliant with all medications except for her Crestor.  She notes that the Crestor caused some insomnia and she discontinued it.  No chest pain, shortness of breath; continues to have some issues with incontinence     ROS: Per HPI  Allergies  Allergen Reactions   Bee Pollen Anaphylaxis and Swelling   Bee Venom Anaphylaxis and Swelling   Butorphanol Other (See Comments)    Not sure told by md she was allergic after surgery.    Meloxicam Anxiety    Mood disorder Altered her personality    Penicillins Anaphylaxis and Hives    Unable to Recall As child mom was told she is highly allergic Did it involve swelling of the face/tongue/throat, SOB, or low BP? Unknown Did it involve sudden or severe rash/hives, skin peeling, or any reaction on the inside of your mouth or nose? Unknown Did you need to seek medical attention at a hospital or doctor's office? Unknown When did it last happen?      childhood allergy If all above answers are "NO", may proceed with cephalosporin use.    Prochlorperazine Edisylate Anaphylaxis   Sulfa Antibiotics Anaphylaxis    Unable to Recall   Tramadol Anxiety    Didn't like the way it made her feel    Topiramate Nausea And Vomiting   Doxycycline Dermatitis, Hives, Itching, Photosensitivity and Rash   Past Medical History:  Diagnosis Date   Allergy    Anemia    history of   Anxiety    Arthritis    Asthma    Atherosclerosis    Depression    Family history of adverse reaction to anesthesia    pt's mother felt everything and couldn't speak during a gallbladder surgery   GERD (gastroesophageal reflux disease)    History of hiatal hernia    History of kidney stones    Hypertension    Pneumonia    a few times   TIA (transient ischemic attack) 2012     Current Outpatient Medications:    albuterol (VENTOLIN HFA) 108 (90 Base) MCG/ACT inhaler, Inhale 2 puffs into the lungs every 6 (six) hours as needed for wheezing or shortness of breath., Disp: , Rfl:    amLODipine (NORVASC) 5 MG tablet, Take 1 tablet (5 mg total) by mouth daily., Disp: 90 tablet, Rfl: 2   augmented betamethasone dipropionate (DIPROLENE-AF) 0.05 % cream, Apply topically., Disp: , Rfl:    cetirizine (ZYRTEC) 10 MG tablet, Take 10 mg by mouth daily., Disp: , Rfl:    HYDROcodone-acetaminophen (NORCO/VICODIN) 5-325 MG tablet, Take 1 tablet by mouth every 4 (four) hours as needed for severe pain., Disp: 30 tablet, Rfl: 0   hydrOXYzine (ATARAX/VISTARIL) 10 MG tablet, Take 1 tablet (10 mg total) by mouth 3 (three) times daily as needed., Disp: 30 tablet, Rfl: 0   metoprolol succinate (TOPROL-XL) 25 MG 24 hr tablet, Take 1 tablet (25 mg total) by mouth daily., Disp: 90 tablet, Rfl: 2   sertraline (ZOLOFT) 100 MG tablet, Take 1.5 tablets (150 mg total) by mouth daily., Disp: 90 tablet, Rfl: 3   tolterodine (DETROL LA) 4 MG 24 hr capsule, Take 1 capsule (4 mg total) by mouth daily., Disp: 30 capsule, Rfl: 2   traZODone (DESYREL) 100 MG tablet, Take 1-2 tablets (100-200 mg total) by mouth at bedtime as needed  for sleep. Place on file, Disp: 180 tablet, Rfl: 1 Social History   Socioeconomic History   Marital status: Married    Spouse name: Not on file   Number of children: 3   Years of education: Not on file   Highest education level: Not on file  Occupational History   Not on file  Tobacco Use   Smoking status: Every Day    Packs/day: 0.50    Years: 23.00    Pack years: 11.50    Types: Cigarettes   Smokeless tobacco: Never  Vaping Use   Vaping Use: Never used  Substance and Sexual Activity   Alcohol use: Yes    Comment: occ   Drug use: Never   Sexual activity: Not Currently  Other Topics Concern   Not on file  Social History Narrative   Recently relocated to  [place] from [place].  She resides at home with her husband.   She has 3 children but 1 passed away from cancer.   Social Determinants of Health   Financial Resource Strain: Not on file  Food Insecurity: Not on file  Transportation Needs: Not on file  Physical Activity: Not on file  Stress: Not on file  Social Connections: Not on file  Intimate Partner Violence: Not on file   Family History  Problem Relation Age of Onset   Anxiety disorder Mother    Depression Mother    Heart disease Mother    Hypertension Mother    Arrhythmia Mother    Cancer Father        colon, age greater than 73   Lung cancer Father    Migraines Sister    Alcohol abuse Brother    Cancer Daughter        nerve sheath sarcoma    Objective: Office vital signs reviewed. BP 128/87   Pulse 85   Temp 97.8 F (36.6 C)   Ht 5\' 5"  (1.651 m)   Wt 232 lb (105.2 kg)   SpO2 97%   BMI 38.61 kg/m   Physical Examination:  General: Awake, alert, well nourished, No acute distress HEENT: Normal, sclera white Cardio: regular rate and rhythm, S1S2 heard, no murmurs appreciated Pulm: clear to auscultation bilaterally, no wheezes, rhonchi or rales; normal work of breathing on room air Skin: Persistent hyperpigmentation of the left lower extremity but overall psoriatic plaques look better  Assessment/ Plan: 60 y.o. female   Pure hypercholesterolemia  Essential hypertension  Mixed stress and urge urinary incontinence  She will trial 10 mg of Crestor every other day.  We will plan for fasting lipid panel in 3 months.  She will contact me if unable to tolerate Crestor and we will plan for Pravachol instead  Blood pressure well controlled.  No changes needed.  No refills needed  Continues to have some issues with urinary incontinence but is trying to work on this and is compliant with Detrol  No orders of the defined types were placed in this encounter.  No orders of the defined types were placed in this  encounter.    77, DO [facility] [phone]

## 2021-03-11 ENCOUNTER — Encounter: Payer: Self-pay | Admitting: Nurse Practitioner

## 2021-03-11 ENCOUNTER — Ambulatory Visit: Payer: No Typology Code available for payment source | Admitting: Nurse Practitioner

## 2021-03-11 ENCOUNTER — Encounter: Payer: Self-pay | Admitting: Family Medicine

## 2021-03-11 DIAGNOSIS — J039 Acute tonsillitis, unspecified: Secondary | ICD-10-CM

## 2021-03-11 MED ORDER — AZITHROMYCIN 250 MG PO TABS: ORAL_TABLET | ORAL | 0 refills | Status: DC

## 2021-03-11 NOTE — Progress Notes (Signed)
   Virtual Visit  Note Due to COVID-19 pandemic this visit was conducted virtually. This visit type was conducted due to national recommendations for restrictions regarding the COVID-19 Pandemic (e.g. social distancing, sheltering in place) in an effort to limit this patient's exposure and mitigate transmission in our community. All issues noted in this document were discussed and addressed.  A physical exam was not performed with this format.  I connected with Sydney [name] on 03/11/21 at 4:53 by telephone and verified that I am speaking with the correct person using two identifiers. Sydney [name] is currently located in [place] and no one is currently with her during visit. The provider, [name] Daphine Deutscher, FNP is located in their office at time of visit.  I discussed the limitations, risks, security and privacy concerns of performing an evaluation and management service by telephone and the availability of in person appointments. I also discussed with the patient that there may be a patient responsible charge related to this service. The patient expressed understanding and agreed to proceed.   History and Present Illness:   Chief Complaint: sore throoat  HPI Has sore thhroat with white patches on her tonsils. This is very common for her to get.     Review of Systems  Constitutional:  Negative for chills and fever.  HENT:  Positive for congestion and sore throat.   Respiratory: Negative.    Cardiovascular: Negative.   Genitourinary: Negative.   Musculoskeletal: Negative.   Neurological:  Negative for headaches.    Observations/Objective: Alert and oriented- answers all questions appropriately No distress Pateint states their are white patches on her tonsils  Assessment and Plan: [name] in today with chief complaint of No chief complaint on file.   1. Tonsillitis Force fluids Motrin or tylenol OTC OTC decongestant Throat lozenges if help New toothbrush in 3  days  Meds ordered this encounter  Medications   azithromycin (ZITHROMAX Z-PAK) 250 MG tablet    Sig: As directed    Dispense:  6 tablet    Refill:  0    Order Specific Question:   Supervising Provider    Answer:   Arville Care A [[id number]]       Follow Up Instructions: Prn     I discussed the assessment and treatment plan with the patient. The patient was provided an opportunity to ask questions and all were answered. The patient agreed with the plan and demonstrated an understanding of the instructions.   The patient was advised to call back or seek an in-person evaluation if the symptoms worsen or if the condition fails to improve as anticipated.  The above assessment and management plan was discussed with the patient. The patient verbalized understanding of and has agreed to the management plan. Patient is aware to call the clinic if symptoms persist or worsen. Patient is aware when to return to the clinic for a follow-up visit. Patient educated on when it is appropriate to go to the emergency department.   Time call ended:  5:05  I provided 12 minutes of  non face-to-face time during this encounter.    [name] Daphine Deutscher, FNP

## 2021-03-26 ENCOUNTER — Encounter: Payer: Self-pay | Admitting: Nurse Practitioner

## 2021-03-26 ENCOUNTER — Ambulatory Visit (INDEPENDENT_AMBULATORY_CARE_PROVIDER_SITE_OTHER): Payer: No Typology Code available for payment source | Admitting: Nurse Practitioner

## 2021-03-26 VITALS — BP 121/75 | HR 67 | Temp 97.6°F

## 2021-03-26 DIAGNOSIS — J069 Acute upper respiratory infection, unspecified: Secondary | ICD-10-CM | POA: Diagnosis not present

## 2021-03-26 MED ORDER — PREDNISONE 10 MG (21) PO TBPK: ORAL_TABLET | ORAL | 0 refills | Status: DC

## 2021-03-26 MED ORDER — AZITHROMYCIN 250 MG PO TABS: ORAL_TABLET | ORAL | 0 refills | Status: AC

## 2021-03-26 MED ORDER — BENZONATATE 100 MG PO CAPS: 100.0000 mg | ORAL_CAPSULE | Freq: Three times a day (TID) | ORAL | 0 refills | Status: DC | PRN

## 2021-03-26 MED ORDER — DM-GUAIFENESIN ER 30-600 MG PO TB12: 1.0000 | ORAL_TABLET | Freq: Two times a day (BID) | ORAL | 0 refills | Status: DC

## 2021-03-26 NOTE — Assessment & Plan Note (Signed)
Ongoing symptoms of upper respiratory infection with cough, postnasal drip and congestion.  Patient reports in the last several days symptoms have become progressively worse.  On assessment bilateral lower and upper lungs are clear, prednisone taper, dexamethasone for cough and congestion, benzonatate, and azithromycin [Z-Pak] Rx sent to pharmacy.  Education provided to patient with printed handouts given.  Completed strep swab with results pending.

## 2021-03-26 NOTE — Progress Notes (Signed)
Acute Office Visit  Subjective:    Patient ID: Sydney [name], female    DOB: June 24, 1961, 60 y.o.   MRN: [id number]  Chief Complaint  Patient presents with   URI    URI  This is a recurrent problem. The current episode started in the past 7 days. The problem has been unchanged. There has been no fever. Associated symptoms include congestion and coughing. Pertinent negatives include no abdominal pain, headaches, nausea, rash, sore throat or swollen glands. Associated symptoms comments: Post nasal drip . She has tried nothing for the symptoms.    Past Medical History:  Diagnosis Date   Allergy    Anemia    history of   Anxiety    Arthritis    Asthma    Atherosclerosis    Depression    Family history of adverse reaction to anesthesia    pt's mother felt everything and couldn't speak during a gallbladder surgery   GERD (gastroesophageal reflux disease)    History of hiatal hernia    History of kidney stones    Hypertension    Pneumonia    a few times   TIA (transient ischemic attack) 2012    Past Surgical History:  Procedure Laterality Date   ABDOMINAL HYSTERECTOMY     abdominal   APPENDECTOMY     bladder tack     CARPAL TUNNEL RELEASE Bilateral    CERVICAL SPINE SURGERY     5-6, 6-7   CHOLECYSTECTOMY     COLONOSCOPY WITH PROPOFOL N/A 07/10/2020   Procedure: COLONOSCOPY WITH PROPOFOL;  Surgeon: Eloise Harman, DO;  [facility]: [facility];  Service: Endoscopy;  Laterality: N/A;  12:15pm   HAMMER TOE SURGERY Right    JOINT REPLACEMENT     TOTAL HIP 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   Procedure: LEFT TOTAL HIP ARTHROPLASTY -DIRECT ANTERIOR;  Surgeon: Marybelle Killings, MD;  Location: [facility];  Service: Orthopedics;  Laterality: Left;   TOTAL HIP ARTHROPLASTY Right 12/05/2019   Procedure: RIGHT TOTAL HIP ARTHROPLASTY ANTERIOR APPROACH  DIRECT ANTERIOR;  Surgeon: Marybelle Killings, MD;  Location: [place];  Service: Orthopedics;  Laterality: Right;    Family History  Problem  Relation Age of Onset   Anxiety disorder Mother    Depression Mother    Heart disease Mother    Hypertension Mother    Arrhythmia Mother    Cancer Father        colon, age greater than 37   Lung cancer Father    Migraines Sister    Alcohol abuse Brother    Cancer Daughter        nerve sheath sarcoma    Social History   Socioeconomic History   Marital status: Married    Spouse name: Not on file   Number of children: 3   Years of education: Not on file   Highest education level: Not on file  Occupational History   Not on file  Tobacco Use   Smoking status: Every Day    Packs/day: 0.50    Years: 23.00    Pack years: 11.50    Types: Cigarettes   Smokeless tobacco: Never  Vaping Use   Vaping Use: Never used  Substance and Sexual Activity   Alcohol use: Yes    Comment: occ   Drug use: Never   Sexual activity: Not Currently  Other Topics Concern   Not on file  Social History Narrative   Recently relocated to [place] from [place].  She  resides at home with her husband.   She has 3 children but 1 passed away from cancer.   Social Determinants of Health   Financial Resource Strain: Not on file  Food Insecurity: Not on file  Transportation Needs: Not on file  Physical Activity: Not on file  Stress: Not on file  Social Connections: Not on file  Intimate Partner Violence: Not on file    Outpatient Medications Prior to Visit  Medication Sig Dispense Refill   albuterol (VENTOLIN HFA) 108 (90 Base) MCG/ACT inhaler Inhale 2 puffs into the lungs every 6 (six) hours as needed for wheezing or shortness of breath.     amLODipine (NORVASC) 5 MG tablet Take 1 tablet (5 mg total) by mouth daily. 90 tablet 2   augmented betamethasone dipropionate (DIPROLENE-AF) 0.05 % cream Apply topically.     cetirizine (ZYRTEC) 10 MG tablet Take 10 mg by mouth daily.     HYDROcodone-acetaminophen (NORCO/VICODIN) 5-325 MG tablet Take 1 tablet by mouth every 4 (four) hours as needed for  severe pain. 30 tablet 0   metoprolol succinate (TOPROL-XL) 25 MG 24 hr tablet Take 1 tablet (25 mg total) by mouth daily. 90 tablet 2   rosuvastatin (CRESTOR) 10 MG tablet Take 10 mg by mouth every other day.     sertraline (ZOLOFT) 100 MG tablet Take 1.5 tablets (150 mg total) by mouth daily. 90 tablet 3   tolterodine (DETROL LA) 4 MG 24 hr capsule Take 1 capsule (4 mg total) by mouth daily. 30 capsule 2   traZODone (DESYREL) 100 MG tablet Take 1-2 tablets (100-200 mg total) by mouth at bedtime as needed for sleep. Place on file 180 tablet 1   azithromycin (ZITHROMAX Z-PAK) 250 MG tablet As directed 6 tablet 0   hydrOXYzine (ATARAX/VISTARIL) 10 MG tablet Take 1 tablet (10 mg total) by mouth 3 (three) times daily as needed. 30 tablet 0   No facility-administered medications prior to visit.    Allergies  Allergen Reactions   Bee Pollen Anaphylaxis and Swelling   Bee Venom Anaphylaxis and Swelling   Butorphanol Other (See Comments)    Not sure told by md she was allergic after surgery.    Meloxicam Anxiety    Mood disorder Altered her personality    Penicillins Anaphylaxis and Hives    Unable to Recall As child mom was told she is highly allergic Did it involve swelling of the face/tongue/throat, SOB, or low BP? Unknown Did it involve sudden or severe rash/hives, skin peeling, or any reaction on the inside of your mouth or nose? Unknown Did you need to seek medical attention at a hospital or doctor's office? Unknown When did it last happen?      childhood allergy If all above answers are "NO", may proceed with cephalosporin use.    Prochlorperazine Edisylate Anaphylaxis   Sulfa Antibiotics Anaphylaxis    Unable to Recall   Tramadol Anxiety    Didn't like the way it made her feel    Topiramate Nausea And Vomiting   Doxycycline Dermatitis, Hives, Itching, Photosensitivity and Rash    Review of Systems  Constitutional: Negative.  Negative for fever.  HENT:  Positive for  congestion. Negative for sore throat.   Respiratory:  Positive for cough.   Gastrointestinal:  Negative for abdominal pain and nausea.  Skin:  Negative for rash.  Neurological:  Negative for headaches.  All other systems reviewed and are negative.     Objective:    Physical Exam Vitals  and nursing note reviewed.  Constitutional:      Appearance: Normal appearance.  HENT:     Head: Normocephalic.     Nose: Congestion present.  Eyes:     Conjunctiva/sclera: Conjunctivae normal.  Cardiovascular:     Rate and Rhythm: Normal rate and regular rhythm.     Pulses: Normal pulses.     Heart sounds: Normal heart sounds.  Pulmonary:     Effort: Pulmonary effort is normal.     Breath sounds: Normal breath sounds.  Abdominal:     General: Bowel sounds are normal.  Skin:    Findings: No rash.  Neurological:     Mental Status: She is alert and oriented to person, place, and time.  Psychiatric:        Behavior: Behavior normal.    BP 121/75   Pulse 67   Temp 97.6 F (36.4 C) (Temporal)   SpO2 94%  Wt Readings from Last 3 Encounters:  03/06/21 232 lb (105.2 kg)  02/26/21 234 lb (106.1 kg)  02/22/21 237 lb 3.2 oz (107.6 kg)    Health Maintenance Due  Topic Date Due   MAMMOGRAM  Never done   COVID-19 Vaccine (3 - Pfizer risk series) 12/03/2019   INFLUENZA VACCINE  02/18/2021    There are no preventive care reminders to display for this patient.   Lab Results  Component Value Date   TSH 1.110 05/29/2020   Lab Results  Component Value Date   WBC 5.2 07/06/2020   HGB 14.2 07/06/2020   HCT 45.0 07/06/2020   MCV 95.3 07/06/2020   PLT 254 07/06/2020   Lab Results  Component Value Date   NA 140 12/11/2020   K 4.5 12/11/2020   CO2 23 12/11/2020   GLUCOSE 91 12/11/2020   BUN 13 12/11/2020   CREATININE 0.62 12/11/2020   BILITOT 0.6 12/11/2020   ALKPHOS 103 12/11/2020   AST 11 12/11/2020   ALT 10 12/11/2020   PROT 6.3 12/11/2020   ALBUMIN 3.9 12/11/2020   CALCIUM  9.0 12/11/2020   ANIONGAP 6 07/06/2020   EGFR 101 12/11/2020   Lab Results  Component Value Date   CHOL 187 12/11/2020   Lab Results  Component Value Date   HDL 47 12/11/2020   Lab Results  Component Value Date   LDLCALC 127 (H) 12/11/2020   Lab Results  Component Value Date   TRIG 71 12/11/2020   Lab Results  Component Value Date   CHOLHDL 4.0 12/11/2020   Lab Results  Component Value Date   HGBA1C 5.5 10/01/2018       Assessment & Plan:   Problem List Items Addressed This Visit       Respiratory   Upper respiratory tract infection - Primary    Ongoing symptoms of upper respiratory infection with cough, postnasal drip and congestion.  Patient reports in the last several days symptoms have become progressively worse.  On assessment bilateral lower and upper lungs are clear, prednisone taper, dexamethasone for cough and congestion, benzonatate, and azithromycin [Z-Pak] Rx sent to pharmacy.  Education provided to patient with printed handouts given.  Completed strep swab with results pending.      Relevant Medications   benzonatate (TESSALON PERLES) 100 MG capsule   dextromethorphan-guaiFENesin (MUCINEX DM) 30-600 MG 12hr tablet   azithromycin (ZITHROMAX) 250 MG tablet   predniSONE (STERAPRED UNI-PAK 21 TAB) 10 MG (21) TBPK tablet   Other Relevant Orders   Rapid Strep Screen ([facility] ONLY)  Meds ordered this encounter  Medications   benzonatate (TESSALON PERLES) 100 MG capsule    Sig: Take 1 capsule (100 mg total) by mouth 3 (three) times daily as needed for cough.    Dispense:  20 capsule    Refill:  0    Order Specific Question:   Supervising Provider    Answer:   Janora Norlander [[id number]]   dextromethorphan-guaiFENesin (MUCINEX DM) 30-600 MG 12hr tablet    Sig: Take 1 tablet by mouth 2 (two) times daily.    Dispense:  30 tablet    Refill:  0    Order Specific Question:   Supervising Provider    Answer:   Janora Norlander [[id number]]    azithromycin (ZITHROMAX) 250 MG tablet    Sig: Take 2 tablets on day 1, then 1 tablet daily on days 2 through 5    Dispense:  6 tablet    Refill:  0    Order Specific Question:   Supervising Provider    Answer:   Janora Norlander [[id number]]   predniSONE (STERAPRED UNI-PAK 21 TAB) 10 MG (21) TBPK tablet    Sig: 6 tablet day 1, 5 tablet due to, 4 tablet day 3, 3 tablet day 4, 2 tablet day 5, 1 tablet day 6    Dispense:  1 each    Refill:  0    Order Specific Question:   Supervising Provider    Answer:   Janora Norlander [[id number]]     Ivy Lynn, NP

## 2021-03-27 LAB — CULTURE, GROUP A STREP

## 2021-03-27 LAB — RAPID STREP SCREEN (MED CTR MEBANE ONLY): Strep Gp A Ag, IA W/Reflex: NEGATIVE

## 2021-04-01 ENCOUNTER — Ambulatory Visit
Admission: RE | Admit: 2021-04-01 | Discharge: 2021-04-01 | Disposition: A | Discharge status: Home / Self Care | Payer: No Typology Code available for payment source | Source: Ambulatory Visit | Attending: Family Medicine | Admitting: Family Medicine

## 2021-04-01 ENCOUNTER — Other Ambulatory Visit: Payer: Self-pay

## 2021-04-01 DIAGNOSIS — Z1231 Encounter for screening mammogram for malignant neoplasm of breast: Secondary | ICD-10-CM

## 2021-04-04 ENCOUNTER — Encounter: Payer: Self-pay | Admitting: Family Medicine

## 2021-04-05 ENCOUNTER — Other Ambulatory Visit: Payer: Self-pay | Admitting: Nurse Practitioner

## 2021-04-05 DIAGNOSIS — J028 Acute pharyngitis due to other specified organisms: Secondary | ICD-10-CM

## 2021-04-05 MED ORDER — CLINDAMYCIN HCL 300 MG PO CAPS: 300.0000 mg | ORAL_CAPSULE | Freq: Three times a day (TID) | ORAL | 0 refills | Status: DC

## 2021-04-05 NOTE — Telephone Encounter (Signed)
Can Dr Jeannett Senior covering provider advise on this issue and call patient back.

## 2021-05-08 ENCOUNTER — Other Ambulatory Visit: Payer: Self-pay

## 2021-05-08 ENCOUNTER — Encounter: Payer: Self-pay | Admitting: Internal Medicine

## 2021-05-08 ENCOUNTER — Other Ambulatory Visit: Payer: Self-pay | Admitting: Family Medicine

## 2021-05-08 ENCOUNTER — Ambulatory Visit: Payer: No Typology Code available for payment source | Admitting: Family Medicine

## 2021-05-08 ENCOUNTER — Encounter: Payer: Self-pay | Admitting: Family Medicine

## 2021-05-08 VITALS — BP 105/68 | HR 67 | Temp 98.1°F | Ht 65.0 in | Wt 232.8 lb

## 2021-05-08 DIAGNOSIS — K219 Gastro-esophageal reflux disease without esophagitis: Secondary | ICD-10-CM

## 2021-05-08 DIAGNOSIS — Z9889 Other specified postprocedural states: Secondary | ICD-10-CM

## 2021-05-08 DIAGNOSIS — R0789 Other chest pain: Secondary | ICD-10-CM

## 2021-05-08 DIAGNOSIS — R10819 Abdominal tenderness, unspecified site: Secondary | ICD-10-CM | POA: Diagnosis not present

## 2021-05-08 DIAGNOSIS — N3000 Acute cystitis without hematuria: Secondary | ICD-10-CM

## 2021-05-08 DIAGNOSIS — Z23 Encounter for immunization: Secondary | ICD-10-CM | POA: Diagnosis not present

## 2021-05-08 DIAGNOSIS — R1013 Epigastric pain: Secondary | ICD-10-CM

## 2021-05-08 DIAGNOSIS — N3946 Mixed incontinence: Secondary | ICD-10-CM

## 2021-05-08 DIAGNOSIS — E7800 Pure hypercholesterolemia, unspecified: Secondary | ICD-10-CM

## 2021-05-08 LAB — URINALYSIS, ROUTINE W REFLEX MICROSCOPIC
Bilirubin, UA: NEGATIVE
Glucose, UA: NEGATIVE
Ketones, UA: NEGATIVE
Leukocytes,UA: NEGATIVE
Nitrite, UA: NEGATIVE
Protein,UA: NEGATIVE
Specific Gravity, UA: 1.015 (ref 1.005–1.030)
Urobilinogen, Ur: 0.2 mg/dL (ref 0.2–1.0)
pH, UA: 7 (ref 5.0–7.5)

## 2021-05-08 LAB — MICROSCOPIC EXAMINATION
RBC, Urine: NONE SEEN /hpf (ref 0–2)
Renal Epithel, UA: NONE SEEN /hpf

## 2021-05-08 MED ORDER — NITROFURANTOIN MONOHYD MACRO 100 MG PO CAPS: 100.0000 mg | ORAL_CAPSULE | Freq: Two times a day (BID) | ORAL | 0 refills | Status: AC

## 2021-05-08 MED ORDER — TOLTERODINE TARTRATE ER 4 MG PO CP24: 4.0000 mg | ORAL_CAPSULE | Freq: Every day | ORAL | 2 refills | Status: DC

## 2021-05-08 MED ORDER — ESOMEPRAZOLE MAGNESIUM 40 MG PO CPDR: 40.0000 mg | DELAYED_RELEASE_CAPSULE | Freq: Every day | ORAL | 3 refills | Status: DC

## 2021-05-08 NOTE — Progress Notes (Signed)
Subjective: CC: Chronic follow-up PCP: Janora Norlander, DO Sydney [name] is a 60 y.o. female presenting to clinic today for:  1.  Hyperlipidemia Patient is fasting today would like to have her lipid panel rechecked.  Not currently taking Crestor.  She reports some atypical chest pain that she feels to be related to stress.  No associated nausea, vomiting, diaphoresis or shortness of breath  2.  Overactive bladder Patient reports urinary incontinence despite use of Detrol.  She has history of bladder surgery many years ago.  She is currently seeing somebody in [place] would like to move her care to [place] if possible.  She reports no hematuria, flank pain or fevers  3.  Hiatal hernia Patient reports history of hiatal hernia.  She has been eating Tums every night because she has reflux that is exacerbated by lying down.  She feels like she has been having some early satiety due to bloating as well.  She was seen for colonoscopy back in 2021 but has not yet reached out to her gastroenterologist with regards to the symptoms.  No reports of GI bleeding or vomiting.   ROS: Per HPI  Allergies  Allergen Reactions   Bee Pollen Anaphylaxis and Swelling   Bee Venom Anaphylaxis and Swelling   Butorphanol Other (See Comments)    Not sure told by md she was allergic after surgery.    Meloxicam Anxiety    Mood disorder Altered her personality    Penicillins Anaphylaxis and Hives    Unable to Recall As child mom was told she is highly allergic Did it involve swelling of the face/tongue/throat, SOB, or low BP? Unknown Did it involve sudden or severe rash/hives, skin peeling, or any reaction on the inside of your mouth or nose? Unknown Did you need to seek medical attention at a hospital or doctor's office? Unknown When did it last happen?      childhood allergy If all above answers are "NO", may proceed with cephalosporin use.    Prochlorperazine Edisylate Anaphylaxis    Sulfa Antibiotics Anaphylaxis    Unable to Recall   Tramadol Anxiety    Didn't like the way it made her feel    Topiramate Nausea And Vomiting   Doxycycline Dermatitis, Hives, Itching, Photosensitivity and Rash   Past Medical History:  Diagnosis Date   Allergy    Anemia    history of   Anxiety    Arthritis    Asthma    Atherosclerosis    Depression    Family history of adverse reaction to anesthesia    pt's mother felt everything and couldn't speak during a gallbladder surgery   GERD (gastroesophageal reflux disease)    History of hiatal hernia    History of kidney stones    Hypertension    Pneumonia    a few times   TIA (transient ischemic attack) 2012    Current Outpatient Medications:    albuterol (VENTOLIN HFA) 108 (90 Base) MCG/ACT inhaler, Inhale 2 puffs into the lungs every 6 (six) hours as needed for wheezing or shortness of breath., Disp: , Rfl:    amLODipine (NORVASC) 5 MG tablet, Take 1 tablet (5 mg total) by mouth daily., Disp: 90 tablet, Rfl: 2   augmented betamethasone dipropionate (DIPROLENE-AF) 0.05 % cream, Apply topically., Disp: , Rfl:    benzonatate (TESSALON PERLES) 100 MG capsule, Take 1 capsule (100 mg total) by mouth 3 (three) times daily as needed for cough., Disp: 20 capsule, Rfl: 0  cetirizine (ZYRTEC) 10 MG tablet, Take 10 mg by mouth daily., Disp: , Rfl:    clindamycin (CLEOCIN) 300 MG capsule, Take 1 capsule (300 mg total) by mouth 3 (three) times daily., Disp: 30 capsule, Rfl: 0   dextromethorphan-guaiFENesin (MUCINEX DM) 30-600 MG 12hr tablet, Take 1 tablet by mouth 2 (two) times daily., Disp: 30 tablet, Rfl: 0   HYDROcodone-acetaminophen (NORCO/VICODIN) 5-325 MG tablet, Take 1 tablet by mouth every 4 (four) hours as needed for severe pain., Disp: 30 tablet, Rfl: 0   metoprolol succinate (TOPROL-XL) 25 MG 24 hr tablet, Take 1 tablet (25 mg total) by mouth daily., Disp: 90 tablet, Rfl: 2   predniSONE (STERAPRED UNI-PAK 21 TAB) 10 MG (21) TBPK  tablet, 6 tablet day 1, 5 tablet due to, 4 tablet day 3, 3 tablet day 4, 2 tablet day 5, 1 tablet day 6, Disp: 1 each, Rfl: 0   rosuvastatin (CRESTOR) 10 MG tablet, Take 10 mg by mouth every other day., Disp: , Rfl:    sertraline (ZOLOFT) 100 MG tablet, Take 1.5 tablets (150 mg total) by mouth daily., Disp: 90 tablet, Rfl: 3   tolterodine (DETROL LA) 4 MG 24 hr capsule, Take 1 capsule (4 mg total) by mouth daily., Disp: 30 capsule, Rfl: 2   traZODone (DESYREL) 100 MG tablet, Take 1-2 tablets (100-200 mg total) by mouth at bedtime as needed for sleep. Place on file, Disp: 180 tablet, Rfl: 1 Social History   Socioeconomic History   Marital status: Married    Spouse name: Not on file   Number of children: 3   Years of education: Not on file   Highest education level: Not on file  Occupational History   Not on file  Tobacco Use   Smoking status: Every Day    Packs/day: 0.50    Years: 23.00    Pack years: 11.50    Types: Cigarettes   Smokeless tobacco: Never  Vaping Use   Vaping Use: Never used  Substance and Sexual Activity   Alcohol use: Yes    Comment: occ   Drug use: Never   Sexual activity: Not Currently  Other Topics Concern   Not on file  Social History Narrative   Recently relocated to [place] from [place].  She resides at home with her husband.   She has 3 children but 1 passed away from cancer.   Social Determinants of Health   Financial Resource Strain: Not on file  Food Insecurity: Not on file  Transportation Needs: Not on file  Physical Activity: Not on file  Stress: Not on file  Social Connections: Not on file  Intimate Partner Violence: Not on file   Family History  Problem Relation Age of Onset   Anxiety disorder Mother    Depression Mother    Heart disease Mother    Hypertension Mother    Arrhythmia Mother    Cancer Father        colon, age greater than 37   Lung cancer Father    Migraines Sister    Cancer Daughter        nerve sheath sarcoma    Alcohol abuse Brother    Breast cancer Neg Hx     Objective: Office vital signs reviewed. BP 105/68   Pulse 67   Temp 98.1 F (36.7 C)   Ht _0  (1.651 m)   Wt 232 lb 12.8 oz (105.6 kg)   SpO2 94%   BMI 38.74 kg/m   Physical Examination:  General: Awake, alert, well nourished, No acute distress HEENT: Normal; sclera white Cardio: regular rate and rhythm, S1S2 heard, no murmurs appreciated Pulm: clear to auscultation bilaterally, no wheezes, rhonchi or rales; normal work of breathing on room air GI: soft, + epigastric tenderness to palpation, + LLQ TTP, non-distended, bowel sounds present x4, no hepatomegaly, no splenomegaly, no masses  GU: +suprapubic TTP  Assessment/ Plan: 60 y.o. female   Atypical chest pain - Plan: EKG 12-Lead  Suprapubic tenderness - Plan: Urinalysis, Routine w reflex microscopic  Gastroesophageal reflux disease without esophagitis - Plan: esomeprazole (NEXIUM) 40 MG capsule, Ambulatory referral to Gastroenterology  Epigastric pain - Plan: esomeprazole (NEXIUM) 40 MG capsule, Ambulatory referral to Gastroenterology, CMP14+EGFR, CBC, Lipase  Mixed stress and urge urinary incontinence - Plan: tolterodine (DETROL LA) 4 MG 24 hr capsule, Ambulatory referral to Urology  History of bladder surgery - Plan: Ambulatory referral to Urology  Pure hypercholesterolemia - Plan: Lipid Panel, CMP14+EGFR  Need for immunization against influenza - Plan: Flu Vaccine QUAD 41moIM (Fluarix, Fluzone & Alfiuria Quad PF)  Atypical chest pain may be explained by hiatal hernia and uncontrolled GERD.  Her EKG did not show any evidence of ischemia or abnormality today.  Suprapubic tenderness on exam.  UA obtained which did show a urinary tract infection.  Macrobid sent to pharmacy for treatment  Trial of PPI for GERD and referral back to gastroenterology for consideration EGD given epigastric pain, early satiety and possible history of hiatal hernia.  Referral back to  urology as well given what sounds like recurrent urinary incontinence after bladder surgery.  She sustained a pelvic fracture last year not sure if this would impact but certainly think this warrants referral.  She will continue Detrol in the meantime  Recheck fasting lipid, CMP  Influenza vaccination administered  Orders Placed This Encounter  Procedures   Flu Vaccine QUAD 657moM (Fluarix, Fluzone & Alfiuria Quad PF)   Urinalysis, Routine w reflex microscopic   Lipid Panel   CMP14+EGFR   CBC   Lipase   Ambulatory referral to Gastroenterology    Referral Priority:   Routine    Referral Type:   Consultation    Referral Reason:   Specialty Services Required    Number of Visits Requested:   1   Ambulatory referral to Urology    Referral Priority:   Routine    Referral Type:   Consultation    Referral Reason:   Specialty Services Required    Requested Specialty:   Urology    Number of Visits Requested:   1   EKG 12-Lead   No orders of the defined types were placed in this encounter.    AsJanora NorlanderDO [phone]

## 2021-05-09 LAB — CBC
Hematocrit: 46.1 % (ref 34.0–46.6)
Hemoglobin: 15 g/dL (ref 11.1–15.9)
MCH: 28.9 pg (ref 26.6–33.0)
MCHC: 32.5 g/dL (ref 31.5–35.7)
MCV: 89 fL (ref 79–97)
Platelets: 265 10*3/uL (ref 150–450)
RBC: 5.19 x10E6/uL (ref 3.77–5.28)
RDW: 12.9 % (ref 11.7–15.4)
WBC: 5.1 10*3/uL (ref 3.4–10.8)

## 2021-05-09 LAB — CMP14+EGFR
ALT: 11 IU/L (ref 0–32)
AST: 12 IU/L (ref 0–40)
Albumin/Globulin Ratio: 1.9 (ref 1.2–2.2)
Albumin: 4.2 g/dL (ref 3.8–4.8)
Alkaline Phosphatase: 92 IU/L (ref 44–121)
BUN/Creatinine Ratio: 15 (ref 12–28)
BUN: 10 mg/dL (ref 8–27)
Bilirubin Total: 0.6 mg/dL (ref 0.0–1.2)
CO2: 24 mmol/L (ref 20–29)
Calcium: 9.1 mg/dL (ref 8.7–10.3)
Chloride: 101 mmol/L (ref 96–106)
Creatinine, Ser: 0.65 mg/dL (ref 0.57–1.00)
Globulin, Total: 2.2 g/dL (ref 1.5–4.5)
Glucose: 94 mg/dL (ref 70–99)
Potassium: 4.9 mmol/L (ref 3.5–5.2)
Sodium: 138 mmol/L (ref 134–144)
Total Protein: 6.4 g/dL (ref 6.0–8.5)
eGFR: 100 mL/min/{1.73_m2} (ref >59)

## 2021-05-09 LAB — LIPID PANEL
Chol/HDL Ratio: 4.1 ratio (ref 0.0–4.4)
Cholesterol, Total: 209 mg/dL — ABNORMAL HIGH (ref 100–199)
HDL: 51 mg/dL (ref >39)
LDL Chol Calc (NIH): 142 mg/dL — ABNORMAL HIGH (ref 0–99)
Triglycerides: 89 mg/dL (ref 0–149)
VLDL Cholesterol Cal: 16 mg/dL (ref 5–40)

## 2021-05-09 LAB — LIPASE: Lipase: 22 U/L (ref 14–72)

## 2021-05-10 ENCOUNTER — Ambulatory Visit: Payer: No Typology Code available for payment source | Admitting: Urology

## 2021-05-10 ENCOUNTER — Encounter: Payer: Self-pay | Admitting: Urology

## 2021-05-10 ENCOUNTER — Other Ambulatory Visit: Payer: Self-pay

## 2021-05-10 VITALS — BP 125/82 | HR 74 | Temp 98.8°F | Wt 234.0 lb

## 2021-05-10 DIAGNOSIS — N3941 Urge incontinence: Secondary | ICD-10-CM

## 2021-05-10 DIAGNOSIS — R350 Frequency of micturition: Secondary | ICD-10-CM

## 2021-05-10 LAB — URINALYSIS, ROUTINE W REFLEX MICROSCOPIC
Bilirubin, UA: NEGATIVE
Glucose, UA: NEGATIVE
Ketones, UA: NEGATIVE
Leukocytes,UA: NEGATIVE
Nitrite, UA: NEGATIVE
Protein,UA: NEGATIVE
RBC, UA: NEGATIVE
Specific Gravity, UA: >1.03 — ABNORMAL HIGH (ref 1.005–1.030)
Urobilinogen, Ur: 0.2 mg/dL (ref 0.2–1.0)
pH, UA: 5.5 (ref 5.0–7.5)

## 2021-05-10 LAB — BLADDER SCAN AMB NON-IMAGING: Scan Result: 65

## 2021-05-10 MED ORDER — GEMTESA 75 MG PO TABS: 75.0000 mg | ORAL_TABLET | Freq: Every day | ORAL | 0 refills | Status: DC

## 2021-05-10 NOTE — Progress Notes (Signed)
Urological Symptom Review  Patient is experiencing the following symptoms: Frequent urination Hard to postpone urination Get up at night to urinate Leakage of urine Weak stream   Review of Systems  Gastrointestinal (upper)  : Indigestion/heartburn  Gastrointestinal (lower) : Negative for lower GI symptoms  Constitutional : Negative for symptoms  Skin: Skin rash/lesion Itching  Eyes: Negative for eye symptoms  Ear/Nose/Throat : Sinus problems  Hematologic/Lymphatic: Easy bruising  Cardiovascular : Negative for cardiovascular symptoms  Respiratory : Negative for respiratory symptoms  Endocrine: Negative for endocrine symptoms  Musculoskeletal: Back pain  Neurological: Negative for neurological symptoms  Psychologic: Depression Anxiety

## 2021-05-10 NOTE — Progress Notes (Signed)
Assessment: 1. Urge incontinence   2. Urine frequency     Plan: Diagnosis and management of urge incontinence discussed with the patient today.  Options for management including avoidance of dietary irritants, behavioral modification, medical therapy, neuromodulation, and chemodenervation discussed. Trial of Gemtesa 75 mg daily.  Samples given. D/C tolterodine Return to office in 1 month I also discussed the potential need for further evaluation with cystoscopy and urodynamics given her prior history of bladder surgery.  Chief Complaint:  Chief Complaint  Patient presents with   Urinary Incontinence     History of Present Illness:  Sydney [name] is a 60 y.o. year old female who is seen in consultation from [place] M, DO  for evaluation of urinary incontinence.  Her symptoms have been present for several years but have recently worsened.  She reports worsening of her incontinence after a recent back injury.  She currently has frequency, urgency, and nocturia x2.  She has incontinence with urgency as well as without sensory awareness.  She is using too many pads per day.  She has also been on tolterodine for approximately 2 months without improvement in her symptoms.  She is not having any dysuria or gross hematuria.  She is currently on nitrofurantoin for a UTI.  No history of frequent UTIs.  She is status post a bladder sling in [place] a number of years ago.  She has not noticed any vaginal bulge.  She does have a history of kidney stones passing multiple stones.  Her last episode was approximately 1 year ago.  She does not have any problems with fecal incontinence.  No constipation.   Past Medical History:  Past Medical History:  Diagnosis Date   Allergy    Anemia    history of   Anxiety    Arthritis    Asthma    Atherosclerosis    Depression    Family history of adverse reaction to anesthesia    pt's mother felt everything and couldn't speak during a  gallbladder surgery   GERD (gastroesophageal reflux disease)    History of hiatal hernia    History of kidney stones    Hypertension    Pneumonia    a few times   TIA (transient ischemic attack) 2012    Past Surgical History:  Past Surgical History:  Procedure Laterality Date   ABDOMINAL HYSTERECTOMY     abdominal   APPENDECTOMY     bladder tack     CARPAL TUNNEL RELEASE Bilateral    CERVICAL SPINE SURGERY     5-6, 6-7   CHOLECYSTECTOMY     COLONOSCOPY WITH PROPOFOL N/A 07/10/2020   Procedure: COLONOSCOPY WITH PROPOFOL;  Surgeon: Lanelle Bal, DO;  Location: [facility];  Service: Endoscopy;  Laterality: N/A;  12:15pm   HAMMER TOE SURGERY Right    JOINT REPLACEMENT     TOTAL HIP 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   Procedure: LEFT TOTAL HIP ARTHROPLASTY -DIRECT ANTERIOR;  Surgeon: Eldred Manges, MD;  Location: [facility] OR;  Service: Orthopedics;  Laterality: Left;   TOTAL HIP ARTHROPLASTY Right 12/05/2019   Procedure: RIGHT TOTAL HIP ARTHROPLASTY ANTERIOR APPROACH  DIRECT ANTERIOR;  Surgeon: Eldred Manges, MD;  Location: [facility] OR;  Service: Orthopedics;  Laterality: Right;    Allergies:  Allergies  Allergen Reactions   Bee Pollen Anaphylaxis and Swelling   Bee Venom Anaphylaxis and Swelling   Butorphanol Other (See Comments)    Not sure told by md she was allergic after  surgery.    Meloxicam Anxiety    Mood disorder Altered her personality    Penicillins Anaphylaxis and Hives    Unable to Recall As child mom was told she is highly allergic Did it involve swelling of the face/tongue/throat, SOB, or low BP? Unknown Did it involve sudden or severe rash/hives, skin peeling, or any reaction on the inside of your mouth or nose? Unknown Did you need to seek medical attention at a hospital or doctor's office? Unknown When did it last happen?      childhood allergy If all above answers are "NO", may proceed with cephalosporin use.    Prochlorperazine Edisylate Anaphylaxis   Sulfa  Antibiotics Anaphylaxis    Unable to Recall   Tramadol Anxiety    Didn't like the way it made her feel    Topiramate Nausea And Vomiting   Doxycycline Dermatitis, Hives, Itching, Photosensitivity and Rash    Family History:  Family History  Problem Relation Age of Onset   Anxiety disorder Mother    Depression Mother    Heart disease Mother    Hypertension Mother    Arrhythmia Mother    Cancer Father        colon, age greater than 33   Lung cancer Father    Migraines Sister    Cancer Daughter        nerve sheath sarcoma   Alcohol abuse Brother    Breast cancer Neg Hx     Social History:  Social History   Tobacco Use   Smoking status: Every Day    Packs/day: 0.50    Years: 23.00    Pack years: 11.50    Types: Cigarettes   Smokeless tobacco: Never  Vaping Use   Vaping Use: Never used  Substance Use Topics   Alcohol use: Yes    Comment: occ   Drug use: Never    Review of symptoms:  Constitutional:  Negative for unexplained weight loss, night sweats, fever, chills ENT:  Negative for nose bleeds, sinus pain, painful swallowing CV:  Negative for chest pain, shortness of breath, exercise intolerance, palpitations, loss of consciousness Resp:  Negative for cough, wheezing, shortness of breath GI:  Negative for nausea, vomiting, diarrhea, bloody stools GU:  Positives noted in HPI; otherwise negative for gross hematuria, dysuria Neuro:  Negative for seizures, poor balance, limb weakness, slurred speech Psych:  Negative for lack of energy, depression, anxiety Endocrine:  Negative for polydipsia, polyuria, symptoms of hypoglycemia (dizziness, hunger, sweating) Hematologic:  Negative for anemia, purpura, petechia, prolonged or excessive bleeding, use of anticoagulants  Allergic:  Negative for difficulty breathing or choking as a result of exposure to anything; no shellfish allergy; no allergic response (rash/itch) to materials, foods  Physical exam: BP 125/82   Pulse 74    Temp 98.8 F (37.1 C)   Wt 234 lb (106.1 kg)   BMI 38.94 kg/m  GENERAL APPEARANCE:  Well appearing, well developed, well nourished, NAD HEENT: Atraumatic, Normocephalic, oropharynx clear. NECK: Supple without lymphadenopathy or thyromegaly. LUNGS: Clear to auscultation bilaterally. HEART: Regular Rate and Rhythm without murmurs, gallops, or rubs. ABDOMEN: Soft, non-tender, No Masses. EXTREMITIES: Moves all extremities well.  Without clubbing, cyanosis, or edema. NEUROLOGIC:  Alert and oriented x 3, normal gait, CN II-XII grossly intact.  MENTAL STATUS:  Appropriate. BACK:  Non-tender to palpation.  No CVAT SKIN:  Warm, dry and intact.    Results: Results for orders placed or performed in visit on 05/10/21 (from the past 24 hour(s))  Urinalysis, Routine w reflex microscopic   Collection Time: 05/10/21  1:13 PM  Result Value Ref Range   Specific Gravity, UA >1.030 (H) 1.005 - 1.030   pH, UA 5.5 5.0 - 7.5   Color, UA Yellow Yellow   Appearance Ur Clear Clear   Leukocytes,UA Negative Negative   Protein,UA Negative Negative/Trace   Glucose, UA Negative Negative   Ketones, UA Negative Negative   RBC, UA Negative Negative   Bilirubin, UA Negative Negative   Urobilinogen, Ur 0.2 0.2 - 1.0 mg/dL   Nitrite, UA Negative Negative   Microscopic Examination Comment   BLADDER SCAN AMB NON-IMAGING   Collection Time: 05/10/21  1:57 PM  Result Value Ref Range   Scan Result 65

## 2021-05-10 NOTE — Progress Notes (Signed)
post void residual=65 

## 2021-05-16 ENCOUNTER — Encounter: Payer: Self-pay | Admitting: *Deleted

## 2021-05-21 ENCOUNTER — Other Ambulatory Visit: Payer: Self-pay

## 2021-05-21 ENCOUNTER — Ambulatory Visit: Payer: No Typology Code available for payment source | Admitting: Family Medicine

## 2021-05-21 ENCOUNTER — Encounter: Payer: Self-pay | Admitting: Family Medicine

## 2021-05-21 VITALS — Ht 65.0 in

## 2021-05-21 DIAGNOSIS — L03116 Cellulitis of left lower limb: Secondary | ICD-10-CM | POA: Diagnosis not present

## 2021-05-21 DIAGNOSIS — I872 Venous insufficiency (chronic) (peripheral): Secondary | ICD-10-CM

## 2021-05-21 MED ORDER — FLUCONAZOLE 100 MG PO TABS: ORAL_TABLET | ORAL | 0 refills | Status: DC

## 2021-05-21 MED ORDER — LEVOFLOXACIN 500 MG PO TABS: 500.0000 mg | ORAL_TABLET | Freq: Every day | ORAL | 0 refills | Status: DC

## 2021-05-21 NOTE — Progress Notes (Signed)
Subjective:  Patient ID: Sydney [name], female    DOB: [birth year]  Age: 60 y.o. MRN: [id number]  CC: Cellulitis (Left lower extremitiy)   HPI Sydney [name] presents for recurrent pain and swelling of the LLE.There is erythema as well. She reports that it has occurred in the past. It got better so she quit wearing the compression stocking that had been recommended. IT has gotten worse over the last few weeks as a result. There is no involvement of the RLE. She denies fever. Elevation does help.   Depression screen [facility] 2/9 05/21/2021 05/08/2021 03/06/2021  Decreased Interest 1 0 1  Down, Depressed, Hopeless 1 0 1  PHQ - 2 Score 2 0 2  Altered sleeping 2 - 0  Tired, decreased energy 0 - 1  Change in appetite 0 - 1  Feeling bad or failure about yourself  0 - 0  Trouble concentrating 0 - 1  Moving slowly or fidgety/restless 0 - 0  Suicidal thoughts 0 - 0  PHQ-9 Score 4 - 5  Difficult doing work/chores Not difficult at all - Somewhat difficult  Some recent data might be hidden    History Samaia has a past medical history of Allergy, Anemia, Anxiety, Arthritis, Asthma, Atherosclerosis, Depression, Family history of adverse reaction to anesthesia, GERD (gastroesophageal reflux disease), History of hiatal hernia, History of kidney stones, Hypertension, Pneumonia, and TIA (transient ischemic attack) (2012).   She has a past surgical history that includes Appendectomy; Cholecystectomy; bladder tack; Abdominal hysterectomy; Cervical spine surgery; Carpal tunnel release (Bilateral); Hammer toe surgery (Right); Total hip arthroplasty (Left, 08/31/2019); Joint replacement; Total hip arthroplasty (Right, 12/05/2019); and Colonoscopy with propofol (N/A, 07/10/2020).   Her family history includes Alcohol abuse in her brother; Anxiety disorder in her mother; Arrhythmia in her mother; Cancer in her daughter and father; Depression in her mother; Heart disease in her mother; Hypertension in her mother; Lung  cancer in her father; Migraines in her sister.She reports that she has been smoking cigarettes. She has a 11.50 pack-year smoking history. She has never used smokeless tobacco. She reports current alcohol use. She reports that she does not use drugs.    ROS Review of Systems  Constitutional: Negative.   HENT: Negative.    Eyes:  Negative for visual disturbance.  Respiratory:  Negative for shortness of breath.   Cardiovascular:  Negative for chest pain.  Gastrointestinal:  Negative for abdominal pain.  Musculoskeletal:  Negative for arthralgias.   Objective:  Ht 5\' 5"  (1.651 m)   BMI 38.94 kg/m   BP Readings from Last 3 Encounters:  05/10/21 125/82  05/08/21 105/68  03/26/21 121/75    Wt Readings from Last 3 Encounters:  05/10/21 234 lb (106.1 kg)  05/08/21 232 lb 12.8 oz (105.6 kg)  03/06/21 232 lb (105.2 kg)     Physical Exam Constitutional:      General: She is not in acute distress.    Appearance: She is well-developed.  Cardiovascular:     Rate and Rhythm: Normal rate and regular rhythm.  Pulmonary:     Breath sounds: Normal breath sounds.  Musculoskeletal:        General: Normal range of motion.     Right lower leg: Edema (2+) present.  Skin:    General: Skin is warm and dry.     Findings: Erythema (LLE from ankle to mid calf) present.  Neurological:     Mental Status: She is alert and oriented to person, place, and time.  Psychiatric:  Mood and Affect: Mood normal.        Behavior: Behavior normal.      Assessment & Plan:   Sydney [name] was seen today for cellulitis.  Diagnoses and all orders for this visit:  Venous stasis dermatitis of left lower extremity -     US Venous Img Lower Unilateral Left; Future -     Ambulatory referral to Vascular Surgery  Cellulitis of left lower extremity -     US Venous Img Lower Unilateral Left; Future -     Ambulatory referral to Vascular Surgery  Other orders -     levofloxacin (LEVAQUIN) 500 MG tablet; Take  1 tablet (500 mg total) by mouth daily. For 10 days -     fluconazole (DIFLUCAN) 100 MG tablet; Take two with first dose. Then starting the next day take one daily until all are taken.      I am having Sydney [name] start on levofloxacin and fluconazole. I am also having her maintain her albuterol, HYDROcodone-acetaminophen, traZODone, cetirizine, amLODipine, metoprolol succinate, augmented betamethasone dipropionate, sertraline, rosuvastatin, esomeprazole, and Gemtesa.  Allergies as of 05/21/2021       Reactions   Bee Pollen Anaphylaxis, Swelling   Bee Venom Anaphylaxis, Swelling   Butorphanol Other (See Comments)   Not sure told by md she was allergic after surgery.   Meloxicam Anxiety   Mood disorder Altered her personality   Penicillins Anaphylaxis, Hives   Unable to Recall As child mom was told she is highly allergic Did it involve swelling of the face/tongue/throat, SOB, or low BP? Unknown Did it involve sudden or severe rash/hives, skin peeling, or any reaction on the inside of your mouth or nose? Unknown Did you need to seek medical attention at a hospital or doctor's office? Unknown When did it last happen?      childhood allergy If all above answers are "NO", may proceed with cephalosporin use.   Prochlorperazine Edisylate Anaphylaxis   Sulfa Antibiotics Anaphylaxis   Unable to Recall   Tramadol Anxiety   Didn't like the way it made her feel   Topiramate Nausea And Vomiting   Doxycycline Dermatitis, Hives, Itching, Photosensitivity, Rash        Medication List        Accurate as of May 21, 2021  8:41 PM. If you have any questions, ask your nurse or doctor.          albuterol 108 (90 Base) MCG/ACT inhaler Commonly known as: VENTOLIN HFA Inhale 2 puffs into the lungs every 6 (six) hours as needed for wheezing or shortness of breath.   amLODipine 5 MG tablet Commonly known as: NORVASC Take 1 tablet (5 mg total) by mouth daily.   augmented  betamethasone dipropionate 0.05 % cream Commonly known as: DIPROLENE-AF Apply topically.   cetirizine 10 MG tablet Commonly known as: ZYRTEC Take 10 mg by mouth daily.   esomeprazole 40 MG capsule Commonly known as: NexIUM Take 1 capsule (40 mg total) by mouth daily.   fluconazole 100 MG tablet Commonly known as: Diflucan Take two with first dose. Then starting the next day take one daily until all are taken. Started by: Mechele Claude, MD   Gemtesa 75 MG Tabs Generic drug: Vibegron Take 75 mg by mouth daily.   HYDROcodone-acetaminophen 5-325 MG tablet Commonly known as: NORCO/VICODIN Take 1 tablet by mouth every 4 (four) hours as needed for severe pain.   levofloxacin 500 MG tablet Commonly known as: LEVAQUIN Take 1 tablet (500 mg  total) by mouth daily. For 10 days Started by: Mechele Claude, MD   metoprolol succinate 25 MG 24 hr tablet Commonly known as: TOPROL-XL Take 1 tablet (25 mg total) by mouth daily.   rosuvastatin 10 MG tablet Commonly known as: CRESTOR Take 10 mg by mouth every other day.   sertraline 100 MG tablet Commonly known as: ZOLOFT Take 1.5 tablets (150 mg total) by mouth daily.   traZODone 100 MG tablet Commonly known as: DESYREL Take 1-2 tablets (100-200 mg total) by mouth at bedtime as needed for sleep. Place on file         Follow-up: Return in about 2 weeks (around 06/04/2021) for with Dr. Nadine Counts.  Mechele Claude, M.D.

## 2021-05-28 ENCOUNTER — Encounter: Payer: Self-pay | Admitting: Nurse Practitioner

## 2021-05-28 ENCOUNTER — Other Ambulatory Visit: Payer: Self-pay

## 2021-05-28 ENCOUNTER — Ambulatory Visit: Payer: No Typology Code available for payment source | Admitting: Nurse Practitioner

## 2021-05-28 VITALS — BP 109/70 | HR 80 | Temp 98.1°F | Resp 20 | Ht 65.0 in | Wt 236.0 lb

## 2021-05-28 DIAGNOSIS — I878 Other specified disorders of veins: Secondary | ICD-10-CM

## 2021-05-28 MED ORDER — CLINDAMYCIN HCL 300 MG PO CAPS: 300.0000 mg | ORAL_CAPSULE | Freq: Three times a day (TID) | ORAL | 0 refills | Status: DC

## 2021-05-28 MED ORDER — FUROSEMIDE 20 MG PO TABS: 20.0000 mg | ORAL_TABLET | Freq: Every day | ORAL | 3 refills | Status: DC

## 2021-05-28 NOTE — Progress Notes (Signed)
   Subjective:    Patient ID: Sydney [name], female    DOB: 07-Mar-1961, 60 y.o.   MRN: [id number]   Chief Complaint: Edema (Bilateral lower extrem. ) and Rash (All over )   HPI Patient come sin c/o bil lower ext with erythema. They feel tight. Left leg as been oozing fluid. She was seen by dr. Darlyn Read on 05/21/21 and ws dx with venous stasis and was started on levaquin. The levaquin has made her break out in rash and itching.     Review of Systems  Respiratory:  Positive for shortness of breath.   Cardiovascular:  Positive for leg swelling (bil).  Genitourinary: Negative.   Neurological: Negative.   All other systems reviewed and are negative.     Objective:   Physical Exam Constitutional:      Appearance: Normal appearance. She is obese.  Cardiovascular:     Rate and Rhythm: Normal rate and regular rhythm.     Heart sounds: Normal heart sounds.  Pulmonary:     Effort: Pulmonary effort is normal.     Breath sounds: Normal breath sounds.  Musculoskeletal:     Right lower leg: Edema (2+) present.     Left lower leg: Edema (2+) present.  Skin:    General: Skin is warm.     Findings: Erythema (bil lower ext and are warm to touch) present.  Neurological:     General: No focal deficit present.     Mental Status: She is alert and oriented to person, place, and time.  Psychiatric:        Mood and Affect: Mood normal.        Behavior: Behavior normal.    BP 109/70   Pulse 80   Temp 98.1 F (36.7 C)   Resp 20   Ht 5\' 5"  (1.651 m)   Wt 236 lb (107 kg)   SpO2 97%   BMI 39.27 kg/m        Assessment & Plan:  [name] in today with chief complaint of Edema (Bilateral lower extrem. ) and Rash (All over )   1. Venous stasis Stop levaquin Start clindamycin as prescribed Elevate legs when sitting Compression socks daily RTO Thursday if not improving - clindamycin (CLEOCIN) 300 MG capsule; Take 1 capsule (300 mg total) by mouth 3 (three) times daily.  Dispense: 30  capsule; Refill: 0 - furosemide (LASIX) 20 MG tablet; Take 1 tablet (20 mg total) by mouth daily.  Dispense: 30 tablet; Refill: 3    The above assessment and management plan was discussed with the patient. The patient verbalized understanding of and has agreed to the management plan. Patient is aware to call the clinic if symptoms persist or worsen. Patient is aware when to return to the clinic for a follow-up visit. Patient educated on when it is appropriate to go to the emergency department.   [name] Tuesday, FNP

## 2021-05-28 NOTE — Patient Instructions (Signed)
Stasis Dermatitis Stasis dermatitis is a long-term (chronic) skin condition that happens when veins can no longer pump blood back to the heart (poor circulation). This condition causes a red or brown scaly rash or sores (ulcers) from the pooling of blood (stasis). This condition usually affects the lower legs. It may affect one leg or both legs. Without treatment, severe stasis dermatitis can lead to other skin conditions and infections. What are the causes? This condition is caused by poor circulation. What increases the risk? You are more likely to develop this condition if: You are not very active. You stand for long periods of time. You have veins that have become enlarged and twisted (varicose veins). You have leg veins that are not strong enough to send blood back to the heart (venous insufficiency). You have had a blood clot. You have been pregnant many times. You have had vein surgery. You are obese. You have heart or kidney failure. You are 50 years of age or older. You have had injuries to your legs in the past. What are the signs or symptoms? Common early symptoms of this condition include: Itchiness in one or both of your legs. Swelling in your ankle or leg. This might get better overnight but be worse again during the day. Skin that looks thin on your ankle and leg. Red or brown marks that develop slowly. Skin that is dry, cracked, or easily irritated. Red, swollen skin that is sore or has a burning feeling. An achy or heavy feeling after you walk or stand for long periods of time. Pain. Later and more severe symptoms of this condition include: Skin that looks shiny. Small, open sores (ulcers). These are often red or purple and leak fluid. Skin that feels hard. Severe itching. A change in the shape or color of your lower legs. Severe pain. Difficulty walking. How is this diagnosed? This condition may be diagnosed based on: Your symptoms and medical history. A  physical exam. You may also have tests, including: Blood tests. Imaging tests to check blood flow (Doppler ultrasound). Allergy tests. You may need to see a health care provider who specializes in skin diseases (dermatologist). How is this treated? This condition may be treated with: Compression stockings or an elastic wrap to improve circulation. Medicines, such as: Corticosteroid creams and ointments. Non-corticosteroid medicines applied to the skin (topical). Medicine to reduce swelling in the legs (diuretics). Antibiotics. Medicine to relieve itching (antihistamines). A bandage (dressing). A wrap that contains zinc and gelatin (Unna boot). Follow these instructions at home: Skin care Moisturize your skin as told by your health care provider. Do not use moisturizers with fragrance. This can irritate your skin. Apply a cool, wet cloth (cool compress) to the affected areas. Do not scratch your skin. Do not rub your skin dry after a bath or shower. Gently pat your skin dry. Do not use scented soaps, detergents, or perfumes. Medicines Take or use over-the-counter and prescription medicines only as told by your health care provider. If you were prescribed an antibiotic medicine, take or use it as told by your health care provider. Do not stop taking or using the antibiotic even if your condition improves. Activity Walk as told by your health care provider. Walking increases blood flow. Do calf and ankle exercises throughout the day as told by your health care provider. This will help increase blood flow. Raise (elevate) your legs above the level of your heart when you are sitting or lying down. Lifestyle Work with your health   care provider to lose weight, if needed. Do not cross your legs when you sit. Do not stand or sit in one position for long periods of time. Wear comfortable, loose-fitting clothing. Circulation in your legs will be worse if you wear tight pants, belts, and  waistbands. Do not use any products that contain nicotine or tobacco, such as cigarettes, e-cigarettes, and chewing tobacco. If you need help quitting, ask your health care provider. General instructions If you were asked to use one of the following to help with your condition, follow instructions from your health care provider on how to: Remove and change any dressing. Wear compression stockings. These stockings help to prevent blood clots and reduce swelling in your legs. Wear the [employer]. Keep all follow-up visits as told by your health care provider. This is important. Contact a health care provider if: Your condition does not improve with treatment. Your condition gets worse. You have signs of infection in the affected area. Watch for: Swelling. Tenderness. Redness. Soreness. Warmth. You have a fever. Get help right away if: You notice red streaks coming from the affected area. Your bone or joint underneath the affected area becomes painful after the skin has healed. The affected area turns darker. You feel a deep pain in your leg or groin. You are short of breath. Summary Stasis dermatitis is a long-term (chronic) skin condition that happens when veins can no longer pump blood back to the heart (poor circulation). Wear compression stockings as told by your health care provider. These stockings help to prevent blood clots and reduce swelling in your legs. Follow instructions from your health care provider about activity, medicines, and lifestyle. Contact a health care provider if you have a fever or have signs of infection in the affected area. Keep all follow-up visits as told by your health care provider. This is important. This information is not intended to replace advice given to you by your health care provider. Make sure you discuss any questions you have with your health care provider. Document Revised: 09/17/2020 Document Reviewed: 09/17/2020 Elsevier Patient Education   2022 [employer].

## 2021-06-05 ENCOUNTER — Other Ambulatory Visit: Payer: Self-pay

## 2021-06-05 DIAGNOSIS — I872 Venous insufficiency (chronic) (peripheral): Secondary | ICD-10-CM

## 2021-06-07 ENCOUNTER — Ambulatory Visit: Payer: No Typology Code available for payment source | Admitting: Family Medicine

## 2021-06-07 ENCOUNTER — Other Ambulatory Visit: Payer: Self-pay

## 2021-06-07 VITALS — BP 112/70 | HR 63 | Temp 97.1°F | Ht 65.0 in | Wt 235.0 lb

## 2021-06-07 DIAGNOSIS — L03116 Cellulitis of left lower limb: Secondary | ICD-10-CM | POA: Diagnosis not present

## 2021-06-07 DIAGNOSIS — M25511 Pain in right shoulder: Secondary | ICD-10-CM

## 2021-06-07 DIAGNOSIS — M7662 Achilles tendinitis, left leg: Secondary | ICD-10-CM | POA: Diagnosis not present

## 2021-06-07 NOTE — Progress Notes (Signed)
Subjective: CC: Right shoulder pain PCP: Raliegh Ip, DO Sydney [name] is a 60 y.o. female presenting to clinic today for:  1.  Right shoulder pain Patient reports acute onset of right shoulder pain about 2 weeks ago.  She had a fall last year and had some shoulder pain at that time but she notes that that had resolved.  She had an MRI with her orthopedist 1 year ago and there was apparently a small tear that did not require surgical intervention.  She did not do anything unusual prior to onset of this pain.  Denies any change in activity, heavy lifting, etc.  She points to the posterior lateral aspect of the arm as area of pain but notes that it radiates down the arm.  Denies any shock or electric type sensation down the arm.  Admits to weakness.  2.  Left Achilles pain Patient reports that she has been having some pain and burning in the left Achilles heel.  Sometimes the pain is more prominent at the end of the day.  No preceding injury.  She has chronic issues in the left lower extremity with a recent cellulitis that she is currently being treated for.   ROS: Per HPI  Allergies  Allergen Reactions   Bee Pollen Anaphylaxis and Swelling   Bee Venom Anaphylaxis and Swelling   Butorphanol Other (See Comments)    Not sure told by md she was allergic after surgery.    Meloxicam Anxiety    Mood disorder Altered her personality    Penicillins Anaphylaxis and Hives    Unable to Recall As child mom was told she is highly allergic Did it involve swelling of the face/tongue/throat, SOB, or low BP? Unknown Did it involve sudden or severe rash/hives, skin peeling, or any reaction on the inside of your mouth or nose? Unknown Did you need to seek medical attention at a hospital or doctor's office? Unknown When did it last happen?      childhood allergy If all above answers are "NO", may proceed with cephalosporin use.    Prochlorperazine Edisylate Anaphylaxis   Sulfa  Antibiotics Anaphylaxis    Unable to Recall   Tramadol Anxiety    Didn't like the way it made her feel    Levaquin [Levofloxacin] Itching   Topiramate Nausea And Vomiting   Doxycycline Dermatitis, Hives, Itching, Photosensitivity and Rash   Past Medical History:  Diagnosis Date   Allergy    Anemia    history of   Anxiety    Arthritis    Asthma    Atherosclerosis    Depression    Family history of adverse reaction to anesthesia    pt's mother felt everything and couldn't speak during a gallbladder surgery   GERD (gastroesophageal reflux disease)    History of hiatal hernia    History of kidney stones    Hypertension    Pneumonia    a few times   TIA (transient ischemic attack) 2012    Current Outpatient Medications:    albuterol (VENTOLIN HFA) 108 (90 Base) MCG/ACT inhaler, Inhale 2 puffs into the lungs every 6 (six) hours as needed for wheezing or shortness of breath., Disp: , Rfl:    amLODipine (NORVASC) 5 MG tablet, Take 1 tablet (5 mg total) by mouth daily., Disp: 90 tablet, Rfl: 2   augmented betamethasone dipropionate (DIPROLENE-AF) 0.05 % cream, Apply topically., Disp: , Rfl:    cetirizine (ZYRTEC) 10 MG tablet, Take 10 mg by mouth  daily., Disp: , Rfl:    clindamycin (CLEOCIN) 300 MG capsule, Take 1 capsule (300 mg total) by mouth 3 (three) times daily., Disp: 30 capsule, Rfl: 0   esomeprazole (NEXIUM) 40 MG capsule, Take 1 capsule (40 mg total) by mouth daily., Disp: 30 capsule, Rfl: 3   furosemide (LASIX) 20 MG tablet, Take 1 tablet (20 mg total) by mouth daily., Disp: 30 tablet, Rfl: 3   HYDROcodone-acetaminophen (NORCO/VICODIN) 5-325 MG tablet, Take 1 tablet by mouth every 4 (four) hours as needed for severe pain. (Patient not taking: Reported on 06/07/2021), Disp: 30 tablet, Rfl: 0   metoprolol succinate (TOPROL-XL) 25 MG 24 hr tablet, Take 1 tablet (25 mg total) by mouth daily., Disp: 90 tablet, Rfl: 2   rosuvastatin (CRESTOR) 10 MG tablet, Take 10 mg by mouth  every other day., Disp: , Rfl:    sertraline (ZOLOFT) 100 MG tablet, Take 1.5 tablets (150 mg total) by mouth daily., Disp: 90 tablet, Rfl: 3   traZODone (DESYREL) 100 MG tablet, Take 1-2 tablets (100-200 mg total) by mouth at bedtime as needed for sleep. Place on file, Disp: 180 tablet, Rfl: 1   Vibegron (GEMTESA) 75 MG TABS, Take 75 mg by mouth daily., Disp: 28 tablet, Rfl: 0 Social History   Socioeconomic History   Marital status: Married    Spouse name: Not on file   Number of children: 3   Years of education: Not on file   Highest education level: Not on file  Occupational History   Not on file  Tobacco Use   Smoking status: Every Day    Packs/day: 0.50    Years: 23.00    Pack years: 11.50    Types: Cigarettes   Smokeless tobacco: Never  Vaping Use   Vaping Use: Never used  Substance and Sexual Activity   Alcohol use: Yes    Comment: occ   Drug use: Never   Sexual activity: Not Currently  Other Topics Concern   Not on file  Social History Narrative   Recently relocated to [place] from [place].  She resides at home with her husband.   She has 3 children but 1 passed away from cancer.   Social Determinants of Health   Financial Resource Strain: Not on file  Food Insecurity: Not on file  Transportation Needs: Not on file  Physical Activity: Not on file  Stress: Not on file  Social Connections: Not on file  Intimate Partner Violence: Not on file   Family History  Problem Relation Age of Onset   Anxiety disorder Mother    Depression Mother    Heart disease Mother    Hypertension Mother    Arrhythmia Mother    Cancer Father        colon, age greater than 43   Lung cancer Father    Migraines Sister    Cancer Daughter        nerve sheath sarcoma   Alcohol abuse Brother    Breast cancer Neg Hx     Objective: Office vital signs reviewed. BP 112/70   Pulse 63   Temp (!) 97.1 F (36.2 C) (Temporal)   Ht 5\' 5"  (1.651 m)   Wt 235 lb (106.6 kg)   SpO2  95%   BMI 39.11 kg/m   Physical Examination:  General: Awake, alert, well nourished, No acute distress MSK:  Right shoulder: Only able to AB duct, flex shoulder to about 90 degrees.  She is unable to internally rotate shoulder  without pain.  No tenderness palpation to the shoulder.  No palpable deformities.  She has weakness and pain with full and empty can testing  Achilles: No deformities.  Appears to be intact.  Ambulating independently Skin: Dry, flaky.  Left lower extremity with erythema and mild nonpitting edema  Assessment/ Plan: 60 y.o. female   Acute pain of right shoulder  Left Achilles tendinitis  Left leg cellulitis  I am concerned that the initial right shoulder injury from 1 year ago it may have progressed.  She apparently had some type of partial tear in that right shoulder that was identified by her orthopedist.  Advised her to follow-up with him as I anticipate she may need repeat MRI.  Her exam was notable for limited active range of motion in flexion, AB duction and internal rotation of the right shoulder.  She also had pain and weakness with empty can testing.  Suspect left Achilles tendinitis.  May be related to the underlying issues that she is had in that left lower extremity.  I offered referral to sports medicine center for direct visualization under ultrasound and possible treatment but she would like to try and proceed with the imaging studies with vascular first.  I think that is totally reasonable.  She was able to ambulate independently today.  Left leg cellulitis appears to be stable.  Finish off antibiotics as prescribed.  Follow-up with vascular as directed  No orders of the defined types were placed in this encounter.  No orders of the defined types were placed in this encounter.    Raliegh Ip, DO [facility] [phone]

## 2021-06-12 ENCOUNTER — Other Ambulatory Visit: Payer: Self-pay

## 2021-06-12 ENCOUNTER — Ambulatory Visit: Payer: No Typology Code available for payment source | Admitting: Urology

## 2021-06-12 ENCOUNTER — Encounter: Payer: Self-pay | Admitting: Urology

## 2021-06-12 VITALS — BP 131/78 | HR 76

## 2021-06-12 DIAGNOSIS — N3941 Urge incontinence: Secondary | ICD-10-CM | POA: Diagnosis not present

## 2021-06-12 DIAGNOSIS — R350 Frequency of micturition: Secondary | ICD-10-CM | POA: Diagnosis not present

## 2021-06-12 LAB — MICROSCOPIC EXAMINATION
Epithelial Cells (non renal): NONE SEEN /hpf (ref 0–10)
Renal Epithel, UA: NONE SEEN /hpf

## 2021-06-12 LAB — URINALYSIS, ROUTINE W REFLEX MICROSCOPIC
Bilirubin, UA: NEGATIVE
Glucose, UA: NEGATIVE
Ketones, UA: NEGATIVE
Leukocytes,UA: NEGATIVE
Nitrite, UA: NEGATIVE
Protein,UA: NEGATIVE
Specific Gravity, UA: 1.02 (ref 1.005–1.030)
Urobilinogen, Ur: 1 mg/dL (ref 0.2–1.0)
pH, UA: 7 (ref 5.0–7.5)

## 2021-06-12 LAB — BLADDER SCAN AMB NON-IMAGING: PVR: 0 WU

## 2021-06-12 MED ORDER — MIRABEGRON ER 50 MG PO TB24: 50.0000 mg | ORAL_TABLET | Freq: Every day | ORAL | 0 refills | Status: DC

## 2021-06-12 NOTE — Progress Notes (Signed)
Urological Symptom Review  Patient is experiencing the following symptoms: Get up at night to urinate Leakage of urine Kidney Stones  Review of Systems  Gastrointestinal (upper)  : Indigestion/heartburn  Gastrointestinal (lower) : Negative for lower GI symptoms  Constitutional : Negative for symptoms  Skin: Skin rash/lesion Itching  Eyes: Negative for eye symptoms  Ear/Nose/Throat : Negative for Ear/Nose/Throat symptoms  Hematologic/Lymphatic: Negative for Hematologic/Lymphatic symptoms  Cardiovascular : Leg swelling  Respiratory : Negative for respiratory symptoms  Endocrine: Negative for endocrine symptoms  Musculoskeletal: Back pain Joint pain  Neurological: Negative for neurological symptoms  Psychologic: Negative for psychiatric symptoms

## 2021-06-12 NOTE — Progress Notes (Signed)
Assessment: 1. Urge incontinence   2. Urine frequency     Plan: Trial of Myrbetriq 25 mg daily x 2 weeks, then 50 mg daily x 2 weeks.  Samples given. Recommended that she hold off on starting the samples until she feels like her lower extremity edema has resolved. I also discussed the potential need for further evaluation with cystoscopy and urodynamics given her prior history of bladder surgery. I requested that she call me with result of medication in 4-6 weeks  Chief Complaint:  Chief Complaint  Patient presents with   Urinary Incontinence     History of Present Illness:  Sydney [name] is a 60 y.o. year old female who is seen for further evaluation of urinary incontinence.  Her symptoms have been present for several years but have recently worsened.  She reported worsening of her incontinence after a recent back injury.  She reported frequency, urgency, nocturia x2, incontinence with urgency as well as without sensory awareness.  She is using 2 mini pads per day.  She had been on tolterodine for approximately 2 months without improvement in her symptoms.  No dysuria or gross hematuria.  At the time of her visit in 10/22, she was on nitrofurantoin for a UTI.  No history of frequent UTIs.   She is status post a bladder sling in [place] a number of years ago.  She has not noticed any vaginal bulge.  She does have a history of kidney stones passing multiple stones.  Her last episode was approximately 1 year ago.  She does not have any problems with fecal incontinence.  No constipation. PVR = 65 ml. She was given a trial of Gemtesa 75 mg daily at her visit in 10/22.  She returns today for follow-up.  She noted improvement in her frequency and incontinence with the Gemtesa.  She feels like the medication made it harder for her to empty her bladder and she reported lower extremity edema.  This was despite being on Lasix 20 mg daily.  She has since run out of the samples and has  noted an improvement in her lower extremity edema.  Of note, she is being treated for lower extremity cellulitis as well.  She is not having any dysuria or gross hematuria.  Portions of the above documentation were copied from a prior visit for review purposes only.   Past Medical History:  Past Medical History:  Diagnosis Date   Allergy    Anemia    history of   Anxiety    Arthritis    Asthma    Atherosclerosis    Depression    Family history of adverse reaction to anesthesia    pt's mother felt everything and couldn't speak during a gallbladder surgery   GERD (gastroesophageal reflux disease)    History of hiatal hernia    History of kidney stones    Hypertension    Pneumonia    a few times   TIA (transient ischemic attack) 2012    Past Surgical History:  Past Surgical History:  Procedure Laterality Date   ABDOMINAL HYSTERECTOMY     abdominal   APPENDECTOMY     bladder tack     CARPAL TUNNEL RELEASE Bilateral    CERVICAL SPINE SURGERY     5-6, 6-7   CHOLECYSTECTOMY     COLONOSCOPY WITH PROPOFOL N/A 07/10/2020   Procedure: COLONOSCOPY WITH PROPOFOL;  Surgeon: Lanelle Bal, DO;  [facility];  Service: Endoscopy;  Laterality: N/A;  12:15pm   HAMMER TOE SURGERY Right    JOINT REPLACEMENT     TOTAL HIP 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   Procedure: LEFT TOTAL HIP ARTHROPLASTY -DIRECT ANTERIOR;  Surgeon: Marybelle Killings, MD;  Location: [facility];  Service: Orthopedics;  Laterality: Left;   TOTAL HIP ARTHROPLASTY Right 12/05/2019   Procedure: RIGHT TOTAL HIP ARTHROPLASTY ANTERIOR APPROACH  DIRECT ANTERIOR;  Surgeon: Marybelle Killings, MD;  Location: [facility];  Service: Orthopedics;  Laterality: Right;    Allergies:  Allergies  Allergen Reactions   Bee Pollen Anaphylaxis and Swelling   Bee Venom Anaphylaxis and Swelling   Butorphanol Other (See Comments)    Not sure told by md she was allergic after surgery.    Meloxicam Anxiety    Mood disorder Altered her  personality    Penicillins Anaphylaxis and Hives    Unable to Recall As child mom was told she is highly allergic Did it involve swelling of the face/tongue/throat, SOB, or low BP? Unknown Did it involve sudden or severe rash/hives, skin peeling, or any reaction on the inside of your mouth or nose? Unknown Did you need to seek medical attention at a hospital or doctor's office? Unknown When did it last happen?      childhood allergy If all above answers are "NO", may proceed with cephalosporin use.    Prochlorperazine Edisylate Anaphylaxis   Sulfa Antibiotics Anaphylaxis    Unable to Recall   Tramadol Anxiety    Didn't like the way it made her feel    Levaquin [Levofloxacin] Itching   Topiramate Nausea And Vomiting   Doxycycline Dermatitis, Hives, Itching, Photosensitivity and Rash    Family History:  Family History  Problem Relation Age of Onset   Anxiety disorder Mother    Depression Mother    Heart disease Mother    Hypertension Mother    Arrhythmia Mother    Cancer Father        colon, age greater than 36   Lung cancer Father    Migraines Sister    Cancer Daughter        nerve sheath sarcoma   Alcohol abuse Brother    Breast cancer Neg Hx     Social History:  Social History   Tobacco Use   Smoking status: Every Day    Packs/day: 0.50    Years: 23.00    Pack years: 11.50    Types: Cigarettes   Smokeless tobacco: Never  Vaping Use   Vaping Use: Never used  Substance Use Topics   Alcohol use: Yes    Comment: occ   Drug use: Never    ROS: Constitutional:  Negative for fever, chills, weight loss CV: Negative for chest pain, previous MI, hypertension Respiratory:  Negative for shortness of breath, wheezing, sleep apnea, frequent cough GI:  Negative for nausea, vomiting, bloody stool, GERD  Physical exam: BP 131/78   Pulse 76  GENERAL APPEARANCE:  Well appearing, well developed, well nourished, NAD HEENT:  Atraumatic, normocephalic, oropharynx  clear NECK:  Supple without lymphadenopathy or thyromegaly ABDOMEN:  Soft, non-tender, no masses EXTREMITIES:  Moves all extremities well, without clubbing, cyanosis, or edema NEUROLOGIC:  Alert and oriented x 3, normal gait, CN II-XII grossly intact MENTAL STATUS:  appropriate BACK:  Non-tender to palpation, No CVAT SKIN:  Warm, dry, and intact   Results: U/A dipstick negative  PVR:  0 ml

## 2021-06-15 ENCOUNTER — Other Ambulatory Visit: Payer: Self-pay | Admitting: Family Medicine

## 2021-06-15 DIAGNOSIS — I10 Essential (primary) hypertension: Secondary | ICD-10-CM

## 2021-06-18 ENCOUNTER — Other Ambulatory Visit: Payer: Self-pay

## 2021-06-18 ENCOUNTER — Ambulatory Visit (INDEPENDENT_AMBULATORY_CARE_PROVIDER_SITE_OTHER): Payer: No Typology Code available for payment source

## 2021-06-18 ENCOUNTER — Ambulatory Visit (INDEPENDENT_AMBULATORY_CARE_PROVIDER_SITE_OTHER): Payer: No Typology Code available for payment source | Admitting: Orthopaedic Surgery

## 2021-06-18 ENCOUNTER — Encounter: Payer: Self-pay | Admitting: Orthopaedic Surgery

## 2021-06-18 ENCOUNTER — Encounter: Payer: Self-pay | Admitting: Nurse Practitioner

## 2021-06-18 ENCOUNTER — Ambulatory Visit: Payer: No Typology Code available for payment source | Admitting: Nurse Practitioner

## 2021-06-18 VITALS — BP 108/70 | HR 71 | Temp 97.6°F | Resp 20 | Ht 65.0 in | Wt 232.0 lb

## 2021-06-18 VITALS — BP 108/70 | Ht 65.0 in | Wt 232.0 lb

## 2021-06-18 DIAGNOSIS — M25511 Pain in right shoulder: Secondary | ICD-10-CM | POA: Diagnosis not present

## 2021-06-18 DIAGNOSIS — I878 Other specified disorders of veins: Secondary | ICD-10-CM

## 2021-06-18 DIAGNOSIS — L299 Pruritus, unspecified: Secondary | ICD-10-CM

## 2021-06-18 DIAGNOSIS — L03119 Cellulitis of unspecified part of limb: Secondary | ICD-10-CM

## 2021-06-18 DIAGNOSIS — M7541 Impingement syndrome of right shoulder: Secondary | ICD-10-CM

## 2021-06-18 MED ORDER — LIDOCAINE HCL 1 % IJ SOLN
0.5000 mL | INTRAMUSCULAR | Status: AC | PRN
Start: 2021-06-18 — End: 2021-06-18
  Administered 2021-06-18: .5 mL

## 2021-06-18 MED ORDER — CLINDAMYCIN HCL 300 MG PO CAPS
300.0000 mg | ORAL_CAPSULE | Freq: Three times a day (TID) | ORAL | 0 refills | Status: DC
Start: 2021-06-18 — End: 2021-06-25

## 2021-06-18 MED ORDER — BUPIVACAINE HCL 0.25 % IJ SOLN
4.0000 mL | INTRAMUSCULAR | Status: AC | PRN
  Administered 2021-06-18: 4 mL via INTRA_ARTICULAR

## 2021-06-18 MED ORDER — METHYLPREDNISOLONE ACETATE 80 MG/ML IJ SUSP
80.0000 mg | Freq: Once | INTRAMUSCULAR | Status: AC
Start: 2021-06-18 — End: 2021-06-18
  Administered 2021-06-18: 80 mg via INTRAMUSCULAR

## 2021-06-18 MED ORDER — METHYLPREDNISOLONE ACETATE 40 MG/ML IJ SUSP
40.0000 mg | INTRAMUSCULAR | Status: AC | PRN
  Administered 2021-06-18: 40 mg via INTRA_ARTICULAR

## 2021-06-18 MED ORDER — PREDNISONE 20 MG PO TABS: 40.0000 mg | ORAL_TABLET | Freq: Every day | ORAL | 0 refills | Status: AC

## 2021-06-18 NOTE — Progress Notes (Signed)
Office Visit Note   Patient: Sydney [name]           Date of Birth: 1960/11/16           MRN: [id number] Visit Date: 06/18/2021              Requested by: Raliegh Ip, DO [address] [place],  [place] [address] PCP: Raliegh Ip, DO   Assessment & Plan: Visit Diagnoses:  1. Acute pain of right shoulder   2. Impingement syndrome of right shoulder     Plan: Subacromial injection performed.  If she does not get fairly good relief with this then MRI scan will be needed to rule out complete rotator cuff tear of the right shoulder.  Today's visit we reviewed the previous MRI I gave her copy of the old MRI scan.  Follow-Up Instructions: Return in about 4 weeks (around 07/16/2021).   Orders:  Orders Placed This Encounter  Procedures   Large Joint Inj   XR Shoulder Right   No orders of the defined types were placed in this encounter.     Procedures: Large Joint Inj: R subacromial bursa on 06/18/2021 2:12 PM Indications: pain Details: 22 G 1.5 in needle  Arthrogram: No  Medications: 4 mL bupivacaine 0.25 %; 40 mg methylPREDNISolone acetate 40 MG/ML; 0.5 mL lidocaine 1 % Outcome: tolerated well, no immediate complications Procedure, treatment alternatives, risks and benefits explained, specific risks discussed. Consent was given by the patient. Immediately prior to procedure a time out was called to verify the correct patient, procedure, equipment, support staff and site/side marked as required. Patient was prepped and draped in the usual sterile fashion.      Clinical Data: No additional findings.   Subjective: Chief Complaint  Patient presents with   Right Shoulder - Pain    HPI 60 year old female returns with increased problems with her shoulder for 3 to 4 weeks.  Previous MRI 2021 showed rotator cuff supraspinatus and subscapularis tendinosis with some subscap intersubstance tearing.  Moderate glenohumeral and AC arthritis.  Patient states she has had  great difficulty washing her hair and has to bend her head down so she can bend her elbow to reach her head.  Problems getting dressed.  Pain with abduction of the right shoulder and flexion of the right shoulder.  No subluxation no falls no recent injuries.  She is used Tylenol which has not worked.  Patient's had previous total of arthroplasties by me right and left in 2021 doing well.  Review of Systems all other systems noncontributory to HPI.   Objective: Vital Signs: BP 108/70   Ht 5\' 5"  (1.651 m)   Wt 232 lb (105.2 kg)   BMI 38.61 kg/m   Physical Exam Constitutional:      Appearance: She is well-developed.  HENT:     Head: Normocephalic.     Right Ear: External ear normal.     Left Ear: External ear normal. There is no impacted cerumen.  Eyes:     Pupils: Pupils are equal, round, and reactive to light.  Neck:     Thyroid: No thyromegaly.     Trachea: No tracheal deviation.  Cardiovascular:     Rate and Rhythm: Normal rate.  Pulmonary:     Effort: Pulmonary effort is normal.  Abdominal:     Palpations: Abdomen is soft.  Musculoskeletal:     Cervical back: No rigidity.  Skin:    General: Skin is warm and dry.  Neurological:  Mental Status: She is alert and oriented to person, place, and time.  Psychiatric:        Behavior: Behavior normal.    Ortho Exam positive drop arm test positive impingement positive Neer.  Negative Hawkins.  Long head of the biceps moderately tender.  No distal biceps muscle migration.  Upper extremity reflexes 2+ and symmetrical no brachial plexus tenderness.  Good cervical range of motion.  Specialty Comments:  No specialty comments available.  Imaging: No results found.   PMFS History: Patient Active Problem List   Diagnosis Date Noted   Urine frequency 05/10/2021   Cellulitis of left leg 01/18/2021   Rash 01/18/2021   History of bilateral hip arthroplasty 01/03/2021   Upper respiratory tract infection 12/30/2020    Non-recurrent acute serous otitis media of right ear 12/30/2020   Pharyngitis 10/29/2020   Trochanteric bursitis, left hip 09/13/2020   Closed fracture of multiple pubic rami, left, sequela 08/16/2020   H/O adenomatous polyp of colon 05/23/2020   FH: colon cancer in relative diagnosed at >57 years old 05/23/2020   Spinal stenosis of lumbar region 05/03/2020   Impingement syndrome of right shoulder 05/03/2020   Other secondary scoliosis, lumbar region 03/01/2020   H/O total hip arthroplasty 12/06/2019   Arthritis of right hip 12/05/2019   Status post total replacement of left hip 09/15/2019   Back pain 07/01/2018   DDD (degenerative disc disease), lumbosacral 07/01/2018   Venous insufficiency of both lower extremities 11/17/2016   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 level with radiculopathy 08/18/2016   Morbid obesity with BMI of 40.0-44.9, adult (HCC) 07/28/2016   Chronic pain syndrome 10/24/2015   Dysfunction of both eustachian tubes 07/26/2014   Calculus of left kidney 04/25/2014   Impaired fasting glucose 04/05/2014   History of bladder surgery 04/03/2014   History of hysterectomy 04/03/2014   Urge incontinence 04/03/2014   Asthma 07/28/2013   Nontoxic uninodular goiter 07/28/2013   Solitary pulmonary nodule 07/28/2013   Lumbar radiculopathy 04/20/2013   Insomnia 03/22/2013   Essential hypertension 08/18/2011   Tobacco use disorder 08/18/2011   Moderate episode of recurrent major depressive disorder (HCC) 04/03/2011   Other B-complex deficiencies 08/16/2010   Family history of cerebrovascular accident (CVA) 04/17/2010   Family history of malignant neoplasm of gastrointestinal tract 04/17/2010   Family history of other cardiovascular diseases(V17.49) 04/17/2010   Past Medical History:  Diagnosis Date   Allergy    Anemia    history of   Anxiety    Arthritis    Asthma    Atherosclerosis    Depression    Family history of adverse reaction to anesthesia    pt's mother felt  everything and couldn't speak during a gallbladder surgery   GERD (gastroesophageal reflux disease)    History of hiatal hernia    History of kidney stones    Hypertension    Pneumonia    a few times   TIA (transient ischemic attack) 2012    Family History  Problem Relation Age of Onset   Anxiety disorder Mother    Depression Mother    Heart disease Mother    Hypertension Mother    Arrhythmia Mother    Cancer Father        colon, age greater than 19   Lung cancer Father    Migraines Sister    Cancer Daughter        nerve sheath sarcoma   Alcohol abuse Brother    Breast cancer Neg Hx  Past Surgical History:  Procedure Laterality Date   ABDOMINAL HYSTERECTOMY     abdominal   APPENDECTOMY     bladder tack     CARPAL TUNNEL RELEASE Bilateral    CERVICAL SPINE SURGERY     5-6, 6-7   CHOLECYSTECTOMY     COLONOSCOPY WITH PROPOFOL N/A 07/10/2020   Procedure: COLONOSCOPY WITH PROPOFOL;  Surgeon: Lanelle Bal, DO;  [facility];  Service: Endoscopy;  Laterality: N/A;  12:15pm   HAMMER TOE SURGERY Right    JOINT REPLACEMENT     TOTAL HIP 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   Procedure: LEFT TOTAL HIP ARTHROPLASTY -DIRECT ANTERIOR;  Surgeon: Eldred Manges, MD;  Location[facility];  Service: Orthopedics;  Laterality: Left;   TOTAL HIP ARTHROPLASTY Right 12/05/2019   Procedure: RIGHT TOTAL HIP ARTHROPLASTY ANTERIOR APPROACH  DIRECT ANTERIOR;  Surgeon: Eldred Manges, MD;  Location: [facility];  Service: Orthopedics;  Laterality: Right;   Social History   Occupational History   Not on file  Tobacco Use   Smoking status: Every Day    Packs/day: 0.50    Years: 23.00    Pack years: 11.50    Types: Cigarettes   Smokeless tobacco: Never  Vaping Use   Vaping Use: Never used  Substance and Sexual Activity   Alcohol use: Yes    Comment: occ   Drug use: Never   Sexual activity: Not Currently

## 2021-06-18 NOTE — Patient Instructions (Signed)
Unna Boot Care An Unna boot is a type of bandage (dressing) for the foot and leg. The dressing is a gauze wrap that is soaked with a type of medicine called zinc oxide. The gauze may also include other lotions and medicines that help in wound healing, such as calamine. An Unna boot may be used to treat: Open sores (ulcers) on the foot, heel, or leg. Swelling from disorders that affect the veins or lymphatic system (lymphedema). Skin conditions such as chronic inflammation caused by poor blood flow (stasis dermatitis). The dressing is applied by a health care provider. The gauze is wrapped around your lower extremity in several layers, usually starting at the toes and going upward to the knee. A dry outer wrap goes over the medicated wrap for support and compression.  Before applying the Unna boot, your health care provider will clean your leg and foot and may apply an antibiotic ointment. You may be asked to raise (elevate) your leg for a while to reduce swelling before the boot is applied. The boot will dry and harden after it is applied. The boot may need to be changed or replaced about twice a week. Follow these instructions at home: Boot care Wear the Unna boot as told by your health care provider. You may need to wear a slipper or shoe over the boot that is one or two sizes larger than normal. Check the skin around the boot every day. Tell your health care provider about any concerns. Do not stick anything inside the boot to scratch your skin. Doing that increases your risk of infection. Keep your Unna boot clean and dry. Check every day for signs of infection. Check for: Redness, swelling, or pain in your foot or toes. Fluid or blood coming from the boot. Pus or a bad smell coming from the boot. Remove the boot and call your health care provider if you have signs of poor blood flow, such as: Your toes tingle or become numb. Your toes turn cold or turn blue or pale. Your toes are more  swollen or painful. You are unable to move your toes. Activity You may walk with the boot once it has dried. Ask your health care provider how much walking is safe for you. Avoid sitting for a long time without moving. Get up to take short walks as told by your health care provider. This is important to improve blood flow. Bathing Do not take baths, swim, or use a hot tub until your health care provider approves. Ask your health care provider if you may take showers. If your health care provider approves a bath or a shower, do not let the Unna boot get wet. If you take a shower, cover the boot with a watertight covering. If you take a bath, keep your leg with the boot out of the tub. General instructions Keep your leg elevated above the level of your heart while you are sitting or lying down. This will decrease swelling. Do not sit with your knee bent for long periods of time. Take over-the-counter and prescription medicines only as told by your health care provider. Do not use any products that contain nicotine or tobacco, such as cigarettes, e-cigarettes, and chewing tobacco. These can delay healing. If you need help quitting, ask your health care provider. Keep all follow-up visits as told by your health care provider. This is important. Contact a health care provider if: Your skin feels itchy inside the boot. You have a burning sensation, a   rash, or itchy, red, swollen areas of skin (hives) in the boot area. You have a fever or chills. You have any signs of infection, such as: New redness, swelling, or pain. More fluid or blood coming from the boot. Pus or a bad smell coming from the boot. You have increased numbness or pain in your foot or toes. You have any changes in skin color on your foot or toes, such as the skin turning blue or pale or developing patchy areas with spots. Your boot has been damaged or feels like it is no longer fitting properly. Summary An Unna boot is a type of  bandage (dressing) system for the foot and leg. The dressing is a gauze wrap that is soaked with a type of medicine (zinc oxide) to treat foot, heel, or leg ulcers, swelling from disorders that affect the veins or lymphatic system (lymphedema), and skin conditions caused by poor blood flow (stasis dermatitis). This dressing is applied by a health care provider. After it is applied, the boot will dry and harden. The boot may need to be changed or replaced about twice a week. Let your health care provider know if you have any signs of poor blood flow or infection. This information is not intended to replace advice given to you by your health care provider. Make sure you discuss any questions you have with your health care provider. Document Revised: 10/26/2018 Document Reviewed: 03/17/2018 [employer] Patient Education  2022 [employer]  

## 2021-06-18 NOTE — Progress Notes (Signed)
   Subjective:    Patient ID: Sydney [name], female    DOB: [birth year], 60 y.o.   MRN: [id number]   Chief Complaint: Bilateral leg swelling (Low grade fever last night. Hands itching and burning too)   HPI Patient comes in today c/o of bil  lower ext edema and erythema. Lower ext have been weeping fluid.Very itchy. Itching is all over her body. Denies any new soaps or detergents.    Review of Systems  Constitutional:  Negative for diaphoresis.  Eyes:  Negative for pain.  Respiratory:  Negative for shortness of breath.   Cardiovascular:  Positive for leg swelling. Negative for chest pain and palpitations.  Gastrointestinal:  Negative for abdominal pain.  Endocrine: Negative for polydipsia.  Skin:  Negative for rash.       All over itching with no rash  Neurological:  Negative for dizziness, weakness and headaches.  Hematological:  Does not bruise/bleed easily.  All other systems reviewed and are negative.     Objective:   Physical Exam Vitals and nursing note reviewed.  Constitutional:      Appearance: Normal appearance.  Cardiovascular:     Rate and Rhythm: Normal rate and regular rhythm.     Heart sounds: Normal heart sounds.  Pulmonary:     Effort: Pulmonary effort is normal.     Breath sounds: Normal breath sounds.  Musculoskeletal:     Right lower leg: Edema (1+) present.     Left lower leg: Edema (2+and weeping) present.  Skin:    General: Skin is warm.     Findings: Erythema (bil lower ext) present.  Neurological:     Mental Status: She is alert.  Psychiatric:        Mood and Affect: Mood normal.        Behavior: Behavior normal.    BP 108/70   Pulse 71   Temp 97.6 F (36.4 C) (Temporal)   Resp 20   Ht 5\' 5"  (1.651 m)   Wt 232 lb (105.2 kg)   SpO2 95%   BMI 38.61 kg/m        Assessment & Plan:   Nile Dorning in today with chief complaint of Bilateral leg swelling (Low grade fever last night. Hands itching and burning too)   1.  Pruritus Benadryl OTC - methylPREDNISolone acetate (DEPO-MEDROL) injection 80 mg - predniSONE (DELTASONE) 20 MG tablet; Take 2 tablets (40 mg total) by mouth daily with breakfast for 5 days. 2 po daily for 5 days  Dispense: 10 tablet; Refill: 0  2. Venous stasis of lower extremity Elevate legs when sitting - Apply unna boot  3. Cellulitis of lower extremity, unspecified laterality Follow up thursday - clindamycin (CLEOCIN) 300 MG capsule; Take 1 capsule (300 mg total) by mouth 3 (three) times daily.  Dispense: 30 capsule; Refill: 0    The above assessment and management plan was discussed with the patient. The patient verbalized understanding of and has agreed to the management plan. Patient is aware to call the clinic if symptoms persist or worsen. Patient is aware when to return to the clinic for a follow-up visit. Patient educated on when it is appropriate to go to the emergency department.   [name] Tuesday, FNP

## 2021-06-20 ENCOUNTER — Other Ambulatory Visit: Payer: Self-pay

## 2021-06-20 ENCOUNTER — Ambulatory Visit: Payer: No Typology Code available for payment source | Admitting: Nurse Practitioner

## 2021-06-20 ENCOUNTER — Encounter: Payer: Self-pay | Admitting: Nurse Practitioner

## 2021-06-20 VITALS — BP 134/75 | HR 66 | Temp 97.8°F | Resp 20 | Ht 65.0 in | Wt 232.0 lb

## 2021-06-20 DIAGNOSIS — L03116 Cellulitis of left lower limb: Secondary | ICD-10-CM | POA: Diagnosis not present

## 2021-06-20 NOTE — Progress Notes (Signed)
   Subjective:    Patient ID: Sydney [name], female    DOB: 03-13-1961, 60 y.o.   MRN: [id number]   Chief Complaint: recheck cellulitis  HPI Patient was seen on 06/18/21 with lower extremity venous statis with cellulitis. Unna boots were applied and she was given clindamycin. She is here today to have unna boots remved and legs rechecked.     Review of Systems  Constitutional:  Negative for diaphoresis.  Eyes:  Negative for pain.  Respiratory:  Negative for shortness of breath.   Cardiovascular:  Negative for chest pain, palpitations and leg swelling.  Gastrointestinal:  Negative for abdominal pain.  Endocrine: Negative for polydipsia.  Skin:  Negative for rash.  Neurological:  Negative for dizziness, weakness and headaches.  Hematological:  Does not bruise/bleed easily.  All other systems reviewed and are negative.     Objective:   Physical Exam Vitals reviewed.  Constitutional:      Appearance: Normal appearance.  Cardiovascular:     Rate and Rhythm: Normal rate and regular rhythm.     Heart sounds: Normal heart sounds.  Pulmonary:     Breath sounds: Normal breath sounds.  Skin:    General: Skin is warm.     Comments: Mild erythema of left lower ext. Left leg very scaley with possible skin break down on medial side of ankle.  Neurological:     General: No focal deficit present.     Mental Status: She is alert and oriented to person, place, and time.  Psychiatric:        Mood and Affect: Mood normal.        Behavior: Behavior normal.    BP 134/75   Pulse 66   Temp 97.8 F (36.6 C) (Temporal)   Resp 20   Ht 5\' 5"  (1.651 m)   Wt 232 lb (105.2 kg)   SpO2 93%   BMI 38.61 kg/m        Assessment & Plan:   Sydney [name] in today with chief complaint of No chief complaint on file.   1. Cellulitis of left lower extremity Finish antibiotic Elevate legs when sitting Warm epsom salt soak to help get rid f flakey skin Follow up prn    The above assessment  and management plan was discussed with the patient. The patient verbalized understanding of and has agreed to the management plan. Patient is aware to call the clinic if symptoms persist or worsen. Patient is aware when to return to the clinic for a follow-up visit. Patient educated on when it is appropriate to go to the emergency department.   [name] [name], FNP

## 2021-06-25 ENCOUNTER — Ambulatory Visit: Payer: No Typology Code available for payment source | Admitting: Nurse Practitioner

## 2021-06-25 ENCOUNTER — Encounter: Payer: Self-pay | Admitting: Nurse Practitioner

## 2021-06-25 VITALS — BP 133/84 | HR 65 | Temp 97.9°F | Resp 20 | Ht 65.0 in | Wt 230.0 lb

## 2021-06-25 DIAGNOSIS — T50905A Adverse effect of unspecified drugs, medicaments and biological substances, initial encounter: Secondary | ICD-10-CM

## 2021-06-25 DIAGNOSIS — L500 Allergic urticaria: Secondary | ICD-10-CM

## 2021-06-25 MED ORDER — METHYLPREDNISOLONE ACETATE 80 MG/ML IJ SUSP
80.0000 mg | Freq: Once | INTRAMUSCULAR | Status: AC
  Administered 2021-06-25: 80 mg via INTRAMUSCULAR

## 2021-06-25 NOTE — Patient Instructions (Signed)
Drug Allergy A drug allergy is when your body reacts in a bad way to a medicine. The reaction may be mild or very bad. In some cases, it can be life-threatening. If you have an allergic reaction, get help right away. You should get help even if the reaction seems mild. What are the causes? This condition is caused by a reaction in your body's defense system. The system sees a medicine as being harmful when it is not. What are the signs or symptoms? Symptoms of a mild reaction A stuffy nose. Tingling in your mouth. An itchy, red rash. Symptoms of a very bad reaction Swelling of your eyes, lips, face, tongue, mouth or back of your throat. Itchy, red, swollen areas of skin. Feeling dizzy or light-headed. Feeling mixed up. Pain in your belly. Trouble with breathing, talking, or swallowing. A tight feeling in your chest. Fast heartbeat. Vomiting or watery poop (diarrhea). How is this treated? There is no cure for allergies. An allergic reaction can be treated with: Medicines to help your symptoms. Medicines that you breathe into your lungs (respiratory inhalers). A shot for a very bad allergic reaction (epinephrine). For a very bad reaction, you may need to stay in the hospital. Your doctor may teach you how to use an allergy kit and how to give yourself an allergy shot. You can give yourself an allergy shot with what is called an auto-injector "pen." Follow these instructions at home: If you have a very bad allergy:  Always keep an allergy pen or your kit with you. This could save your life. Use it as told by your doctor. Make sure that you, the people who live with you, and your employer know how to use your allergy pen or kit. If you used your allergy pen or kit: Get more medicine for it right away. This is important in case you have another reaction. Get help right away. Wear a medical alert bracelet or necklace that says you have an allergy, if your doctor tells you to do  this. General instructions Avoid medicines that you are allergic to. Take over-the-counter and prescription medicines only as told by your doctor. If you were given allergy medicines, do not drive until your health care provider tells you it is safe. If you have hives or a rash: Use over-the-counter medicines as told by your doctor. Put cold, wet cloths on your skin. Take baths or showers in cool water. Avoid hot water. It is up to you to get your test results. Ask how to get your results when they are ready. Tell all your doctors that you have a medicine allergy. Keep all follow-up visits. Contact a doctor if: You think that you are having a mild allergic reaction. You have symptoms that last more than 2 days after your reaction. You get new symptoms. Get help right away if: You had to use your allergy pen or kit. You must go to the emergency room, even if the medicine seems to be working. Your symptoms get worse. You have symptoms of a very bad allergic reaction. These symptoms may be an emergency. Use your allergy pen or kit as you have been told. Get medical help right away. Call your local emergency services (911 in the [place] Do not wait to see if the symptoms will go away. Do not drive yourself to the hospital. Summary A drug allergy is when your body reacts in a bad way to a medicine. Take medicines only as told by your doctor.  Tell all your doctors that you have a medicine allergy. Always keep an allergy pen or kit with you if you have a very bad allergy. This information is not intended to replace advice given to you by your health care provider. Make sure you discuss any questions you have with your health care provider. Document Revised: 12/17/2020 Document Reviewed: 12/17/2020 Elsevier Patient Education  [employer].

## 2021-06-25 NOTE — Progress Notes (Signed)
   Subjective:    Patient ID: Sydney [name], female    DOB: April 23, 1961, 60 y.o.   MRN: [id number]   Chief Complaint: Rash all over and Tonsils feel swollen (Patient was taking Clindamycin/)   HPI Patient comes in c./o rash. She has been on clindamycin for cellulitis of lower ext. She has taken it in the past without any problems. She has lots of medication allergies. She has been taking benadryl for the itching. Her celluliis is better but she says her throat feels swollen. Rash is really itching and is all over.    Review of Systems  Constitutional:  Negative for diaphoresis.  Eyes:  Negative for pain.  Respiratory:  Negative for shortness of breath.   Cardiovascular:  Negative for chest pain, palpitations and leg swelling.  Gastrointestinal:  Negative for abdominal pain.  Endocrine: Negative for polydipsia.  Skin:  Positive for rash (all over).  Neurological:  Negative for dizziness, weakness and headaches.  Hematological:  Does not bruise/bleed easily.  All other systems reviewed and are negative.     Objective:   Physical Exam Vitals and nursing note reviewed.  Constitutional:      Appearance: Normal appearance.  HENT:     Nose: Nose normal.     Mouth/Throat:     Mouth: Mucous membranes are moist.  Cardiovascular:     Rate and Rhythm: Normal rate and regular rhythm.     Heart sounds: Normal heart sounds.  Pulmonary:     Effort: Pulmonary effort is normal.     Breath sounds: Normal breath sounds.  Musculoskeletal:     Right lower leg: No edema.     Left lower leg: No edema.  Skin:    General: Skin is warm.     Findings: Rash (erythematous fine rash all over body except face) present.     Comments: Erythema of bil lower ext.  Neurological:     General: No focal deficit present.     Mental Status: She is alert and oriented to person, place, and time.    BP 133/84   Pulse 65   Temp 97.9 F (36.6 C) (Temporal)   Resp 20   Ht 5\' 5"  (1.651 m)   Wt 230 lb (104.3  kg)   SpO2 96%   BMI 38.27 kg/m        Assessment & Plan:  Sydney [name] in today with chief complaint of Rash all over and Tonsils feel swollen (Patient was taking Clindamycin/)   1. Urticaria due to drug allergy Continue benadryl OTC Avoid scratching Clindamycin added to allergy list. - methylPREDNISolone acetate (DEPO-MEDROL) injection 80 mg RTO prn   The above assessment and management plan was discussed with the patient. The patient verbalized understanding of and has agreed to the management plan. Patient is aware to call the clinic if symptoms persist or worsen. Patient is aware when to return to the clinic for a follow-up visit. Patient educated on when it is appropriate to go to the emergency department.   [name] [name], FNP

## 2021-06-28 ENCOUNTER — Other Ambulatory Visit: Payer: Self-pay | Admitting: Family Medicine

## 2021-06-28 ENCOUNTER — Other Ambulatory Visit: Payer: Self-pay | Admitting: Nurse Practitioner

## 2021-06-28 DIAGNOSIS — F5104 Psychophysiologic insomnia: Secondary | ICD-10-CM

## 2021-06-28 MED ORDER — HYDROXYZINE HCL 10 MG PO TABS: 10.0000 mg | ORAL_TABLET | Freq: Three times a day (TID) | ORAL | 0 refills | Status: DC | PRN

## 2021-06-28 NOTE — Progress Notes (Signed)
Sent in prescription for atarax for itching- [facility] [place]

## 2021-07-01 ENCOUNTER — Other Ambulatory Visit: Payer: Self-pay

## 2021-07-01 DIAGNOSIS — I872 Venous insufficiency (chronic) (peripheral): Secondary | ICD-10-CM

## 2021-07-02 ENCOUNTER — Encounter (HOSPITAL_COMMUNITY): Payer: Self-pay

## 2021-07-02 ENCOUNTER — Ambulatory Visit: Payer: No Typology Code available for payment source | Admitting: Vascular Surgery

## 2021-07-02 ENCOUNTER — Encounter: Payer: Self-pay | Admitting: Vascular Surgery

## 2021-07-02 ENCOUNTER — Encounter (HOSPITAL_COMMUNITY): Payer: No Typology Code available for payment source

## 2021-07-02 ENCOUNTER — Other Ambulatory Visit: Payer: Self-pay

## 2021-07-02 ENCOUNTER — Ambulatory Visit (HOSPITAL_COMMUNITY)
Admission: RE | Admit: 2021-07-02 | Discharge: 2021-07-02 | Disposition: A | Discharge status: Home / Self Care | Payer: No Typology Code available for payment source | Source: Ambulatory Visit | Attending: Vascular Surgery | Admitting: Vascular Surgery

## 2021-07-02 VITALS — BP 122/75 | HR 75 | Temp 98.0°F | Resp 16 | Ht 65.0 in | Wt 224.0 lb

## 2021-07-02 DIAGNOSIS — I872 Venous insufficiency (chronic) (peripheral): Secondary | ICD-10-CM | POA: Diagnosis not present

## 2021-07-02 NOTE — Progress Notes (Signed)
Patient name: Sydney [name] MRN: [id number] DOB: 05/02/1961 Sex: female  REASON FOR CONSULT: Evaluate venous insufficiency with left leg cellulitis  HPI: Tysheka Fanguy is a 60 y.o. female, with history of hypertension that presents for evaluation of lower extremity venous insufficiency.  She describes multiple bouts of cellulitis in the left lower extremity since January 2022.  She has been on multiple rounds of antibiotics.  She also has very prominent leg swelling more so in the left leg than the right leg.  She has been wearing her husband's compression stockings.  No known history of DVT.  She has no pain in her legs when she walks.  No pain in the feet at night.  Past Medical History:  Diagnosis Date   Allergy    Anemia    history of   Anxiety    Arthritis    Asthma    Atherosclerosis    Depression    Family history of adverse reaction to anesthesia    pt's mother felt everything and couldn't speak during a gallbladder surgery   GERD (gastroesophageal reflux disease)    History of hiatal hernia    History of kidney stones    Hypertension    Pneumonia    a few times   TIA (transient ischemic attack) 2012    Past Surgical History:  Procedure Laterality Date   ABDOMINAL HYSTERECTOMY     abdominal   APPENDECTOMY     bladder tack     CARPAL TUNNEL RELEASE Bilateral    CERVICAL SPINE SURGERY     5-6, 6-7   CHOLECYSTECTOMY     COLONOSCOPY WITH PROPOFOL N/A 07/10/2020   Procedure: COLONOSCOPY WITH PROPOFOL;  Surgeon: Lanelle Bal, DO;  Location: [facility];  Service: Endoscopy;  Laterality: N/A;  12:15pm   HAMMER TOE SURGERY Right    JOINT REPLACEMENT     TOTAL HIP 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   Procedure: LEFT TOTAL HIP ARTHROPLASTY -DIRECT ANTERIOR;  Surgeon: Eldred Manges, MD;  Location: [facility] OR;  Service: Orthopedics;  Laterality: Left;   TOTAL HIP ARTHROPLASTY Right 12/05/2019   Procedure: RIGHT TOTAL HIP ARTHROPLASTY ANTERIOR APPROACH  DIRECT ANTERIOR;   Surgeon: Eldred Manges, MD;  Location: [facility];  Service: Orthopedics;  Laterality: Right;    Family History  Problem Relation Age of Onset   Anxiety disorder Mother    Depression Mother    Heart disease Mother    Hypertension Mother    Arrhythmia Mother    Cancer Father        colon, age greater than 88   Lung cancer Father    Migraines Sister    Cancer Daughter        nerve sheath sarcoma   Alcohol abuse Brother    Breast cancer Neg Hx     SOCIAL HISTORY: Social History   Socioeconomic History   Marital status: Married    Spouse name: Not on file   Number of children: 3   Years of education: Not on file   Highest education level: Not on file  Occupational History   Not on file  Tobacco Use   Smoking status: Every Day    Packs/day: 0.50    Years: 23.00    Pack years: 11.50    Types: Cigarettes   Smokeless tobacco: Never  Vaping Use   Vaping Use: Never used  Substance and Sexual Activity   Alcohol use: Yes    Comment: occ   Drug use: Never  Sexual activity: Not Currently  Other Topics Concern   Not on file  Social History Narrative   Recently relocated to [place] from the [place].  She resides at home with her husband.   She has 3 children but 1 passed away from cancer.   Social Determinants of Health   Financial Resource Strain: Not on file  Food Insecurity: Not on file  Transportation Needs: Not on file  Physical Activity: Not on file  Stress: Not on file  Social Connections: Not on file  Intimate Partner Violence: Not on file    Allergies  Allergen Reactions   Bee Pollen Anaphylaxis and Swelling   Bee Venom Anaphylaxis and Swelling   Butorphanol Other (See Comments)    Not sure told by md she was allergic after surgery.    Meloxicam Anxiety    Mood disorder Altered her personality    Penicillins Anaphylaxis and Hives    Unable to Recall As child mom was told she is highly allergic Did it involve swelling of the face/tongue/throat, SOB,  or low BP? Unknown Did it involve sudden or severe rash/hives, skin peeling, or any reaction on the inside of your mouth or nose? Unknown Did you need to seek medical attention at a hospital or doctor's office? Unknown When did it last happen?      childhood allergy If all above answers are NO, may proceed with cephalosporin use.    Prochlorperazine Edisylate Anaphylaxis   Sulfa Antibiotics Anaphylaxis    Unable to Recall   Tramadol Anxiety    Didn't like the way it made her feel    Clindamycin Hives   Levaquin [Levofloxacin] Itching   Topiramate Nausea And Vomiting   Doxycycline Dermatitis, Hives, Itching, Photosensitivity and Rash    Current Outpatient Medications  Medication Sig Dispense Refill   albuterol (VENTOLIN HFA) 108 (90 Base) MCG/ACT inhaler Inhale 2 puffs into the lungs every 6 (six) hours as needed for wheezing or shortness of breath.     amLODipine (NORVASC) 5 MG tablet Take 1 tablet (5 mg total) by mouth daily. 90 tablet 2   augmented betamethasone dipropionate (DIPROLENE-AF) 0.05 % cream Apply topically.     cetirizine (ZYRTEC) 10 MG tablet Take 10 mg by mouth daily.     esomeprazole (NEXIUM) 40 MG capsule Take 1 capsule (40 mg total) by mouth daily. 30 capsule 3   furosemide (LASIX) 20 MG tablet Take 1 tablet (20 mg total) by mouth daily. 30 tablet 3   HYDROcodone-acetaminophen (NORCO/VICODIN) 5-325 MG tablet Take 1 tablet by mouth every 4 (four) hours as needed for severe pain. 30 tablet 0   hydrOXYzine (ATARAX) 10 MG tablet Take 1 tablet (10 mg total) by mouth 3 (three) times daily as needed. 30 tablet 0   metoprolol succinate (TOPROL-XL) 25 MG 24 hr tablet Take 1 tablet (25 mg total) by mouth daily. 90 tablet 2   mirabegron ER (MYRBETRIQ) 50 MG TB24 tablet Take 1 tablet (50 mg total) by mouth daily. 28 tablet 0   rosuvastatin (CRESTOR) 10 MG tablet Take 10 mg by mouth every other day.     sertraline (ZOLOFT) 100 MG tablet Take 1.5 tablets (150 mg total) by mouth  daily. 90 tablet 3   traZODone (DESYREL) 100 MG tablet TAKE 1 TO 2 TABLETS AT     BEDTIME AS NEEDED FOR SLEEP 180 tablet 1   No current facility-administered medications for this visit.    REVIEW OF SYSTEMS:   denotes positive finding,   denotes negative finding Cardiac  Comments:  Chest pain or chest pressure:    Shortness of breath upon exertion:    Short of breath when lying flat:    Irregular heart rhythm:        Vascular    Pain in calf, thigh, or hip brought on by ambulation:    Pain in feet at night that wakes you up from your sleep:     Blood clot in your veins:    Leg swelling:  x Left      Pulmonary    Oxygen at home:    Productive cough:     Wheezing:         Neurologic    Sudden weakness in arms or legs:     Sudden numbness in arms or legs:     Sudden onset of difficulty speaking or slurred speech:    Temporary loss of vision in one eye:     Problems with dizziness:         Gastrointestinal    Blood in stool:     Vomited blood:         Genitourinary    Burning when urinating:     Blood in urine:        Psychiatric    Major depression:         Hematologic    Bleeding problems:    Problems with blood clotting too easily:        Skin    Rashes or ulcers:        Constitutional    Fever or chills:      PHYSICAL EXAM: Vitals:   07/02/21 1201  BP: 122/75  Pulse: 75  Resp: 16  Temp: 98 F (36.7 C)  TempSrc: Temporal  SpO2: 95%  Weight: 224 lb (101.6 kg)  Height: 5\' 5"  (1.651 m)    GENERAL: The patient is a well-nourished female, in no acute distress. The vital signs are documented above. CARDIAC: There is a regular rate and rhythm.  VASCULAR:  Bilateral femoral pulses palpable Right PT palpable Left PT and DP brisk by Doppler and sounds multiphasic Left leg does have some edema and pigmentation on the medial shin PULMONARY: No respiratory distress. ABDOMEN: Soft and non-tender MUSCULOSKELETAL: There are no major deformities or  cyanosis. NEUROLOGIC: No focal weakness or paresthesias are detected. SKIN: There are no ulcers or rashes noted. PSYCHIATRIC: The patient has a normal affect.  DATA:   Lower Venous Reflux Study   Patient Name:  TELISSA PALMISANO  Date of Exam:   07/02/2021  Medical Rec #: 07/04/2021       Accession #:    [id number]  Date of Birth: [birth year]        Patient Gender: F  Patient Age:   63 years  Exam Location:  [facility]  Procedure:      VAS Rudene Anda LOWER EXTREMITY VENOUS REFLUX  Referring Phys: [place]    ---------------------------------------------------------------------------  -----     Indications: Swelling, Edema, and varicosities.     Performing Technologist: Sherald Hess RVT      Examination Guidelines: A complete evaluation includes B-mode imaging,  spectral  Doppler, color Doppler, and power Doppler as needed of all accessible  portions  of each vessel. Bilateral testing is considered an integral part of a  complete  examination. Limited examinations for reoccurring indications may be  performed  as noted. The reflux portion of the exam is performed with the patient in  reverse Trendelenburg.  Significant venous reflux is defined as >500 ms in the superficial venous  system, and >1 second in the deep venous system.      Venous Reflux Times  +--------------+---------+------+-----------+------------+--------+   RIGHT          Reflux No Reflux Reflux Time Diameter cms Comments                              Yes                                       +--------------+---------+------+-----------+------------+--------+   CFV            no                                                   +--------------+---------+------+-----------+------------+--------+   FV prox        no                                                   +--------------+---------+------+-----------+------------+--------+   FV mid         no                                                    +--------------+---------+------+-----------+------------+--------+   FV dist        no                                                   +--------------+---------+------+-----------+------------+--------+   Popliteal      no                                                   +--------------+---------+------+-----------+------------+--------+   GSV at SFJ                yes     >500 ms      0.554                +--------------+---------+------+-----------+------------+--------+   GSV prox thigh            yes     >500 ms      0.483                +--------------+---------+------+-----------+------------+--------+   GSV mid thigh             yes     >500 ms      0.548                +--------------+---------+------+-----------+------------+--------+   GSV dist thigh            yes     >  500 ms      0.548                +--------------+---------+------+-----------+------------+--------+   GSV at knee    no                              0.298                +--------------+---------+------+-----------+------------+--------+   GSV prox calf                                  0.256                +--------------+---------+------+-----------+------------+--------+   GSV mid calf                                   0.444                +--------------+---------+------+-----------+------------+--------+   SSV Pop Fossa  no                              0.511                +--------------+---------+------+-----------+------------+--------+   SSV prox calf  no                              0.317                +--------------+---------+------+-----------+------------+--------+   SSV mid calf                                   0.343                +--------------+---------+------+-----------+------------+--------+      +--------------+---------+------+-----------+------------+--------+   LEFT           Reflux No Reflux Reflux Time Diameter cms Comments                              Yes                                        +--------------+---------+------+-----------+------------+--------+   CFV                       yes    >1 second                          +--------------+---------+------+-----------+------------+--------+   FV prox                   yes    >1 second                          +--------------+---------+------+-----------+------------+--------+   FV mid                    yes    >1 second                          +--------------+---------+------+-----------+------------+--------+  FV dist                   yes    >1 second                          +--------------+---------+------+-----------+------------+--------+   Popliteal      no                                                   +--------------+---------+------+-----------+------------+--------+   GSV at SFJ                yes     >500 ms      0.774                +--------------+---------+------+-----------+------------+--------+   GSV prox thigh            yes     >500 ms      0.456                +--------------+---------+------+-----------+------------+--------+   GSV mid thigh             yes     >500 ms      0.523                +--------------+---------+------+-----------+------------+--------+   GSV dist thigh            yes     >500 ms      0.407                +--------------+---------+------+-----------+------------+--------+   GSV at knee               yes     >500 ms      0.450                +--------------+---------+------+-----------+------------+--------+   GSV prox calf                                  0.511                +--------------+---------+------+-----------+------------+--------+   GSV mid calf                                   0.469                +--------------+---------+------+-----------+------------+--------+   SSV Pop Fossa             yes     >500 ms      0.327                +--------------+---------+------+-----------+------------+--------+   SSV prox calf             yes      >500 ms      0.568                +--------------+---------+------+-----------+------------+--------+   SSV mid calf                                   0.573                +--------------+---------+------+-----------+------------+--------+  Summary:  Bilateral:  - No evidence of deep vein thrombosis seen in the lower extremities,  bilaterally, from the common femoral through the popliteal veins.  - No evidence of superficial venous thrombosis in the lower extremities,  bilaterally.     Right:  - No evidence of superficial venous reflux seen in the right short  saphenous vein.  - Venous reflux is noted in the right sapheno-femoral junction.  - Venous reflux is noted in the right greater saphenous vein in the thigh.     Left:  - Venous reflux is noted in the left common femoral vein.  - Venous reflux is noted in the left sapheno-femoral junction.  - Venous reflux is noted in the left greater saphenous vein in the thigh.  - Venous reflux is noted in the left short saphenous vein.   Assessment/Plan:  60 year old female presents for evaluation of left lower extremity cellulitis with leg swelling and concern for underlying venous insufficiency.  I discussed that given her symptoms of leg swelling with skin changes this would be consistent with venous insufficiency.  Her reflux study shows significant evidence of venous insufficiency in the left leg in both the deep and superficial system.  I recommended conservative measures with leg elevation, compression, exercise, and weight loss.  We got her sized for knee-high compression stockings today.  I will have her come back in 3 months to see one of my partners for evaluation of possible great saphenous vein ablation.   Cephus Shelling, MD [facility] Office: [phone]

## 2021-07-18 ENCOUNTER — Telehealth: Payer: Self-pay | Admitting: Radiology

## 2021-07-18 ENCOUNTER — Other Ambulatory Visit: Payer: Self-pay

## 2021-07-18 ENCOUNTER — Ambulatory Visit: Payer: No Typology Code available for payment source | Admitting: Orthopaedic Surgery

## 2021-07-18 DIAGNOSIS — M25511 Pain in right shoulder: Secondary | ICD-10-CM

## 2021-07-18 NOTE — Telephone Encounter (Signed)
FYI    Patient called [facility] office stating that her right shoulder continues to bother her and that she was told to call us if no better and we would order MRI.    Per last office note, if no significant relief from injection, she will need MRI to evaluate for rotator cuff tear. MRI order entered.

## 2021-07-22 ENCOUNTER — Encounter: Payer: Self-pay | Admitting: Surgery

## 2021-07-24 ENCOUNTER — Encounter: Payer: Self-pay | Admitting: Family Medicine

## 2021-07-25 ENCOUNTER — Encounter: Payer: Self-pay | Admitting: Family Medicine

## 2021-07-25 ENCOUNTER — Ambulatory Visit: Payer: No Typology Code available for payment source | Admitting: Family Medicine

## 2021-07-25 VITALS — BP 122/77 | HR 66 | Ht 65.0 in | Wt 233.0 lb

## 2021-07-25 DIAGNOSIS — I872 Venous insufficiency (chronic) (peripheral): Secondary | ICD-10-CM

## 2021-07-25 DIAGNOSIS — L03116 Cellulitis of left lower limb: Secondary | ICD-10-CM

## 2021-07-25 MED ORDER — CEPHALEXIN 500 MG PO CAPS: 500.0000 mg | ORAL_CAPSULE | Freq: Four times a day (QID) | ORAL | 0 refills | Status: DC

## 2021-07-25 NOTE — Progress Notes (Signed)
BP 122/77    Pulse 66    Ht 5\' 5"  (1.651 m)    Wt 233 lb (105.7 kg)    SpO2 96%    BMI 38.77 kg/m    Subjective:   Patient ID: Sydney [name], female    DOB: 05/27/1961, 61 y.o.   MRN: [id number]  HPI: Sydney [name] is a 61 y.o. female presenting on 07/25/2021 for Edema (LE and red)   HPI Leg swelling and redness of left leg.  This started 1 year ago and has not gone away since.  She says she had covid 1 year ago and has this since.  Heat and activity make it worse. She has used betamethasone and Cerave and ACE bandages at night.  She has a rash that is pruritic since them.  She says it has not gone away since then.  She has had multiple antibiotics in this past year and has seen vein and vascular.  She had been fighting this off and on over the past year but she did say the redness and rash mostly went away but has come back over the past week and a half.  She says the redness and warmth has been spreading even more just since the past 2 days.  Relevant past medical, surgical, family and social history reviewed and updated as indicated. Interim medical history since our last visit reviewed. Allergies and medications reviewed and updated.  Review of Systems  Constitutional:  Negative for chills and fever.  Respiratory:  Negative for chest tightness and shortness of breath.   Cardiovascular:  Positive for leg swelling. Negative for chest pain.  Skin:  Positive for color change and rash.  Neurological:  Negative for light-headedness and headaches.  Psychiatric/Behavioral:  Negative for agitation and behavioral problems.   All other systems reviewed and are negative.  Per HPI unless specifically indicated above   Allergies as of 07/25/2021       Reactions   Bee Pollen Anaphylaxis, Swelling   Bee Venom Anaphylaxis, Swelling   Butorphanol Other (See Comments)   Not sure told by md she was allergic after surgery.   Meloxicam Anxiety   Mood disorder Altered her personality    Penicillins Anaphylaxis, Hives   Unable to Recall As child mom was told she is highly allergic Did it involve swelling of the face/tongue/throat, SOB, or low BP? Unknown Did it involve sudden or severe rash/hives, skin peeling, or any reaction on the inside of your mouth or nose? Unknown Did you need to seek medical attention at a hospital or doctor's office? Unknown When did it last happen?      childhood allergy If all above answers are NO, may proceed with cephalosporin use.   Prochlorperazine Edisylate Anaphylaxis   Sulfa Antibiotics Anaphylaxis   Unable to Recall   Tramadol Anxiety   Didn't like the way it made her feel   Clindamycin Hives   Levaquin [levofloxacin] Itching   Topiramate Nausea And Vomiting   Doxycycline Dermatitis, Hives, Itching, Photosensitivity, Rash        Medication List        Accurate as of July 25, 2021  3:16 PM. If you have any questions, ask your nurse or doctor.          STOP taking these medications    mirabegron ER 50 MG Tb24 tablet Commonly known as: MYRBETRIQ Stopped by: July 27, 2021 Clarann Helvey, MD       TAKE these medications    albuterol 108 (90 Base)  MCG/ACT inhaler Commonly known as: VENTOLIN HFA Inhale 2 puffs into the lungs every 6 (six) hours as needed for wheezing or shortness of breath.   amLODipine 5 MG tablet Commonly known as: NORVASC Take 1 tablet (5 mg total) by mouth daily.   augmented betamethasone dipropionate 0.05 % cream Commonly known as: DIPROLENE-AF Apply topically.   cephALEXin 500 MG capsule Commonly known as: KEFLEX Take 1 capsule (500 mg total) by mouth 4 (four) times daily. Started by: Nils Pyle, MD   cetirizine 10 MG tablet Commonly known as: ZYRTEC Take 10 mg by mouth daily.   esomeprazole 40 MG capsule Commonly known as: NexIUM Take 1 capsule (40 mg total) by mouth daily.   furosemide 20 MG tablet Commonly known as: LASIX Take 1 tablet (20 mg total) by mouth daily.    HYDROcodone-acetaminophen 5-325 MG tablet Commonly known as: NORCO/VICODIN Take 1 tablet by mouth every 4 (four) hours as needed for severe pain.   hydrOXYzine 10 MG tablet Commonly known as: ATARAX Take 1 tablet (10 mg total) by mouth 3 (three) times daily as needed.   metoprolol succinate 25 MG 24 hr tablet Commonly known as: TOPROL-XL Take 1 tablet (25 mg total) by mouth daily.   rosuvastatin 10 MG tablet Commonly known as: CRESTOR Take 10 mg by mouth every other day.   sertraline 100 MG tablet Commonly known as: ZOLOFT Take 1.5 tablets (150 mg total) by mouth daily.   traZODone 100 MG tablet Commonly known as: DESYREL TAKE 1 TO 2 TABLETS AT     BEDTIME AS NEEDED FOR SLEEP         Objective:   BP 122/77    Pulse 66    Ht 5\' 5"  (1.651 m)    Wt 233 lb (105.7 kg)    SpO2 96%    BMI 38.77 kg/m   Wt Readings from Last 3 Encounters:  07/25/21 233 lb (105.7 kg)  07/02/21 224 lb (101.6 kg)  06/25/21 230 lb (104.3 kg)    Physical Exam Vitals and nursing note reviewed.  Constitutional:      General: She is not in acute distress.    Appearance: Normal appearance. She is not ill-appearing.  Skin:    Findings: Erythema (Erythema extends up from her ankle along her shin almost up to her knee.) and rash (Scaly patchy rash on her left ankle and anterior left lower leg) present.  Neurological:     Mental Status: She is alert.      Assessment & Plan:   Problem List Items Addressed This Visit   None Visit Diagnoses     Left leg cellulitis    -  Primary   Relevant Medications   cephALEXin (KEFLEX) 500 MG capsule   Venous stasis dermatitis of left lower extremity       Relevant Medications   cephALEXin (KEFLEX) 500 MG capsule       Nurse to apply Unna boot, patient to remove in 5 to 6 days  Patient has allergies to many antibiotics but has never had a reaction to cephalosporins and has taken Keflex in the past.  Reaction to penicillins was when she was a child  and unknown for sure Follow up plan: Return if symptoms worsen or fail to improve.  Counseling provided for all of the vaccine components No orders of the defined types were placed in this encounter.   14/06/22, MD [facility] 07/25/2021, 3:16 PM

## 2021-07-25 NOTE — Telephone Encounter (Signed)
I spoke with patient and appointment given for today with [facility].

## 2021-07-25 NOTE — Progress Notes (Signed)
HPI Sydney [name] is a 60 y.o. female who presents with pain, redness, itching, and swelling on the left lower leg. She states the rash began 1 year ago when she had COVID and has waxed and waned since then, however it has never completely abated. She says flair ups are caused by prolonged activity and heat. She medicates the rash with betamethasone cream as well as with lotion and ace bandages at night. She states that activity and heat make her symptoms worse during flair ups. In the past year she has been prescribed multiple antibiotics, however she was allergic to several of them and none were effective enough to cause her symptoms to go away entirely. She denies any cough, sob, chest pain/tightness. She admits to "feeling dry".  Physical Exam Vitals: BP - 122/77, HR - 66 O2 - 96% Wt - 105.7 kg Ht - 5'5".  Skin: Her left leg was swollen and red from the distal part of her foot to just distal to the knee. Leg was warm to the touch and painful on palpation.  Respiratory: Lungs were clear to auscultation.  Cardiac: Auscultation of heart found regular rate and rhythm with no murmurs.  A&P Cellulitis with venous stasis dermatitis is at the top of the ddx followed by vascular dermatitis and vasculitis. Another possibility is long term COVID rash however this seems unlikely given the length of time.  Prescribed cephalexin 500 mg capsule by mouth 4 times daily, as well as administered a unna boot to leg to be removed in 1 week. Ashley was instructed to call back if symptoms persisted after treatment.  Patient seen and examined with PA student Jetta Lout, agree with assessment and plan above. Arville Care, MD [facility] 07/26/2021, 7:56 AM

## 2021-08-01 ENCOUNTER — Other Ambulatory Visit: Payer: Self-pay

## 2021-08-01 ENCOUNTER — Ambulatory Visit: Payer: No Typology Code available for payment source | Admitting: Orthopaedic Surgery

## 2021-08-05 ENCOUNTER — Ambulatory Visit (HOSPITAL_COMMUNITY)
Admission: RE | Admit: 2021-08-05 | Discharge: 2021-08-05 | Disposition: A | Discharge status: Home / Self Care | Payer: No Typology Code available for payment source | Source: Ambulatory Visit | Attending: Orthopaedic Surgery | Admitting: Orthopaedic Surgery

## 2021-08-05 ENCOUNTER — Other Ambulatory Visit: Payer: Self-pay

## 2021-08-05 DIAGNOSIS — M25511 Pain in right shoulder: Secondary | ICD-10-CM | POA: Diagnosis present

## 2021-08-09 ENCOUNTER — Telehealth: Payer: Self-pay | Admitting: Radiology

## 2021-08-09 NOTE — Telephone Encounter (Signed)
Please see message from [place] office below and advise.  Waiting on results of MRI  Please call her at [phone]

## 2021-08-09 NOTE — Telephone Encounter (Signed)
noted 

## 2021-08-22 ENCOUNTER — Ambulatory Visit (INDEPENDENT_AMBULATORY_CARE_PROVIDER_SITE_OTHER): Payer: No Typology Code available for payment source | Admitting: Orthopaedic Surgery

## 2021-08-22 ENCOUNTER — Other Ambulatory Visit: Payer: Self-pay

## 2021-08-22 ENCOUNTER — Encounter: Payer: Self-pay | Admitting: Orthopaedic Surgery

## 2021-08-22 VITALS — Ht 65.0 in | Wt 233.0 lb

## 2021-08-22 DIAGNOSIS — M7541 Impingement syndrome of right shoulder: Secondary | ICD-10-CM

## 2021-08-22 MED ORDER — LIDOCAINE HCL 1 % IJ SOLN
0.5000 mL | INTRAMUSCULAR | Status: AC | PRN
  Administered 2021-08-22: .5 mL

## 2021-08-22 MED ORDER — METHYLPREDNISOLONE ACETATE 40 MG/ML IJ SUSP
40.0000 mg | INTRAMUSCULAR | Status: AC | PRN
  Administered 2021-08-22: 40 mg via INTRA_ARTICULAR

## 2021-08-22 MED ORDER — BUPIVACAINE HCL 0.25 % IJ SOLN
4.0000 mL | INTRAMUSCULAR | Status: AC | PRN
  Administered 2021-08-22: 4 mL via INTRA_ARTICULAR

## 2021-08-22 NOTE — Progress Notes (Signed)
Office Visit Note   Patient: Sydney [name]           Date of Birth: 06/23/1961           MRN: [id number] Visit Date: 08/22/2021              Requested by: Janora Norlander, DO [place],  [place] [id number] PCP: Janora Norlander, DO   Assessment & Plan: Visit Diagnoses:  1. Impingement syndrome of right shoulder     Plan: Subacromial injection performed repeat from last year.  We reviewed MRI images and discussed report.  Is difficult determine if this is from subacromial bursitis with rotator cuff tendinopathy versus glenohumeral arthritis or any biceps tendon giving her more problems.  Subacromial injection performed.  We will check her back again in 3 weeks to consider trochanteric injection if she has persistent bursitis.  Follow-Up Instructions: No follow-ups on file.   Orders:  No orders of the defined types were placed in this encounter.  No orders of the defined types were placed in this encounter.     Procedures: Large Joint Inj: R subacromial bursa on 08/22/2021 11:17 AM Indications: pain Details: 22 G 1.5 in needle, lateral approach  Arthrogram: No  Medications: 4 mL bupivacaine 0.25 %; 40 mg methylPREDNISolone acetate 40 MG/ML; 0.5 mL lidocaine 1 % Outcome: tolerated well, no immediate complications Procedure, treatment alternatives, risks and benefits explained, specific risks discussed. Consent was given by the patient. Immediately prior to procedure a time out was called to verify the correct patient, procedure, equipment, support staff and site/side marked as required. Patient was prepped and draped in the usual sterile fashion.      Clinical Data: No additional findings.   Subjective: Chief Complaint  Patient presents with   Right Shoulder - Pain, Follow-up    MRI review    HPI 61 year old female returns post MRI scan of her shoulder.  Previous injection last fall gave her some relief.  MRI comparison to 2021 images show she now has  some long head biceps tendinopathy.  Persistent tendinosis in the rotator cuff and also stable moderate to severe glenohumeral cartilage wear.  She is also had total hip arthroplasty states that her trochanteric bursitis on the left has recurred.  She states shoulder is bothering her more than her hip.  Review of Systems all systems updated noncontributory unchanged.   Objective: Vital Signs: Ht 5\' 5"  (1.651 m)    Wt 233 lb (105.7 kg)    BMI 38.77 kg/m   Physical Exam Constitutional:      Appearance: She is well-developed.  HENT:     Head: Normocephalic.     Right Ear: External ear normal.     Left Ear: External ear normal. There is no impacted cerumen.  Eyes:     Pupils: Pupils are equal, round, and reactive to light.  Neck:     Thyroid: No thyromegaly.     Trachea: No tracheal deviation.  Cardiovascular:     Rate and Rhythm: Normal rate.  Pulmonary:     Effort: Pulmonary effort is normal.  Abdominal:     Palpations: Abdomen is soft.  Musculoskeletal:     Cervical back: No rigidity.  Skin:    General: Skin is warm and dry.  Neurological:     Mental Status: She is alert and oriented to person, place, and time.  Psychiatric:        Behavior: Behavior normal.    Ortho Exam positive  impingement right shoulder negative drop arm test but painful.  Long head of the biceps moderately tender.  She can reach behind posterior axillary line right shoulder.  Greater trochanter on the left is tender.  Leg lengths are equal.  Specialty Comments:  No specialty comments available.  Imaging: Narrative & Impression  CLINICAL DATA:  Right shoulder pain. Rotator cuff disorder suspected. Chronic.   EXAM: MRI OF THE RIGHT SHOULDER WITHOUT CONTRAST   TECHNIQUE: Multiplanar, multisequence MR imaging of the shoulder was performed. No intravenous contrast was administered.   COMPARISON:  Right shoulder radiographs 06/18/2021 MRI right shoulder 06/20/2020   FINDINGS: Rotator cuff:  Moderate anterior supraspinatus tendinosis. Linear horizontal fluid bright signal within the mid AP dimension of the supraspinatus tendon footprint measuring up to 4 mm in transverse dimension and 5 mm in AP dimension (coronal image 13, sagittal image 8), a small partial-thickness midsubstance tear without tendon retraction. Mild intermediate T2 signal tendinosis of the bursal aspect of the infraspinatus. Mild superior subscapularis tendinosis and interstitial partial-thickness tearing, similar to prior. The teres minor is intact.   Muscles: No rotator cuff muscle atrophy, fatty infiltration, or edema.   Biceps long head: Mild proximal long head of the biceps intermediate T2 signal tendinosis.   Acromioclavicular Joint: There are mild-to-moderate degenerative changes of the acromioclavicular joint including joint space narrowing, subchondral marrow edema, and peripheral osteophytosis. Type II acromion. Mild fluid within the subacromial/subdeltoid bursa, mildly increased from prior.   Glenohumeral Joint: Severe posterior glenoid cartilage thinning with mild-to-moderate subchondral marrow edema/cystic change. High-grade partial and full-thickness cartilage loss throughout the humeral head. Mild inferior humeral head-neck junction degenerative osteophytosis.   Labrum: High-grade attenuation and degenerative irregularity of the posterior glenoid labrum.   Bones:  No acute fracture.   Other: None.   IMPRESSION: 1. Moderate anterior supraspinatus tendinosis. Mid AP supraspinatus small horizontal linear midsubstance partial-thickness tear. No tendon retraction. 2. Mild superior subscapularis tendinosis and interstitial partial-thickness tearing, similar to prior. 3. Mild proximal long head of the biceps tendinosis. 4. Mild-to-moderate degenerative changes of the acromioclavicular joint. 5. Moderate to severe glenohumeral cartilage degenerative changes, similar to prior.      Electronically Signed   By: Yvonne Kendall M.D.   On: 08/05/2021 13:23     PMFS History: Patient Active Problem List   Diagnosis Date Noted   Urine frequency 05/10/2021   Cellulitis of left leg 01/18/2021   Rash 01/18/2021   History of bilateral hip arthroplasty 01/03/2021   Upper respiratory tract infection 12/30/2020   Non-recurrent acute serous otitis media of right ear 12/30/2020   Pharyngitis 10/29/2020   Closed fracture of multiple pubic rami, left, sequela 08/16/2020   H/O adenomatous polyp of colon 05/23/2020   FH: colon cancer in relative diagnosed at >56 years old 05/23/2020   Spinal stenosis of lumbar region 05/03/2020   Impingement syndrome of right shoulder 05/03/2020   H/O total hip arthroplasty 12/06/2019   Status post total replacement of left hip 09/15/2019   Back pain 07/01/2018   DDD (degenerative disc disease), lumbosacral 07/01/2018   Venous insufficiency of both lower extremities 11/17/2016   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 level with radiculopathy 08/18/2016   Morbid obesity with BMI of 40.0-44.9, adult ([place]) 07/28/2016   Chronic pain syndrome 10/24/2015   Dysfunction of both eustachian tubes 07/26/2014   Calculus of left kidney 04/25/2014   Impaired fasting glucose 04/05/2014   History of bladder surgery 04/03/2014   History of hysterectomy 04/03/2014   Urge incontinence 04/03/2014  Asthma 07/28/2013   Nontoxic uninodular goiter 07/28/2013   Solitary pulmonary nodule 07/28/2013   Lumbar radiculopathy 04/20/2013   Insomnia 03/22/2013   Essential hypertension 08/18/2011   Tobacco use disorder 08/18/2011   Moderate episode of recurrent major depressive disorder ([place]) 04/03/2011   Other B-complex deficiencies 08/16/2010   Family history of cerebrovascular accident (CVA) 04/17/2010   Family history of malignant neoplasm of gastrointestinal tract 04/17/2010   Family history of other cardiovascular diseases(V17.49) 04/17/2010   Past Medical History:   Diagnosis Date   Allergy    Anemia    history of   Anxiety    Arthritis    Asthma    Atherosclerosis    Depression    Family history of adverse reaction to anesthesia    pt's mother felt everything and couldn't speak during a gallbladder surgery   GERD (gastroesophageal reflux disease)    History of hiatal hernia    History of kidney stones    Hypertension    Pneumonia    a few times   TIA (transient ischemic attack) 2012    Family History  Problem Relation Age of Onset   Anxiety disorder Mother    Depression Mother    Heart disease Mother    Hypertension Mother    Arrhythmia Mother    Cancer Father        colon, age greater than 64   Lung cancer Father    Migraines Sister    Cancer Daughter        nerve sheath sarcoma   Alcohol abuse Brother    Breast cancer Neg Hx     Past Surgical History:  Procedure Laterality Date   ABDOMINAL HYSTERECTOMY     abdominal   APPENDECTOMY     bladder tack     CARPAL TUNNEL RELEASE Bilateral    CERVICAL SPINE SURGERY     5-6, 6-7   CHOLECYSTECTOMY     COLONOSCOPY WITH PROPOFOL N/A 07/10/2020   Procedure: COLONOSCOPY WITH PROPOFOL;  Surgeon: Eloise Harman, DO;  [facility];  Service: Endoscopy;  Laterality: N/A;  12:15pm   HAMMER TOE SURGERY Right    JOINT REPLACEMENT     TOTAL HIP 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   Procedure: LEFT TOTAL HIP ARTHROPLASTY -DIRECT ANTERIOR;  Surgeon: Marybelle Killings, MD;  Location: [facility];  Service: Orthopedics;  Laterality: Left;   TOTAL HIP ARTHROPLASTY Right 12/05/2019   Procedure: RIGHT TOTAL HIP ARTHROPLASTY ANTERIOR APPROACH  DIRECT ANTERIOR;  Surgeon: Marybelle Killings, MD;  Location: [facility];  Service: Orthopedics;  Laterality: Right;   Social History   Occupational History   Not on file  Tobacco Use   Smoking status: Every Day    Packs/day: 0.50    Years: 23.00    Pack years: 11.50    Types: Cigarettes   Smokeless tobacco: Never  Vaping Use   Vaping Use: Never used  Substance  and Sexual Activity   Alcohol use: Yes    Comment: occ   Drug use: Never   Sexual activity: Not Currently

## 2021-08-28 ENCOUNTER — Ambulatory Visit: Payer: No Typology Code available for payment source | Admitting: Family Medicine

## 2021-08-28 ENCOUNTER — Encounter: Payer: Self-pay | Admitting: Family Medicine

## 2021-08-28 VITALS — BP 130/78 | HR 81 | Temp 98.2°F | Ht 65.0 in | Wt 233.0 lb

## 2021-08-28 DIAGNOSIS — L03116 Cellulitis of left lower limb: Secondary | ICD-10-CM

## 2021-08-28 DIAGNOSIS — R590 Localized enlarged lymph nodes: Secondary | ICD-10-CM

## 2021-08-28 MED ORDER — CEPHALEXIN 500 MG PO CAPS: 500.0000 mg | ORAL_CAPSULE | Freq: Three times a day (TID) | ORAL | 0 refills | Status: AC

## 2021-08-28 NOTE — Progress Notes (Signed)
Subjective:  Patient ID: Sydney [name], female    DOB: April 29, 1961, 61 y.o.   MRN: [id number]  Patient Care Team: Raliegh Ip, DO as PCP - General (Family Medicine) Lanelle Bal, DO as Consulting Physician (Internal Medicine)   Chief Complaint:  Cellulitis   HPI: Sydney [name] is a 61 y.o. female presenting on 08/28/2021 for Cellulitis   Pt presents today for evaluation of ongoing LLE cellulitis. She was seen and treated by Dr. Louanne Skye on 07/25/2021. She was placed on Keflex and placed in an [employer] for 7 days. She states the redness and swelling improved slightly but continues and she has now developed lymphadenopathy in her left groin. There are concerns for vascular insufficiency and she is scheduled to see vascular surgery soon. She denies fever, chills, confusion, or weakness.      Relevant past medical, surgical, family, and social history reviewed and updated as indicated.  Allergies and medications reviewed and updated. Data reviewed: Chart in Epic.   Past Medical History:  Diagnosis Date   Allergy    Anemia    history of   Anxiety    Arthritis    Asthma    Atherosclerosis    Depression    Family history of adverse reaction to anesthesia    pt's mother felt everything and couldn't speak during a gallbladder surgery   GERD (gastroesophageal reflux disease)    History of hiatal hernia    History of kidney stones    Hypertension    Pneumonia    a few times   TIA (transient ischemic attack) 2012    Past Surgical History:  Procedure Laterality Date   ABDOMINAL HYSTERECTOMY     abdominal   APPENDECTOMY     bladder tack     CARPAL TUNNEL RELEASE Bilateral    CERVICAL SPINE SURGERY     5-6, 6-7   CHOLECYSTECTOMY     COLONOSCOPY WITH PROPOFOL N/A 07/10/2020   Procedure: COLONOSCOPY WITH PROPOFOL;  Surgeon: Lanelle Bal, DO;  [facility];  Service: Endoscopy;  Laterality: N/A;  12:15pm   HAMMER TOE SURGERY Right    JOINT  REPLACEMENT     TOTAL HIP 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   Procedure: LEFT TOTAL HIP ARTHROPLASTY -DIRECT ANTERIOR;  Surgeon: Eldred Manges, MD;  Location: [facility];  Service: Orthopedics;  Laterality: Left;   TOTAL HIP ARTHROPLASTY Right 12/05/2019   Procedure: RIGHT TOTAL HIP ARTHROPLASTY ANTERIOR APPROACH  DIRECT ANTERIOR;  Surgeon: Eldred Manges, MD;  Location: [facility];  Service: Orthopedics;  Laterality: Right;    Social History   Socioeconomic History   Marital status: Married    Spouse name: Not on file   Number of children: 3   Years of education: Not on file   Highest education level: Not on file  Occupational History   Not on file  Tobacco Use   Smoking status: Every Day    Packs/day: 0.50    Years: 23.00    Pack years: 11.50    Types: Cigarettes   Smokeless tobacco: Never  Vaping Use   Vaping Use: Never used  Substance and Sexual Activity   Alcohol use: Yes    Comment: occ   Drug use: Never   Sexual activity: Not Currently  Other Topics Concern   Not on file  Social History Narrative   Recently relocated to [place] from [place].  She resides at home with her husband.   She has 3 children  but 1 passed away from cancer.   Social Determinants of Health   Financial Resource Strain: Not on file  Food Insecurity: Not on file  Transportation Needs: Not on file  Physical Activity: Not on file  Stress: Not on file  Social Connections: Not on file  Intimate Partner Violence: Not on file    Outpatient Encounter Medications as of 08/28/2021  Medication Sig   albuterol (VENTOLIN HFA) 108 (90 Base) MCG/ACT inhaler Inhale 2 puffs into the lungs every 6 (six) hours as needed for wheezing or shortness of breath.   amLODipine (NORVASC) 5 MG tablet Take 1 tablet (5 mg total) by mouth daily.   augmented betamethasone dipropionate (DIPROLENE-AF) 0.05 % cream Apply topically.   cephALEXin (KEFLEX) 500 MG capsule Take 1 capsule (500 mg total) by mouth 3 (three) times daily for 7  days.   cetirizine (ZYRTEC) 10 MG tablet Take 10 mg by mouth daily.   hydrOXYzine (ATARAX) 10 MG tablet Take 1 tablet (10 mg total) by mouth 3 (three) times daily as needed.   metoprolol succinate (TOPROL-XL) 25 MG 24 hr tablet Take 1 tablet (25 mg total) by mouth daily.   mirabegron ER (MYRBETRIQ) 25 MG TB24 tablet    rosuvastatin (CRESTOR) 10 MG tablet Take 10 mg by mouth every other day.   sertraline (ZOLOFT) 100 MG tablet Take 1.5 tablets (150 mg total) by mouth daily.   traZODone (DESYREL) 100 MG tablet TAKE 1 TO 2 TABLETS AT     BEDTIME AS NEEDED FOR SLEEP   esomeprazole (NEXIUM) 40 MG capsule Take 1 capsule (40 mg total) by mouth daily. (Patient not taking: Reported on 08/28/2021)   HYDROcodone-acetaminophen (NORCO/VICODIN) 5-325 MG tablet Take 1 tablet by mouth every 4 (four) hours as needed for severe pain. (Patient not taking: Reported on 08/28/2021)   [DISCONTINUED] cephALEXin (KEFLEX) 500 MG capsule Take 1 capsule (500 mg total) by mouth 4 (four) times daily.   [DISCONTINUED] furosemide (LASIX) 20 MG tablet Take 1 tablet (20 mg total) by mouth daily.   No facility-administered encounter medications on file as of 08/28/2021.    Allergies  Allergen Reactions   Bee Pollen Anaphylaxis and Swelling   Bee Venom Anaphylaxis and Swelling   Butorphanol Other (See Comments)    Not sure told by md she was allergic after surgery.    Meloxicam Anxiety    Mood disorder Altered her personality    Penicillins Anaphylaxis and Hives    Unable to Recall As child mom was told she is highly allergic Did it involve swelling of the face/tongue/throat, SOB, or low BP? Unknown Did it involve sudden or severe rash/hives, skin peeling, or any reaction on the inside of your mouth or nose? Unknown Did you need to seek medical attention at a hospital or doctor's office? Unknown When did it last happen?      childhood allergy If all above answers are NO, may proceed with cephalosporin use.     Prochlorperazine Edisylate Anaphylaxis   Sulfa Antibiotics Anaphylaxis    Unable to Recall   Tramadol Anxiety    Didn't like the way it made her feel    Clindamycin Hives   Levaquin [Levofloxacin] Itching   Topiramate Nausea And Vomiting   Doxycycline Dermatitis, Hives, Itching, Photosensitivity and Rash    Review of Systems  Constitutional:  Negative for activity change, appetite change, chills, diaphoresis, fatigue, fever and unexpected weight change.  Respiratory:  Negative for cough and shortness of breath.   Cardiovascular:  Positive  for leg swelling. Negative for chest pain and palpitations.  Genitourinary:  Negative for decreased urine volume and difficulty urinating.  Skin:  Positive for color change, rash and wound. Negative for pallor.  Neurological:  Negative for weakness and headaches.  Hematological:  Positive for adenopathy.  Psychiatric/Behavioral:  Negative for confusion.   All other systems reviewed and are negative.      Objective:  BP 130/78    Pulse 81    Temp 98.2 F (36.8 C) (Temporal)    Ht 5\' 5"  (1.651 m)    Wt 233 lb (105.7 kg)    BMI 38.77 kg/m    Wt Readings from Last 3 Encounters:  08/28/21 233 lb (105.7 kg)  08/22/21 233 lb (105.7 kg)  07/25/21 233 lb (105.7 kg)    Physical Exam Vitals and nursing note reviewed.  Constitutional:      General: She is not in acute distress.    Appearance: Normal appearance. She is obese. She is not ill-appearing, toxic-appearing or diaphoretic.  HENT:     Head: Normocephalic and atraumatic.     Mouth/Throat:     Mouth: Mucous membranes are moist.  Eyes:     Conjunctiva/sclera: Conjunctivae normal.     Pupils: Pupils are equal, round, and reactive to light.  Cardiovascular:     Rate and Rhythm: Normal rate and regular rhythm.     Pulses: Normal pulses.     Heart sounds: Normal heart sounds.  Pulmonary:     Effort: Pulmonary effort is normal.     Breath sounds: Normal breath sounds.  Musculoskeletal:      Right lower leg: No edema.     Left lower leg: 1+ Edema present.  Lymphadenopathy:     Lower Body: Left inguinal adenopathy present.  Skin:    General: Skin is warm and dry.     Capillary Refill: Capillary refill takes less than 2 seconds.     Findings: Abrasion, erythema and rash present.     Comments: Erythematous rash that starts on left foot and spreads to left knee. Scaly patches with abrasions to medial ankle and lower leg, abrasion to lateral lower leg.  Slight erythema with abrasions to right lower anterior extremity, no swelling.   Neurological:     General: No focal deficit present.     Mental Status: She is alert and oriented to person, place, and time.     Gait: Gait abnormal (using cane).  Psychiatric:        Mood and Affect: Mood normal.        Behavior: Behavior normal.        Thought Content: Thought content normal.        Judgment: Judgment normal.    Results for orders placed or performed in visit on 06/12/21  Microscopic Examination   Urine  Result Value Ref Range   WBC, UA 0-5 0 - 5 /hpf   RBC 0-2 0 - 2 /hpf   Epithelial Cells (non renal) None seen 0 - 10 /hpf   Renal Epithel, UA None seen None seen /hpf   Mucus, UA Present Not Estab.   Bacteria, UA Few None seen/Few  Urinalysis, Routine w reflex microscopic  Result Value Ref Range   Specific Gravity, UA 1.020 1.005 - 1.030   pH, UA 7.0 5.0 - 7.5   Color, UA Amber (A) Yellow   Appearance Ur Clear Clear   Leukocytes,UA Negative Negative   Protein,UA Negative Negative/Trace   Glucose, UA Negative Negative  Ketones, UA Negative Negative   RBC, UA Trace (A) Negative   Bilirubin, UA Negative Negative   Urobilinogen, Ur 1.0 0.2 - 1.0 mg/dL   Nitrite, UA Negative Negative   Microscopic Examination See below:   BLADDER SCAN AMB NON-IMAGING  Result Value Ref Range   PVR 0.0 WU       Pertinent labs & imaging results that were available during my care of the patient were reviewed by me and considered in  my medical decision making.  Assessment & Plan:  Tanice was seen today for cellulitis.  Diagnoses and all orders for this visit:  Left leg cellulitis Lymphadenopathy, inguinal Continued cellulitis to LLE with left inguinal lymphadenopathy. Limited to treatment options due to multiple allergies. Will reapply [employer] today and place on keflex as prescribed. Pt aware to return to office in one week for removal of [employer] and reevaluation. Has follow up with vascular surgery to evaluate for potential underlying vascular disease causing recurrent cellulitis.  -     cephALEXin (KEFLEX) 500 MG capsule; Take 1 capsule (500 mg total) by mouth 3 (three) times daily for 7 days.     Continue all other maintenance medications.  Follow up plan: Return in 1 week (on 09/04/2021), or if symptoms worsen or fail to improve, for [employer] / Cellulitis .   Continue healthy lifestyle choices, including diet (rich in fruits, vegetables, and lean proteins, and low in salt and simple carbohydrates) and exercise (at least 30 minutes of moderate physical activity daily).  Educational handout given for cellulitis, unna boot  The above assessment and management plan was discussed with the patient. The patient verbalized understanding of and has agreed to the management plan. Patient is aware to call the clinic if they develop any new symptoms or if symptoms persist or worsen. Patient is aware when to return to the clinic for a follow-up visit. Patient educated on when it is appropriate to go to the emergency department.   Kari Baars, FNP-C [facility] [phone]

## 2021-08-29 ENCOUNTER — Encounter: Payer: Self-pay | Admitting: Family Medicine

## 2021-08-29 ENCOUNTER — Ambulatory Visit: Payer: No Typology Code available for payment source | Admitting: Family Medicine

## 2021-08-29 ENCOUNTER — Telehealth: Payer: Self-pay

## 2021-08-29 VITALS — BP 120/73 | HR 72 | Temp 97.2°F | Resp 20 | Ht 65.0 in | Wt 232.0 lb

## 2021-08-29 DIAGNOSIS — M792 Neuralgia and neuritis, unspecified: Secondary | ICD-10-CM

## 2021-08-29 DIAGNOSIS — R609 Edema, unspecified: Secondary | ICD-10-CM

## 2021-08-29 NOTE — Progress Notes (Signed)
BP 120/73    Pulse 72    Temp (!) 97.2 F (36.2 C) (Oral)    Resp 20    Ht 5\' 5"  (1.651 m)    Wt 232 lb (105.2 kg)    SpO2 98%    BMI 38.61 kg/m    Subjective:   Patient ID: Sydney [name], female    DOB: May 05, 1961, 61 y.o.   MRN: [id number]  HPI: Sydney [name] is a 61 y.o. female presenting on 08/29/2021 for No chief complaint on file.   HPI Patient is coming in with burning sensation in both of her lower extremities.  She does have an Unna boot on the left leg and that is left on the left leg and the right leg.  She says has been more dependent and unable to move as much because of the swelling and the [employer].  She has been getting more swelling because of it.  She denies any fevers or chills or new redness or warmth.  She still does have the Unna boot on the left leg.  It does look like blood flow and capillary refill intact in the left leg  Relevant past medical, surgical, family and social history reviewed and updated as indicated. Interim medical history since our last visit reviewed. Allergies and medications reviewed and updated.  Review of Systems  Constitutional:  Negative for chills and fever.  Eyes:  Negative for visual disturbance.  Respiratory:  Negative for chest tightness and shortness of breath.   Cardiovascular:  Positive for leg swelling. Negative for chest pain.  Musculoskeletal:  Negative for back pain and gait problem.  Skin:  Negative for rash.  Neurological:  Negative for light-headedness and headaches.  Psychiatric/Behavioral:  Negative for agitation and behavioral problems.   All other systems reviewed and are negative.  Per HPI unless specifically indicated above   Allergies as of 08/29/2021       Reactions   Bee Pollen Anaphylaxis, Swelling   Bee Venom Anaphylaxis, Swelling   Butorphanol Other (See Comments)   Not sure told by md she was allergic after surgery.   Meloxicam Anxiety   Mood disorder Altered her personality   Penicillins  Anaphylaxis, Hives   Unable to Recall As child mom was told she is highly allergic Did it involve swelling of the face/tongue/throat, SOB, or low BP? Unknown Did it involve sudden or severe rash/hives, skin peeling, or any reaction on the inside of your mouth or nose? Unknown Did you need to seek medical attention at a hospital or doctor's office? Unknown When did it last happen?      childhood allergy If all above answers are NO, may proceed with cephalosporin use.   Prochlorperazine Edisylate Anaphylaxis   Sulfa Antibiotics Anaphylaxis   Unable to Recall   Tramadol Anxiety   Didn't like the way it made her feel   Clindamycin Hives   Levaquin [levofloxacin] Itching   Topiramate Nausea And Vomiting   Doxycycline Dermatitis, Hives, Itching, Photosensitivity, Rash        Medication List        Accurate as of August 29, 2021  4:30 PM. If you have any questions, ask your nurse or doctor.          albuterol 108 (90 Base) MCG/ACT inhaler Commonly known as: VENTOLIN HFA Inhale 2 puffs into the lungs every 6 (six) hours as needed for wheezing or shortness of breath.   amLODipine 5 MG tablet Commonly known as: NORVASC Take 1 tablet (  5 mg total) by mouth daily.   augmented betamethasone dipropionate 0.05 % cream Commonly known as: DIPROLENE-AF Apply topically.   cephALEXin 500 MG capsule Commonly known as: Keflex Take 1 capsule (500 mg total) by mouth 3 (three) times daily for 7 days.   cetirizine 10 MG tablet Commonly known as: ZYRTEC Take 10 mg by mouth daily.   esomeprazole 40 MG capsule Commonly known as: NexIUM Take 1 capsule (40 mg total) by mouth daily.   HYDROcodone-acetaminophen 5-325 MG tablet Commonly known as: NORCO/VICODIN Take 1 tablet by mouth every 4 (four) hours as needed for severe pain.   hydrOXYzine 10 MG tablet Commonly known as: ATARAX Take 1 tablet (10 mg total) by mouth 3 (three) times daily as needed.   metoprolol succinate 25 MG 24 hr  tablet Commonly known as: TOPROL-XL Take 1 tablet (25 mg total) by mouth daily.   mirabegron ER 25 MG Tb24 tablet Commonly known as: MYRBETRIQ   rosuvastatin 10 MG tablet Commonly known as: CRESTOR Take 10 mg by mouth every other day.   sertraline 100 MG tablet Commonly known as: ZOLOFT Take 1.5 tablets (150 mg total) by mouth daily.   traZODone 100 MG tablet Commonly known as: DESYREL TAKE 1 TO 2 TABLETS AT     BEDTIME AS NEEDED FOR SLEEP         Objective:   BP 120/73    Pulse 72    Temp (!) 97.2 F (36.2 C) (Oral)    Resp 20    Ht 5\' 5"  (1.651 m)    Wt 232 lb (105.2 kg)    SpO2 98%    BMI 38.61 kg/m   Wt Readings from Last 3 Encounters:  08/29/21 232 lb (105.2 kg)  08/28/21 233 lb (105.7 kg)  08/22/21 233 lb (105.7 kg)    Physical Exam Vitals and nursing note reviewed.  Constitutional:      Appearance: Normal appearance.  Musculoskeletal:        General: Swelling (1+ peripheral edema) present.  Skin:    General: Skin is warm and dry.     Findings: No bruising, erythema, lesion or rash.  Neurological:     Mental Status: She is alert.      Assessment & Plan:   Problem List Items Addressed This Visit   None Visit Diagnoses     Edema, peripheral    -  Primary   Nerve pain           Right foot, likely nerve pain Likely because she is more dependent, recommended that she use her leg muscles more and keep them elevated more. Follow up plan: Return if symptoms worsen or fail to improve.  Counseling provided for all of the vaccine components No orders of the defined types were placed in this encounter.   Caryl Pina, MD [facility] 08/29/2021, 4:30 PM

## 2021-08-29 NOTE — Telephone Encounter (Signed)
Pt wants to be seen today. [facility] does not have anything. [facility] had an opening at 3:55 so pt scheduled there.

## 2021-08-29 NOTE — Telephone Encounter (Signed)
Pt stating she went to bed last night with her legs feeling like they are on fire and in severe pain, scale 1-10 it was a 9. She took a hydrocodone to sleep but didn't help. Woke up this morning in severe pain again wants recommendations on what she can do.

## 2021-08-29 NOTE — Telephone Encounter (Signed)
If her pain is this severe she needs to be seen.

## 2021-09-02 ENCOUNTER — Telehealth: Payer: Self-pay | Admitting: Family Medicine

## 2021-09-02 NOTE — Telephone Encounter (Signed)
Pt has appt scheduled tomorrow with Sydney [name] at 2:05 and pt was advised to keep appt and in the meantime keep legs elevated and she states she has open wounds so advised pt not to soak in epsom salt any further till she has been evaluated tomorrow. Pt voiced understanding.

## 2021-09-03 ENCOUNTER — Encounter: Payer: Self-pay | Admitting: Family Medicine

## 2021-09-03 ENCOUNTER — Ambulatory Visit: Payer: No Typology Code available for payment source | Admitting: Family Medicine

## 2021-09-03 VITALS — BP 104/66 | HR 66 | Temp 97.7°F | Ht 65.0 in | Wt 232.0 lb

## 2021-09-03 DIAGNOSIS — I872 Venous insufficiency (chronic) (peripheral): Secondary | ICD-10-CM | POA: Diagnosis not present

## 2021-09-03 NOTE — Progress Notes (Signed)
Subjective:  Patient ID: Sydney [name], female    DOB: 01/22/1961, 61 y.o.   MRN: [id number]  Patient Care Team: Raliegh IpGottschalk, Ashly M, DO as PCP - General (Family Medicine) Lanelle Balarver, Charles K, DO as Consulting Physician (Internal Medicine)   Chief Complaint:  Cellulitis   HPI: Sydney [name] is a 61 y.o. female presenting on 09/03/2021 for Cellulitis   Pt presents today for reevaluation of BLE swelling and redness, left worse than right. She has been seen multiple times over the past few weeks with same complaint. She has been treated with [employer] and oral antibiotics. Erythema has improved greatly but she now has erosions to LLE. She denies signs of systemic infection such as fever, chills, weakness, fatigue, or confusion. Has follow up with vascular surgery next month for venous insufficiency.    Relevant past medical, surgical, family, and social history reviewed and updated as indicated.  Allergies and medications reviewed and updated. Data reviewed: Chart in Epic.   Past Medical History:  Diagnosis Date   Allergy    Anemia    history of   Anxiety    Arthritis    Asthma    Atherosclerosis    Depression    Family history of adverse reaction to anesthesia    pt's mother felt everything and couldn't speak during a gallbladder surgery   GERD (gastroesophageal reflux disease)    History of hiatal hernia    History of kidney stones    Hypertension    Pneumonia    a few times   TIA (transient ischemic attack) 2012    Past Surgical History:  Procedure Laterality Date   ABDOMINAL HYSTERECTOMY     abdominal   APPENDECTOMY     bladder tack     CARPAL TUNNEL RELEASE Bilateral    CERVICAL SPINE SURGERY     5-6, 6-7   CHOLECYSTECTOMY     COLONOSCOPY WITH PROPOFOL N/A 07/10/2020   Procedure: COLONOSCOPY WITH PROPOFOL;  Surgeon: Lanelle Balarver, Charles K, DO;  Location: [facility];  Service: Endoscopy;  Laterality: N/A;  12:15pm   HAMMER TOE SURGERY Right    JOINT  REPLACEMENT     TOTAL HIP 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   Procedure: LEFT TOTAL HIP ARTHROPLASTY -DIRECT ANTERIOR;  Surgeon: Eldred MangesYates, Mark C, MD;  Location: [facility] OR;  Service: Orthopedics;  Laterality: Left;   TOTAL HIP ARTHROPLASTY Right 12/05/2019   Procedure: RIGHT TOTAL HIP ARTHROPLASTY ANTERIOR APPROACH  DIRECT ANTERIOR;  Surgeon: Eldred MangesYates, Mark C, MD;  Location: [facility] OR;  Service: Orthopedics;  Laterality: Right;    Social History   Socioeconomic History   Marital status: Married    Spouse name: Not on file   Number of children: 3   Years of education: Not on file   Highest education level: Not on file  Occupational History   Not on file  Tobacco Use   Smoking status: Every Day    Packs/day: 0.50    Years: 23.00    Pack years: 11.50    Types: Cigarettes   Smokeless tobacco: Never  Vaping Use   Vaping Use: Never used  Substance and Sexual Activity   Alcohol use: Yes    Comment: occ   Drug use: Never   Sexual activity: Not Currently  Other Topics Concern   Not on file  Social History Narrative   [place]  She resides at home with her husband.   She has 3 children but 1 passed  away from cancer.   Social Determinants of Health   Financial Resource Strain: Not on file  Food Insecurity: Not on file  Transportation Needs: Not on file  Physical Activity: Not on file  Stress: Not on file  Social Connections: Not on file  Intimate Partner Violence: Not on file    Outpatient Encounter Medications as of 09/03/2021  Medication Sig   albuterol (VENTOLIN HFA) 108 (90 Base) MCG/ACT inhaler Inhale 2 puffs into the lungs every 6 (six) hours as needed for wheezing or shortness of breath.   amLODipine (NORVASC) 5 MG tablet Take 1 tablet (5 mg total) by mouth daily.   augmented betamethasone dipropionate (DIPROLENE-AF) 0.05 % cream Apply topically.   cephALEXin (KEFLEX) 500 MG capsule Take 1 capsule (500 mg total) by mouth 3 (three) times daily for 7  days.   cetirizine (ZYRTEC) 10 MG tablet Take 10 mg by mouth daily.   esomeprazole (NEXIUM) 40 MG capsule Take 1 capsule (40 mg total) by mouth daily.   HYDROcodone-acetaminophen (NORCO/VICODIN) 5-325 MG tablet Take 1 tablet by mouth every 4 (four) hours as needed for severe pain.   hydrOXYzine (ATARAX) 10 MG tablet Take 1 tablet (10 mg total) by mouth 3 (three) times daily as needed.   metoprolol succinate (TOPROL-XL) 25 MG 24 hr tablet Take 1 tablet (25 mg total) by mouth daily.   mirabegron ER (MYRBETRIQ) 25 MG TB24 tablet    rosuvastatin (CRESTOR) 10 MG tablet Take 10 mg by mouth every other day.   sertraline (ZOLOFT) 100 MG tablet Take 1.5 tablets (150 mg total) by mouth daily.   traZODone (DESYREL) 100 MG tablet TAKE 1 TO 2 TABLETS AT     BEDTIME AS NEEDED FOR SLEEP   No facility-administered encounter medications on file as of 09/03/2021.    Allergies  Allergen Reactions   Bee Pollen Anaphylaxis and Swelling   Bee Venom Anaphylaxis and Swelling   Butorphanol Other (See Comments)    Not sure told by md she was allergic after surgery.    Meloxicam Anxiety    Mood disorder Altered her personality    Penicillins Anaphylaxis and Hives    Unable to Recall As child mom was told she is highly allergic Did it involve swelling of the face/tongue/throat, SOB, or low BP? Unknown Did it involve sudden or severe rash/hives, skin peeling, or any reaction on the inside of your mouth or nose? Unknown Did you need to seek medical attention at a hospital or doctor's office? Unknown When did it last happen?      childhood allergy If all above answers are NO, may proceed with cephalosporin use.    Prochlorperazine Edisylate Anaphylaxis   Sulfa Antibiotics Anaphylaxis    Unable to Recall   Tramadol Anxiety    Didn't like the way it made her feel    Clindamycin Hives   Levaquin [Levofloxacin] Itching   Topiramate Nausea And Vomiting   Doxycycline Dermatitis, Hives, Itching,  Photosensitivity and Rash    Review of Systems  Constitutional:  Negative for activity change, appetite change, chills, fatigue and fever.  HENT: Negative.    Eyes: Negative.   Respiratory:  Negative for cough, chest tightness and shortness of breath.   Cardiovascular:  Positive for leg swelling. Negative for chest pain and palpitations.  Gastrointestinal:  Negative for abdominal pain, blood in stool, constipation, diarrhea, nausea and vomiting.  Endocrine: Negative.   Genitourinary:  Negative for decreased urine volume, dysuria, frequency and urgency.  Musculoskeletal:  Negative for  arthralgias and myalgias.  Skin:  Positive for color change, rash and wound. Negative for pallor.  Allergic/Immunologic: Negative.   Neurological:  Negative for dizziness, tremors, seizures, syncope, facial asymmetry, speech difficulty, weakness, light-headedness, numbness and headaches.  Hematological: Negative.   Psychiatric/Behavioral:  Negative for confusion, hallucinations, sleep disturbance and suicidal ideas.   All other systems reviewed and are negative.      Objective:  BP 104/66    Pulse 66    Temp 97.7 F (36.5 C) (Temporal)    Ht 5\' 5"  (1.651 m)    Wt 232 lb (105.2 kg)    BMI 38.61 kg/m    Wt Readings from Last 3 Encounters:  09/03/21 232 lb (105.2 kg)  08/29/21 232 lb (105.2 kg)  08/28/21 233 lb (105.7 kg)    Physical Exam Vitals and nursing note reviewed.  Constitutional:      General: She is not in acute distress.    Appearance: Normal appearance. She is obese. She is not ill-appearing, toxic-appearing or diaphoretic.  HENT:     Head: Normocephalic and atraumatic.  Eyes:     Pupils: Pupils are equal, round, and reactive to light.  Cardiovascular:     Rate and Rhythm: Normal rate and regular rhythm.     Pulses: Normal pulses.     Heart sounds: Normal heart sounds.     Comments: LLE with erythema, scaling, hyperpigmentation and erosions.  RLE with erythema, scaling, and  hyperpigmentation.  Pulmonary:     Effort: Pulmonary effort is normal.     Breath sounds: Normal breath sounds.  Musculoskeletal:     Right lower leg: 1+ Edema present.     Left lower leg: 1+ Edema present.  Skin:    General: Skin is warm and dry.     Capillary Refill: Capillary refill takes less than 2 seconds.     Comments: BLE as noted.  Neurological:     General: No focal deficit present.     Mental Status: She is alert and oriented to person, place, and time.     Gait: Gait abnormal (using cane).  Psychiatric:        Mood and Affect: Mood normal.        Behavior: Behavior normal.        Thought Content: Thought content normal.        Judgment: Judgment normal.          Results for orders placed or performed in visit on 06/12/21  Microscopic Examination   Urine  Result Value Ref Range   WBC, UA 0-5 0 - 5 /hpf   RBC 0-2 0 - 2 /hpf   Epithelial Cells (non renal) None seen 0 - 10 /hpf   Renal Epithel, UA None seen None seen /hpf   Mucus, UA Present Not Estab.   Bacteria, UA Few None seen/Few  Urinalysis, Routine w reflex microscopic  Result Value Ref Range   Specific Gravity, UA 1.020 1.005 - 1.030   pH, UA 7.0 5.0 - 7.5   Color, UA Amber (A) Yellow   Appearance Ur Clear Clear   Leukocytes,UA Negative Negative   Protein,UA Negative Negative/Trace   Glucose, UA Negative Negative   Ketones, UA Negative Negative   RBC, UA Trace (A) Negative   Bilirubin, UA Negative Negative   Urobilinogen, Ur 1.0 0.2 - 1.0 mg/dL   Nitrite, UA Negative Negative   Microscopic Examination See below:   BLADDER SCAN AMB NON-IMAGING  Result Value Ref Range   PVR  0.0 WU       Pertinent labs & imaging results that were available during my care of the patient were reviewed by me and considered in my medical decision making.  Assessment & Plan:  Sydney [name] was seen today for cellulitis.  Diagnoses and all orders for this visit:  Peripheral vascular disease with stasis  dermatitis Bilateral stasis dermatitis, left worse than right. Has follow up with vascular surgery scheduled. Cellulitis has resolved. Symptomatic care with steroid cream, CeraVe and compression stockings discussed in detail. Elevate legs when sitting. Walk as much as possible. Pt aware to report any new, worsening, or persistent symptoms.     Continue all other maintenance medications.  Follow up plan: Return in 4 weeks (on 10/01/2021), or if symptoms worsen or fail to improve.   Continue healthy lifestyle choices, including diet (rich in fruits, vegetables, and lean proteins, and low in salt and simple carbohydrates) and exercise (at least 30 minutes of moderate physical activity daily).  Educational handout given for stasis dermatitis   The above assessment and management plan was discussed with the patient. The patient verbalized understanding of and has agreed to the management plan. Patient is aware to call the clinic if they develop any new symptoms or if symptoms persist or worsen. Patient is aware when to return to the clinic for a follow-up visit. Patient educated on when it is appropriate to go to the emergency department.   Kari Baars, FNP-C [facility] [phone]

## 2021-09-05 ENCOUNTER — Encounter: Payer: Self-pay | Admitting: Family Medicine

## 2021-09-12 ENCOUNTER — Other Ambulatory Visit: Payer: Self-pay

## 2021-09-12 ENCOUNTER — Ambulatory Visit: Payer: No Typology Code available for payment source | Admitting: Orthopaedic Surgery

## 2021-09-24 ENCOUNTER — Other Ambulatory Visit: Payer: Self-pay | Admitting: Family Medicine

## 2021-09-24 DIAGNOSIS — F32A Depression, unspecified: Secondary | ICD-10-CM

## 2021-09-24 MED ORDER — SERTRALINE HCL 100 MG PO TABS: 150.0000 mg | ORAL_TABLET | Freq: Every day | ORAL | 3 refills | Status: DC

## 2021-09-24 NOTE — Telephone Encounter (Signed)
?  Prescription Request ? ?09/24/2021 ? ?Is this a "Controlled Substance" medicine? no ? ?Have you seen your PCP in the last 2 weeks? no ? ?If YES, route message to pool  -  If NO, patient needs to be scheduled for appointment. ? ?What is the name of the medication or equipment? sertraline (ZOLOFT) 100 MG tablet ? ?Have you contacted your pharmacy to request a refill? Yes   ? ?Which pharmacy would you like this sent to? [facility]  ? ? ?Patient notified that their request is being sent to the clinical staff for review and that they should receive a response within 2 business days.  ?  ?

## 2021-09-24 NOTE — Telephone Encounter (Signed)
Pt changing to mail order pharmacy & qty needs to be updated. Last OV no notes for Dx but she mentioned that it was discussed how local pharmacy had mixed up her last refill this is why she is changing to mail order. She will be calling back to make a CPE appt ?

## 2021-09-26 ENCOUNTER — Ambulatory Visit: Payer: No Typology Code available for payment source | Admitting: Gastroenterology

## 2021-09-30 ENCOUNTER — Encounter: Payer: Self-pay | Admitting: Surgery

## 2021-09-30 ENCOUNTER — Other Ambulatory Visit: Payer: Self-pay

## 2021-09-30 ENCOUNTER — Ambulatory Visit: Payer: No Typology Code available for payment source | Admitting: Surgery

## 2021-09-30 VITALS — BP 123/73 | HR 65 | Temp 98.3°F | Resp 20 | Ht 65.0 in | Wt 234.0 lb

## 2021-09-30 DIAGNOSIS — I83893 Varicose veins of bilateral lower extremities with other complications: Secondary | ICD-10-CM

## 2021-09-30 NOTE — Progress Notes (Signed)
[facility]  Patient name: Sydney [name] MRN: [id number] DOB: 09-25-1960 Sex: female   REASON FOR VISIT:    Follow up  HISOTRY OF PRESENT ILLNESS:    Sydney [name] is a 61 y.o. female who returns today for follow-up.  She was seen by Dr. Carlis Abbott 3 months ago for multiple bouts of left leg cellulitis.  She is also complaining of itching.  She was having prominent leg swelling.  She was wearing compression socks.  She has not had any history of DVT.  It was felt that because of her leg swelling and skin changes that her issues were consistent with venous insufficiency which was confirmed with her Doppler studies.  She is back today for consideration of intervention.  She states that with the compression socks, her swelling got a lot better as did her symptoms.   PAST MEDICAL HISTORY:   Past Medical History:  Diagnosis Date   Allergy    Anemia    history of   Anxiety    Arthritis    Asthma    Atherosclerosis    Depression    Family history of adverse reaction to anesthesia    pt's mother felt everything and couldn't speak during a gallbladder surgery   GERD (gastroesophageal reflux disease)    History of hiatal hernia    History of kidney stones    Hypertension    Pneumonia    a few times   TIA (transient ischemic attack) 2012     FAMILY HISTORY:   Family History  Problem Relation Age of Onset   Anxiety disorder Mother    Depression Mother    Heart disease Mother    Hypertension Mother    Arrhythmia Mother    Cancer Father        colon, age greater than 71   Lung cancer Father    Migraines Sister    Cancer Daughter        nerve sheath sarcoma   Alcohol abuse Brother    Breast cancer Neg Hx     SOCIAL HISTORY:   Social History   Tobacco Use   Smoking status: Every Day    Packs/day: 0.50    Years: 23.00    Pack years: 11.50    Types: Cigarettes   Smokeless tobacco: Never  Substance Use  Topics   Alcohol use: Yes    Comment: occ     ALLERGIES:   Allergies  Allergen Reactions   Bee Pollen Anaphylaxis and Swelling   Bee Venom Anaphylaxis and Swelling   Butorphanol Other (See Comments)    Not sure told by md she was allergic after surgery.    Meloxicam Anxiety    Mood disorder Altered her personality    Penicillins Anaphylaxis and Hives    Unable to Recall As child mom was told she is highly allergic Did it involve swelling of the face/tongue/throat, SOB, or low BP? Unknown Did it involve sudden or severe rash/hives, skin peeling, or any reaction on the inside of your mouth or nose? Unknown Did you need to seek medical attention at a hospital or doctor's office? Unknown When did it last happen?      childhood allergy If all above answers are NO, may proceed with cephalosporin use.    Prochlorperazine Edisylate Anaphylaxis   Sulfa Antibiotics Anaphylaxis    Unable to Recall   Tramadol Anxiety    Didn't like the way it made her feel    Clindamycin  Hives   Levaquin [Levofloxacin] Itching   Topiramate Nausea And Vomiting   Doxycycline Dermatitis, Hives, Itching, Photosensitivity and Rash     CURRENT MEDICATIONS:   Current Outpatient Medications  Medication Sig Dispense Refill   albuterol (VENTOLIN HFA) 108 (90 Base) MCG/ACT inhaler Inhale 2 puffs into the lungs every 6 (six) hours as needed for wheezing or shortness of breath.     amLODipine (NORVASC) 5 MG tablet Take 1 tablet (5 mg total) by mouth daily. 90 tablet 2   augmented betamethasone dipropionate (DIPROLENE-AF) 0.05 % cream Apply topically.     cetirizine (ZYRTEC) 10 MG tablet Take 10 mg by mouth daily.     HYDROcodone-acetaminophen (NORCO/VICODIN) 5-325 MG tablet Take 1 tablet by mouth every 4 (four) hours as needed for severe pain. 30 tablet 0   metoprolol succinate (TOPROL-XL) 25 MG 24 hr tablet Take 1 tablet (25 mg total) by mouth daily. 90 tablet 2   rosuvastatin (CRESTOR) 10 MG tablet  Take 10 mg by mouth every other day.     sertraline (ZOLOFT) 100 MG tablet Take 1.5 tablets (150 mg total) by mouth daily. 90 tablet 3   traZODone (DESYREL) 100 MG tablet TAKE 1 TO 2 TABLETS AT     BEDTIME AS NEEDED FOR SLEEP 180 tablet 1   No current facility-administered medications for this visit.    REVIEW OF SYSTEMS:   [X]  denotes positive finding, [ ]  denotes negative finding Cardiac  Comments:  Chest pain or chest pressure:    Shortness of breath upon exertion:    Short of breath when lying flat:    Irregular heart rhythm:        Vascular    Pain in calf, thigh, or hip brought on by ambulation:    Pain in feet at night that wakes you up from your sleep:     Blood clot in your veins:    Leg swelling:  x       Pulmonary    Oxygen at home:    Productive cough:     Wheezing:         Neurologic    Sudden weakness in arms or legs:     Sudden numbness in arms or legs:     Sudden onset of difficulty speaking or slurred speech:    Temporary loss of vision in one eye:     Problems with dizziness:         Gastrointestinal    Blood in stool:     Vomited blood:         Genitourinary    Burning when urinating:     Blood in urine:        Psychiatric    Major depression:         Hematologic    Bleeding problems:    Problems with blood clotting too easily:        Skin    Rashes or ulcers:        Constitutional    Fever or chills:      PHYSICAL EXAM:   Vitals:   09/30/21 1142  BP: 123/73  Pulse: 65  Resp: 20  Temp: 98.3 F (36.8 C)  SpO2: 97%  Weight: 234 lb (106.1 kg)  Height: 5\' 5"  (1.651 m)    GENERAL: The patient is a well-nourished female, in no acute distress. The vital signs are documented above. CARDIAC: There is a regular rate and rhythm.  VASCULAR: Left saphenous vein was evaluated with ultrasound.  There are several varicosities around the knee.  The vein is of adequate size for ablation.  PULMONARY: Non-labored respirations ABDOMEN: Soft and  non-tender with normal pitched bowel sounds.  MUSCULOSKELETAL: There are no major deformities or cyanosis. NEUROLOGIC: No focal weakness or paresthesias are detected. SKIN: See photos below PSYCHIATRIC: The patient has a normal affect.     STUDIES:   I have reviewed the following reflux test: Venous Reflux Times  +--------------+---------+------+-----------+------------+--------+   RIGHT          Reflux No Reflux Reflux Time Diameter cms Comments                              Yes                                       +--------------+---------+------+-----------+------------+--------+   CFV            no                                                   +--------------+---------+------+-----------+------------+--------+   FV prox        no                                                   +--------------+---------+------+-----------+------------+--------+   FV mid         no                                                   +--------------+---------+------+-----------+------------+--------+   FV dist        no                                                   +--------------+---------+------+-----------+------------+--------+   Popliteal      no                                                   +--------------+---------+------+-----------+------------+--------+   GSV at SFJ                yes     >500 ms      0.554                +--------------+---------+------+-----------+------------+--------+   GSV prox thigh            yes     >500 ms      0.483                +--------------+---------+------+-----------+------------+--------+   GSV mid thigh             yes     >500 ms  0.548                +--------------+---------+------+-----------+------------+--------+   GSV dist thigh            yes     >500 ms      0.548                +--------------+---------+------+-----------+------------+--------+   GSV at knee    no                              0.298                 +--------------+---------+------+-----------+------------+--------+   GSV prox calf                                  0.256                +--------------+---------+------+-----------+------------+--------+   GSV mid calf                                   0.444                +--------------+---------+------+-----------+------------+--------+   SSV Pop Fossa  no                              0.511                +--------------+---------+------+-----------+------------+--------+   SSV prox calf  no                              0.317                +--------------+---------+------+-----------+------------+--------+   SSV mid calf                                   0.343                +--------------+---------+------+-----------+------------+--------+      +--------------+---------+------+-----------+------------+--------+   LEFT           Reflux No Reflux Reflux Time Diameter cms Comments                              Yes                                       +--------------+---------+------+-----------+------------+--------+   CFV                       yes    >1 second                          +--------------+---------+------+-----------+------------+--------+   FV prox                   yes    >1 second                          +--------------+---------+------+-----------+------------+--------+  FV mid                    yes    >1 second                          +--------------+---------+------+-----------+------------+--------+   FV dist                   yes    >1 second                          +--------------+---------+------+-----------+------------+--------+   Popliteal      no                                                   +--------------+---------+------+-----------+------------+--------+   GSV at SFJ                yes     >500 ms      0.774                +--------------+---------+------+-----------+------------+--------+   GSV prox thigh            yes     >500 ms       0.456                +--------------+---------+------+-----------+------------+--------+   GSV mid thigh             yes     >500 ms      0.523                +--------------+---------+------+-----------+------------+--------+   GSV dist thigh            yes     >500 ms      0.407                +--------------+---------+------+-----------+------------+--------+   GSV at knee               yes     >500 ms      0.450                +--------------+---------+------+-----------+------------+--------+   GSV prox calf                                  0.511                +--------------+---------+------+-----------+------------+--------+   GSV mid calf                                   0.469                +--------------+---------+------+-----------+------------+--------+   SSV Pop Fossa             yes     >500 ms      0.327                +--------------+---------+------+-----------+------------+--------+   SSV prox calf             yes     >500 ms      0.568                +--------------+---------+------+-----------+------------+--------+  SSV mid calf                                   0.573                +--------------+---------+------+-----------+------------+--------+  MEDICAL ISSUES:   CEAP class II: I discussed with the patient that she has an adequate vein for laser ablation.  Since her symptoms improved with compression socks.  I suspect that her redness and itching and swelling are related to her venous insufficiency.  I discussed proceeding with left leg laser ablation first.  I do not think that she needs any stabs.  I will plan on cannulating the vein around the knee.  We will send her for insurance approval and proceed as indicated.    Leia Alf, MD, FACS [facility] [phone] Pager [phone]

## 2021-10-03 ENCOUNTER — Ambulatory Visit (INDEPENDENT_AMBULATORY_CARE_PROVIDER_SITE_OTHER): Payer: No Typology Code available for payment source | Admitting: Orthopaedic Surgery

## 2021-10-03 ENCOUNTER — Other Ambulatory Visit: Payer: Self-pay

## 2021-10-03 DIAGNOSIS — M7541 Impingement syndrome of right shoulder: Secondary | ICD-10-CM

## 2021-10-03 NOTE — Progress Notes (Signed)
? ?Office Visit Note ?  ?Patient: Sydney [name]           ?Date of Birth: 10/20/1960           ?MRN: [id number] ?Visit Date: 10/03/2021 ?             ?Requested by: Raliegh Ip, DO ?[address] [place],  [place] [address] ?PCP: Raliegh Ip, DO ? ? ?Assessment & Plan: ?Visit Diagnoses:  ?1. Impingement syndrome of right shoulder   ? ? ?Plan: Patient continues symptomatic with some glenohumeral arthritis noted on MRI scan.  No high riding head.  Partial rotator cuff tearing and biceps tendon tendinosis intra-articularly.  We will set up for some physical therapy recheck in 5 weeks if she is not better then neck step would be shoulder arthroscopy with likely biceps tenodesis and rotator cuff repair. ? ?Follow-Up Instructions: Return in about 5 weeks (around 11/07/2021).  ? ?Orders:  ?No orders of the defined types were placed in this encounter. ? ?No orders of the defined types were placed in this encounter. ? ? ? ? Procedures: ?No procedures performed ? ? ?Clinical Data: ?No additional findings. ? ? ?Subjective: ?Chief Complaint  ?Patient presents with  ? Right Shoulder - Follow-up  ? ? ?HPI 61 year old female returns with ongoing problems with her shoulder.  She had previous cervical fusion and dural which is healed.  MRI scan in January of her shoulder showed partial-thickness tearing of the supraspinatus without full-thickness tear and no retraction.  Proximal long head of the biceps tendinosis.  Some moderate to severe glenohumeral articular cartilage wear similar to previous MRI 2021 primarily posterior glenoid wear.  Patient does still get her arm up overhead but still has pain with activities and feels like her shoulder is getting somewhat weaker.  She has not been through any physical therapy for her shoulder. ? ?Review of Systems all the systems update unchanged. ? ? ?Objective: ?Vital Signs: There were no vitals taken for this visit. ? ?Physical Exam ?Constitutional:   ?   Appearance: She is  well-developed.  ?HENT:  ?   Head: Normocephalic.  ?   Right Ear: External ear normal.  ?   Left Ear: External ear normal. There is no impacted cerumen.  ?Eyes:  ?   Pupils: Pupils are equal, round, and reactive to light.  ?Neck:  ?   Thyroid: No thyromegaly.  ?   Trachea: No tracheal deviation.  ?Cardiovascular:  ?   Rate and Rhythm: Normal rate.  ?Pulmonary:  ?   Effort: Pulmonary effort is normal.  ?Abdominal:  ?   Palpations: Abdomen is soft.  ?Musculoskeletal:  ?   Cervical back: No rigidity.  ?Skin: ?   General: Skin is warm and dry.  ?Neurological:  ?   Mental Status: She is alert and oriented to person, place, and time.  ?Psychiatric:     ?   Behavior: Behavior normal.  ? ? ?Ortho Exam healed cervical incision positive impingement long of the biceps is tender.  She can get her arm up overhead.  Mild tenderness over the acromioclavicular joint. ? ?Specialty Comments:  ?No specialty comments available. ? ?Imaging: ?No results found. ? ? ?PMFS History: ?Patient Active Problem List  ? Diagnosis Date Noted  ? Peripheral vascular disease with stasis dermatitis 09/03/2021  ? Urine frequency 05/10/2021  ? Cellulitis of left leg 01/18/2021  ? Rash 01/18/2021  ? History of bilateral hip arthroplasty 01/03/2021  ? Upper respiratory tract infection 12/30/2020  ?  Non-recurrent acute serous otitis media of right ear 12/30/2020  ? Pharyngitis 10/29/2020  ? Closed fracture of multiple pubic rami, left, sequela 08/16/2020  ? H/O adenomatous polyp of colon 05/23/2020  ? FH: colon cancer in relative diagnosed at >62 years old 05/23/2020  ? Spinal stenosis of lumbar region 05/03/2020  ? Impingement syndrome of right shoulder 05/03/2020  ? H/O total hip arthroplasty 12/06/2019  ? Status post total replacement of left hip 09/15/2019  ? Back pain 07/01/2018  ? DDD (degenerative disc disease), lumbosacral 07/01/2018  ? Venous insufficiency of both lower extremities 11/17/2016  ? 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 Morbid obesity with BMI of 40.0-44.9, adult (HCC) 07/28/2016  ? Chronic pain syndrome 10/24/2015  ? Dysfunction of both eustachian tubes 07/26/2014  ? Calculus of left kidney 04/25/2014  ? Impaired fasting glucose 04/05/2014  ? History of bladder surgery 04/03/2014  ? History of hysterectomy 04/03/2014  ? Urge incontinence 04/03/2014  ? Asthma 07/28/2013  ? Nontoxic uninodular goiter 07/28/2013  ? Solitary pulmonary nodule 07/28/2013  ? Lumbar radiculopathy 04/20/2013  ? Insomnia 03/22/2013  ? Essential hypertension 08/18/2011  ? Tobacco use disorder 08/18/2011  ? Moderate episode of recurrent major depressive disorder (HCC) 04/03/2011  ? Other B-complex deficiencies 08/16/2010  ? Family history of cerebrovascular accident (CVA) 04/17/2010  ? Family history of malignant neoplasm of gastrointestinal tract 04/17/2010  ? Family history of other cardiovascular diseases(V17.49) 04/17/2010  ? ?Past Medical History:  ?Diagnosis Date  ? Allergy   ? Anemia   ? history of  ? Anxiety   ? Arthritis   ? Asthma   ? Atherosclerosis   ? Depression   ? Family history of adverse reaction to anesthesia   ? pt's mother felt everything and couldn't speak during a gallbladder surgery  ? GERD (gastroesophageal reflux disease)   ? History of hiatal hernia   ? History of kidney stones   ? Hypertension   ? Pneumonia   ? a few times  ? TIA (transient ischemic attack) 2012  ?  ?Family History  ?Problem Relation Age of Onset  ? Anxiety disorder Mother   ? Depression Mother   ? Heart disease Mother   ? Hypertension Mother   ? Arrhythmia Mother   ? Cancer Father   ?     colon, age greater than 3260  ? Lung cancer Father   ? Migraines Sister   ? Cancer Daughter   ?     nerve sheath sarcoma  ? Alcohol abuse Brother   ? Breast cancer Neg Hx   ?  ?Past Surgical History:  ?Procedure Laterality Date  ? ABDOMINAL HYSTERECTOMY    ? abdominal  ? APPENDECTOMY    ? bladder tack    ? CARPAL TUNNEL RELEASE Bilateral   ? CERVICAL  SPINE SURGERY    ? 5-6, 6-7  ? CHOLECYSTECTOMY    ? COLONOSCOPY WITH PROPOFOL N/A 07/10/2020  ? Procedure: COLONOSCOPY WITH PROPOFOL;  Surgeon: Lanelle Balarver, Charles K, DO;  Location: [facility];  Service: Endoscopy;  Laterality: N/A;  12:15pm  ? HAMMER TOE SURGERY Right   ? JOINT REPLACEMENT    ? TOTAL HIP 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  ? Procedure: LEFT TOTAL HIP ARTHROPLASTY -DIRECT ANTERIOR;  Surgeon: Eldred MangesYates, Jordi Kamm C, MD;  [facility];  Service: Orthopedics;  Laterality: Left;  ? TOTAL HIP ARTHROPLASTY Right 12/05/2019  ? Procedure: RIGHT TOTAL HIP ARTHROPLASTY ANTERIOR APPROACH  DIRECT ANTERIOR;  Surgeon: Annell GreeningYates, Amilliana Hayworth  C, MD;  Location: [facility] OR;  Service: Orthopedics;  Laterality: Right;  ? ?Social History  ? ?Occupational History  ? Not on file  ?Tobacco Use  ? Smoking status: Every Day  ?  Packs/day: 0.50  ?  Years: 23.00  ?  Pack years: 11.50  ?  Types: Cigarettes  ? Smokeless tobacco: Never  ?Vaping Use  ? Vaping Use: Never used  ?Substance and Sexual Activity  ? Alcohol use: Yes  ?  Comment: occ  ? Drug use: Never  ? Sexual activity: Not Currently  ? ? ? ? ? ? ?

## 2021-10-08 ENCOUNTER — Other Ambulatory Visit: Payer: Self-pay | Admitting: *Deleted

## 2021-10-08 DIAGNOSIS — I83812 Varicose veins of left lower extremities with pain: Secondary | ICD-10-CM

## 2021-10-18 ENCOUNTER — Other Ambulatory Visit: Payer: Self-pay | Admitting: Family Medicine

## 2021-10-18 DIAGNOSIS — I10 Essential (primary) hypertension: Secondary | ICD-10-CM

## 2021-10-18 NOTE — Telephone Encounter (Signed)
[facility] NTBS for chronic fu. Mail order sent ?

## 2021-10-21 NOTE — Telephone Encounter (Signed)
PT SCHEDULED FOR 11/19/2021 WITH DR. Reece Agar. FOR MED REFILL      ?

## 2021-11-14 ENCOUNTER — Ambulatory Visit (INDEPENDENT_AMBULATORY_CARE_PROVIDER_SITE_OTHER): Payer: No Typology Code available for payment source | Admitting: Orthopaedic Surgery

## 2021-11-14 ENCOUNTER — Encounter: Payer: Self-pay | Admitting: Orthopaedic Surgery

## 2021-11-14 DIAGNOSIS — M7541 Impingement syndrome of right shoulder: Secondary | ICD-10-CM

## 2021-11-18 NOTE — Progress Notes (Signed)
? ?Office Visit Note ?  ?Patient: Sydney [name]           ?Date of Birth: 18-Jul-1961           ?MRN: [id number] ?Visit Date: 11/14/2021 ?             ?Requested by: Janora Norlander, DO ?[address] [place],  [place] [address] ?PCP: Janora Norlander, DO ? ? ?Assessment & Plan: ?Visit Diagnoses:  ?1. Impingement syndrome of right shoulder   ? ? ?Plan: Patient's MRI is reviewed she does have rotator cuff tendinosis but no full-thickness tear.  She does have some partial-thickness tearing and high-grade partial and full-thickness cartilage loss throughout the humeral head.  I discussed with her that shoulder arthroplasty versus reverse total shoulder arthroplasty would be an option in we will have her make an appointment with Dr. Marlou Sa so they can review this.  She can continue the Advil make sure she takes it with food.  She did not have any rotator cuff atrophy on MRI scan from 08/05/2021. ? ?Follow-Up Instructions: No follow-ups on file.  ? ?Orders:  ?Orders Placed This Encounter  ?Procedures  ? Ambulatory referral to Orthopedic Surgery  ? ?No orders of the defined types were placed in this encounter. ? ? ? ? Procedures: ?No procedures performed ? ? ?Clinical Data: ?No additional findings. ? ? ?Subjective: ?Chief Complaint  ?Patient presents with  ? Right Shoulder - Pain  ? ? ?HPI 61 year old female returns for follow-up for right shoulder pain.  She has known osteoarthritis and just finished physical therapy yesterday.  She still taking Advil still has pain with range of motion discomfort aching pain and problems with her shoulder with activities of daily living. ? ?Review of Systems previous left hip arthroplasty.  Right hip muscle moderate osteoarthritis. ? ? ?Objective: ?Vital Signs: There were no vitals taken for this visit. ? ?Physical Exam ?Constitutional:   ?   Appearance: She is well-developed.  ?HENT:  ?   Head: Normocephalic.  ?   Right Ear: External ear normal.  ?   Left Ear: External ear normal. There  is no impacted cerumen.  ?Eyes:  ?   Pupils: Pupils are equal, round, and reactive to light.  ?Neck:  ?   Thyroid: No thyromegaly.  ?   Trachea: No tracheal deviation.  ?Cardiovascular:  ?   Rate and Rhythm: Normal rate.  ?Pulmonary:  ?   Effort: Pulmonary effort is normal.  ?Abdominal:  ?   Palpations: Abdomen is soft.  ?Musculoskeletal:  ?   Cervical back: No rigidity.  ?Skin: ?   General: Skin is warm and dry.  ?Neurological:  ?   Mental Status: She is alert and oriented to person, place, and time.  ?Psychiatric:     ?   Behavior: Behavior normal.  ? ? ?Ortho Exam patient has pain with range of motion right shoulder.  Pain with internal rotation and posterior axillary line. ? ?Specialty Comments:  ?No specialty comments available. ? ?Imaging: ?Narrative & Impression  ?CLINICAL DATA:  Right shoulder pain. Rotator cuff disorder ?suspected. Chronic. ?  ?EXAM: ?MRI OF THE RIGHT SHOULDER WITHOUT CONTRAST ?  ?TECHNIQUE: ?Multiplanar, multisequence MR imaging of the shoulder was performed. ?No intravenous contrast was administered. ?  ?COMPARISON:  Right shoulder radiographs 06/18/2021 MRI right ?shoulder 06/20/2020 ?  ?FINDINGS: ?Rotator cuff: Moderate anterior supraspinatus tendinosis. Linear ?horizontal fluid bright signal within the mid AP dimension of the ?supraspinatus tendon footprint measuring up to 4 mm  in transverse ?dimension and 5 mm in AP dimension (coronal image 13, sagittal image ?8), a small partial-thickness midsubstance tear without tendon ?retraction. Mild intermediate T2 signal tendinosis of the bursal ?aspect of the infraspinatus. Mild superior subscapularis tendinosis ?and interstitial partial-thickness tearing, similar to prior. The ?teres minor is intact. ?  ?Muscles: No rotator cuff muscle atrophy, fatty infiltration, or ?edema. ?  ?Biceps long head: Mild proximal long head of the biceps intermediate ?T2 signal tendinosis. ?  ?Acromioclavicular Joint: There are mild-to-moderate  degenerative ?changes of the acromioclavicular joint including joint space ?narrowing, subchondral marrow edema, and peripheral osteophytosis. ?Type II acromion. Mild fluid within the subacromial/subdeltoid ?bursa, mildly increased from prior. ?  ?Glenohumeral Joint: Severe posterior glenoid cartilage thinning with ?mild-to-moderate subchondral marrow edema/cystic change. High-grade ?partial and full-thickness cartilage loss throughout the humeral ?head. Mild inferior humeral head-neck junction degenerative ?osteophytosis. ?  ?Labrum: High-grade attenuation and degenerative irregularity of the ?posterior glenoid labrum. ?  ?Bones:  No acute fracture. ?  ?Other: None. ?  ?IMPRESSION: ?1. Moderate anterior supraspinatus tendinosis. Mid AP supraspinatus ?small horizontal linear midsubstance partial-thickness tear. No ?tendon retraction. ?2. Mild superior subscapularis tendinosis and interstitial ?partial-thickness tearing, similar to prior. ?3. Mild proximal long head of the biceps tendinosis. ?4. Mild-to-moderate degenerative changes of the acromioclavicular ?joint. ?5. Moderate to severe glenohumeral cartilage degenerative changes, ?similar to prior. ?  ?  ?Electronically Signed ?  By: Yvonne Kendall M.D. ?  On: 08/05/2021 13:23 ?   ? ? ? ?PMFS History: ?Patient Active Problem List  ? Diagnosis Date Noted  ? Peripheral vascular disease with stasis dermatitis 09/03/2021  ? Urine frequency 05/10/2021  ? Cellulitis of left leg 01/18/2021  ? Rash 01/18/2021  ? History of bilateral hip arthroplasty 01/03/2021  ? Upper respiratory tract infection 12/30/2020  ? Non-recurrent acute serous otitis media of right ear 12/30/2020  ? Pharyngitis 10/29/2020  ? Closed fracture of multiple pubic rami, left, sequela 08/16/2020  ? H/O adenomatous polyp of colon 05/23/2020  ? FH: colon cancer in relative diagnosed at >85 years old 05/23/2020  ? Spinal stenosis of lumbar region 05/03/2020  ? Impingement syndrome of right shoulder  05/03/2020  ? H/O total hip arthroplasty 12/06/2019  ? Status post total replacement of left hip 09/15/2019  ? Back pain 07/01/2018  ? DDD (degenerative disc disease), lumbosacral 07/01/2018  ? Venous insufficiency of both lower extremities 11/17/2016  ? 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 level with radiculopathy 08/18/2016  ? Morbid obesity with BMI of 40.0-44.9, adult ([place]) 07/28/2016  ? Chronic pain syndrome 10/24/2015  ? Dysfunction of both eustachian tubes 07/26/2014  ? Calculus of left kidney 04/25/2014  ? Impaired fasting glucose 04/05/2014  ? History of bladder surgery 04/03/2014  ? History of hysterectomy 04/03/2014  ? Urge incontinence 04/03/2014  ? Asthma 07/28/2013  ? Nontoxic uninodular goiter 07/28/2013  ? Solitary pulmonary nodule 07/28/2013  ? Lumbar radiculopathy 04/20/2013  ? Insomnia 03/22/2013  ? Essential hypertension 08/18/2011  ? Tobacco use disorder 08/18/2011  ? Moderate episode of recurrent major depressive disorder ([place]) 04/03/2011  ? Other B-complex deficiencies 08/16/2010  ? Family history of cerebrovascular accident (CVA) 04/17/2010  ? Family history of malignant neoplasm of gastrointestinal tract 04/17/2010  ? Family history of other cardiovascular diseases(V17.49) 04/17/2010  ? ?Past Medical History:  ?Diagnosis Date  ? Allergy   ? Anemia   ? history of  ? Anxiety   ? Arthritis   ? Asthma   ? Atherosclerosis   ? Depression   ?  Family history of adverse reaction to anesthesia   ? pt's mother felt everything and couldn't speak during a gallbladder surgery  ? GERD (gastroesophageal reflux disease)   ? History of hiatal hernia   ? History of kidney stones   ? Hypertension   ? Pneumonia   ? a few times  ? TIA (transient ischemic attack) 2012  ?  ?Family History  ?Problem Relation Age of Onset  ? Anxiety disorder Mother   ? Depression Mother   ? Heart disease Mother   ? Hypertension Mother   ? Arrhythmia Mother   ? Cancer Father   ?     colon, age greater than 24  ? Lung cancer Father   ?  Migraines Sister   ? Cancer Daughter   ?     nerve sheath sarcoma  ? Alcohol abuse Brother   ? Breast cancer Neg Hx   ?  ?Past Surgical History:  ?Procedure Laterality Date  ? ABDOMINAL HYSTERECTOMY    ? abdominal  ? APPENDECTOMY

## 2021-11-19 ENCOUNTER — Encounter: Payer: Self-pay | Admitting: Family Medicine

## 2021-11-19 ENCOUNTER — Other Ambulatory Visit: Payer: Self-pay | Admitting: Surgery

## 2021-11-19 ENCOUNTER — Ambulatory Visit: Payer: No Typology Code available for payment source | Admitting: Family Medicine

## 2021-11-19 VITALS — BP 120/80 | HR 89 | Temp 96.7°F | Ht 65.0 in | Wt 235.8 lb

## 2021-11-19 DIAGNOSIS — M5137 Other intervertebral disc degeneration, lumbosacral region: Secondary | ICD-10-CM

## 2021-11-19 DIAGNOSIS — G8929 Other chronic pain: Secondary | ICD-10-CM

## 2021-11-19 DIAGNOSIS — I878 Other specified disorders of veins: Secondary | ICD-10-CM | POA: Diagnosis not present

## 2021-11-19 DIAGNOSIS — F411 Generalized anxiety disorder: Secondary | ICD-10-CM

## 2021-11-19 DIAGNOSIS — M5442 Lumbago with sciatica, left side: Secondary | ICD-10-CM

## 2021-11-19 DIAGNOSIS — M25511 Pain in right shoulder: Secondary | ICD-10-CM

## 2021-11-19 DIAGNOSIS — I10 Essential (primary) hypertension: Secondary | ICD-10-CM | POA: Diagnosis not present

## 2021-11-19 MED ORDER — HYDROXYZINE PAMOATE 25 MG PO CAPS: 25.0000 mg | ORAL_CAPSULE | Freq: Three times a day (TID) | ORAL | 1 refills | Status: DC | PRN

## 2021-11-19 MED ORDER — HYDROCODONE-ACETAMINOPHEN 5-325 MG PO TABS: 1.0000 | ORAL_TABLET | ORAL | 0 refills | Status: DC | PRN

## 2021-11-19 MED ORDER — FUROSEMIDE 20 MG PO TABS: 20.0000 mg | ORAL_TABLET | Freq: Every day | ORAL | 3 refills | Status: DC | PRN

## 2021-11-19 NOTE — Progress Notes (Unsigned)
k

## 2021-11-19 NOTE — Progress Notes (Signed)
? ?Subjective: ?CC: Chronic follow-up ?PCP: Raliegh Ip, DO ?URK:YHCW Steveson is a 61 y.o. female presenting to clinic today for: ? ?1.  Venous stasis ?Patient reports that she is scheduled for a vascular procedure on the left leg on the 11th.  She continues to use the Lasix daily for fluid.  Needs a renewal on this sent to her mail order ? ?2.  Chronic shoulder pain ?Patient with chronic right-sided shoulder pain.  She will be seeing the surgeon, Dr. August Saucer, for assessment on the 10th.  She was referred by Dr. Ophelia Charter for surgical consideration to [facility] office.  She is hopeful that she will have some type of surgery scheduled soon as she has been noticing increased tightness along the neck and sometimes even gets a little bit of numbness along the right ear.  She wonders if it is because of so much spasm.  Notes it is very intermittent.  Pain is not severe today but she would like to have Norco on hand for severe flareups she continues to have some back issues as well.  Last refill was in May 2022.  Using it very sparingly ? ?3.  Anxiety ?Patient reports anxiety has been exacerbated.  She is compliant with Zoloft 150 mg daily and uses trazodone as needed sleep but continues to have breakthrough panic.  Sometimes she feels like her chest is tight. ? ? ? ?ROS: Per HPI ? ?Allergies  ?Allergen Reactions  ? Bee Pollen Anaphylaxis and Swelling  ? Bee Venom Anaphylaxis and Swelling  ? Butorphanol Other (See Comments)  ?  Not sure ?told by md she was allergic after surgery. ?  ? Meloxicam Anxiety  ?  Mood disorder ?Altered her personality ?  ? Penicillins Anaphylaxis and Hives  ?  Unable to Recall ?As child mom was told she is highly allergic ?Did it involve swelling of the face/tongue/throat, SOB, or low BP? Unknown ?Did it involve sudden or severe rash/hives, skin peeling, or any reaction on the inside of your mouth or nose? Unknown ?Did you need to seek medical attention at a hospital or doctor's office?  Unknown ?When did it last happen?      childhood allergy ?If all above answers are ?NO?, may proceed with cephalosporin use. ?  ? Prochlorperazine Edisylate Anaphylaxis  ? Sulfa Antibiotics Anaphylaxis  ?  Unable to Recall  ? Tramadol Anxiety  ?  Didn't like the way it made her feel ?  ? Clindamycin Hives  ? Levaquin [Levofloxacin] Itching  ? Topiramate Nausea And Vomiting  ? Doxycycline Dermatitis, Hives, Itching, Photosensitivity and Rash  ? ?Past Medical History:  ?Diagnosis Date  ? Allergy   ? Anemia   ? history of  ? Anxiety   ? Arthritis   ? Asthma   ? Atherosclerosis   ? Depression   ? Family history of adverse reaction to anesthesia   ? pt's mother felt everything and couldn't speak during a gallbladder surgery  ? GERD (gastroesophageal reflux disease)   ? History of hiatal hernia   ? History of kidney stones   ? Hypertension   ? Pneumonia   ? a few times  ? TIA (transient ischemic attack) 2012  ? ? ?Current Outpatient Medications:  ?  albuterol (VENTOLIN HFA) 108 (90 Base) MCG/ACT inhaler, Inhale 2 puffs into the lungs every 6 (six) hours as needed for wheezing or shortness of breath., Disp: , Rfl:  ?  amLODipine (NORVASC) 5 MG tablet, TAKE 1 TABLET DAILY, Disp: 90  tablet, Rfl: 0 ?  augmented betamethasone dipropionate (DIPROLENE-AF) 0.05 % cream, Apply topically., Disp: , Rfl:  ?  cetirizine (ZYRTEC) 10 MG tablet, Take 10 mg by mouth daily., Disp: , Rfl:  ?  furosemide (LASIX) 20 MG tablet, Take 20 mg by mouth daily., Disp: , Rfl:  ?  HYDROcodone-acetaminophen (NORCO/VICODIN) 5-325 MG tablet, Take 1 tablet by mouth every 4 (four) hours as needed for severe pain., Disp: 30 tablet, Rfl: 0 ?  metoprolol succinate (TOPROL-XL) 25 MG 24 hr tablet, TAKE 1 TABLET DAILY, Disp: 90 tablet, Rfl: 0 ?  rosuvastatin (CRESTOR) 10 MG tablet, Take 10 mg by mouth every other day., Disp: , Rfl:  ?  sertraline (ZOLOFT) 100 MG tablet, Take 1.5 tablets (150 mg total) by mouth daily., Disp: 90 tablet, Rfl: 3 ?  traZODone  (DESYREL) 100 MG tablet, TAKE 1 TO 2 TABLETS AT     BEDTIME AS NEEDED FOR SLEEP, Disp: 180 tablet, Rfl: 1 ?Social History  ? ?Socioeconomic History  ? Marital status: Married  ?  Spouse name: Not on file  ? Number of children: 3  ? Years of education: Not on file  ? Highest education level: Not on file  ?Occupational History  ? Not on file  ?Tobacco Use  ? Smoking status: Every Day  ?  Packs/day: 0.50  ?  Years: 23.00  ?  Pack years: 11.50  ?  Types: Cigarettes  ? Smokeless tobacco: Never  ?Vaping Use  ? Vaping Use: Never used  ?Substance and Sexual Activity  ? Alcohol use: Yes  ?  Comment: occ  ? Drug use: Never  ? Sexual activity: Not Currently  ?Other Topics Concern  ? Not on file  ?Social History Narrative  ? [place]  She resides at home with her husband.  ? She has 3 children but 1 passed away from cancer.  ? ?Social Determinants of Health  ? ?Financial Resource Strain: Not on file  ?Food Insecurity: Not on file  ?Transportation Needs: Not on file  ?Physical Activity: Not on file  ?Stress: Not on file  ?Social Connections: Not on file  ?Intimate Partner Violence: Not on file  ? ?Family History  ?Problem Relation Age of Onset  ? Anxiety disorder Mother   ? Depression Mother   ? Heart disease Mother   ? Hypertension Mother   ? Arrhythmia Mother   ? Cancer Father   ?     colon, age greater than 6560  ? Lung cancer Father   ? Migraines Sister   ? Cancer Daughter   ?     nerve sheath sarcoma  ? Alcohol abuse Brother   ? Breast cancer Neg Hx   ? ? ?Objective: ?Office vital signs reviewed. ?BP 120/80   Pulse 89   Temp (!) 96.7 ?F (35.9 ?C)   Ht 5\' 5"  (1.651 m)   Wt 235 lb 12.8 oz (107 kg)   SpO2 98%   BMI 39.24 kg/m?  ? ?Physical Examination:  ?General: Awake, alert, nontoxic-appearing female, No acute distress ?HEENT: Sclera white.  Moist mucous membranes ?Cardio: regular rate and rhythm, S1S2 heard, no murmurs appreciated ?Pulm: clear to auscultation bilaterally, no  wheezes, rhonchi or rales; normal work of breathing on room air ?MSK: Normal gait and station; ambulating independently ?Psych: Very pleasant, interactive.  Occasionally seems stressed during conversation ? ? ?  11/19/2021  ? 11:39 AM 08/29/2021  ?  3:56 PM 08/28/2021  ? 12:05 PM  ?  Depression screen PHQ 2/9  ?Decreased Interest 1 1 1   ?Down, Depressed, Hopeless 1 1 1   ?PHQ - 2 Score 2 2 2   ?Altered sleeping 1 1 1   ?Tired, decreased energy 1 1 1   ?Change in appetite 1 1 1   ?Feeling bad or failure about yourself  1 1 1   ?Trouble concentrating 0 1 1  ?Moving slowly or fidgety/restless 0 1 1  ?Suicidal thoughts 0 1 1  ?PHQ-9 Score 6 9 9   ?Difficult doing work/chores Somewhat difficult Somewhat difficult Somewhat difficult  ? ? ?  11/19/2021  ? 11:40 AM 08/29/2021  ?  3:56 PM 08/28/2021  ? 12:06 PM 07/25/2021  ?  2:22 PM  ?GAD 7 : Generalized Anxiety Score  ?Nervous, Anxious, on Edge 3 1 1 1   ?Control/stop worrying 2 1 1 1   ?Worry too much - different things 2 1 1 1   ?Trouble relaxing 2 1 1 1   ?Restless 1 1 1  0  ?Easily annoyed or irritable 1 1 1 1   ?Afraid - awful might happen 3 1 1 1   ?Total GAD 7 Score 14 7 7 6   ?Anxiety Difficulty Somewhat difficult Somewhat difficult Somewhat difficult   ? ? ? ? ?Assessment/ Plan: ?61 y.o. female  ? ?Essential hypertension - Plan: furosemide (LASIX) 20 MG tablet ? ?Venous stasis - Plan: furosemide (LASIX) 20 MG tablet ? ?Generalized anxiety disorder - Plan: hydrOXYzine (VISTARIL) 25 MG capsule ? ?DDD (degenerative disc disease), lumbosacral - Plan: HYDROcodone-acetaminophen (NORCO/VICODIN) 5-325 MG tablet ? ?Chronic left-sided low back pain with left-sided sciatica - Plan: HYDROcodone-acetaminophen (NORCO/VICODIN) 5-325 MG tablet ? ?Chronic right shoulder pain ? ?Blood pressure is controlled.  Lasix renewed. ? ?Anxiety disorder is not well controlled.  Vistaril as needed prescribed.  Caution sedation and dryness ? ?DDD and right shoulder pain with intermittent flareups.  Awaiting surgical  assessment for right shoulder.  Norco renewed for as needed use.  She uses this extremely sparingly.  If needing further renewals we will plan for controlled substance contract and urine drug screen in accordance with [place]

## 2021-11-20 ENCOUNTER — Other Ambulatory Visit: Payer: Self-pay | Admitting: *Deleted

## 2021-11-20 MED ORDER — LORAZEPAM 1 MG PO TABS: ORAL_TABLET | ORAL | 0 refills | Status: DC

## 2021-11-27 ENCOUNTER — Ambulatory Visit: Payer: Self-pay

## 2021-11-27 ENCOUNTER — Ambulatory Visit (INDEPENDENT_AMBULATORY_CARE_PROVIDER_SITE_OTHER): Payer: No Typology Code available for payment source | Admitting: Orthopedic Surgery

## 2021-11-27 DIAGNOSIS — M25511 Pain in right shoulder: Secondary | ICD-10-CM

## 2021-11-27 DIAGNOSIS — M19011 Primary osteoarthritis, right shoulder: Secondary | ICD-10-CM | POA: Diagnosis not present

## 2021-11-27 DIAGNOSIS — M5412 Radiculopathy, cervical region: Secondary | ICD-10-CM

## 2021-11-28 ENCOUNTER — Encounter: Payer: Self-pay | Admitting: Orthopedic Surgery

## 2021-11-28 ENCOUNTER — Encounter: Payer: Self-pay | Admitting: Surgery

## 2021-11-28 ENCOUNTER — Ambulatory Visit (INDEPENDENT_AMBULATORY_CARE_PROVIDER_SITE_OTHER): Payer: No Typology Code available for payment source | Admitting: Surgery

## 2021-11-28 VITALS — BP 132/79 | HR 66 | Temp 98.2°F | Resp 18 | Ht 65.5 in | Wt 232.0 lb

## 2021-11-28 DIAGNOSIS — I83812 Varicose veins of left lower extremities with pain: Secondary | ICD-10-CM | POA: Diagnosis not present

## 2021-11-28 HISTORY — PX: ENDOVENOUS ABLATION SAPHENOUS VEIN W/ LASER: SUR449

## 2021-11-28 MED ORDER — METHYLPREDNISOLONE ACETATE 40 MG/ML IJ SUSP
13.3300 mg | INTRAMUSCULAR | Status: AC | PRN
  Administered 2021-11-27: 13.33 mg via INTRA_ARTICULAR

## 2021-11-28 MED ORDER — LIDOCAINE HCL 1 % IJ SOLN
5.0000 mL | INTRAMUSCULAR | Status: AC | PRN
  Administered 2021-11-27: 5 mL

## 2021-11-28 MED ORDER — LIDOCAINE HCL 1 % IJ SOLN
3.0000 mL | INTRAMUSCULAR | Status: AC | PRN
  Administered 2021-11-27: 3 mL

## 2021-11-28 MED ORDER — BUPIVACAINE HCL 0.5 % IJ SOLN
9.0000 mL | INTRAMUSCULAR | Status: AC | PRN
  Administered 2021-11-27: 9 mL via INTRA_ARTICULAR

## 2021-11-28 MED ORDER — BUPIVACAINE HCL 0.25 % IJ SOLN
0.6600 mL | INTRAMUSCULAR | Status: AC | PRN
  Administered 2021-11-27: .66 mL via INTRA_ARTICULAR

## 2021-11-28 MED ORDER — METHYLPREDNISOLONE ACETATE 40 MG/ML IJ SUSP
40.0000 mg | INTRAMUSCULAR | Status: AC | PRN
  Administered 2021-11-27: 40 mg via INTRA_ARTICULAR

## 2021-11-28 NOTE — Progress Notes (Signed)
? ?Office Visit Note ?  ?Patient: Sydney [name]           ?Date of Birth: 03/10/1961           ?MRN: [id number] ?Visit Date: 11/27/2021 ?Requested by: Sydney [name] ?[address] ?[place],  [place] [id number] ?PCP: Sydney [name] ? ?Subjective: ?Chief Complaint  ?Patient presents with  ? Right Shoulder - Pain  ? ? ?HPI: Sydney StanleyLisa is a [age] year old patient with right shoulder pain.  She is right-hand dominant.  She has shoulder pain and arm pain which wakes her from sleep at night.  Describes decreased range of motion and difficulty with ADLs.  The pain does radiate from the lateral aspect of the arm down to the forearm.  She cannot sleep on the right-hand side.  The pain radiates to her neck and scapula as well.  Denies much in the way of numbness and tingling.  She had an MRI scan of her shoulder in January which is reviewed.  That scan shows some rotator cuff tendinosis in the supraspinatus and subscap.  Mild biceps tendinosis.  Moderate AC joint degenerative changes.  Also moderate to severe glenohumeral cartilage changes. ? ?Patient also describes having C5-6 and C6-7 neck fusion performed in [place] approximately 7 years ago.  She had that performed for right arm radiculopathy.  She reports that weakness is a bigger problem than stiffness in the shoulder. ?             ?ROS: All systems reviewed are negative as they relate to the chief complaint within the history of present illness.  Patient denies  fevers or chills. ? ? ?Assessment & Plan: ?Visit Diagnoses:  ?1. Acute pain of right shoulder   ?2. Radiculopathy, cervical region   ? ? ?Plan: Impression is right shoulder and arm pain which may have a component of both glenohumeral joint arthritis as well as a radiculopathy.  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  She also has glenohumeral degenerative changes but no real rotator cuff pathology to explain her subjective weakness.  Objectively she has fairly  reasonable strength.  She also has some paresthesias in the C5 distribution on the right compared to the left.  Plan today is [facility]guided [facility] as well as glenohumeral joint.  MRI cervical spine to evaluate right-sided C5 radiculopathy.  We can make plans for surgical intervention on the shoulder pending work-up of the cervical spine.  Follow-up after that study. ? ?Follow-Up Instructions: Return for after MRI.  ? ?Orders:  ?Orders Placed This Encounter  ?Procedures  ? US Guided Needle Placement - No Linked Charges  ? MR Cervical Spine w/o contrast  ? ?No orders of the defined types were placed in this encounter. ? ? ? ? Procedures: ?Medium Joint Inj on 11/27/2021 7:32 AM ?Indications: pain and diagnostic evaluation ?Details: 27 G 1.5 in needle, ultrasound-guided superior approach ?Medications: 13.33 mg methylPREDNISolone acetate 40 MG/ML; 0.66 mL bupivacaine 0.25 %; 3 mL lidocaine 1 % ?Outcome: tolerated well, no immediate complications ?Procedure, treatment alternatives, risks and benefits explained, specific risks discussed. Consent was given by the patient. Immediately prior to procedure a time out was called to verify the correct patient, procedure, equipment, support staff and site/side marked as required. Patient was prepped and draped in the usual sterile fashion.  ? ? ?Large Joint Inj on 11/27/2021 7:33 AM ?Indications: diagnostic evaluation and pain ?Details: 18 G 1.5 in  needle, posterior approach ? ?Arthrogram: No ? ?Medications: 9 mL bupivacaine 0.5 %; 40 mg methylPREDNISolone acetate 40 MG/ML; 5 mL lidocaine 1 % ?Outcome: tolerated well, no immediate complications ?Procedure, treatment alternatives, risks and benefits explained, specific risks discussed. Consent was given by the patient. Immediately prior to procedure a time out was called to verify the correct patient, procedure, equipment, support staff and site/side marked as required. Patient was prepped and draped in the  usual sterile fashion.  ? ? ? ? ?Clinical Data: ?No additional findings. ? ?Objective: ?Vital Signs: There were no vitals taken for this visit. ? ?Physical Exam:  ? ?Constitutional: Patient appears well-developed ?HEENT:  ?Head: Normocephalic ?Eyes:EOM are normal ?Neck: Normal range of motion ?Cardiovascular: Normal rate ?Pulmonary/chest: Effort normal ?Neurologic: Patient is alert ?Skin: Skin is warm ?Psychiatric: Patient has normal mood and affect ? ? ?Ortho Exam: Ortho exam demonstrates reasonable cervical spine range of motion with flexion chin to chest extension 35 degrees rotation is about 50 degrees bilaterally.  Patient has 5 out of 5 grip EPL FPL interosseous wrist flexion wrist extension bicep triceps and deltoid strength.  Has symmetric infraspinatus supraspinatus and subscap strength the manual motor testing.  Range of motion on the right is 60/90/160.  Range of motion on the left is 60/110/170.  Reflexes symmetric biceps and triceps 0 to 1+ out of 4.  Asymmetric paresthesias present in the C5 distribution right versus left. ? ?Specialty Comments:  ?No specialty comments available. ? ?Imaging: ?No results found. ? ? ?PMFS History: ?Patient Active Problem List  ? Diagnosis Date Noted  ? Peripheral vascular disease with stasis dermatitis 09/03/2021  ? Urine frequency 05/10/2021  ? Cellulitis of left leg 01/18/2021  ? Rash 01/18/2021  ? History of bilateral hip arthroplasty 01/03/2021  ? Upper respiratory tract infection 12/30/2020  ? Non-recurrent acute serous otitis media of right ear 12/30/2020  ? Pharyngitis 10/29/2020  ? Closed fracture of multiple pubic rami, left, sequela 08/16/2020  ? H/O adenomatous polyp of colon 05/23/2020  ? FH: colon cancer in relative diagnosed at >46 years old 05/23/2020  ? Spinal stenosis of lumbar region 05/03/2020  ? Impingement syndrome of right shoulder 05/03/2020  ? H/O total hip arthroplasty 12/06/2019  ? Status post total replacement of left hip 09/15/2019  ? Back  pain 07/01/2018  ? DDD (degenerative disc disease), lumbosacral 07/01/2018  ? Venous insufficiency of both lower extremities 11/17/2016  ? 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 level with radiculopathy 08/18/2016  ? Morbid obesity with BMI of 40.0-44.9, adult (HCC) 07/28/2016  ? Chronic pain syndrome 10/24/2015  ? Dysfunction of both eustachian tubes 07/26/2014  ? Calculus of left kidney 04/25/2014  ? Impaired fasting glucose 04/05/2014  ? History of bladder surgery 04/03/2014  ? History of hysterectomy 04/03/2014  ? Urge incontinence 04/03/2014  ? Asthma 07/28/2013  ? Nontoxic uninodular goiter 07/28/2013  ? Solitary pulmonary nodule 07/28/2013  ? Lumbar radiculopathy 04/20/2013  ? Insomnia 03/22/2013  ? Essential hypertension 08/18/2011  ? Tobacco use disorder 08/18/2011  ? Moderate episode of recurrent major depressive disorder (HCC) 04/03/2011  ? Other B-complex deficiencies 08/16/2010  ? Family history of cerebrovascular accident (CVA) 04/17/2010  ? Family history of malignant neoplasm of gastrointestinal tract 04/17/2010  ? Family history of other cardiovascular diseases(V17.49) 04/17/2010  ? ?Past Medical History:  ?Diagnosis Date  ? Allergy   ? Anemia   ? history of  ? Anxiety   ? Arthritis   ? Asthma   ? Atherosclerosis   ?  Depression   ? Family history of adverse reaction to anesthesia   ? pt's mother felt everything and couldn't speak during a gallbladder surgery  ? GERD (gastroesophageal reflux disease)   ? History of hiatal hernia   ? History of kidney stones   ? Hypertension   ? Pneumonia   ? a few times  ? TIA (transient ischemic attack) 2012  ?  ?Family History  ?Problem Relation Age of Onset  ? Anxiety disorder Mother   ? Depression Mother   ? Heart disease Mother   ? Hypertension Mother   ? Arrhythmia Mother   ? Cancer Father   ?     colon, age greater than 23  ? Lung cancer Father   ? Migraines Sister   ? Cancer Daughter   ?     nerve sheath sarcoma  ? Alcohol abuse Brother   ? Breast cancer Neg Hx   ?   ?Past Surgical History:  ?Procedure Laterality Date  ? ABDOMINAL HYSTERECTOMY    ? abdominal  ? APPENDECTOMY    ? bladder tack    ? CARPAL TUNNEL RELEASE Bilateral   ? CERVICAL SPINE SURGERY    ? 5-6, 6-7  ?

## 2021-11-28 NOTE — Progress Notes (Signed)
? ? ?   Laser Ablation Procedure     ?Date: 11/28/2021 ? ? ?Sydney [name] DOB:[birth year] ? ?Consent signed: Yes     ? ?Surgeon: Coral Else MD  ? ?Procedure: Laser Ablation: left Greater Saphenous Vein ? ?BP 132/79 (BP Location: Left Arm, Patient Position: Sitting, Cuff Size: Large)   Pulse 66   Temp 98.2 ?F (36.8 ?C) (Temporal)   Resp 18   Ht 5' 5.5" (1.664 m)   Wt 232 lb (105.2 kg)   SpO2 93%   BMI 38.02 kg/m?  ? ?Tumescent Anesthesia: 500 cc 0.9% NaCl with 50 cc Lidocaine HCL 1%  and 15 cc 8.4% NaHCO3 ? ?Local Anesthesia: 4 cc Lidocaine HCL and NaHCO3 (ratio 2:1) ? ?7 watts continuous mode ?    ?Total energy: 1354 Joules     ?Total time: 193 seconds  ?Treatment Length  29 cm  ? ?Laser Fiber Ref. #  [id number]        Lot #  [id number] ? ? ? ? ?Patient tolerated procedure well ? ?Notes: All staff members wore facial masks. Mrs. Salay took Ativan 1 mg on 11-28-2021 at 9:45 AM and at 10:40 AM.   ? ?Description of Procedure: ? ?After marking the course of the secondary varicosities, the patient was placed on the operating table in the supine position, and the left leg was prepped and draped in sterile fashion.   Local anesthetic was administered and under ultrasound guidance the saphenous vein was accessed with a micro needle and guide wire; then the mirco puncture sheath was placed.  A guide wire was inserted saphenofemoral junction , followed by a 5 french sheath.  The position of the sheath and then the laser fiber below the junction was confirmed using the ultrasound.  Tumescent anesthesia was administered along the course of the saphenous vein using ultrasound guidance. The patient was placed in Trendelenburg position and protective laser glasses were placed on patient and staff, and the laser was fired at 7 watts continuous mode for a total of 1354 joules. ? ? ? ? ?Steri strip was applied to the IV insertion site and ABD pads and thigh high compression stockings were applied.  Ace wrap bandages were applied  over the left thigh and at the top of the saphenofemoral junction. Blood loss was less than 15 cc.  Discharge instructions reviewed with patient and hardcopy of discharge instructions given to patient to take home. The patient ambulated out of the operating room having tolerated the procedure well.  ?

## 2021-12-08 ENCOUNTER — Other Ambulatory Visit: Payer: Self-pay | Admitting: Family Medicine

## 2021-12-08 DIAGNOSIS — F5104 Psychophysiologic insomnia: Secondary | ICD-10-CM

## 2021-12-10 ENCOUNTER — Encounter: Payer: Self-pay | Admitting: Family Medicine

## 2021-12-10 ENCOUNTER — Telehealth (INDEPENDENT_AMBULATORY_CARE_PROVIDER_SITE_OTHER): Payer: No Typology Code available for payment source | Admitting: Family Medicine

## 2021-12-10 DIAGNOSIS — F40240 Claustrophobia: Secondary | ICD-10-CM

## 2021-12-10 MED ORDER — LORAZEPAM 1 MG PO TABS: ORAL_TABLET | ORAL | 0 refills | Status: DC

## 2021-12-10 NOTE — Telephone Encounter (Signed)
Put her on for a video visit since it would be controlled and I can send in a refill on the Ativan.

## 2021-12-10 NOTE — Progress Notes (Signed)
MyChart Video visit  Subjective: UJ:[id number]:claustrophobia PCP: Sydney [name] HYQ:MVHQHPI:Sydney [name] is a 61 y.o. female. Patient provides verbal consent for consult held via video.  Due to COVID-19 pandemic this visit was conducted virtually. This visit type was conducted due to national recommendations for restrictions regarding the COVID-19 Pandemic (e.g. social distancing, sheltering in place) in an effort to limit this patient's exposure and mitigate transmission in our community. All issues noted in this document were discussed and addressed.  A physical exam was not performed with this format.   Location of patient: home Location of provider: [facility] Others present for call: granddaughter   Shoulder pain/ claustrophobia She has c-spine MRI scheduled tomorrow with Sydney [name].  S/p shoulder injection.  Needs sedative for MRI as she is claustrophobic.  Was told by ortho to call PCP for meds.  She has not tried the Atarax but will [name] so.  Has traditionally needed Ativan for sedation.  Used this recently with vascular procedure from Sydney [name].  Sydney NianKnows not to use norco.  ROS: Per HPI  Allergies  Allergen Reactions   Bee Pollen Anaphylaxis and Swelling   Bee Venom Anaphylaxis and Swelling   Butorphanol Other (See Comments)    Not sure told by md she was allergic after surgery.    Meloxicam Anxiety    Mood disorder Altered her personality    Penicillins Anaphylaxis and Hives    Unable to Recall As child mom was told she is highly allergic Did it involve swelling of the face/tongue/throat, SOB, or low BP? Unknown Did it involve sudden or severe rash/hives, skin peeling, or any reaction on the inside of your mouth or nose? Unknown Did you need to seek medical attention at a hospital or doctor's office? Unknown When did it last happen?      childhood allergy If all above answers are "NO", may proceed with cephalosporin use.    Prochlorperazine Edisylate Anaphylaxis   Sulfa Antibiotics  Anaphylaxis    Unable to Recall   Tramadol Anxiety    Didn't like the way it made her feel    Clindamycin Hives   Levaquin [Levofloxacin] Itching   Topiramate Nausea And Vomiting   Doxycycline Dermatitis, Hives, Itching, Photosensitivity and Rash   Past Medical History:  Diagnosis Date   Allergy    Anemia    history of   Anxiety    Arthritis    Asthma    Atherosclerosis    Depression    Family history of adverse reaction to anesthesia    pt's mother felt everything and couldn't speak during a gallbladder surgery   GERD (gastroesophageal reflux disease)    History of hiatal hernia    History of kidney stones    Hypertension    Pneumonia    a few times   TIA (transient ischemic attack) 2012    Current Outpatient Medications:    albuterol (VENTOLIN HFA) 108 (90 Base) MCG/ACT inhaler, Inhale 2 puffs into the lungs every 6 (six) hours as needed for wheezing or shortness of breath., Disp: , Rfl:    amLODipine (NORVASC) 5 MG tablet, TAKE 1 TABLET DAILY, Disp: 90 tablet, Rfl: 0   augmented betamethasone dipropionate (DIPROLENE-AF) 0.05 % cream, Apply topically., Disp: , Rfl:    cetirizine (ZYRTEC) 10 MG tablet, Take 10 mg by mouth daily., Disp: , Rfl:    furosemide (LASIX) 20 MG tablet, Take 1 tablet (20 mg total) by mouth daily as needed for edema., Disp: 90 tablet, Rfl: 3  HYDROcodone-acetaminophen (NORCO/VICODIN) 5-325 MG tablet, Take 1 tablet by mouth every 4 (four) hours as needed for severe pain., Disp: 30 tablet, Rfl: 0   hydrOXYzine (VISTARIL) 25 MG capsule, Take 1-2 capsules (25-50 mg total) by mouth every 8 (eight) hours as needed for anxiety., Disp: 60 capsule, Rfl: 1   LORazepam (ATIVAN) 1 MG tablet, Take 1 tablet 30 minutes prior to leaving house on day of office surgery.  Bring second tablet with you to office on day of office surgery., Disp: 2 tablet, Rfl: 0   metoprolol succinate (TOPROL-XL) 25 MG 24 hr tablet, TAKE 1 TABLET DAILY, Disp: 90 tablet, Rfl: 0    rosuvastatin (CRESTOR) 10 MG tablet, Take 10 mg by mouth every other day., Disp: , Rfl:    sertraline (ZOLOFT) 100 MG tablet, Take 1.5 tablets (150 mg total) by mouth daily., Disp: 90 tablet, Rfl: 3   traZODone (DESYREL) 100 MG tablet, TAKE 1 TO 2 TABLETS AT     BEDTIME AS NEEDED FOR SLEEP, Disp: 180 tablet, Rfl: 1  Gen: nontoxic female Psych: mood stable, speech normal.  Assessment/ Plan: 61 y.o. female   Claustrophobia - Plan: LORazepam (ATIVAN) 1 MG tablet  MRI scheduled for Sydney [name] 12/11/21.  She is going to try the Atarax first but Ativan sent to use if not helpful.  Avoid Norco if going to use Benzo.  The Narcotic Database has been reviewed.  There were no red flags.  Meds have been appropriate for respective procedures.  Start time: 3:06pm End time: 3:15pm  Total time spent on patient care (including video visit/ documentation): 9 minutes  [place], [place] [phone]

## 2021-12-11 ENCOUNTER — Ambulatory Visit
Admission: RE | Admit: 2021-12-11 | Discharge: 2021-12-11 | Disposition: A | Discharge status: Home / Self Care | Payer: No Typology Code available for payment source | Source: Ambulatory Visit | Attending: Orthopedic Surgery | Admitting: Orthopedic Surgery

## 2021-12-11 DIAGNOSIS — M5412 Radiculopathy, cervical region: Secondary | ICD-10-CM

## 2021-12-12 ENCOUNTER — Ambulatory Visit (INDEPENDENT_AMBULATORY_CARE_PROVIDER_SITE_OTHER): Payer: No Typology Code available for payment source | Admitting: Surgery

## 2021-12-12 ENCOUNTER — Ambulatory Visit (HOSPITAL_COMMUNITY)
Admission: RE | Admit: 2021-12-12 | Discharge: 2021-12-12 | Disposition: A | Discharge status: Home / Self Care | Payer: No Typology Code available for payment source | Source: Ambulatory Visit | Attending: Surgery | Admitting: Surgery

## 2021-12-12 ENCOUNTER — Encounter: Payer: Self-pay | Admitting: Surgery

## 2021-12-12 VITALS — BP 129/85 | HR 60 | Temp 98.3°F | Resp 16 | Ht 66.0 in | Wt 232.0 lb

## 2021-12-12 DIAGNOSIS — I83893 Varicose veins of bilateral lower extremities with other complications: Secondary | ICD-10-CM

## 2021-12-12 DIAGNOSIS — I83812 Varicose veins of left lower extremities with pain: Secondary | ICD-10-CM | POA: Insufficient documentation

## 2021-12-12 NOTE — Progress Notes (Signed)
Patient name: Sydney [name] MRN: [id number] DOB: 02-27-1961 Sex: female  REASON FOR VISIT:     Post op  HISTORY OF PRESENT ILLNESS:   Sydney [name] is a 61 y.o. female who returns today for her postoperative visit.  On 11/28/2021 she underwent endovenous laser ablation of the left great saphenous vein.  This was done for CEAP class II disease.  She has no complaints today.  CURRENT MEDICATIONS:    Current Outpatient Medications  Medication Sig Dispense Refill   albuterol (VENTOLIN HFA) 108 (90 Base) MCG/ACT inhaler Inhale 2 puffs into the lungs every 6 (six) hours as needed for wheezing or shortness of breath.     amLODipine (NORVASC) 5 MG tablet TAKE 1 TABLET DAILY 90 tablet 0   augmented betamethasone dipropionate (DIPROLENE-AF) 0.05 % cream Apply topically.     cetirizine (ZYRTEC) 10 MG tablet Take 10 mg by mouth daily.     furosemide (LASIX) 20 MG tablet Take 1 tablet (20 mg total) by mouth daily as needed for edema. 90 tablet 3   HYDROcodone-acetaminophen (NORCO/VICODIN) 5-325 MG tablet Take 1 tablet by mouth every 4 (four) hours as needed for severe pain. 30 tablet 0   hydrOXYzine (VISTARIL) 25 MG capsule Take 1-2 capsules (25-50 mg total) by mouth every 8 (eight) hours as needed for anxiety. 60 capsule 1   metoprolol succinate (TOPROL-XL) 25 MG 24 hr tablet TAKE 1 TABLET DAILY 90 tablet 0   rosuvastatin (CRESTOR) 10 MG tablet Take 10 mg by mouth every other day.     sertraline (ZOLOFT) 100 MG tablet Take 1.5 tablets (150 mg total) by mouth daily. 90 tablet 3   traZODone (DESYREL) 100 MG tablet TAKE 1 TO 2 TABLETS AT     BEDTIME AS NEEDED FOR SLEEP 180 tablet 1   LORazepam (ATIVAN) 1 MG tablet Take 1/2-1 tablet 30 minutes before MRI.  May repeat once if needed. (Patient not taking: Reported on 12/12/2021) 2 tablet 0   No current facility-administered medications for this visit.    REVIEW OF SYSTEMS:   [X]  denotes positive finding, [ ]  denotes  negative finding Cardiac  Comments:  Chest pain or chest pressure:    Shortness of breath upon exertion:    Short of breath when lying flat:    Irregular heart rhythm:    Constitutional    Fever or chills:      PHYSICAL EXAM:   Vitals:   12/12/21 1053  BP: 129/85  Pulse: 60  Resp: 16  Temp: 98.3 F (36.8 C)  TempSrc: Temporal  SpO2: 96%  Weight: 232 lb (105.2 kg)  Height: 5\' 6"  (1.676 m)    GENERAL: The patient is a well-nourished female, in no acute distress. The vital signs are documented above. CARDIOVASCULAR: There is a regular rate and rhythm. PULMONARY: Non-labored respirations Incisions are healing nicely  STUDIES:   LEFT:  - Findings are consistent with successful ablation of the left great  saphenous vein.     Post Ablation Summary:  - No evidence of deep vein thrombosis from the left common femoral through  the popliteal veins.  - Successful ablation of the left Small Saphenous Vein from the Distal  thigh to within 0.6 cm from the sapheno- femoral junction.        MEDICAL ISSUES:   Status post left endovenous laser ablation of the left saphenous vein.  Her swelling has dramatically improved.  She is very happy with her results.  Right leg painful varicosities: The patient's  venous ultrasound shows significant reflux in the right saphenous vein.  In addition, she has bumped her thigh and had significant bruising from her varicosity.  The varix is painful and she does have swelling of the leg.  She has already 1 20-30 thigh-high compression stockings for 3 months.  She did get good relief.  She does keep her legs elevated when possible.  We discussed proceeding with endovenous laser ablation of the right saphenous vein and greater than 20 stabs to treat her painful varicosity.  She is traveling in August and would like to do this when she returns.  We will work on Therapist, occupational in the interim.    Charlena Cross, MD, FACS Vascular and Vein  [facility] [phone] Pager [phone]

## 2021-12-19 ENCOUNTER — Ambulatory Visit: Payer: No Typology Code available for payment source | Admitting: Surgical

## 2021-12-19 ENCOUNTER — Encounter: Payer: Self-pay | Admitting: Orthopedic Surgery

## 2021-12-19 DIAGNOSIS — M5412 Radiculopathy, cervical region: Secondary | ICD-10-CM

## 2021-12-19 NOTE — Progress Notes (Signed)
Office Visit Note   Patient: Sydney [name]           Date of Birth: August 29, 1960           MRN: [id number] Visit Date: 12/19/2021 Requested by: Janora Norlander, DO [place],  [place] [address] PCP: Janora Norlander, DO  Subjective: Chief Complaint  Patient presents with   Other     Scan review    HPI: Sydney [name] is a 61 y.o. female who presents to the office for MRI review. Patient denies any changes in symptoms.  Continues to complain mainly of continued lateral right shoulder pain.  She had A/C joint and glenohumeral joint injections at her last appointment and she states that these have provided about 60% relief of her symptoms.  She is here today to review MRI cervical spine.  She has history of prior cervical spine surgery in [place] by Dr. Lynford Humphrey at [facility].  MRI results revealed: MR Cervical Spine w/o contrast  Result Date: 12/12/2021 CLINICAL DATA:  61 year old female with neck and right arm radicular pain. EXAM: MRI CERVICAL SPINE WITHOUT CONTRAST TECHNIQUE: Multiplanar, multisequence MR imaging of the cervical spine was performed. No intravenous contrast was administered. COMPARISON:  Prior chest imaging but no prior cervical or neck imaging available. FINDINGS: Alignment: Normal to mildly exaggerated cervical lordosis. No significant spondylolisthesis. Vertebrae: Mild hardware susceptibility artifact at C6-C7 and C6-C7 appears related to prior ACDF. No marrow edema or evidence of acute osseous abnormality. Background bone marrow signal is within normal limits. Cord: Normal. Fairly capacious spinal canal at most levels. Posterior Fossa, vertebral arteries, paraspinal tissues: Cervicomedullary junction is within normal limits. Negative visible posterior fossa. Preserved major vascular flow voids in the neck. Fairly codominant vertebral arteries. Negative visible neck soft tissues and lung apices. Disc levels: C2-C3:  Negative. C3-C4: Mild disc bulging and  endplate spurring. Moderate to severe left and mild right facet hypertrophy. No spinal stenosis. Up to moderate left, mild right C4 foraminal stenosis. C4-C5: Mild disc bulging and endplate spurring. Moderate to severe right facet hypertrophy. No spinal stenosis. But moderate to severe right C5 foraminal stenosis. C5-C6: Prior ACDF. No spinal stenosis. Residual endplate spurring. Moderate bilateral C6 foraminal stenosis. C6-C7: Prior ACDF. Mild endplate and facet hypertrophy. No spinal stenosis. Mild to moderate left and mild right residual C7 foraminal stenosis. C7-T1:  Minor disc bulging and endplate spurring. No stenosis. No visible upper thoracic spinal stenosis. IMPRESSION: 1. Prior ACDF at C5-C6 and C6-C7 with no cervical spinal stenosis. Up to moderate residual neural foraminal stenosis at the Bilateral C6 and left C7 nerve levels. 2. 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 And similar left side facet arthropathy at C3-C4. Subsequent moderate left C4 and moderate to severe Right C5 neural foraminal stenosis. Electronically Signed   By: Genevie Ann M.D.   On: 12/12/2021 13:26                 ROS: All systems reviewed are negative as they relate to the chief complaint within the history of present illness.  Patient denies fevers or chills.  Assessment & Plan: Visit Diagnoses:  1. Radiculopathy, cervical region     Plan: Sydney [name] is a 61 y.o. female who presents to the office for review of MRI cervical spine.  MRI demonstrated adjacent segment disease at C4-C5 with moderate to severe facet arthropathy on the right as well as moderate to severe right-sided C5 neuroforaminal stenosis.  This is likely contributing to her right shoulder pain and paresthesias in this region.  Plan for referral to Dr. Laurence Spates for cervical spine ESI.  Follow-up after ESI for reevaluation and discussion about which intervention was more helpful between the shoulder  injection and the neck injection.  Follow-Up Instructions: No follow-ups on file.   Orders:  Orders Placed This Encounter  Procedures   Ambulatory referral to Physical Medicine Rehab   No orders of the defined types were placed in this encounter.     Procedures: No procedures performed   Clinical Data: No additional findings.  Objective: Vital Signs: There were no vitals taken for this visit.  Physical Exam:  Constitutional: Patient appears well-developed HEENT:  Head: Normocephalic Eyes:EOM are normal Neck: Normal range of motion Cardiovascular: Normal rate Pulmonary/chest: Effort normal Neurologic: Patient is alert Skin: Skin is warm Psychiatric: Patient has normal mood and affect  Ortho Exam: Ortho exam demonstrates no change since prior exam.  Specialty Comments:  No specialty comments available.  Imaging: No results found.   PMFS History: Patient Active Problem List   Diagnosis Date Noted   Peripheral vascular disease with stasis dermatitis 09/03/2021   Urine frequency 05/10/2021   Cellulitis of left leg 01/18/2021   Rash 01/18/2021   History of bilateral hip arthroplasty 01/03/2021   Upper respiratory tract infection 12/30/2020   Non-recurrent acute serous otitis media of right ear 12/30/2020   Pharyngitis 10/29/2020   Closed fracture of multiple pubic rami, left, sequela 08/16/2020   H/O adenomatous polyp of colon 05/23/2020   FH: colon cancer in relative diagnosed at >58 years old 05/23/2020   Spinal stenosis of lumbar region 05/03/2020   Impingement syndrome of right shoulder 05/03/2020   H/O total hip arthroplasty 12/06/2019   Status post total replacement of left hip 09/15/2019   Back pain 07/01/2018   DDD (degenerative disc disease), lumbosacral 07/01/2018   Venous insufficiency of both lower extremities 11/17/2016   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 level with radiculopathy 08/18/2016   Morbid obesity with BMI of 40.0-44.9, adult ([place])  07/28/2016   Chronic pain syndrome 10/24/2015   Dysfunction of both eustachian tubes 07/26/2014   Calculus of left kidney 04/25/2014   Impaired fasting glucose 04/05/2014   History of bladder surgery 04/03/2014   History of hysterectomy 04/03/2014   Urge incontinence 04/03/2014   Asthma 07/28/2013   Nontoxic uninodular goiter 07/28/2013   Solitary pulmonary nodule 07/28/2013   Lumbar radiculopathy 04/20/2013   Insomnia 03/22/2013   Essential hypertension 08/18/2011   Tobacco use disorder 08/18/2011   Moderate episode of recurrent major depressive disorder ([place]) 04/03/2011   Other B-complex deficiencies 08/16/2010   Family history of cerebrovascular accident (CVA) 04/17/2010   Family history of malignant neoplasm of gastrointestinal tract 04/17/2010   Family history of other cardiovascular diseases(V17.49) 04/17/2010   Past Medical History:  Diagnosis Date   Allergy    Anemia    history of   Anxiety    Arthritis    Asthma    Atherosclerosis    Depression    Family history of adverse reaction to anesthesia    pt's mother felt everything and couldn't speak during a gallbladder surgery   GERD (gastroesophageal reflux disease)    History of hiatal hernia    History of kidney stones    Hypertension    Pneumonia    a few times   TIA (transient ischemic attack) 2012    Family History  Problem Relation Age of Onset  Anxiety disorder Mother    Depression Mother    Heart disease Mother    Hypertension Mother    Arrhythmia Mother    Cancer Father        colon, age greater than 22   Lung cancer Father    Migraines Sister    Cancer Daughter        nerve sheath sarcoma   Alcohol abuse Brother    Breast cancer Neg Hx     Past Surgical History:  Procedure Laterality Date   ABDOMINAL HYSTERECTOMY     abdominal   APPENDECTOMY     bladder tack     CARPAL TUNNEL RELEASE Bilateral    CERVICAL SPINE SURGERY     5-6, 6-7   CHOLECYSTECTOMY     COLONOSCOPY WITH PROPOFOL N/A  07/10/2020   Procedure: COLONOSCOPY WITH PROPOFOL;  Surgeon: Eloise Harman, DO;  [facility];  Service: Endoscopy;  Laterality: N/A;  12:15pm   ENDOVENOUS ABLATION SAPHENOUS VEIN W/ LASER Left 11/28/2021   endovenous laser ablation left greater saphenous vein by Harold Barban MD   HAMMER TOE SURGERY Right    JOINT REPLACEMENT     TOTAL HIP 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   Procedure: LEFT TOTAL HIP ARTHROPLASTY -DIRECT ANTERIOR;  Surgeon: Marybelle Killings, MD;  Location: [facility];  Service: Orthopedics;  Laterality: Left;   TOTAL HIP ARTHROPLASTY Right 12/05/2019   Procedure: RIGHT TOTAL HIP ARTHROPLASTY ANTERIOR APPROACH  DIRECT ANTERIOR;  Surgeon: Marybelle Killings, MD;  Location: [facility];  Service: Orthopedics;  Laterality: Right;   Social History   Occupational History   Not on file  Tobacco Use   Smoking status: Every Day    Packs/day: 0.50    Years: 23.00    Pack years: 11.50    Types: Cigarettes   Smokeless tobacco: Never  Vaping Use   Vaping Use: Never used  Substance and Sexual Activity   Alcohol use: Yes    Comment: occ   Drug use: Never   Sexual activity: Not Currently

## 2021-12-24 ENCOUNTER — Telehealth: Payer: Self-pay | Admitting: Physical Medicine and Rehabilitation

## 2021-12-24 NOTE — Telephone Encounter (Signed)
Patient returned call asked for a call back to schedule an appointment.  [phone]

## 2021-12-30 ENCOUNTER — Other Ambulatory Visit: Payer: Self-pay | Admitting: *Deleted

## 2021-12-30 ENCOUNTER — Telehealth: Payer: Self-pay | Admitting: Family Medicine

## 2021-12-30 DIAGNOSIS — I83811 Varicose veins of right lower extremities with pain: Secondary | ICD-10-CM

## 2021-12-30 NOTE — Telephone Encounter (Signed)
Pt scheduled  

## 2021-12-30 NOTE — Telephone Encounter (Signed)
Pt called stating that she is having a lot of sciatic nerve pain and would really like to see Dr Nadine Counts about it. Says she has hydrocodone but is really trying not to take it.  Wants to know if Dr Nadine Counts can work her in to be seen tomorrow.  Please advise and call patient.

## 2021-12-31 ENCOUNTER — Ambulatory Visit (INDEPENDENT_AMBULATORY_CARE_PROVIDER_SITE_OTHER): Payer: No Typology Code available for payment source | Admitting: Family Medicine

## 2021-12-31 ENCOUNTER — Encounter: Payer: Self-pay | Admitting: Family Medicine

## 2021-12-31 ENCOUNTER — Ambulatory Visit (INDEPENDENT_AMBULATORY_CARE_PROVIDER_SITE_OTHER): Payer: No Typology Code available for payment source

## 2021-12-31 VITALS — BP 106/62 | HR 69 | Temp 98.2°F | Ht 65.5 in | Wt 234.0 lb

## 2021-12-31 DIAGNOSIS — G8929 Other chronic pain: Secondary | ICD-10-CM

## 2021-12-31 DIAGNOSIS — M5137 Other intervertebral disc degeneration, lumbosacral region: Secondary | ICD-10-CM

## 2021-12-31 DIAGNOSIS — Z79899 Other long term (current) drug therapy: Secondary | ICD-10-CM

## 2021-12-31 DIAGNOSIS — M25552 Pain in left hip: Secondary | ICD-10-CM

## 2021-12-31 DIAGNOSIS — M5442 Lumbago with sciatica, left side: Secondary | ICD-10-CM | POA: Diagnosis not present

## 2021-12-31 MED ORDER — CYCLOBENZAPRINE HCL 10 MG PO TABS: 5.0000 mg | ORAL_TABLET | Freq: Three times a day (TID) | ORAL | 0 refills | Status: DC | PRN

## 2021-12-31 MED ORDER — HYDROCODONE-ACETAMINOPHEN 5-325 MG PO TABS: 1.0000 | ORAL_TABLET | ORAL | 0 refills | Status: DC | PRN

## 2021-12-31 NOTE — Patient Instructions (Signed)
Follow up with Dr Lorin Mercy if pain continues Renewal of Norco sent if needed Flexeril sent Let me know if you want PT.

## 2021-12-31 NOTE — Progress Notes (Signed)
Subjective: CC: Sciatica PCP: Raliegh Ip, DO OFB:PZWC Sydney [name] is a 61 y.o. female presenting to clinic today for:  1.  Sciatica Patient reports flareup of her left-sided sciatica which in fact is radiating to the left groin as well over the weekend.  She notes that Friday was pretty bad and she ended up having to take 2 of her Norco, Saturday and Sunday and Monday only 1 but pain is ongoing and exacerbated by certain positions.  She has been utilizing heat.  She has history of bilateral hip arthroplasty and DDD of the low back.  She also has trochanteric bursitis of the left hip that has been treated previously by Dr. Ophelia Charter.  She has not yet reached out to her orthopedic doctor about these issues.   ROS: Per HPI  Allergies  Allergen Reactions   Bee Pollen Anaphylaxis and Swelling   Bee Venom Anaphylaxis and Swelling   Butorphanol Other (See Comments)    Not sure told by md she was allergic after surgery.    Meloxicam Anxiety    Mood disorder Altered her personality    Penicillins Anaphylaxis and Hives    Unable to Recall As child mom was told she is highly allergic Did it involve swelling of the face/tongue/throat, SOB, or low BP? Unknown Did it involve sudden or severe rash/hives, skin peeling, or any reaction on the inside of your mouth or nose? Unknown Did you need to seek medical attention at a hospital or doctor's office? Unknown When did it last happen?      childhood allergy If all above answers are "NO", may proceed with cephalosporin use.    Prochlorperazine Edisylate Anaphylaxis   Sulfa Antibiotics Anaphylaxis    Unable to Recall   Tramadol Anxiety    Didn't like the way it made her feel    Clindamycin Hives   Levaquin [Levofloxacin] Itching   Topiramate Nausea And Vomiting   Doxycycline Dermatitis, Hives, Itching, Photosensitivity and Rash   Past Medical History:  Diagnosis Date   Allergy    Anemia    history of   Anxiety    Arthritis     Asthma    Atherosclerosis    Depression    Family history of adverse reaction to anesthesia    pt's mother felt everything and couldn't speak during a gallbladder surgery   GERD (gastroesophageal reflux disease)    History of hiatal hernia    History of kidney stones    Hypertension    Pneumonia    a few times   TIA (transient ischemic attack) 2012    Current Outpatient Medications:    albuterol (VENTOLIN HFA) 108 (90 Base) MCG/ACT inhaler, Inhale 2 puffs into the lungs every 6 (six) hours as needed for wheezing or shortness of breath., Disp: , Rfl:    amLODipine (NORVASC) 5 MG tablet, TAKE 1 TABLET DAILY, Disp: 90 tablet, Rfl: 0   augmented betamethasone dipropionate (DIPROLENE-AF) 0.05 % cream, Apply topically., Disp: , Rfl:    cetirizine (ZYRTEC) 10 MG tablet, Take 10 mg by mouth daily., Disp: , Rfl:    furosemide (LASIX) 20 MG tablet, Take 1 tablet (20 mg total) by mouth daily as needed for edema., Disp: 90 tablet, Rfl: 3   HYDROcodone-acetaminophen (NORCO/VICODIN) 5-325 MG tablet, Take 1 tablet by mouth every 4 (four) hours as needed for severe pain., Disp: 30 tablet, Rfl: 0   hydrOXYzine (VISTARIL) 25 MG capsule, Take 1-2 capsules (25-50 mg total) by mouth every 8 (eight) hours  as needed for anxiety., Disp: 60 capsule, Rfl: 1   metoprolol succinate (TOPROL-XL) 25 MG 24 hr tablet, TAKE 1 TABLET DAILY, Disp: 90 tablet, Rfl: 0   rosuvastatin (CRESTOR) 10 MG tablet, Take 10 mg by mouth every other day., Disp: , Rfl:    sertraline (ZOLOFT) 100 MG tablet, Take 1.5 tablets (150 mg total) by mouth daily., Disp: 90 tablet, Rfl: 3   traZODone (DESYREL) 100 MG tablet, TAKE 1 TO 2 TABLETS AT     BEDTIME AS NEEDED FOR SLEEP, Disp: 180 tablet, Rfl: 1 Social History   Socioeconomic History   Marital status: Married    Spouse name: Not on file   Number of children: 3   Years of education: Not on file   Highest education level: Not on file  Occupational History   Not on file  Tobacco Use    Smoking status: Every Day    Packs/day: 0.50    Years: 23.00    Total pack years: 11.50    Types: Cigarettes   Smokeless tobacco: Never  Vaping Use   Vaping Use: Never used  Substance and Sexual Activity   Alcohol use: Yes    Comment: occ   Drug use: Never   Sexual activity: Not Currently  Other Topics Concern   Not on file  Social History Narrative   Recently relocated to [place] from [place].  She resides at home with her husband.   She has 3 children but 1 passed away from cancer.   Social Determinants of Health   Financial Resource Strain: Not on file  Food Insecurity: Not on file  Transportation Needs: Not on file  Physical Activity: Not on file  Stress: Not on file  Social Connections: Not on file  Intimate Partner Violence: Not on file   Family History  Problem Relation Age of Onset   Anxiety disorder Mother    Depression Mother    Heart disease Mother    Hypertension Mother    Arrhythmia Mother    Cancer Father        colon, age greater than 21   Lung cancer Father    Migraines Sister    Cancer Daughter        nerve sheath sarcoma   Alcohol abuse Brother    Breast cancer Neg Hx     Objective: Office vital signs reviewed. BP 106/62   Pulse 69   Temp 98.2 F (36.8 C)   Ht 5' 5.5" (1.664 m)   Wt 234 lb (106.1 kg)   SpO2 95%   BMI 38.35 kg/m   Physical Examination:  General: Awake, alert, well nourished, No acute distress but uncomfortable appearing MSK: Gait is very antalgic.  Utilizing cane for ambulation.  Hypersensitivity of left lower extremity along the thigh and trochanteric area  Assessment/ Plan: 61 y.o. female   DDD (degenerative disc disease), lumbosacral - Plan: ToxASSURE Select 13 (MW), Urine, DG Hip Unilat W OR W/O Pelvis 2-3 Views Left, HYDROcodone-acetaminophen (NORCO/VICODIN) 5-325 MG tablet, cyclobenzaprine (FLEXERIL) 10 MG tablet  Chronic left-sided low back pain with left-sided sciatica - Plan: ToxASSURE Select 13 (MW),  Urine, DG Hip Unilat W OR W/O Pelvis 2-3 Views Left, HYDROcodone-acetaminophen (NORCO/VICODIN) 5-325 MG tablet, cyclobenzaprine (FLEXERIL) 10 MG tablet  Controlled substance agreement signed - Plan: ToxASSURE Select 13 (MW), Urine  Left hip pain - Plan: DG Hip Unilat W OR W/O Pelvis 2-3 Views Left, HYDROcodone-acetaminophen (NORCO/VICODIN) 5-325 MG tablet, cyclobenzaprine (FLEXERIL) 10 MG tablet  Flareup of low  back pain with left-sided sciatica.  She also is having some radiation to the left groin which makes me wonder if she has hip pathology as well.  Given her history of bilateral hip arthroplasty, plain films were collected today.  We will see if we can get some of the lumbar spine into that view as well.  I considered treatment with corticosteroid injection but given her upcoming injection for C-spine pathology I was hesitant to use so therefore, I am going to treat her with Norco and Flexeril.  National narcotic database reviewed and there were no red flags.  I suspect that she may have some benzo in her urine as we did treat her for claustrophobia not too long ago for MRI.  Would like her to follow-up with orthopedics if symptoms are ongoing.  We will CC the chart today to her orthopedic doctor along with results of the plain films.  No orders of the defined types were placed in this encounter.  No orders of the defined types were placed in this encounter.    Raliegh IpAshly M Azara Gemme, DO [facility] [phone]

## 2022-01-04 LAB — TOXASSURE SELECT 13 (MW), URINE

## 2022-01-14 ENCOUNTER — Other Ambulatory Visit: Payer: Self-pay | Admitting: *Deleted

## 2022-01-14 ENCOUNTER — Ambulatory Visit: Payer: Self-pay

## 2022-01-14 ENCOUNTER — Encounter: Payer: Self-pay | Admitting: Physical Medicine and Rehabilitation

## 2022-01-14 ENCOUNTER — Ambulatory Visit: Payer: No Typology Code available for payment source | Admitting: Physical Medicine and Rehabilitation

## 2022-01-14 VITALS — BP 100/67 | HR 103

## 2022-01-14 DIAGNOSIS — M5412 Radiculopathy, cervical region: Secondary | ICD-10-CM | POA: Diagnosis not present

## 2022-01-14 MED ORDER — METHYLPREDNISOLONE ACETATE 80 MG/ML IJ SUSP
80.0000 mg | Freq: Once | INTRAMUSCULAR | Status: AC
  Administered 2022-01-14: 80 mg

## 2022-01-14 MED ORDER — LORAZEPAM 1 MG PO TABS: ORAL_TABLET | ORAL | 0 refills | Status: DC

## 2022-01-14 NOTE — Progress Notes (Signed)
Pt state neck pain that travels to both shoulder. Pt state lifting and moving things makes the pain worse. Pt state she takes over the counter pain meds and heat to help ease her pain.  Numeric Pain Rating Scale and Functional Assessment Average Pain 4   In the last MONTH (on 0-10 scale) has pain interfered with the following?  1. General activity like being  able to carry out your everyday physical activities such as walking, climbing stairs, carrying groceries, or moving a chair?  Rating(7)   +Driver, -BT, -Dye Allergies.

## 2022-01-16 ENCOUNTER — Ambulatory Visit (INDEPENDENT_AMBULATORY_CARE_PROVIDER_SITE_OTHER): Payer: No Typology Code available for payment source | Admitting: Orthopaedic Surgery

## 2022-01-16 ENCOUNTER — Encounter: Payer: Self-pay | Admitting: Orthopaedic Surgery

## 2022-01-16 VITALS — Ht 65.5 in | Wt 234.0 lb

## 2022-01-16 DIAGNOSIS — M5137 Other intervertebral disc degeneration, lumbosacral region: Secondary | ICD-10-CM

## 2022-01-16 MED ORDER — PREDNISONE 10 MG (21) PO TBPK: ORAL_TABLET | ORAL | 0 refills | Status: DC

## 2022-01-16 NOTE — Progress Notes (Signed)
Office Visit Note   Patient: Sydney [name]           Date of Birth: 1961-06-14           MRN: [id number] Visit Date: 01/16/2022              Requested by: Raliegh Ip, DO [address] [place],  [place] [address] PCP: Raliegh Ip, DO   Assessment & Plan: Visit Diagnoses:  1. DDD (degenerative disc disease), lumbosacral     Plan: Prednisone Dosepak sent in.  She has persistent problems with this we can consider further evaluation.  Follow-Up Instructions: Return in about 3 weeks (around 02/06/2022).   Orders:  No orders of the defined types were placed in this encounter.  Meds ordered this encounter  Medications   predniSONE (STERAPRED UNI-PAK 21 TAB) 10 MG (21) TBPK tablet    Sig: Take 6,5,4,3,2,1 one tablet less each day    Dispense:  21 tablet    Refill:  0      Procedures: No procedures performed   Clinical Data: No additional findings.   Subjective: Chief Complaint  Patient presents with   Lower Back - Pain    LBP radiating around to hip, worse after she heard a snap with immediate crippling pain 01/11/22    HPI 61 year old female seen with increased pain since 6/24 when she heard a snap in her back radiating pain down to her knee.  She had increased pain going from sitting to standing.  Patient had some chronic left lower extremity edema has some venous stasis skin discoloration and darkening without active ulceration currently.  No current bowel or bladder symptoms.   Hydrocodone and muscle relaxant.  She states she feels like she has sciatica.  Previous history of cervical fusion to level.  She has some adjacent level C4-5 changes. Review of Systems all other systems noncontributory to HPI   Objective: Vital Signs: Ht 5' 5.5" (1.664 m)   Wt 234 lb (106.1 kg)   BMI 38.35 kg/m   Physical Exam Constitutional:      Appearance: She is well-developed.  HENT:     Head: Normocephalic.     Right Ear: External ear normal.     Left Ear:  External ear normal. There is no impacted cerumen.  Eyes:     Pupils: Pupils are equal, round, and reactive to light.  Neck:     Thyroid: No thyromegaly.     Trachea: No tracheal deviation.  Cardiovascular:     Rate and Rhythm: Normal rate.  Pulmonary:     Effort: Pulmonary effort is normal.  Abdominal:     Palpations: Abdomen is soft.  Musculoskeletal:     Cervical back: No rigidity.  Skin:    General: Skin is warm and dry.  Neurological:     Mental Status: She is alert and oriented to person, place, and time.  Psychiatric:        Behavior: Behavior normal.     Ortho Exam patient positive straight leg raising positive popliteal compression tests mild trochanteric bursal tenderness with palpation.  Knee and ankle jerk are intact.  Reflexes are intact anterior tib is intact.  Specialty Comments:  MRI CERVICAL SPINE WITHOUT CONTRAST   TECHNIQUE: Multiplanar, multisequence MR imaging of the cervical spine was performed. No intravenous contrast was administered.   COMPARISON:  Prior chest imaging but no prior cervical or neck imaging available.   FINDINGS: Alignment: Normal to mildly exaggerated cervical lordosis. No significant  spondylolisthesis.   Vertebrae: Mild hardware susceptibility artifact at C6-C7 and C6-C7 appears related to prior ACDF. No marrow edema or evidence of acute osseous abnormality. Background bone marrow signal is within normal limits.   Cord: Normal. Fairly capacious spinal canal at most levels.   Posterior Fossa, vertebral arteries, paraspinal tissues: Cervicomedullary junction is within normal limits. Negative visible posterior fossa. Preserved major vascular flow voids in the neck. Fairly codominant vertebral arteries. Negative visible neck soft tissues and lung apices.   Disc levels:   C2-C3:  Negative.   C3-C4: Mild disc bulging and endplate spurring. Moderate to severe left and mild right facet hypertrophy. No spinal stenosis. Up  to moderate left, mild right C4 foraminal stenosis.   C4-C5: Mild disc bulging and endplate spurring. Moderate to severe right facet hypertrophy. No spinal stenosis. But moderate to severe right C5 foraminal stenosis.   C5-C6: Prior ACDF. No spinal stenosis. Residual endplate spurring. Moderate bilateral C6 foraminal stenosis.   C6-C7: Prior ACDF. Mild endplate and facet hypertrophy. No spinal stenosis. Mild to moderate left and mild right residual C7 foraminal stenosis.   C7-T1:  Minor disc bulging and endplate spurring. No stenosis.   No visible upper thoracic spinal stenosis.   IMPRESSION: 1. Prior ACDF at C5-C6 and C6-C7 with no cervical spinal stenosis. Up to moderate residual neural foraminal stenosis at the Bilateral C6 and left C7 nerve levels.   2. 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 And similar left side facet arthropathy at C3-C4. Subsequent moderate left C4 and moderate to severe Right C5 neural foraminal stenosis.     Electronically Signed   By: Odessa Fleming M.D.   On: 12/12/2021 13:26  Imaging: No results found.   PMFS History: Patient Active Problem List   Diagnosis Date Noted   Peripheral vascular disease with stasis dermatitis 09/03/2021   Urine frequency 05/10/2021   Cellulitis of left leg 01/18/2021   Rash 01/18/2021   History of bilateral hip arthroplasty 01/03/2021   Upper respiratory tract infection 12/30/2020   Non-recurrent acute serous otitis media of right ear 12/30/2020   Pharyngitis 10/29/2020   Closed fracture of multiple pubic rami, left, sequela 08/16/2020   H/O adenomatous polyp of colon 05/23/2020   FH: colon cancer in relative diagnosed at >75 years old 05/23/2020   Spinal stenosis of lumbar region 05/03/2020   Impingement syndrome of right shoulder 05/03/2020   H/O total hip arthroplasty 12/06/2019   Status post total replacement of left hip 09/15/2019   Back pain 07/01/2018    DDD (degenerative disc disease), lumbosacral 07/01/2018   Venous insufficiency of both lower extremities 11/17/2016   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 level with radiculopathy 08/18/2016   Morbid obesity with BMI of 40.0-44.9, adult (HCC) 07/28/2016   Chronic pain syndrome 10/24/2015   Dysfunction of both eustachian tubes 07/26/2014   Calculus of left kidney 04/25/2014   Impaired fasting glucose 04/05/2014   History of bladder surgery 04/03/2014   History of hysterectomy 04/03/2014   Urge incontinence 04/03/2014   Asthma 07/28/2013   Nontoxic uninodular goiter 07/28/2013   Solitary pulmonary nodule 07/28/2013   Lumbar radiculopathy 04/20/2013   Insomnia 03/22/2013   Essential hypertension 08/18/2011   Tobacco use disorder 08/18/2011   Moderate episode of recurrent major depressive disorder (HCC) 04/03/2011   Other B-complex deficiencies 08/16/2010   Family history of cerebrovascular accident (CVA) 04/17/2010   Family history of malignant neoplasm of gastrointestinal tract 04/17/2010   Family  history of other cardiovascular diseases(V17.49) 04/17/2010   Past Medical History:  Diagnosis Date   Allergy    Anemia    history of   Anxiety    Arthritis    Asthma    Atherosclerosis    Depression    Family history of adverse reaction to anesthesia    pt's mother felt everything and couldn't speak during a gallbladder surgery   GERD (gastroesophageal reflux disease)    History of hiatal hernia    History of kidney stones    Hypertension    Pneumonia    a few times   TIA (transient ischemic attack) 2012    Family History  Problem Relation Age of Onset   Anxiety disorder Mother    Depression Mother    Heart disease Mother    Hypertension Mother    Arrhythmia Mother    Cancer Father        colon, age greater than 65   Lung cancer Father    Migraines Sister    Cancer Daughter        nerve sheath sarcoma   Alcohol abuse Brother    Breast cancer Neg Hx     Past Surgical  History:  Procedure Laterality Date   ABDOMINAL HYSTERECTOMY     abdominal   APPENDECTOMY     bladder tack     CARPAL TUNNEL RELEASE Bilateral    CERVICAL SPINE SURGERY     5-6, 6-7   CHOLECYSTECTOMY     COLONOSCOPY WITH PROPOFOL N/A 07/10/2020   Procedure: COLONOSCOPY WITH PROPOFOL;  Surgeon: Lanelle Bal, DO;  [facility]: [facility];  Service: Endoscopy;  Laterality: N/A;  12:15pm   ENDOVENOUS ABLATION SAPHENOUS VEIN W/ LASER Left 11/28/2021   endovenous laser ablation left greater saphenous vein by Coral Else MD   HAMMER TOE SURGERY Right    JOINT REPLACEMENT     TOTAL HIP 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   Procedure: LEFT TOTAL HIP ARTHROPLASTY -DIRECT ANTERIOR;  Surgeon: Eldred Manges, MD;  Location: [facility];  Service: Orthopedics;  Laterality: Left;   TOTAL HIP ARTHROPLASTY Right 12/05/2019   Procedure: RIGHT TOTAL HIP ARTHROPLASTY ANTERIOR APPROACH  DIRECT ANTERIOR;  Surgeon: Eldred Manges, MD;  Location: [facility];  Service: Orthopedics;  Laterality: Right;   Social History   Occupational History   Not on file  Tobacco Use   Smoking status: Every Day    Packs/day: 0.50    Years: 23.00    Total pack years: 11.50    Types: Cigarettes   Smokeless tobacco: Never  Vaping Use   Vaping Use: Never used  Substance and Sexual Activity   Alcohol use: Yes    Comment: occ   Drug use: Never   Sexual activity: Not Currently

## 2022-01-23 ENCOUNTER — Encounter: Payer: Self-pay | Admitting: Surgery

## 2022-01-23 ENCOUNTER — Ambulatory Visit: Payer: No Typology Code available for payment source | Admitting: Surgery

## 2022-01-23 VITALS — BP 124/81 | HR 74 | Temp 98.2°F | Resp 16 | Ht 65.0 in | Wt 233.0 lb

## 2022-01-23 DIAGNOSIS — I83811 Varicose veins of right lower extremities with pain: Secondary | ICD-10-CM

## 2022-01-23 NOTE — Progress Notes (Signed)
Laser Ablation Procedure Date: July 6th, 2023  Consent signed: YES  Surgeon: Dr. Coral Else   Procedure: Laser Ablation of Right Greater Saphenous Vein   B/P: 124/84 B/P location: right arm ; pt sitting; normal cuff size), Resp 16. Pulse 74, Temp. 98.2 degrees Farenheit (temporal), Weight  233 lbs,Ht 5'5", 95% SpO2    Tumescent Anesthesia: 550 mL 0.9% NaCl with 50 mL Lidocaine HCL 1% and 15 mL 8.4% NaHCO3.    Local Anesthesia: mL Lidocaine HCL 1% and NaHCO3 (ratio 2:1)   7 Watts continuous mode  Total energy: 1756.3 Joules   Total time: 250 seconds   Treatment length:  41 cm   Laser Fiber Reference #: [id number] Lot #: [id number]   Stab Phlebectomies: >20 R Leg   Patient tolerated procedure well.   Notes: All staff members wore facial masks.   Description of Procedure:   After marking the course of the secondary varicosities, the pt was placed on the operating table in the supine position. The RLE was prepped with Betadine and draped in sterile fashion. Local anesthetic was administered and the saphenous vein was accessed under ultrasound guidance, with a micro needle and guidewire. Then the micro puncture sheath was placed. A guidewire was inserted at saphenofemoral junction, followed by a 5 french sheath.  The position of the sheath and then the laser fiber below the junction was confirmed using the ultrasound.  Tumescent anesthesia was administered along the course of the saphenous vein using ultrasound guidance. The patient was placed in Trendelenburg position and protective laser glasses were placed on pt and staff.  The laser was fired at 7 watts of continuous mode for a total of 1756.3 Joules. A total of 1mL of local was administered.    For stab phlebectomies, local anesthetic was administered at the previously marked varicosities, and tumescent anesthesia was administered around the vessels. Greater than 20 stab wounds were made using the tip of an 11 blade. Using a vein  hook, the phlebectomies were performed with a hemostat to avulse the varicosities. Adequate hemostasis was achieved.   Steristrips were applied to the sab wounds. ABD pads were also applied covering the entire length of RLE that was treated with laser and stabs. Thigh high compression stocking was applied over that and ACE wrap bandages were applied over the phlebectomy sites and at the top of the saphenofemoral junction. Blood loss was approximately 50 mL. Discharge instructions were reviewed with patient and a hardcopy was given to the pt to take home. The pt ambulated out of the room, having tolerated procedure well.   Addendum:  u/s guided access to the right GSV.  EVLT of the GSV for 41 cm.  >20 stabs performed.  No complications  Wells Jearldean Gutt

## 2022-01-27 ENCOUNTER — Encounter: Payer: Self-pay | Admitting: Orthopaedic Surgery

## 2022-01-30 ENCOUNTER — Ambulatory Visit (INDEPENDENT_AMBULATORY_CARE_PROVIDER_SITE_OTHER): Payer: No Typology Code available for payment source | Admitting: Orthopaedic Surgery

## 2022-01-30 DIAGNOSIS — M545 Low back pain, unspecified: Secondary | ICD-10-CM | POA: Diagnosis not present

## 2022-01-30 NOTE — Progress Notes (Signed)
Severe  Office Visit Note   Patient: Sydney [name]           Date of Birth: 1960-10-30           MRN: [id number] Visit Date: 01/30/2022              Requested by: Raliegh Ip, DO [address] [place],  [place] [address] PCP: Raliegh Ip, DO   Assessment & Plan: Visit Diagnoses:  1. Acute bilateral low back pain, unspecified whether sciatica present     Plan: Patient with a new acute radicular symptoms quad weakness giving way left knee buckling.  Previous MRI a few years ago 2021 showed spinal stenosis lateral recess stenosis at L3-4 and also some lateral recess narrowing on the right at L4-5 opposite side where she is symptomatic.  We will obtain a new MRI scan for her acute nerve compression and radicular symptoms and likely has some compression at either left L2-3 or left L3-4.  Follow-up after MRI scan.  We discussed patient must use a walking aid to prevent falling with her quad giving way.  Follow-Up Instructions: No follow-ups on file.   Orders:  Orders Placed This Encounter  Procedures   MR Lumbar Spine w/o contrast   No orders of the defined types were placed in this encounter.     Procedures: No procedures performed   Clinical Data: No additional findings.   Subjective: Chief Complaint  Patient presents with   Lower Back - Pain, Follow-up    HPI 61 year old female post bilateral total knees onset back pain when she was reaching on 01/11/2022.  I saw her on 01/16/2022 placed on a Medrol Dosepak she states she is not really any better she cannot sit she has a quad weakness on the left states her knee is buckling on her.  No instability in her hip joints.  She has numbness and tingling over the anterior lateral thigh and states she is only comfortable when she is laying down.  Pain radiates to her back and her buttocks and her left thigh stops at the knee.  Patient's had to use her cane or walker to prevent falling.  Patient had previous cervical fusion no  neck problems.  Review of Systems updated unchanged   Objective: Vital Signs: There were no vitals taken for this visit.  Physical Exam Constitutional:      Appearance: She is well-developed.  HENT:     Head: Normocephalic.     Right Ear: External ear normal.     Left Ear: External ear normal. There is no impacted cerumen.  Eyes:     Pupils: Pupils are equal, round, and reactive to light.  Neck:     Thyroid: No thyromegaly.     Trachea: No tracheal deviation.  Cardiovascular:     Rate and Rhythm: Normal rate.  Pulmonary:     Effort: Pulmonary effort is normal.  Abdominal:     Palpations: Abdomen is soft.  Musculoskeletal:     Cervical back: No rigidity.  Skin:    General: Skin is warm and dry.  Neurological:     Mental Status: She is alert and oriented to person, place, and time.  Psychiatric:        Behavior: Behavior normal.     Ortho Exam hip incisions healed no pain with hip range of motion she has some quad weakness I can overcome it with 1 finger pressure at the ankle on the left normal strength on the  right decreased sensation anterior thigh.  Specialty Comments:  MRI CERVICAL SPINE WITHOUT CONTRAST   TECHNIQUE: Multiplanar, multisequence MR imaging of the cervical spine was performed. No intravenous contrast was administered.   COMPARISON:  Prior chest imaging but no prior cervical or neck imaging available.   FINDINGS: Alignment: Normal to mildly exaggerated cervical lordosis. No significant spondylolisthesis.   Vertebrae: Mild hardware susceptibility artifact at C6-C7 and C6-C7 appears related to prior ACDF. No marrow edema or evidence of acute osseous abnormality. Background bone marrow signal is within normal limits.   Cord: Normal. Fairly capacious spinal canal at most levels.   Posterior Fossa, vertebral arteries, paraspinal tissues: Cervicomedullary junction is within normal limits. Negative visible posterior fossa. Preserved major vascular  flow voids in the neck. Fairly codominant vertebral arteries. Negative visible neck soft tissues and lung apices.   Disc levels:   C2-C3:  Negative.   C3-C4: Mild disc bulging and endplate spurring. Moderate to severe left and mild right facet hypertrophy. No spinal stenosis. Up to moderate left, mild right C4 foraminal stenosis.   C4-C5: Mild disc bulging and endplate spurring. Moderate to severe right facet hypertrophy. No spinal stenosis. But moderate to severe right C5 foraminal stenosis.   C5-C6: Prior ACDF. No spinal stenosis. Residual endplate spurring. Moderate bilateral C6 foraminal stenosis.   C6-C7: Prior ACDF. Mild endplate and facet hypertrophy. No spinal stenosis. Mild to moderate left and mild right residual C7 foraminal stenosis.   C7-T1:  Minor disc bulging and endplate spurring. No stenosis.   No visible upper thoracic spinal stenosis.   IMPRESSION: 1. Prior ACDF at C5-C6 and C6-C7 with no cervical spinal stenosis. Up to moderate residual neural foraminal stenosis at the Bilateral C6 and left C7 nerve levels.   2. 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 And similar left side facet arthropathy at C3-C4. Subsequent moderate left C4 and moderate to severe Right C5 neural foraminal stenosis.     Electronically Signed   By: Odessa Fleming M.D.   On: 12/12/2021 13:26  Imaging: protrusion or significant spinal or foraminal stenosis.   IMPRESSION: 1. Scoliosis and degenerative lumbar spondylosis with multilevel disc disease and facet disease. 2. Mild to moderate left lateral recess stenosis at L1-2. 3. Mild left lateral recess and left foraminal stenosis at L2-3. 4. Early spinal stenosis and mild bilateral lateral recess stenosis at L3-4. There is also mild left foraminal encroachment. 5. Right lateral recess stenosis and mild right foraminal stenosis at L4-5.     Electronically Signed   By: Rudie Meyer  M.D.   On: 04/27/2020 15:01     PMFS History: Patient Active Problem List   Diagnosis Date Noted   Peripheral vascular disease with stasis dermatitis 09/03/2021   Urine frequency 05/10/2021   Cellulitis of left leg 01/18/2021   Rash 01/18/2021   History of bilateral hip arthroplasty 01/03/2021   Upper respiratory tract infection 12/30/2020   Non-recurrent acute serous otitis media of right ear 12/30/2020   Pharyngitis 10/29/2020   Closed fracture of multiple pubic rami, left, sequela 08/16/2020   H/O adenomatous polyp of colon 05/23/2020   FH: colon cancer in relative diagnosed at >27 years old 05/23/2020   Spinal stenosis of lumbar region 05/03/2020   Impingement syndrome of right shoulder 05/03/2020   H/O total hip arthroplasty 12/06/2019   Status post total replacement of left hip 09/15/2019   Back pain 07/01/2018   DDD (degenerative disc disease), lumbosacral 07/01/2018  Venous insufficiency of both lower extremities 11/17/2016   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 level with radiculopathy 08/18/2016   Morbid obesity with BMI of 40.0-44.9, adult (HCC) 07/28/2016   Chronic pain syndrome 10/24/2015   Dysfunction of both eustachian tubes 07/26/2014   Calculus of left kidney 04/25/2014   Impaired fasting glucose 04/05/2014   History of bladder surgery 04/03/2014   History of hysterectomy 04/03/2014   Urge incontinence 04/03/2014   Asthma 07/28/2013   Nontoxic uninodular goiter 07/28/2013   Solitary pulmonary nodule 07/28/2013   Lumbar radiculopathy 04/20/2013   Insomnia 03/22/2013   Essential hypertension 08/18/2011   Tobacco use disorder 08/18/2011   Moderate episode of recurrent major depressive disorder (HCC) 04/03/2011   Other B-complex deficiencies 08/16/2010   Family history of cerebrovascular accident (CVA) 04/17/2010   Family history of malignant neoplasm of gastrointestinal tract 04/17/2010   Family history of other cardiovascular diseases(V17.49) 04/17/2010    Past Medical History:  Diagnosis Date   Allergy    Anemia    history of   Anxiety    Arthritis    Asthma    Atherosclerosis    Depression    Family history of adverse reaction to anesthesia    pt's mother felt everything and couldn't speak during a gallbladder surgery   GERD (gastroesophageal reflux disease)    History of hiatal hernia    History of kidney stones    Hypertension    Pneumonia    a few times   TIA (transient ischemic attack) 2012    Family History  Problem Relation Age of Onset   Anxiety disorder Mother    Depression Mother    Heart disease Mother    Hypertension Mother    Arrhythmia Mother    Cancer Father        colon, age greater than 47   Lung cancer Father    Migraines Sister    Cancer Daughter        nerve sheath sarcoma   Alcohol abuse Brother    Breast cancer Neg Hx     Past Surgical History:  Procedure Laterality Date   ABDOMINAL HYSTERECTOMY     abdominal   APPENDECTOMY     bladder tack     CARPAL TUNNEL RELEASE Bilateral    CERVICAL SPINE SURGERY     5-6, 6-7   CHOLECYSTECTOMY     COLONOSCOPY WITH PROPOFOL N/A 07/10/2020   Procedure: COLONOSCOPY WITH PROPOFOL;  Surgeon: Lanelle Bal, DO;  Location: [facility];  Service: Endoscopy;  Laterality: N/A;  12:15pm   ENDOVENOUS ABLATION SAPHENOUS VEIN W/ LASER Left 11/28/2021   endovenous laser ablation left greater saphenous vein by Coral Else MD   HAMMER TOE SURGERY Right    JOINT REPLACEMENT     TOTAL HIP 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   Procedure: LEFT TOTAL HIP ARTHROPLASTY -DIRECT ANTERIOR;  Surgeon: Eldred Manges, MD;  Location: [facility];  Service: Orthopedics;  Laterality: Left;   TOTAL HIP ARTHROPLASTY Right 12/05/2019   Procedure: RIGHT TOTAL HIP ARTHROPLASTY ANTERIOR APPROACH  DIRECT ANTERIOR;  Surgeon: Eldred Manges, MD;  Location: [facility];  Service: Orthopedics;  Laterality: Right;   Social History   Occupational History   Not on file  Tobacco Use   Smoking status:  Every Day    Packs/day: 0.50    Years: 23.00    Total pack years: 11.50    Types: Cigarettes   Smokeless tobacco: Never  Vaping Use   Vaping Use: Never used  Substance and Sexual Activity   Alcohol use: Yes    Comment: occ   Drug use: Never   Sexual activity: Not Currently

## 2022-02-03 ENCOUNTER — Ambulatory Visit: Payer: No Typology Code available for payment source | Admitting: Orthopedic Surgery

## 2022-02-03 ENCOUNTER — Other Ambulatory Visit: Payer: Self-pay | Admitting: Family Medicine

## 2022-02-03 ENCOUNTER — Other Ambulatory Visit: Payer: Self-pay | Admitting: *Deleted

## 2022-02-03 DIAGNOSIS — G8929 Other chronic pain: Secondary | ICD-10-CM

## 2022-02-03 DIAGNOSIS — M25552 Pain in left hip: Secondary | ICD-10-CM

## 2022-02-03 DIAGNOSIS — I10 Essential (primary) hypertension: Secondary | ICD-10-CM

## 2022-02-03 DIAGNOSIS — M5137 Other intervertebral disc degeneration, lumbosacral region: Secondary | ICD-10-CM

## 2022-02-03 MED ORDER — AMLODIPINE BESYLATE 5 MG PO TABS: 5.0000 mg | ORAL_TABLET | Freq: Every day | ORAL | 1 refills | Status: DC

## 2022-02-04 ENCOUNTER — Other Ambulatory Visit: Payer: Self-pay

## 2022-02-04 ENCOUNTER — Telehealth: Payer: Self-pay | Admitting: *Deleted

## 2022-02-04 ENCOUNTER — Ambulatory Visit (HOSPITAL_COMMUNITY)
Admission: RE | Admit: 2022-02-04 | Discharge: 2022-02-04 | Disposition: A | Discharge status: Home / Self Care | Payer: No Typology Code available for payment source | Source: Ambulatory Visit | Attending: Orthopaedic Surgery | Admitting: Orthopaedic Surgery

## 2022-02-04 DIAGNOSIS — M545 Low back pain, unspecified: Secondary | ICD-10-CM | POA: Insufficient documentation

## 2022-02-04 DIAGNOSIS — I10 Essential (primary) hypertension: Secondary | ICD-10-CM

## 2022-02-04 MED ORDER — METOPROLOL SUCCINATE ER 25 MG PO TB24: 25.0000 mg | ORAL_TABLET | Freq: Every day | ORAL | 1 refills | Status: DC

## 2022-02-04 NOTE — Telephone Encounter (Signed)
Last OV 12/31/21. Last RF 12/31/21. Next OV not scheduled

## 2022-02-04 NOTE — Telephone Encounter (Signed)
PA for hydrocodone/acetaminophen-IN PROCESS   Key:[id number]   PA CASE ID: [id number]

## 2022-02-05 ENCOUNTER — Other Ambulatory Visit (HOSPITAL_COMMUNITY): Payer: No Typology Code available for payment source

## 2022-02-06 ENCOUNTER — Ambulatory Visit (HOSPITAL_COMMUNITY)
Admission: RE | Admit: 2022-02-06 | Discharge: 2022-02-06 | Disposition: A | Discharge status: Home / Self Care | Payer: No Typology Code available for payment source | Source: Ambulatory Visit | Attending: Surgery | Admitting: Surgery

## 2022-02-06 ENCOUNTER — Encounter: Payer: Self-pay | Admitting: Surgery

## 2022-02-06 ENCOUNTER — Ambulatory Visit (INDEPENDENT_AMBULATORY_CARE_PROVIDER_SITE_OTHER): Payer: No Typology Code available for payment source | Admitting: Surgery

## 2022-02-06 VITALS — BP 143/91 | HR 73 | Temp 98.3°F | Resp 16

## 2022-02-06 DIAGNOSIS — I83811 Varicose veins of right lower extremities with pain: Secondary | ICD-10-CM

## 2022-02-06 NOTE — Progress Notes (Signed)
Patient name: Sydney [name] MRN: [id number] DOB: 1961-01-19 Sex: female  REASON FOR VISIT:     Postop   HISTORY OF PRESENT ILLNESS:   Sydney [name] is a 61 y.o. female who is status post right leg laser ablation and greater than 20 stab phlebectomies on 01/23/2022.  This was done for CEAP class II disease.  She has previously had left great saphenous laser ablation on 11/28/2021.  She is very pleased with the appearance of her legs.  She does not have any swelling.  CURRENT MEDICATIONS:    Current Outpatient Medications  Medication Sig Dispense Refill   albuterol (VENTOLIN HFA) 108 (90 Base) MCG/ACT inhaler Inhale 2 puffs into the lungs every 6 (six) hours as needed for wheezing or shortness of breath.     amLODipine (NORVASC) 5 MG tablet Take 1 tablet (5 mg total) by mouth daily. 90 tablet 1   augmented betamethasone dipropionate (DIPROLENE-AF) 0.05 % cream Apply topically.     cetirizine (ZYRTEC) 10 MG tablet Take 10 mg by mouth daily.     cyclobenzaprine (FLEXERIL) 10 MG tablet TAKE 1/2 TO 1 TABLET BY MOUTH 3 TIMES A DAY AS NEEDED FOR MUSCLE SPASMS 90 tablet 1   furosemide (LASIX) 20 MG tablet Take 1 tablet (20 mg total) by mouth daily as needed for edema. 90 tablet 3   HYDROcodone-acetaminophen (NORCO/VICODIN) 5-325 MG tablet Take 1 tablet by mouth every 4 (four) hours as needed for severe pain (put on file). 30 tablet 0   hydrOXYzine (VISTARIL) 25 MG capsule Take 1-2 capsules (25-50 mg total) by mouth every 8 (eight) hours as needed for anxiety. 60 capsule 1   LORazepam (ATIVAN) 1 MG tablet Take 1 tablet 30 minute before leaving house on day of office surgery.  Bring second tablet with you to office. 2 tablet 0   metoprolol succinate (TOPROL-XL) 25 MG 24 hr tablet Take 1 tablet (25 mg total) by mouth daily. 90 tablet 1   predniSONE (STERAPRED UNI-PAK 21 TAB) 10 MG (21) TBPK tablet Take 6,5,4,3,2,1 one tablet less each day 21 tablet 0   rosuvastatin  (CRESTOR) 10 MG tablet Take 10 mg by mouth every other day.     sertraline (ZOLOFT) 100 MG tablet Take 1.5 tablets (150 mg total) by mouth daily. 90 tablet 3   traZODone (DESYREL) 100 MG tablet TAKE 1 TO 2 TABLETS AT     BEDTIME AS NEEDED FOR SLEEP 180 tablet 1   Current Facility-Administered Medications  Medication Dose Route Frequency Provider Last Rate Last Admin   methylPREDNISolone acetate (DEPO-MEDROL) injection 80 mg  80 mg Other Once Tyrell Antonio, MD        REVIEW OF SYSTEMS:   [X]  denotes positive finding, [ ]  denotes negative finding Cardiac  Comments:  Chest pain or chest pressure:    Shortness of breath upon exertion:    Short of breath when lying flat:    Irregular heart rhythm:    Constitutional    Fever or chills:      PHYSICAL EXAM:   Vitals:   02/06/22 1017  Resp: 16  SpO2: 96%    GENERAL: The patient is a well-nourished female, in no acute distress. The vital signs are documented above. CARDIOVASCULAR: There is a regular rate and rhythm. PULMONARY: Non-labored respirations A few thrombosed varicosities are palpable in the leg.  STUDIES:   I have reviewed the ultrasound with the following findings: - No evidence of deep vein thrombosis from the right common femoral  through the popliteal veins.  - Successful ablation of the right Great Saphenous Vein from the proximal  thigh to distal thigh.  - Distance from SFJ to ablated segment of the GSV is 2.85 cm.  - Mid thigh varicosities coursing anteriorly are noncompressible.   MEDICAL ISSUES:   Status post bilateral laser ablation of the great saphenous vein and right leg stab phlebectomies: The patient is very pleased with the results.  Ultrasound confirms successful ablation of the saphenous vein.  Her swelling is resolved.  She will follow-up on an as-needed basis.  Charlena Cross, MD, FACS [facility] 2 [phone] Pager [phone]

## 2022-02-06 NOTE — Telephone Encounter (Signed)
Sydney [name] [facility] - PA Case ID: [id number] - Rx #: [id number] Need help? Call us at [phone] Outcome Approvedon July 18 Your PA request has been approved. Additional information will be provided in the approval communication. (Message 1145) Drug HYDROcodone-Acetaminophen 5-325MG  tablets Form [employer] Electronic PA Form (2017 NCPDP) Original Claim Info 75 MAX 7 DS PER90 DAYS THEN PA. PA REQ [id number] REQUIRES PRIOR AUTHORIZATION(PHARMACY HELP DESK [phone])

## 2022-02-13 ENCOUNTER — Ambulatory Visit (INDEPENDENT_AMBULATORY_CARE_PROVIDER_SITE_OTHER): Payer: No Typology Code available for payment source | Admitting: Orthopaedic Surgery

## 2022-02-13 ENCOUNTER — Ambulatory Visit (HOSPITAL_COMMUNITY): Payer: No Typology Code available for payment source

## 2022-02-13 ENCOUNTER — Encounter: Payer: Self-pay | Admitting: Orthopaedic Surgery

## 2022-02-13 DIAGNOSIS — M47816 Spondylosis without myelopathy or radiculopathy, lumbar region: Secondary | ICD-10-CM | POA: Diagnosis not present

## 2022-02-13 DIAGNOSIS — M5416 Radiculopathy, lumbar region: Secondary | ICD-10-CM | POA: Diagnosis not present

## 2022-02-13 NOTE — Addendum Note (Signed)
Addended by: Baird Kay on: 02/13/2022 02:06 PM   Modules accepted: Orders

## 2022-02-13 NOTE — Progress Notes (Signed)
Office Visit Note   Patient: Sydney [name]           Date of Birth: 05-13-1961           MRN: [id number] Visit Date: 02/13/2022              Requested by: Raliegh Ip, DO [address] [place],  [place] [address] PCP: Raliegh Ip, DO   Assessment & Plan: Visit Diagnoses:  1. Lumbar radiculopathy   2. Facet degeneration of lumbar region     Plan: Patient is requesting referral for facet injections which in past years gave her good relief.  Her symptoms are on the left where she has degenerative facet changes L3-4 L4-5.  She states she would like to have the radiofrequency ablation if she gets good relief with the facet injections similar to what she had done several years ago that gave her sustained relief on the right side.  Follow-Up Instructions: No follow-ups on file.   Orders:  No orders of the defined types were placed in this encounter.  No orders of the defined types were placed in this encounter.     Procedures: No procedures performed   Clinical Data: No additional findings.   Subjective: Chief Complaint  Patient presents with   Lower Back - Follow-up    MRI results    HPI 61 year old female returns with ongoing problems with her back on the left side.  MRI scan is available scan report below which shows significant foraminal extraforaminal problems on the left prominent at L3-4 L4-5.  She relates that she had epidurals in the past initially facet injections and then RFA and got much more relief on the right than left and right RFA treatment has extended for several years.  These were done [facility] [place].  She has been through exercises.  She has pain with sitting pain that radiates from her back down to her left knee.  Pain with rotation bending and twisting.  Review of Systems all the systems have been unchanged.   Objective: Vital Signs: There were no vitals taken for this visit.  Physical Exam Constitutional:      Appearance: She is  well-developed.  HENT:     Head: Normocephalic.     Right Ear: External ear normal.     Left Ear: External ear normal. There is no impacted cerumen.  Eyes:     Pupils: Pupils are equal, round, and reactive to light.  Neck:     Thyroid: No thyromegaly.     Trachea: No tracheal deviation.  Cardiovascular:     Rate and Rhythm: Normal rate.  Pulmonary:     Effort: Pulmonary effort is normal.  Abdominal:     Palpations: Abdomen is soft.  Musculoskeletal:     Cervical back: No rigidity.  Skin:    General: Skin is warm and dry.  Neurological:     Mental Status: She is alert and oriented to person, place, and time.  Psychiatric:        Behavior: Behavior normal.     Ortho Exam well-healed hip incision she is amatory with a cane.  Tenderness of sciatic notch palpation pain with rotation and lumbar flexion extension.  Specialty Comments:  MRI CERVICAL SPINE WITHOUT CONTRAST   TECHNIQUE: Multiplanar, multisequence MR imaging of the cervical spine was performed. No intravenous contrast was administered.   COMPARISON:  Prior chest imaging but no prior cervical or neck imaging available.   FINDINGS: Alignment: Normal  to mildly exaggerated cervical lordosis. No significant spondylolisthesis.   Vertebrae: Mild hardware susceptibility artifact at C6-C7 and C6-C7 appears related to prior ACDF. No marrow edema or evidence of acute osseous abnormality. Background bone marrow signal is within normal limits.   Cord: Normal. Fairly capacious spinal canal at most levels.   Posterior Fossa, vertebral arteries, paraspinal tissues: Cervicomedullary junction is within normal limits. Negative visible posterior fossa. Preserved major vascular flow voids in the neck. Fairly codominant vertebral arteries. Negative visible neck soft tissues and lung apices.   Disc levels:   C2-C3:  Negative.   C3-C4: Mild disc bulging and endplate spurring. Moderate to severe left and mild right facet  hypertrophy. No spinal stenosis. Up to moderate left, mild right C4 foraminal stenosis.   C4-C5: Mild disc bulging and endplate spurring. Moderate to severe right facet hypertrophy. No spinal stenosis. But moderate to severe right C5 foraminal stenosis.   C5-C6: Prior ACDF. No spinal stenosis. Residual endplate spurring. Moderate bilateral C6 foraminal stenosis.   C6-C7: Prior ACDF. Mild endplate and facet hypertrophy. No spinal stenosis. Mild to moderate left and mild right residual C7 foraminal stenosis.   C7-T1:  Minor disc bulging and endplate spurring. No stenosis.   No visible upper thoracic spinal stenosis.   IMPRESSION: 1. Prior ACDF at C5-C6 and C6-C7 with no cervical spinal stenosis. Up to moderate residual neural foraminal stenosis at the Bilateral C6 and left C7 nerve levels.   2. 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 And similar left side facet arthropathy at C3-C4. Subsequent moderate left C4 and moderate to severe Right C5 neural foraminal stenosis.     Electronically Signed   By: Odessa Fleming M.D.   On: 12/12/2021 13:26  Imaging:CLINICAL DATA:  Chronic low back pain with right leg pain and numbness.   EXAM: MRI LUMBAR SPINE WITHOUT CONTRAST   TECHNIQUE: Multiplanar, multisequence MR imaging of the lumbar spine was performed. No intravenous contrast was administered.   COMPARISON:  06/07/2014   FINDINGS: Segmentation: There are five lumbar type vertebral bodies. The last full intervertebral disc space is labeled L5-S1. This correlates with the prior study.   Alignment: Significant scoliotic curvature but normal overall alignment in the sagittal plane. Mild degenerative anterolisthesis of L5 is again demonstrated.   Vertebrae: Endplate reactive changes but no fractures or bone lesions. 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    Conus medullaris and cauda equina: Conus extends to the T12-L1 level. Conus and cauda equina appear normal.   Paraspinal and other soft tissues: No significant paraspinal or retroperitoneal findings.   Disc levels:   L1-2: Asymmetric left-sided facet disease with thickening and buckling of the ligamentum flavum. There is mild mass effect on the left side of the thecal sac. There is also mild to moderate left lateral recess stenosis. No significant spinal or foraminal stenosis.   L2-3: Bulging annulus, osteophytic ridging and facet disease asymmetric on the left side. No significant spinal stenosis. Mild left lateral recess and left foraminal stenosis.   L3-4: Bulging annulus, osteophytic ridging and advanced facet disease contributing to early spinal stenosis and mild bilateral lateral recess stenosis. No significant foraminal stenosis. Mild left foraminal encroachment.   L4-5: Advanced facet disease, right greater than left in conjunction with a bulging annulus contributing to right lateral recess stenosis and mild right foraminal stenosis. No significant spinal or left foraminal stenosis.   L5-S1: Severe disc disease  and facet disease but no focal disc protrusion or significant spinal or foraminal stenosis.   IMPRESSION: 1. Scoliosis and degenerative lumbar spondylosis with multilevel disc disease and facet disease. 2. Mild to moderate left lateral recess stenosis at L1-2. 3. Mild left lateral recess and left foraminal stenosis at L2-3. 4. Early spinal stenosis and mild bilateral lateral recess stenosis at L3-4. There is also mild left foraminal encroachment. 5. Right lateral recess stenosis and mild right foraminal stenosis at L4-5.     Electronically Signed   By: Rudie Meyer M.D.   On: 04/27/2020 15:01    PMFS History: Patient Active Problem List   Diagnosis Date Noted   Facet degeneration of lumbar region 02/13/2022   Peripheral vascular disease with  stasis dermatitis 09/03/2021   Urine frequency 05/10/2021   Cellulitis of left leg 01/18/2021   Rash 01/18/2021   History of bilateral hip arthroplasty 01/03/2021   Upper respiratory tract infection 12/30/2020   Non-recurrent acute serous otitis media of right ear 12/30/2020   Pharyngitis 10/29/2020   Closed fracture of multiple pubic rami, left, sequela 08/16/2020   H/O adenomatous polyp of colon 05/23/2020   FH: colon cancer in relative diagnosed at >31 years old 05/23/2020   Spinal stenosis of lumbar region 05/03/2020   Impingement syndrome of right shoulder 05/03/2020   H/O total hip arthroplasty 12/06/2019   Status post total replacement of left hip 09/15/2019   Back pain 07/01/2018   DDD (degenerative disc disease), lumbosacral 07/01/2018   Venous insufficiency of both lower extremities 11/17/2016   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 level with radiculopathy 08/18/2016   Morbid obesity with BMI of 40.0-44.9, adult (HCC) 07/28/2016   Chronic pain syndrome 10/24/2015   Dysfunction of both eustachian tubes 07/26/2014   Calculus of left kidney 04/25/2014   Impaired fasting glucose 04/05/2014   History of bladder surgery 04/03/2014   History of hysterectomy 04/03/2014   Urge incontinence 04/03/2014   Asthma 07/28/2013   Nontoxic uninodular goiter 07/28/2013   Solitary pulmonary nodule 07/28/2013   Lumbar radiculopathy 04/20/2013   Insomnia 03/22/2013   Essential hypertension 08/18/2011   Tobacco use disorder 08/18/2011   Moderate episode of recurrent major depressive disorder (HCC) 04/03/2011   Other B-complex deficiencies 08/16/2010   Family history of cerebrovascular accident (CVA) 04/17/2010   Family history of malignant neoplasm of gastrointestinal tract 04/17/2010   Family history of other cardiovascular diseases(V17.49) 04/17/2010   Past Medical History:  Diagnosis Date   Allergy    Anemia    history of   Anxiety    Arthritis    Asthma    Atherosclerosis     Depression    Family history of adverse reaction to anesthesia    pt's mother felt everything and couldn't speak during a gallbladder surgery   GERD (gastroesophageal reflux disease)    History of hiatal hernia    History of kidney stones    Hypertension    Pneumonia    a few times   TIA (transient ischemic attack) 2012    Family History  Problem Relation Age of Onset   Anxiety disorder Mother    Depression Mother    Heart disease Mother    Hypertension Mother    Arrhythmia Mother    Cancer Father        colon, age greater than 34   Lung cancer Father    Migraines Sister    Cancer Daughter        nerve sheath sarcoma  Alcohol abuse Brother    Breast cancer Neg Hx     Past Surgical History:  Procedure Laterality Date   ABDOMINAL HYSTERECTOMY     abdominal   APPENDECTOMY     bladder tack     CARPAL TUNNEL RELEASE Bilateral    CERVICAL SPINE SURGERY     5-6, 6-7   CHOLECYSTECTOMY     COLONOSCOPY WITH PROPOFOL N/A 07/10/2020   Procedure: COLONOSCOPY WITH PROPOFOL;  Surgeon: Lanelle Bal, DO;  Location: [facility];  Service: Endoscopy;  Laterality: N/A;  12:15pm   ENDOVENOUS ABLATION SAPHENOUS VEIN W/ LASER Left 11/28/2021   endovenous laser ablation left greater saphenous vein by Coral Else MD   HAMMER TOE SURGERY Right    JOINT REPLACEMENT     TOTAL HIP 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   Procedure: LEFT TOTAL HIP ARTHROPLASTY -DIRECT ANTERIOR;  Surgeon: Eldred Manges, MD;  Location: [facility] OR;  Service: Orthopedics;  Laterality: Left;   TOTAL HIP ARTHROPLASTY Right 12/05/2019   Procedure: RIGHT TOTAL HIP ARTHROPLASTY ANTERIOR APPROACH  DIRECT ANTERIOR;  Surgeon: Eldred Manges, MD;  Location: [facility] OR;  Service: Orthopedics;  Laterality: Right;   Social History   Occupational History   Not on file  Tobacco Use   Smoking status: Every Day    Packs/day: 0.50    Years: 23.00    Total pack years: 11.50    Types: Cigarettes   Smokeless tobacco: Never  Vaping Use    Vaping Use: Never used  Substance and Sexual Activity   Alcohol use: Yes    Comment: occ   Drug use: Never   Sexual activity: Not Currently

## 2022-02-17 NOTE — Procedures (Signed)
Cervical Epidural Steroid Injection - Interlaminar Approach with Fluoroscopic Guidance  Patient: Sydney [name]      Date of Birth: Jul 07, 1961 MRN: [id number] PCP: Raliegh Ip, DO      Visit Date: 01/14/2022   Universal Protocol:    Date/Time: 02/17/2309:41 PM  Consent Given By: the patient  Position: PRONE  Additional Comments: Vital signs were monitored before and after the procedure. Patient was prepped and draped in the usual sterile fashion. The correct patient, procedure, and site was verified.   Injection Procedure Details:   Procedure diagnoses: Cervical radiculopathy [M54.12]    Meds Administered:  Meds ordered this encounter  Medications   methylPREDNISolone acetate (DEPO-MEDROL) injection 80 mg     Laterality: Right  Location/Site: C7-T1  Needle: 3.5 in., 20 ga. Tuohy  Needle Placement: Paramedian epidural space  Findings:  -Comments: Excellent flow of contrast into the epidural space.  Procedure Details: Using a paramedian approach from the side mentioned above, the region overlying the inferior lamina was localized under fluoroscopic visualization and the soft tissues overlying this structure were infiltrated with 4 ml. of 1% Lidocaine without Epinephrine. A # 20 gauge, Tuohy needle was inserted into the epidural space using a paramedian approach.  The epidural space was localized using loss of resistance along with contralateral oblique bi-planar fluoroscopic views.  After negative aspirate for air, blood, and CSF, a 2 ml. volume of Isovue-250 was injected into the epidural space and the flow of contrast was observed. Radiographs were obtained for documentation purposes.   The injectate was administered into the level noted above.  Additional Comments:  The patient tolerated the procedure well Dressing: 2 x 2 sterile gauze and Band-Aid    Post-procedure details: Patient was observed during the procedure. Post-procedure instructions were  reviewed.  Patient left the clinic in stable condition.

## 2022-02-17 NOTE — Progress Notes (Signed)
Sydney [name] - 61 y.o. female MRN [id number]  Date of birth: 03/17/1961  Office Visit Note: Visit Date: 01/14/2022 PCP: Raliegh Ip, DO Referred by: Raliegh Ip, DO  Subjective: Chief Complaint  Patient presents with   Neck - Pain   Right Shoulder - Pain   Left Shoulder - Pain   HPI:  Sydney [name] is a 61 y.o. female who comes in today at the request of Dr. Burnard Bunting for planned Right C7-T1 Cervical Interlaminar epidural steroid injection with fluoroscopic guidance.  The patient has failed conservative care including home exercise, medications, time and activity modification.  This injection will be diagnostic and hopefully therapeutic.  Please see requesting physician notes for further details and justification.    ROS Otherwise per HPI.  Assessment & Plan: Visit Diagnoses:    ICD-10-CM   1. Cervical radiculopathy  M54.12 XR C-ARM NO REPORT    Epidural Steroid injection    methylPREDNISolone acetate (DEPO-MEDROL) injection 80 mg      Plan: No additional findings.   Meds & Orders:  Meds ordered this encounter  Medications   methylPREDNISolone acetate (DEPO-MEDROL) injection 80 mg    Orders Placed This Encounter  Procedures   XR C-ARM NO REPORT   Epidural Steroid injection    Follow-up: Return for visit to requesting provider as needed.   Procedures: No procedures performed  Cervical Epidural Steroid Injection - Interlaminar Approach with Fluoroscopic Guidance  Patient: Sydney [name]      Date of Birth: 12-Dec-1960 MRN: [id number] PCP: Raliegh Ip, DO      Visit Date: 01/14/2022   Universal Protocol:    Date/Time: 02/17/2309:41 PM  Consent Given By: the patient  Position: PRONE  Additional Comments: Vital signs were monitored before and after the procedure. Patient was prepped and draped in the usual sterile fashion. The correct patient, procedure, and site was verified.   Injection Procedure Details:   Procedure  diagnoses: Cervical radiculopathy [M54.12]    Meds Administered:  Meds ordered this encounter  Medications   methylPREDNISolone acetate (DEPO-MEDROL) injection 80 mg     Laterality: Right  Location/Site: C7-T1  Needle: 3.5 in., 20 ga. Tuohy  Needle Placement: Paramedian epidural space  Findings:  -Comments: Excellent flow of contrast into the epidural space.  Procedure Details: Using a paramedian approach from the side mentioned above, the region overlying the inferior lamina was localized under fluoroscopic visualization and the soft tissues overlying this structure were infiltrated with 4 ml. of 1% Lidocaine without Epinephrine. A # 20 gauge, Tuohy needle was inserted into the epidural space using a paramedian approach.  The epidural space was localized using loss of resistance along with contralateral oblique bi-planar fluoroscopic views.  After negative aspirate for air, blood, and CSF, a 2 ml. volume of Isovue-250 was injected into the epidural space and the flow of contrast was observed. Radiographs were obtained for documentation purposes.   The injectate was administered into the level noted above.  Additional Comments:  The patient tolerated the procedure well Dressing: 2 x 2 sterile gauze and Band-Aid    Post-procedure details: Patient was observed during the procedure. Post-procedure instructions were reviewed.  Patient left the clinic in stable condition.   Clinical History: MRI CERVICAL SPINE WITHOUT CONTRAST   TECHNIQUE: Multiplanar, multisequence MR imaging of the cervical spine was performed. No intravenous contrast was administered.   COMPARISON:  Prior chest imaging but no prior cervical or neck imaging available.   FINDINGS: Alignment: Normal to mildly  exaggerated cervical lordosis. No significant spondylolisthesis.   Vertebrae: Mild hardware susceptibility artifact at C6-C7 and C6-C7 appears related to prior ACDF. No marrow edema or evidence of  acute osseous abnormality. Background bone marrow signal is within normal limits.   Cord: Normal. Fairly capacious spinal canal at most levels.   Posterior Fossa, vertebral arteries, paraspinal tissues: Cervicomedullary junction is within normal limits. Negative visible posterior fossa. Preserved major vascular flow voids in the neck. Fairly codominant vertebral arteries. Negative visible neck soft tissues and lung apices.   Disc levels:   C2-C3:  Negative.   C3-C4: Mild disc bulging and endplate spurring. Moderate to severe left and mild right facet hypertrophy. No spinal stenosis. Up to moderate left, mild right C4 foraminal stenosis.   C4-C5: Mild disc bulging and endplate spurring. Moderate to severe right facet hypertrophy. No spinal stenosis. But moderate to severe right C5 foraminal stenosis.   C5-C6: Prior ACDF. No spinal stenosis. Residual endplate spurring. Moderate bilateral C6 foraminal stenosis.   C6-C7: Prior ACDF. Mild endplate and facet hypertrophy. No spinal stenosis. Mild to moderate left and mild right residual C7 foraminal stenosis.   C7-T1:  Minor disc bulging and endplate spurring. No stenosis.   No visible upper thoracic spinal stenosis.   IMPRESSION: 1. Prior ACDF at C5-C6 and C6-C7 with no cervical spinal stenosis. Up to moderate residual neural foraminal stenosis at the Bilateral C6 and left C7 nerve levels.   2. 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 And similar left side facet arthropathy at C3-C4. Subsequent moderate left C4 and moderate to severe Right C5 neural foraminal stenosis.     Electronically Signed   By: Odessa Fleming M.D.   On: 12/12/2021 13:26     Objective:  VS:  HT:    WT:   BMI:     BP:100/67  HR:(!) 103bpm  TEMP: ( )  RESP:  Physical Exam Vitals and nursing note reviewed.  Constitutional:      General: She is not in acute distress.    Appearance: Normal appearance.  She is not ill-appearing.  HENT:     Head: Normocephalic and atraumatic.     Right Ear: External ear normal.     Left Ear: External ear normal.  Eyes:     Extraocular Movements: Extraocular movements intact.  Cardiovascular:     Rate and Rhythm: Normal rate.     Pulses: Normal pulses.  Musculoskeletal:     Cervical back: Tenderness present. No rigidity.     Right lower leg: No edema.     Left lower leg: No edema.     Comments: Patient has good strength in the upper extremities including 5 out of 5 strength in wrist extension long finger flexion and APB.  There is no atrophy of the hands intrinsically.  There is a negative Hoffmann's test.   Lymphadenopathy:     Cervical: No cervical adenopathy.  Skin:    Findings: No erythema, lesion or rash.  Neurological:     General: No focal deficit present.     Mental Status: She is alert and oriented to person, place, and time.     Sensory: No sensory deficit.     Motor: No weakness or abnormal muscle tone.     Coordination: Coordination normal.  Psychiatric:        Mood and Affect: Mood normal.        Behavior: Behavior normal.      Imaging: No  results found.

## 2022-02-20 ENCOUNTER — Ambulatory Visit: Payer: No Typology Code available for payment source | Admitting: Physical Medicine and Rehabilitation

## 2022-02-20 ENCOUNTER — Encounter: Payer: Self-pay | Admitting: Physical Medicine and Rehabilitation

## 2022-02-20 VITALS — BP 146/86 | HR 108

## 2022-02-20 DIAGNOSIS — M47816 Spondylosis without myelopathy or radiculopathy, lumbar region: Secondary | ICD-10-CM

## 2022-02-20 DIAGNOSIS — M5416 Radiculopathy, lumbar region: Secondary | ICD-10-CM

## 2022-02-20 DIAGNOSIS — M5116 Intervertebral disc disorders with radiculopathy, lumbar region: Secondary | ICD-10-CM | POA: Diagnosis not present

## 2022-02-20 DIAGNOSIS — M4726 Other spondylosis with radiculopathy, lumbar region: Secondary | ICD-10-CM

## 2022-02-20 DIAGNOSIS — R269 Unspecified abnormalities of gait and mobility: Secondary | ICD-10-CM

## 2022-02-20 NOTE — Progress Notes (Signed)
Pt state lower back pain that travels down to her left knee. Pt state walking makes the pain worse. Pt state she takes pain meds to help ease her pain.  Numeric Pain Rating Scale and Functional Assessment Average Pain 10 Pain Right Now 4 My pain is constant, sharp, burning, dull, stabbing, tingling, and aching Pain is worse with: walking, some activites, and laying down Pain improves with: heat/ice, medication, and injections   In the last MONTH (on 0-10 scale) has pain interfered with the following?  1. General activity like being  able to carry out your everyday physical activities such as walking, climbing stairs, carrying groceries, or moving a chair?  Rating(5)  2. Relation with others like being able to carry out your usual social activities and roles such as  activities at home, at work and in your community. Rating(6)  3. Enjoyment of life such that you have  been bothered by emotional problems such as feeling anxious, depressed or irritable?  Rating(7)

## 2022-02-20 NOTE — Progress Notes (Signed)
Sydney [name] - 61 y.o. female MRN [id number]  Date of birth: 1961-02-23  Office Visit Note: Visit Date: 02/20/2022 PCP: Raliegh Ip, DO Referred by: Raliegh Ip, DO  Subjective: Chief Complaint  Patient presents with   Lower Back - Pain   Right Knee - Pain   HPI: Sydney [name] is a 61 y.o. female who comes in today Chronic, worsening and severe left sided lower back pain radiating to buttock, hip and thigh. Pain ongoing for many years and is exacerbated by standing, sitting in chair and activity. She describes pain as sore, aching and tender sensation, currently rates as 7 out of 10. She reports some relief of pain with rest and use of medications, currently taking Norco that is prescribed by her primary care provider Dr. Delynn Flavin. Patient states her pain often becomes so severe that she feels like she could pass out. Patient does have history of formal physical therapy at [facility] in 2022, some relief of pain with these treatments. Patients recent lumbar MRI imaging exhibits multilevel facet arthropathy, left paracentral and subarticular protrusion which narrows the left lateral recess at L2-L3, there is also extreme lateral disc protrusion at L3-L4 causing bilateral recess narrowing. No high grade spinal canal stenosis noted. Patient states she was previously treated at [facility] where she underwent multiple lumbar epidural steroid injections and radiofrequency ablation. She was treated more recently at [facility] where she also underwent multiple lumbar epidural steroid injections and radiofrequency ablation procedure. Patient states radiofrequency ablation has helped to alleviate right sided lower back pain. Dr. Annell Greening has been managing patient for several years from an orthopedic standpoint, history of some relief of pain with greater trochanter injection in 2022. Patient did undergo bilateral total hip arthoplasty in 2021 by Dr. Ophelia Charter. Patient is  currently using cane to assist with ambulation. States she is going out of town on 03/06/22 and would like to have injection before her trip. Patient denies focal weakness, numbness and tingling. Patient denies recent trauma or falls.    Review of Systems  Musculoskeletal:  Positive for back pain and myalgias.  Neurological:  Negative for tingling, sensory change, focal weakness and weakness.  All other systems reviewed and are negative.  Otherwise per HPI.  Assessment & Plan: Visit Diagnoses:    ICD-10-CM   1. Lumbar radiculopathy  M54.16 DG INJECT DIAG/THERA/INC NEEDLE/CATH/PLC EPI/LUMB/SAC W/IMG    2. Intervertebral disc disorders with radiculopathy, lumbar region  M51.16 DG INJECT DIAG/THERA/INC NEEDLE/CATH/PLC EPI/LUMB/SAC W/IMG    3. Other spondylosis with radiculopathy, lumbar region  M47.26 DG INJECT DIAG/THERA/INC NEEDLE/CATH/PLC EPI/LUMB/SAC W/IMG    4. Facet arthropathy, lumbar  M47.816 DG INJECT DIAG/THERA/INC NEEDLE/CATH/PLC EPI/LUMB/SAC W/IMG    5. Gait abnormality  R26.9        Plan: Findings:  Chronic, worsening and severe left sided lower back pain radiating to buttock, hip and thigh. Patient continues to have severe pain despite good conservative therapies such as formal physical therapy, rest and use of medications. Patients clinical presentation and exam are consistent with L3 nerve pattern. Next step is to perform diagnostic and hopefully therapeutic left L3 transforaminal epidural steroid injection under fluoroscopic guidance. Patient is requesting injection before 8/17, we feel her best option is to have procedure performed at [facility] as we do not have availability before her trip. I did place injection order with [facility] today. If injection helps with radicular symptoms and lower back pain remains we did discuss possibility of  repeating diagnostic facet blocks and possible radiofrequency ablation procedure. Patient encouraged to remain active and continue  using cane. No red flag symptoms noted upon exam today.     Meds & Orders: No orders of the defined types were placed in this encounter.   Orders Placed This Encounter  Procedures   DG INJECT DIAG/THERA/INC NEEDLE/CATH/PLC EPI/LUMB/SAC W/IMG    Follow-up: Return if symptoms worsen or fail to improve.   Procedures: No procedures performed      Clinical History: EXAM: MRI LUMBAR SPINE WITHOUT CONTRAST   TECHNIQUE: Multiplanar, multisequence MR imaging of the lumbar spine was performed. No intravenous contrast was administered.   COMPARISON:  04/27/2020   FINDINGS: Segmentation:  5 lumbar-type vertebral bodies.   Alignment: Scoliosis, with dextroscoliosis of the lumbar spine. Trace retrolisthesis of T12 on L1 and L1 on L2 and trace anterolisthesis of L5 on S1, unchanged.   Vertebrae: No acute fracture or suspicious osseous lesion. Endplate degenerative changes. 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   Conus medullaris and cauda equina: Conus extends to the L1 level. Conus and cauda equina appear normal.   Paraspinal and other soft tissues: Asymmetric fatty atrophy of the superior left psoas and inferior right-greater-than-left paraspinous muscles.   Disc levels:   T12-L1: Mild disc bulge. Mild facet arthropathy. No spinal canal stenosis or neural foraminal narrowing.   L1-L2: Mild left eccentric disc bulge. Left-greater-than-right facet arthropathy. Narrowing of the left lateral recess. No spinal canal stenosis. Mild left neural foraminal narrowing, which has progressed from the prior exam.   L2-L3: Mild disc bulge with left paracentral and subarticular protrusion which narrows the left lateral recess. Severe left and moderate right facet arthropathy. No spinal canal stenosis. Mild-to-moderate left neural foraminal narrowing which has progressed from the prior exam   L3-L4: Mild disc  bulge with left foraminal and extreme lateral disc protrusion. Moderate facet arthropathy. Narrowing of the lateral recesses. No spinal canal stenosis. Moderate left neural foraminal narrowing, which has progressed from the prior exam.   L4-L5: Mild disc bulge with right foraminal and extreme lateral protrusion. Severe right and mild left facet arthropathy. Narrowing of the right lateral recess. No spinal canal stenosis. Mild right neural foraminal narrowing, unchanged.   L5-S1: Mild disc bulge. Moderate facet arthropathy. No spinal canal stenosis. Mild right neural foraminal narrowing, which has progressed from the prior exam.   IMPRESSION: 1. Progression of previously noted asymmetric degenerative changes with progressive left neural foraminal narrowing in the upper lumbar spine, which is now moderate at L3-L4, mild-to-moderate at L2-L3, and mild at L1-L2. Narrowing of the left lateral recesses at L1-L2 and L2-L3 and bilateral lateral recesses L3-L4 could affect the descending left L2 and L3 nerve roots and bilateral L4 nerve roots, respectively. 2. Mild right neural foraminal narrowing at L4-L5 and L5-S1. Narrowing of the right lateral recess at L4-L5 could affect the descending right L5 nerve roots.     Electronically Signed   By: Merilyn Baba M.D.   On: 02/05/2022 13:58   She reports that she has been smoking cigarettes. She has a 11.50 pack-year smoking history. She has never used smokeless tobacco. No results for input(s): "HGBA1C", "LABURIC" in the last 8760 hours.  Objective:  VS:  HT:    WT:   BMI:     BP:(!) 146/86  HR:(!) 108bpm  TEMP: ( )  RESP:  Physical Exam Vitals and nursing note reviewed.  HENT:  Head: Normocephalic and atraumatic.     Right Ear: External ear normal.     Left Ear: External ear normal.     Nose: Nose normal.     Mouth/Throat:     Mouth: Mucous membranes are moist.  Eyes:     Extraocular Movements: Extraocular movements intact.   Cardiovascular:     Rate and Rhythm: Normal rate.     Pulses: Normal pulses.  Pulmonary:     Effort: Pulmonary effort is normal.  Abdominal:     General: Abdomen is flat. There is no distension.  Musculoskeletal:        General: Tenderness present.     Cervical back: Normal range of motion.     Comments: Pt is slow to rise from seated position to standing. Good lumbar range of motion. Strong distal strength without clonus, no pain upon palpation of greater trochanters. Sensation intact bilaterally. Dysesthesias noted to left L3 dermatome. Ambulates with cane, gait slow.  Skin:    General: Skin is warm and dry.     Capillary Refill: Capillary refill takes less than 2 seconds.  Neurological:     Mental Status: She is alert and oriented to person, place, and time.     Gait: Gait abnormal.  Psychiatric:        Mood and Affect: Mood normal.        Behavior: Behavior normal.     Ortho Exam  Imaging: No results found.  Past Medical/Family/Surgical/Social History: Medications & Allergies reviewed per EMR, new medications updated. Patient Active Problem List   Diagnosis Date Noted   Facet degeneration of lumbar region 02/13/2022   Peripheral vascular disease with stasis dermatitis 09/03/2021   Urine frequency 05/10/2021   Cellulitis of left leg 01/18/2021   Rash 01/18/2021   History of bilateral hip arthroplasty 01/03/2021   Upper respiratory tract infection 12/30/2020   Non-recurrent acute serous otitis media of right ear 12/30/2020   Pharyngitis 10/29/2020   Closed fracture of multiple pubic rami, left, sequela 08/16/2020   H/O adenomatous polyp of colon 05/23/2020   FH: colon cancer in relative diagnosed at >108 years old 05/23/2020   Spinal stenosis of lumbar region 05/03/2020   Impingement syndrome of right shoulder 05/03/2020   H/O total hip arthroplasty 12/06/2019   Status post total replacement of left hip 09/15/2019   Back pain 07/01/2018   DDD (degenerative disc  disease), lumbosacral 07/01/2018   Venous insufficiency of both lower extremities 11/17/2016   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 level with radiculopathy 08/18/2016   Morbid obesity with BMI of 40.0-44.9, adult ([place]) 07/28/2016   Chronic pain syndrome 10/24/2015   Dysfunction of both eustachian tubes 07/26/2014   Calculus of left kidney 04/25/2014   Impaired fasting glucose 04/05/2014   History of bladder surgery 04/03/2014   History of hysterectomy 04/03/2014   Urge incontinence 04/03/2014   Asthma 07/28/2013   Nontoxic uninodular goiter 07/28/2013   Solitary pulmonary nodule 07/28/2013   Lumbar radiculopathy 04/20/2013   Insomnia 03/22/2013   Essential hypertension 08/18/2011   Tobacco use disorder 08/18/2011   Moderate episode of recurrent major depressive disorder ([place]) 04/03/2011   Other B-complex deficiencies 08/16/2010   Family history of cerebrovascular accident (CVA) 04/17/2010   Family history of malignant neoplasm of gastrointestinal tract 04/17/2010   Family history of other cardiovascular diseases(V17.49) 04/17/2010   Past Medical History:  Diagnosis Date   Allergy    Anemia    history of   Anxiety    Arthritis  Asthma    Atherosclerosis    Depression    Family history of adverse reaction to anesthesia    pt's mother felt everything and couldn't speak during a gallbladder surgery   GERD (gastroesophageal reflux disease)    History of hiatal hernia    History of kidney stones    Hypertension    Pneumonia    a few times   TIA (transient ischemic attack) 2012   Family History  Problem Relation Age of Onset   Anxiety disorder Mother    Depression Mother    Heart disease Mother    Hypertension Mother    Arrhythmia Mother    Cancer Father        colon, age greater than 33   Lung cancer Father    Migraines Sister    Cancer Daughter        nerve sheath sarcoma   Alcohol abuse Brother    Breast cancer Neg Hx    Past Surgical History:  Procedure  Laterality Date   ABDOMINAL HYSTERECTOMY     abdominal   APPENDECTOMY     bladder tack     CARPAL TUNNEL RELEASE Bilateral    CERVICAL SPINE SURGERY     5-6, 6-7   CHOLECYSTECTOMY     COLONOSCOPY WITH PROPOFOL N/A 07/10/2020   Procedure: COLONOSCOPY WITH PROPOFOL;  Surgeon: Lanelle Bal, DO;  Location: [facility];  Service: Endoscopy;  Laterality: N/A;  12:15pm   ENDOVENOUS ABLATION SAPHENOUS VEIN W/ LASER Left 11/28/2021   endovenous laser ablation left greater saphenous vein by Coral Else MD   HAMMER TOE SURGERY Right    JOINT REPLACEMENT     TOTAL HIP 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   Procedure: LEFT TOTAL HIP ARTHROPLASTY -DIRECT ANTERIOR;  Surgeon: Eldred Manges, MD;  Location: [facility] OR;  Service: Orthopedics;  Laterality: Left;   TOTAL HIP ARTHROPLASTY Right 12/05/2019   Procedure: RIGHT TOTAL HIP ARTHROPLASTY ANTERIOR APPROACH  DIRECT ANTERIOR;  Surgeon: Eldred Manges, MD;  Location: [facility] OR;  Service: Orthopedics;  Laterality: Right;   Social History   Occupational History   Not on file  Tobacco Use   Smoking status: Every Day    Packs/day: 0.50    Years: 23.00    Total pack years: 11.50    Types: Cigarettes   Smokeless tobacco: Never  Vaping Use   Vaping Use: Never used  Substance and Sexual Activity   Alcohol use: Yes    Comment: occ   Drug use: Never   Sexual activity: Not Currently

## 2022-02-26 ENCOUNTER — Telehealth: Payer: Self-pay | Admitting: Physical Medicine and Rehabilitation

## 2022-02-26 NOTE — Telephone Encounter (Signed)
Pt need a call right away to set an appt before her vacation. Please call pt at [phone]

## 2022-02-27 ENCOUNTER — Telehealth: Payer: Self-pay | Admitting: Family Medicine

## 2022-02-27 DIAGNOSIS — I10 Essential (primary) hypertension: Secondary | ICD-10-CM

## 2022-02-27 DIAGNOSIS — E7800 Pure hypercholesterolemia, unspecified: Secondary | ICD-10-CM

## 2022-02-27 NOTE — Telephone Encounter (Signed)
Informed pt that we cannot fill these medication without approval from Dr. Nadine Counts.  Epi pen is not on her med list and Norco is controlled.  Pt states that she has about 10-11 Hydrocodone left. She takes 1-2 per day. Pt seen Dr. Nadine Counts in June and tox/csa were completed.  Scheduled CPE for Sept 15th and future labs placed today.  Pt aware that Dr. Nadine Counts will be out of the office until tomorrow and that we will call her once addressed.  Also, informed pt that she may need to be seen for Hydrocodone refill. Pt is going out of town next [place] and also having a back injection next Monday for herniated disc.

## 2022-02-27 NOTE — Telephone Encounter (Signed)
  Prescription Request  02/27/2022   What is the name of the medication or equipment? EPI PEN and HYDROCODONE  Have you contacted your pharmacy to request a refill? YES  Which pharmacy would you like this sent to? [facility]  Pt says she is going on vacation next week and needs refill on both Rx's. Says her Epi Pen Rx expired and she is almost out of her hydrocodone that was prescribed to her at her last visit in June that she takes PRN.

## 2022-02-28 NOTE — Telephone Encounter (Signed)
Sydney [name] knows that norco is a controlled substance and that it requires a face to face visit EVERY time it needs a refill.  This is IN THE controlled substance she signed.  If she needs a refill, she needs to be seen, as she is requiring these more frequently than before despite being treated by her specialist for the back pain.  She may need referral to pain management if her usage is increasing.

## 2022-03-03 ENCOUNTER — Other Ambulatory Visit: Payer: No Typology Code available for payment source

## 2022-03-03 ENCOUNTER — Ambulatory Visit
Admission: RE | Admit: 2022-03-03 | Discharge: 2022-03-03 | Disposition: A | Discharge status: Home / Self Care | Payer: No Typology Code available for payment source | Source: Ambulatory Visit | Attending: Physical Medicine and Rehabilitation | Admitting: Physical Medicine and Rehabilitation

## 2022-03-03 MED ORDER — IOPAMIDOL (ISOVUE-M 200) INJECTION 41%
1.0000 mL | Freq: Once | INTRAMUSCULAR | Status: AC
  Administered 2022-03-03: 1 mL via EPIDURAL

## 2022-03-03 MED ORDER — METHYLPREDNISOLONE ACETATE 40 MG/ML INJ SUSP (RADIOLOG
80.0000 mg | Freq: Once | INTRAMUSCULAR | Status: AC
  Administered 2022-03-03: 80 mg via EPIDURAL

## 2022-03-03 NOTE — Discharge Instructions (Signed)

## 2022-03-04 ENCOUNTER — Other Ambulatory Visit: Payer: No Typology Code available for payment source

## 2022-03-05 ENCOUNTER — Telehealth (INDEPENDENT_AMBULATORY_CARE_PROVIDER_SITE_OTHER): Payer: No Typology Code available for payment source | Admitting: Family Medicine

## 2022-03-05 ENCOUNTER — Encounter: Payer: Self-pay | Admitting: Family Medicine

## 2022-03-05 DIAGNOSIS — E7800 Pure hypercholesterolemia, unspecified: Secondary | ICD-10-CM

## 2022-03-05 DIAGNOSIS — M5137 Other intervertebral disc degeneration, lumbosacral region: Secondary | ICD-10-CM

## 2022-03-05 DIAGNOSIS — M25552 Pain in left hip: Secondary | ICD-10-CM

## 2022-03-05 DIAGNOSIS — M5442 Lumbago with sciatica, left side: Secondary | ICD-10-CM | POA: Diagnosis not present

## 2022-03-05 DIAGNOSIS — G8929 Other chronic pain: Secondary | ICD-10-CM

## 2022-03-05 DIAGNOSIS — Z87892 Personal history of anaphylaxis: Secondary | ICD-10-CM

## 2022-03-05 MED ORDER — HYDROCODONE-ACETAMINOPHEN 5-325 MG PO TABS: 1.0000 | ORAL_TABLET | ORAL | 0 refills | Status: DC | PRN

## 2022-03-05 MED ORDER — ROSUVASTATIN CALCIUM 10 MG PO TABS: 10.0000 mg | ORAL_TABLET | ORAL | 3 refills | Status: DC

## 2022-03-05 MED ORDER — EPINEPHRINE 0.3 MG/0.3ML IJ SOAJ: 0.3000 mg | INTRAMUSCULAR | 0 refills | Status: DC | PRN

## 2022-03-05 NOTE — Progress Notes (Signed)
MyChart Video visit  Subjective: CC: DDD lumbar spine PCP: Raliegh Ip, DO Sydney [name] is a 61 y.o. female. Patient provides verbal consent for consult held via video.  Due to COVID-19 pandemic this visit was conducted virtually. This visit type was conducted due to national recommendations for restrictions regarding the COVID-19 Pandemic (e.g. social distancing, sheltering in place) in an effort to limit this patient's exposure and mitigate transmission in our community. All issues noted in this document were discussed and addressed.  A physical exam was not performed with this format.   Location of patient: home Location of provider: [facility] Others present for call: none  1. DDD lumbar spine Back injection was successful on Monday. She reports that the nerve pain in the leg is helping.  She is alleviated by this and will have follow-up with them at some point.  She still has 10 tablets of the Norco to does not necessarily need a refill given her decreased use since the injection.  She will be traveling out of town soon to visit family  2.  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  Her EpiPen unfortunately is well out of date and she would like to get a renewal on this.  3.  Hyperlipidemia Patient is compliant with Crestor.  Needs new Rx.  No chest pain or shortness of breath reported.   ROS: Per HPI  Allergies  Allergen Reactions   Bee Pollen Anaphylaxis and Swelling   Bee Venom Anaphylaxis and Swelling   Butorphanol Other (See Comments)    Not sure told by md she was allergic after surgery.    Meloxicam Anxiety    Mood disorder Altered her personality    Penicillins Anaphylaxis and Hives    Unable to Recall As child mom was told she is highly allergic Did it involve swelling of the face/tongue/throat, SOB, or low BP? Unknown Did it involve sudden or severe rash/hives, skin peeling, or any reaction on the  inside of your mouth or nose? Unknown Did you need to seek medical attention at a hospital or doctor's office? Unknown When did it last happen?      childhood allergy If all above answers are "NO", may proceed with cephalosporin use.    Prochlorperazine Edisylate Anaphylaxis   Sulfa Antibiotics Anaphylaxis    Unable to Recall   Tramadol Anxiety    Didn't like the way it made her feel    Clindamycin Hives   Levaquin [Levofloxacin] Itching   Topiramate Nausea And Vomiting   Doxycycline Dermatitis, Hives, Itching, Photosensitivity and Rash   Past Medical History:  Diagnosis Date   Allergy    Anemia    history of   Anxiety    Arthritis    Asthma    Atherosclerosis    Depression    Family history of adverse reaction to anesthesia    pt's mother felt everything and couldn't speak during a gallbladder surgery   GERD (gastroesophageal reflux disease)    History of hiatal hernia    History of kidney stones    Hypertension    Pneumonia    a few times   TIA (transient ischemic attack) 2012    Current Outpatient Medications:    albuterol (VENTOLIN HFA) 108 (90 Base) MCG/ACT inhaler, Inhale 2 puffs into the lungs every 6 (six) hours as needed for wheezing or shortness of breath., Disp: , Rfl:    amLODipine (NORVASC) 5 MG tablet, Take 1 tablet (5  mg total) by mouth daily., Disp: 90 tablet, Rfl: 1   augmented betamethasone dipropionate (DIPROLENE-AF) 0.05 % cream, Apply topically., Disp: , Rfl:    cetirizine (ZYRTEC) 10 MG tablet, Take 10 mg by mouth daily., Disp: , Rfl:    cyclobenzaprine (FLEXERIL) 10 MG tablet, TAKE 1/2 TO 1 TABLET BY MOUTH 3 TIMES A DAY AS NEEDED FOR MUSCLE SPASMS, Disp: 90 tablet, Rfl: 1   furosemide (LASIX) 20 MG tablet, Take 1 tablet (20 mg total) by mouth daily as needed for edema., Disp: 90 tablet, Rfl: 3   HYDROcodone-acetaminophen (NORCO/VICODIN) 5-325 MG tablet, Take 1 tablet by mouth every 4 (four) hours as needed for severe pain (put on file)., Disp: 30  tablet, Rfl: 0   hydrOXYzine (VISTARIL) 25 MG capsule, Take 1-2 capsules (25-50 mg total) by mouth every 8 (eight) hours as needed for anxiety., Disp: 60 capsule, Rfl: 1   LORazepam (ATIVAN) 1 MG tablet, Take 1 tablet 30 minute before leaving house on day of office surgery.  Bring second tablet with you to office., Disp: 2 tablet, Rfl: 0   metoprolol succinate (TOPROL-XL) 25 MG 24 hr tablet, Take 1 tablet (25 mg total) by mouth daily., Disp: 90 tablet, Rfl: 1   predniSONE (STERAPRED UNI-PAK 21 TAB) 10 MG (21) TBPK tablet, Take 6,5,4,3,2,1 one tablet less each day, Disp: 21 tablet, Rfl: 0   rosuvastatin (CRESTOR) 10 MG tablet, Take 10 mg by mouth every other day., Disp: , Rfl:    sertraline (ZOLOFT) 100 MG tablet, Take 1.5 tablets (150 mg total) by mouth daily., Disp: 90 tablet, Rfl: 3   traZODone (DESYREL) 100 MG tablet, TAKE 1 TO 2 TABLETS AT     BEDTIME AS NEEDED FOR SLEEP, Disp: 180 tablet, Rfl: 1  Gen: Well-appearing female no acute distress  Assessment/ Plan: 61 y.o. female   DDD (degenerative disc disease), lumbosacral - Plan: HYDROcodone-acetaminophen (NORCO/VICODIN) 5-325 MG tablet  Chronic left-sided low back pain with left-sided sciatica - Plan: HYDROcodone-acetaminophen (NORCO/VICODIN) 5-325 MG tablet  Left hip pain - Plan: HYDROcodone-acetaminophen (NORCO/VICODIN) 5-325 MG tablet  Pure hypercholesterolemia - Plan: rosuvastatin (CRESTOR) 10 MG tablet  History of anaphylaxis - Plan: EPINEPHrine (EPIPEN 2-PAK) 0.3 mg/0.3 mL IJ SOAJ injection  DDD symptoms are improving with the injection.  This is very promising.  I did go ahead and renew her Norco to have on hand if needed.  The national narcotic database reviewed and there were no red flags.  She is up-to-date on UDS and CSC  Crestor renewed.  Not yet due for fasting lipid.  EpiPen is expired so I have sent in a new pack to her pharmacy  Keep follow-up for annual physical  Start time: 10:38am End time: 10:48a  Total time  spent on patient care (including video visit/ documentation): 10 minutes  [name], DO [facility] [phone]

## 2022-03-27 ENCOUNTER — Encounter: Payer: Self-pay | Admitting: Physical Medicine and Rehabilitation

## 2022-04-03 ENCOUNTER — Other Ambulatory Visit: Payer: No Typology Code available for payment source

## 2022-04-03 DIAGNOSIS — I10 Essential (primary) hypertension: Secondary | ICD-10-CM

## 2022-04-03 DIAGNOSIS — E7800 Pure hypercholesterolemia, unspecified: Secondary | ICD-10-CM

## 2022-04-04 ENCOUNTER — Ambulatory Visit (INDEPENDENT_AMBULATORY_CARE_PROVIDER_SITE_OTHER): Payer: No Typology Code available for payment source | Admitting: Family Medicine

## 2022-04-04 ENCOUNTER — Encounter: Payer: Self-pay | Admitting: Family Medicine

## 2022-04-04 VITALS — BP 141/76 | HR 73 | Temp 97.8°F | Ht 65.0 in | Wt 230.2 lb

## 2022-04-04 DIAGNOSIS — E7800 Pure hypercholesterolemia, unspecified: Secondary | ICD-10-CM

## 2022-04-04 DIAGNOSIS — I10 Essential (primary) hypertension: Secondary | ICD-10-CM | POA: Diagnosis not present

## 2022-04-04 DIAGNOSIS — Z0000 Encounter for general adult medical examination without abnormal findings: Secondary | ICD-10-CM

## 2022-04-04 DIAGNOSIS — Z23 Encounter for immunization: Secondary | ICD-10-CM | POA: Diagnosis not present

## 2022-04-04 DIAGNOSIS — Z0001 Encounter for general adult medical examination with abnormal findings: Secondary | ICD-10-CM

## 2022-04-04 DIAGNOSIS — F32A Depression, unspecified: Secondary | ICD-10-CM

## 2022-04-04 DIAGNOSIS — F5104 Psychophysiologic insomnia: Secondary | ICD-10-CM

## 2022-04-04 DIAGNOSIS — M25552 Pain in left hip: Secondary | ICD-10-CM

## 2022-04-04 LAB — CMP14+EGFR
ALT: 12 IU/L (ref 0–32)
AST: 9 IU/L (ref 0–40)
Albumin/Globulin Ratio: 2 (ref 1.2–2.2)
Albumin: 4.2 g/dL (ref 3.9–4.9)
Alkaline Phosphatase: 99 IU/L (ref 44–121)
BUN/Creatinine Ratio: 18 (ref 12–28)
BUN: 11 mg/dL (ref 8–27)
Bilirubin Total: 0.5 mg/dL (ref 0.0–1.2)
CO2: 25 mmol/L (ref 20–29)
Calcium: 9 mg/dL (ref 8.7–10.3)
Chloride: 104 mmol/L (ref 96–106)
Creatinine, Ser: 0.62 mg/dL (ref 0.57–1.00)
Globulin, Total: 2.1 g/dL (ref 1.5–4.5)
Glucose: 99 mg/dL (ref 70–99)
Potassium: 4.6 mmol/L (ref 3.5–5.2)
Sodium: 142 mmol/L (ref 134–144)
Total Protein: 6.3 g/dL (ref 6.0–8.5)
eGFR: 101 mL/min/{1.73_m2} (ref >59)

## 2022-04-04 LAB — CBC WITH DIFFERENTIAL/PLATELET
Basophils Absolute: 0 10*3/uL (ref 0.0–0.2)
Basos: 1 %
EOS (ABSOLUTE): 0.1 10*3/uL (ref 0.0–0.4)
Eos: 2 %
Hematocrit: 44.7 % (ref 34.0–46.6)
Hemoglobin: 14.9 g/dL (ref 11.1–15.9)
Immature Grans (Abs): 0 10*3/uL (ref 0.0–0.1)
Immature Granulocytes: 0 %
Lymphocytes Absolute: 0.8 10*3/uL (ref 0.7–3.1)
Lymphs: 15 %
MCH: 30.6 pg (ref 26.6–33.0)
MCHC: 33.3 g/dL (ref 31.5–35.7)
MCV: 92 fL (ref 79–97)
Monocytes Absolute: 0.3 10*3/uL (ref 0.1–0.9)
Monocytes: 7 %
Neutrophils Absolute: 3.8 10*3/uL (ref 1.4–7.0)
Neutrophils: 75 %
Platelets: 242 10*3/uL (ref 150–450)
RBC: 4.87 x10E6/uL (ref 3.77–5.28)
RDW: 12.7 % (ref 11.7–15.4)
WBC: 5.1 10*3/uL (ref 3.4–10.8)

## 2022-04-04 LAB — LIPID PANEL
Chol/HDL Ratio: 2.9 ratio (ref 0.0–4.4)
Cholesterol, Total: 148 mg/dL (ref 100–199)
HDL: 51 mg/dL (ref >39)
LDL Chol Calc (NIH): 84 mg/dL (ref 0–99)
Triglycerides: 67 mg/dL (ref 0–149)
VLDL Cholesterol Cal: 13 mg/dL (ref 5–40)

## 2022-04-04 MED ORDER — ROSUVASTATIN CALCIUM 10 MG PO TABS: 10.0000 mg | ORAL_TABLET | ORAL | 3 refills | Status: DC

## 2022-04-04 MED ORDER — SERTRALINE HCL 100 MG PO TABS: 150.0000 mg | ORAL_TABLET | Freq: Every day | ORAL | 3 refills | Status: DC

## 2022-04-04 MED ORDER — METOPROLOL SUCCINATE ER 25 MG PO TB24: 25.0000 mg | ORAL_TABLET | Freq: Every day | ORAL | 3 refills | Status: DC

## 2022-04-04 MED ORDER — TRAZODONE HCL 100 MG PO TABS: ORAL_TABLET | ORAL | 3 refills | Status: DC

## 2022-04-04 MED ORDER — AMLODIPINE BESYLATE 5 MG PO TABS: 5.0000 mg | ORAL_TABLET | Freq: Every day | ORAL | 3 refills | Status: DC

## 2022-04-04 NOTE — Progress Notes (Signed)
Sydney [name] is a 61 y.o. female presents to office today for annual physical exam examination.    Concerns today include: 1.  Left hip pain Patient has been experiencing some left hip pain that radiates down her left lower extremity.  She has known degenerative changes in her lumbar spine and this did respond to the shot initially but that only lasted a week.  She was instructed by her spinal specialist to follow-up with her orthopedist but she has not yet called them.  She typically is using a Norco at nighttime as needed severe pain.  She continues to use her muscle relaxer as prescribed.  She feels like her left hip snaps and pops.  Has history of hip surgery.  Utilizing cane.  Has not had any falls but has had some near falls  Marital status: Married  Diet: Fair, typical American, Exercise: No structured Last eye exam: Up-to-date Last dental exam: needs Last colonoscopy: UTD Last mammogram: UTD Last pap smear: UTD Refills needed today: none Immunizations needed: Shingles, Flu, COVID Immunization History  Administered Date(s) Administered   Influenza,inj,Quad PF,6+ Mos 04/17/2010, 04/03/2011, 04/24/2013, 05/08/2014, 04/18/2015, 04/28/2016, 05/14/2017, 07/01/2018, 04/15/2019, 04/10/2020, 05/08/2021   Influenza,inj,quad, With Preservative 04/25/2016   Influenza-Unspecified 05/10/2012, 06/08/2013   PFIZER(Purple Top)SARS-COV-2 Vaccination 10/13/2019, 11/05/2019   Pneumococcal Polysaccharide-23 10/30/2010   Pneumococcal-Unspecified 05/09/2016   Tdap 02/02/2017   Tetanus 07/22/2007     Past Medical History:  Diagnosis Date   Allergy    Anemia    history of   Anxiety    Arthritis    Asthma    Atherosclerosis    Depression    Family history of adverse reaction to anesthesia    pt's mother felt everything and couldn't speak during a gallbladder surgery   GERD (gastroesophageal reflux disease)    History of hiatal hernia    History of kidney stones    Hypertension     Pneumonia    a few times   TIA (transient ischemic attack) 2012   Social History   Socioeconomic History   Marital status: Married    Spouse name: Not on file   Number of children: 3   Years of education: Not on file   Highest education level: Not on file  Occupational History   Not on file  Tobacco Use   Smoking status: Every Day    Packs/day: 0.50    Years: 23.00    Total pack years: 11.50    Types: Cigarettes   Smokeless tobacco: Never  Vaping Use   Vaping Use: Never used  Substance and Sexual Activity   Alcohol use: Yes    Comment: occ   Drug use: Never   Sexual activity: Not Currently  Other Topics Concern   Not on file  Social History Narrative   Recently relocated to [place] from [place].  She resides at home with her husband.   She has 3 children but 1 passed away from cancer.   Social Determinants of Health   Financial Resource Strain: Not on file  Food Insecurity: Not on file  Transportation Needs: Not on file  Physical Activity: Not on file  Stress: Not on file  Social Connections: Not on file  Intimate Partner Violence: Not on file   Past Surgical History:  Procedure Laterality Date   ABDOMINAL HYSTERECTOMY     abdominal   APPENDECTOMY     bladder tack     CARPAL TUNNEL RELEASE Bilateral    CERVICAL SPINE SURGERY  5-6, 6-7   CHOLECYSTECTOMY     COLONOSCOPY WITH PROPOFOL N/A 07/10/2020   Procedure: COLONOSCOPY WITH PROPOFOL;  Surgeon: Lanelle Bal, DO;  Location: [facility];  Service: Endoscopy;  Laterality: N/A;  12:15pm   ENDOVENOUS ABLATION SAPHENOUS VEIN W/ LASER Left 11/28/2021   endovenous laser ablation left greater saphenous vein by Coral Else MD   HAMMER TOE SURGERY Right    JOINT REPLACEMENT     TOTAL HIP 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   Procedure: LEFT TOTAL HIP ARTHROPLASTY -DIRECT ANTERIOR;  Surgeon: Eldred Manges, MD;  Location: [facility] OR;  Service: Orthopedics;  Laterality: Left;   TOTAL HIP ARTHROPLASTY Right  12/05/2019   Procedure: RIGHT TOTAL HIP ARTHROPLASTY ANTERIOR APPROACH  DIRECT ANTERIOR;  Surgeon: Eldred Manges, MD;  Location: [facility] OR;  Service: Orthopedics;  Laterality: Right;   Family History  Problem Relation Age of Onset   Anxiety disorder Mother    Depression Mother    Heart disease Mother    Hypertension Mother    Arrhythmia Mother    Cancer Father        colon, age greater than 2   Lung cancer Father    Migraines Sister    Cancer Daughter        nerve sheath sarcoma   Alcohol abuse Brother    Breast cancer Neg Hx     Current Outpatient Medications:    albuterol (VENTOLIN HFA) 108 (90 Base) MCG/ACT inhaler, Inhale 2 puffs into the lungs every 6 (six) hours as needed for wheezing or shortness of breath., Disp: , Rfl:    amLODipine (NORVASC) 5 MG tablet, Take 1 tablet (5 mg total) by mouth daily., Disp: 90 tablet, Rfl: 1   augmented betamethasone dipropionate (DIPROLENE-AF) 0.05 % cream, Apply topically., Disp: , Rfl:    cetirizine (ZYRTEC) 10 MG tablet, Take 10 mg by mouth daily., Disp: , Rfl:    cyclobenzaprine (FLEXERIL) 10 MG tablet, TAKE 1/2 TO 1 TABLET BY MOUTH 3 TIMES A DAY AS NEEDED FOR MUSCLE SPASMS, Disp: 90 tablet, Rfl: 1   EPINEPHrine (EPIPEN 2-PAK) 0.3 mg/0.3 mL IJ SOAJ injection, Inject 0.3 mg into the muscle as needed for anaphylaxis., Disp: 2 each, Rfl: 0   HYDROcodone-acetaminophen (NORCO/VICODIN) 5-325 MG tablet, Take 1 tablet by mouth every 4 (four) hours as needed for severe pain (put on file)., Disp: 30 tablet, Rfl: 0   metoprolol succinate (TOPROL-XL) 25 MG 24 hr tablet, Take 1 tablet (25 mg total) by mouth daily., Disp: 90 tablet, Rfl: 1   rosuvastatin (CRESTOR) 10 MG tablet, Take 1 tablet (10 mg total) by mouth every other day., Disp: 45 tablet, Rfl: 3   sertraline (ZOLOFT) 100 MG tablet, Take 1.5 tablets (150 mg total) by mouth daily., Disp: 90 tablet, Rfl: 3   traZODone (DESYREL) 100 MG tablet, TAKE 1 TO 2 TABLETS AT     BEDTIME AS NEEDED FOR SLEEP,  Disp: 180 tablet, Rfl: 1  Allergies  Allergen Reactions   Bee Pollen Anaphylaxis and Swelling   Bee Venom Anaphylaxis and Swelling   Butorphanol Other (See Comments)    Not sure told by md she was allergic after surgery.    Meloxicam Anxiety    Mood disorder Altered her personality    Penicillins Anaphylaxis and Hives    Unable to Recall As child mom was told she is highly allergic Did it involve swelling of the face/tongue/throat, SOB, or low BP? Unknown Did it involve sudden or severe rash/hives, skin peeling, or  any reaction on the inside of your mouth or nose? Unknown Did you need to seek medical attention at a hospital or doctor's office? Unknown When did it last happen?      childhood allergy If all above answers are "NO", may proceed with cephalosporin use.    Prochlorperazine Edisylate Anaphylaxis   Sulfa Antibiotics Anaphylaxis    Unable to Recall   Tramadol Anxiety    Didn't like the way it made her feel    Clindamycin Hives   Levaquin [Levofloxacin] Itching   Topiramate Nausea And Vomiting   Doxycycline Dermatitis, Hives, Itching, Photosensitivity and Rash     ROS: Review of Systems Pertinent items noted in HPI and remainder of comprehensive ROS otherwise negative.    Physical exam BP (!) 141/76   Pulse 73   Temp 97.8 F (36.6 C)   Ht 5\' 5"  (1.651 m)   Wt 230 lb 3.2 oz (104.4 kg)   SpO2 95%   BMI 38.31 kg/m  General appearance: alert, cooperative, appears stated age, no distress, and morbidly obese Head: Normocephalic, without obvious abnormality, atraumatic Eyes: negative findings: lids and lashes normal, conjunctivae and sclerae normal, corneas clear, and pupils equal, round, reactive to light and accomodation Ears: normal TM's and external ear canals both ears Nose: Nares normal. Septum midline. Mucosa normal. No drainage or sinus tenderness. Throat: lips, mucosa, and tongue normal; teeth and gums normal Neck: no adenopathy, supple, symmetrical,  trachea midline, and thyroid not enlarged, symmetric, no tenderness/mass/nodules Back:  Increased kyphosis with scoliotic curve present. Lungs: clear to auscultation bilaterally Heart: regular rate and rhythm, S1, S2 normal, no murmur, click, rub or gallop Abdomen: soft, non-tender; bowel sounds normal; no masses,  no organomegaly Extremities: extremities normal, atraumatic, no cyanosis or edema Pulses: 2+ and symmetric Skin: Skin color, texture, turgor normal. No rashes or lesions Lymph nodes: Cervical, supraclavicular, and axillary nodes normal. Neurologic: Grossly normal MSK: Antalgic gait and station.  Utilizing cane for ambulation     04/04/2022    1:25 PM 12/31/2021    2:12 PM 11/19/2021   11:39 AM  Depression screen PHQ 2/9  Decreased Interest 1 1 1   Down, Depressed, Hopeless 1 1 1   PHQ - 2 Score 2 2 2   Altered sleeping 1 2 1   Tired, decreased energy 2 1 1   Change in appetite 3 2 1   Feeling bad or failure about yourself  1 1 1   Trouble concentrating 1 1 0  Moving slowly or fidgety/restless 0 0 0  Suicidal thoughts 0 0 0  PHQ-9 Score 10 9 6   Difficult doing work/chores Somewhat difficult Very difficult Somewhat difficult      04/04/2022    1:25 PM 12/31/2021    2:13 PM 11/19/2021   11:40 AM 08/29/2021    3:56 PM  GAD 7 : Generalized Anxiety Score  Nervous, Anxious, on Edge 2 1 3 1   Control/stop worrying 2 1 2 1   Worry too much - different things 2 1 2 1   Trouble relaxing 2 2 2 1   Restless 2 2 1 1   Easily annoyed or irritable 2 2 1 1   Afraid - awful might happen 1 2 3 1   Total GAD 7 Score 13 11 14 7   Anxiety Difficulty Somewhat difficult  Somewhat difficult Somewhat difficult    Assessment/ Plan: Adesuwa Osgood here for annual physical exam.   Annual physical exam  Pure hypercholesterolemia - Plan: rosuvastatin (CRESTOR) 10 MG tablet  Essential hypertension - Plan: amLODipine (NORVASC) 5 MG  tablet, metoprolol succinate (TOPROL-XL) 25 MG 24 hr tablet  Need for  immunization against influenza - Plan: Flu Vaccine QUAD 65mo+IM (Fluarix, Fluzone & Alfiuria Quad PF)  Depressive disorder - Plan: sertraline (ZOLOFT) 100 MG tablet  Psychophysiological insomnia - Plan: traZODone (DESYREL) 100 MG tablet  Left hip pain  Reviewed fasting labs.  Flu shot administered.  Prescriptions have been renewed.  Blood pressure, cholesterol stable.  Depression not well controlled but unsure if this is situational due to chronic pain and stress for caring for her grandchild/home schooling.  May need to consider referral to psychiatry for further assistance should patient desire  With regards to her hip pain, I wonder if perhaps this might be a snapping hip due to the history of trauma, surgical need.  We will CC to Dr. Ophelia Charter for further assistance.  She has pain meds if needed.  Continue to use sparingly and follow-up as needed refills  Counseled on healthy lifestyle choices, including diet (rich in fruits, vegetables and lean meats and low in salt and simple carbohydrates) and exercise (at least 30 minutes of moderate physical activity daily).    Dwight Adamczak M. Nadine Counts, DO

## 2022-04-04 NOTE — Patient Instructions (Signed)
Snapping Hip Syndrome  Snapping hip syndrome is a condition that causes a feeling like a snap or a pop in your hip, especially when you walk, stand up from a chair, or swing your leg. Snapping hip syndrome can affect different areas of your hip, including the front, side, or back. Strong bands of tissue (tendons) attach the muscles in your buttocks, thighs, and pelvis to the bones of your hip. Snapping hip syndrome typically happens when a muscle or tendon moves across a bony part of your hip. Snapping hip can also involve torn or loose structures within the joint. What are the causes? This condition is usually caused by tight tendons or muscles around the hip. This often happens from overuse. What increases the risk? The following factors may make you more likely to develop this condition: Being 63-92 years old. Being a Horticulturist, commercial, runner, weight lifter, gymnast, or soccer player. Having had an injury to your hip or pelvis. Having an abnormally shaped pelvis or leg (deformity). What are the signs or symptoms? Symptoms of this condition include: A sensation like a snap or a pop in the front, side, or back of the hip when moving your leg. This can cause pain. The pain typically goes away when you stop moving. Tightness in the hip. Swelling in the front or side of the hip. Leg weakness, especially when trying to lift your leg upward or sideways. Difficulty getting out of low chairs. How is this diagnosed? This condition is diagnosed based on your symptoms, medical history, and a physical exam. Your health care provider may have you move your leg into certain positions and then test your muscle strength. You may have tests to rule out other causes of pain. These may include an X-ray, an ultrasound, or an MRI. You may also have an injection of a numbing medicine (lidocaine) to see if that reduces your symptoms. How is this treated? Treatment for this condition includes: Stopping all activities that  cause pain or make your condition worse. Icing your hip to relieve pain. Taking NSAIDs to reduce pain or having injections of medicine to reduce swelling (corticosteroids). Doing range-of-motion and strengthening exercises (physical therapy) as told by your health care provider. You may need surgery to loosen your muscle and tendon or to remove any loose pieces of cartilage, if this applies. Follow these instructions at home: Managing pain, stiffness, and swelling  If directed, put ice on the injured area. Put ice in a plastic bag. Place a towel between your skin and the bag. Leave the ice on for 20 minutes, 2-3 times a day. General instructions Take over-the-counter and prescription medicines only as told by your health care provider. Return to your normal activities as told by your health care provider. Ask your health care provider what activities are safe for you. Do physical therapy as told by your health care provider. Keep all follow-up visits as told by your health care provider. This is important. How is this prevented? Warm up and stretch before being active. Cool down and stretch after being active. Give your body time to rest between periods of activity. Maintain physical fitness, including: Strength. Flexibility. Contact a health care provider if: You have pain, swelling, and difficulty moving that gets worse. Your leg or hip seems weak and feels like it cannot hold you up. You cannot stand or walk without severe pain. Summary Snapping hip syndrome is a condition that causes a feeling like a snap or a pop in your hip, especially when  you walk, stand up from a chair, or swing your leg. This condition is usually caused by tight tendons or muscles around the hip. It is treated by stopping activities that make it worse, icing, taking medicines, doing physical therapy, and having surgery if needed. This information is not intended to replace advice given to you by your health  care provider. Make sure you discuss any questions you have with your health care provider. Document Revised: 10/24/2021 Document Reviewed: 11/22/2020 Elsevier Patient Education  2023 [employer].

## 2022-04-10 ENCOUNTER — Ambulatory Visit: Payer: No Typology Code available for payment source | Admitting: Orthopaedic Surgery

## 2022-04-10 ENCOUNTER — Encounter: Payer: Self-pay | Admitting: Orthopaedic Surgery

## 2022-04-10 VITALS — Ht 65.0 in | Wt 230.0 lb

## 2022-04-10 DIAGNOSIS — M7062 Trochanteric bursitis, left hip: Secondary | ICD-10-CM | POA: Diagnosis not present

## 2022-04-10 DIAGNOSIS — Z96643 Presence of artificial hip joint, bilateral: Secondary | ICD-10-CM | POA: Diagnosis not present

## 2022-04-10 MED ORDER — METHYLPREDNISOLONE ACETATE 40 MG/ML IJ SUSP
40.0000 mg | INTRAMUSCULAR | Status: AC | PRN
  Administered 2022-04-10: 40 mg via INTRA_ARTICULAR

## 2022-04-10 MED ORDER — LIDOCAINE HCL 1 % IJ SOLN
1.0000 mL | INTRAMUSCULAR | Status: AC | PRN
  Administered 2022-04-10: 1 mL

## 2022-04-10 MED ORDER — BUPIVACAINE HCL 0.25 % IJ SOLN
2.0000 mL | INTRAMUSCULAR | Status: AC | PRN
  Administered 2022-04-10: 2 mL via INTRA_ARTICULAR

## 2022-04-10 NOTE — Progress Notes (Signed)
Office Visit Note   Patient: Sydney [name]           Date of Birth: 1961-03-25           MRN: [id number] Visit Date: 04/10/2022              Requested by: Janora Norlander, DO [place],  [place] [address] PCP: Janora Norlander, DO   Assessment & Plan: Visit Diagnoses:  1. Trochanteric bursitis, left hip   2. History of total replacement of both hip joints     Plan: Post injection she still had some pain in her buttocks but had significant improvement in the pain laterally over the trochanter and was nondistended.  Patient states she did be careful what close she wears because lateral aspect of her hip and extending down to the knee laterally was so sensitive she could not let anything touch it.  Follow-Up Instructions: No follow-ups on file.   Orders:  Orders Placed This Encounter  Procedures   Large Joint Inj   No orders of the defined types were placed in this encounter.     Procedures: Large Joint Inj: L greater trochanter on 04/10/2022 2:24 PM Details: 22 G needle, lateral approach Medications: 1 mL lidocaine 1 %; 2 mL bupivacaine 0.25 %; 40 mg methylPREDNISolone acetate 40 MG/ML      Clinical Data: No additional findings.   Subjective: Chief Complaint  Patient presents with   Left Leg - Pain   Lower Back - Pain    HPI 61 year old female returns she states for months she has had pain and had 1 episode where she felt like her left hip snapped and she had to grab the doorway to keep from falling.  She has been ambulating with a cane.  MRI scan showed some severe foraminal narrowing she had an epidural which she states helped her pain not completely but did improve the pain but just lasted a few days. Her injection was a left L3-4 neuroforaminal injection.  Patient continues to have to use a cane for ambulation.  She has had bilateral total arthroplasties done in 2021. Review of Systems no fever chills all other systems noncontributory to  HPI.   Objective: Vital Signs: Ht 5\' 5"  (1.651 m)   Wt 230 lb (104.3 kg)   BMI 38.27 kg/m   Physical Exam Constitutional:      Appearance: She is well-developed.  HENT:     Head: Normocephalic.     Right Ear: External ear normal.     Left Ear: External ear normal. There is no impacted cerumen.  Eyes:     Pupils: Pupils are equal, round, and reactive to light.  Neck:     Thyroid: No thyromegaly.     Trachea: No tracheal deviation.  Cardiovascular:     Rate and Rhythm: Normal rate.  Pulmonary:     Effort: Pulmonary effort is normal.  Abdominal:     Palpations: Abdomen is soft.  Musculoskeletal:     Cervical back: No rigidity.  Skin:    General: Skin is warm and dry.  Neurological:     Mental Status: She is alert and oriented to person, place, and time.  Psychiatric:        Behavior: Behavior normal.     Ortho Exam negative logroll hips.  Exquisite trochanteric bursal tenderness on the left which reproduces her pain minimal on the right.  Knee range of motion is good.  Incision is well-healed.  Specialty  Comments:  EXAM: MRI LUMBAR SPINE WITHOUT CONTRAST   TECHNIQUE: Multiplanar, multisequence MR imaging of the lumbar spine was performed. No intravenous contrast was administered.   COMPARISON:  04/27/2020   FINDINGS: Segmentation:  5 lumbar-type vertebral bodies.   Alignment: Scoliosis, with dextroscoliosis of the lumbar spine. Trace retrolisthesis of T12 on L1 and L1 on L2 and trace anterolisthesis of L5 on S1, unchanged.   Vertebrae: No acute fracture or suspicious osseous lesion. Endplate degenerative changes. 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   Conus medullaris and cauda equina: Conus extends to the L1 level. Conus and cauda equina appear normal.   Paraspinal and other soft tissues: Asymmetric fatty atrophy of the superior left psoas and inferior right-greater-than-left  paraspinous muscles.   Disc levels:   T12-L1: Mild disc bulge. Mild facet arthropathy. No spinal canal stenosis or neural foraminal narrowing.   L1-L2: Mild left eccentric disc bulge. Left-greater-than-right facet arthropathy. Narrowing of the left lateral recess. No spinal canal stenosis. Mild left neural foraminal narrowing, which has progressed from the prior exam.   L2-L3: Mild disc bulge with left paracentral and subarticular protrusion which narrows the left lateral recess. Severe left and moderate right facet arthropathy. No spinal canal stenosis. Mild-to-moderate left neural foraminal narrowing which has progressed from the prior exam   L3-L4: Mild disc bulge with left foraminal and extreme lateral disc protrusion. Moderate facet arthropathy. Narrowing of the lateral recesses. No spinal canal stenosis. Moderate left neural foraminal narrowing, which has progressed from the prior exam.   L4-L5: Mild disc bulge with right foraminal and extreme lateral protrusion. Severe right and mild left facet arthropathy. Narrowing of the right lateral recess. No spinal canal stenosis. Mild right neural foraminal narrowing, unchanged.   L5-S1: Mild disc bulge. Moderate facet arthropathy. No spinal canal stenosis. Mild right neural foraminal narrowing, which has progressed from the prior exam.   IMPRESSION: 1. Progression of previously noted asymmetric degenerative changes with progressive left neural foraminal narrowing in the upper lumbar spine, which is now moderate at L3-L4, mild-to-moderate at L2-L3, and mild at L1-L2. Narrowing of the left lateral recesses at L1-L2 and L2-L3 and bilateral lateral recesses L3-L4 could affect the descending left L2 and L3 nerve roots and bilateral L4 nerve roots, respectively. 2. Mild right neural foraminal narrowing at L4-L5 and L5-S1. Narrowing of the right lateral recess at L4-L5 could affect the descending right L5 nerve roots.      Electronically Signed   By: Wiliam Ke M.D.   On: 02/05/2022 13:58  Imaging: No results found.   PMFS History: Patient Active Problem List   Diagnosis Date Noted   Facet degeneration of lumbar region 02/13/2022   Peripheral vascular disease with stasis dermatitis 09/03/2021   Urine frequency 05/10/2021   Cellulitis of left leg 01/18/2021   Rash 01/18/2021   History of bilateral hip arthroplasty 01/03/2021   Upper respiratory tract infection 12/30/2020   Non-recurrent acute serous otitis media of right ear 12/30/2020   Pharyngitis 10/29/2020   Trochanteric bursitis, left hip 09/13/2020   Closed fracture of multiple pubic rami, left, sequela 08/16/2020   H/O adenomatous polyp of colon 05/23/2020   FH: colon cancer in relative diagnosed at >21 years old 05/23/2020   Spinal stenosis of lumbar region 05/03/2020   Impingement syndrome of right shoulder 05/03/2020   H/O total hip arthroplasty 12/06/2019   Status post total replacement of left hip 09/15/2019   Back  pain 07/01/2018   DDD (degenerative disc disease), lumbosacral 07/01/2018   Venous insufficiency of both lower extremities 11/17/2016   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 level with radiculopathy 08/18/2016   Morbid obesity with BMI of 40.0-44.9, adult (HCC) 07/28/2016   Chronic pain syndrome 10/24/2015   Dysfunction of both eustachian tubes 07/26/2014   Calculus of left kidney 04/25/2014   Impaired fasting glucose 04/05/2014   History of bladder surgery 04/03/2014   History of hysterectomy 04/03/2014   Urge incontinence 04/03/2014   Asthma 07/28/2013   Nontoxic uninodular goiter 07/28/2013   Solitary pulmonary nodule 07/28/2013   Lumbar radiculopathy 04/20/2013   Insomnia 03/22/2013   Essential hypertension 08/18/2011   Tobacco use disorder 08/18/2011   Moderate episode of recurrent major depressive disorder (HCC) 04/03/2011   Other B-complex deficiencies 08/16/2010   Family history of cerebrovascular accident  (CVA) 04/17/2010   Family history of malignant neoplasm of gastrointestinal tract 04/17/2010   Family history of other cardiovascular diseases(V17.49) 04/17/2010   Past Medical History:  Diagnosis Date   Allergy    Anemia    history of   Anxiety    Arthritis    Asthma    Atherosclerosis    Depression    Family history of adverse reaction to anesthesia    pt's mother felt everything and couldn't speak during a gallbladder surgery   GERD (gastroesophageal reflux disease)    History of hiatal hernia    History of kidney stones    Hypertension    Pneumonia    a few times   TIA (transient ischemic attack) 2012    Family History  Problem Relation Age of Onset   Anxiety disorder Mother    Depression Mother    Heart disease Mother    Hypertension Mother    Arrhythmia Mother    Cancer Father        colon, age greater than 6960   Lung cancer Father    Migraines Sister    Cancer Daughter        nerve sheath sarcoma   Alcohol abuse Brother    Breast cancer Neg Hx     Past Surgical History:  Procedure Laterality Date   ABDOMINAL HYSTERECTOMY     abdominal   APPENDECTOMY     bladder tack     CARPAL TUNNEL RELEASE Bilateral    CERVICAL SPINE SURGERY     5-6, 6-7   CHOLECYSTECTOMY     COLONOSCOPY WITH PROPOFOL N/A 07/10/2020   Procedure: COLONOSCOPY WITH PROPOFOL;  Surgeon: Lanelle Balarver, Charles K, DO;  [facility];  Service: Endoscopy;  Laterality: N/A;  12:15pm   ENDOVENOUS ABLATION SAPHENOUS VEIN W/ LASER Left 11/28/2021   endovenous laser ablation left greater saphenous vein by Coral ElseVance Brabham MD   HAMMER TOE SURGERY Right    JOINT REPLACEMENT     TOTAL HIP 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   Procedure: LEFT TOTAL HIP ARTHROPLASTY -DIRECT ANTERIOR;  Surgeon: Eldred MangesYates, Alonda Weaber C, MD;  Location: [facility];  Service: Orthopedics;  Laterality: Left;   TOTAL HIP ARTHROPLASTY Right 12/05/2019   Procedure: RIGHT TOTAL HIP ARTHROPLASTY ANTERIOR APPROACH  DIRECT ANTERIOR;  Surgeon: Eldred MangesYates,  Illene Sweeting C, MD;  Location: [facility];  Service: Orthopedics;  Laterality: Right;   Social History   Occupational History   Not on file  Tobacco Use   Smoking status: Every Day    Packs/day: 0.50    Years: 23.00    Total pack years: 11.50    Types: Cigarettes  Smokeless tobacco: Never  Vaping Use   Vaping Use: Never used  Substance and Sexual Activity   Alcohol use: Yes    Comment: occ   Drug use: Never   Sexual activity: Not Currently

## 2022-05-15 ENCOUNTER — Encounter: Payer: Self-pay | Admitting: Orthopaedic Surgery

## 2022-05-15 ENCOUNTER — Ambulatory Visit (INDEPENDENT_AMBULATORY_CARE_PROVIDER_SITE_OTHER): Payer: No Typology Code available for payment source | Admitting: Orthopaedic Surgery

## 2022-05-15 VITALS — Ht 65.0 in | Wt 230.0 lb

## 2022-05-15 DIAGNOSIS — Z96643 Presence of artificial hip joint, bilateral: Secondary | ICD-10-CM

## 2022-05-15 DIAGNOSIS — M7062 Trochanteric bursitis, left hip: Secondary | ICD-10-CM | POA: Diagnosis not present

## 2022-05-15 DIAGNOSIS — M47816 Spondylosis without myelopathy or radiculopathy, lumbar region: Secondary | ICD-10-CM

## 2022-05-15 MED ORDER — LIDOCAINE HCL 1 % IJ SOLN
1.0000 mL | INTRAMUSCULAR | Status: AC | PRN
  Administered 2022-05-15: 1 mL

## 2022-05-15 MED ORDER — BUPIVACAINE HCL 0.25 % IJ SOLN
2.0000 mL | INTRAMUSCULAR | Status: AC | PRN
  Administered 2022-05-15: 2 mL via INTRA_ARTICULAR

## 2022-05-15 MED ORDER — METHYLPREDNISOLONE ACETATE 40 MG/ML IJ SUSP
40.0000 mg | INTRAMUSCULAR | Status: AC | PRN
  Administered 2022-05-15: 40 mg via INTRA_ARTICULAR

## 2022-05-15 NOTE — Progress Notes (Signed)
Office Visit Note   Patient: Sydney [name]           Date of Birth: Oct 09, 1960           MRN: [id number] Visit Date: 05/15/2022              Requested by: Raliegh Ip, DO [address] [place],  [place] [address] PCP: Raliegh Ip, DO   Assessment & Plan: Visit Diagnoses:  1. Trochanteric bursitis, left hip   2. Facet degeneration of lumbar region   3. History of total replacement of both hip joints     Plan: Reviewed MRI scan plain radiographs hip x-rays look good her problem is foraminal stenosis mid upper lumbar multiple levels with secondary trochanteric bursitis.  Drug injection performed which significantly helped her pain she is walking better postinjection.  If she has ongoing problems we discussed possible diagnostic foraminal injections if we can determine a single level with causing most of her problems.  She has 3 foramina that are tight on the left and we discussed that if all 3 are some contributing factor then with other degenerative changes in her back multilevel instrumented fusion at this point I would avoid.  She might be a candidate for single level foraminotomies if 1 level was the symptomatic level.  Follow-Up Instructions: No follow-ups on file.   Orders:  Orders Placed This Encounter  Procedures   Large Joint Inj: L greater trochanter   No orders of the defined types were placed in this encounter.     Procedures: Large Joint Inj: L greater trochanter on 05/15/2022 11:28 AM Details: lateral approach Medications: 1 mL lidocaine 1 %; 2 mL bupivacaine 0.25 %; 40 mg methylPREDNISolone acetate 40 MG/ML      Clinical Data: No additional findings.   Subjective: Chief Complaint  Patient presents with   Left Leg - Pain, Follow-up    HPI 61 year old female returns she has had bilateral total arthroplasties and had a recent injection for left-sided foraminal stenosis.  States pain from her buttocks around her trochanter down to her knee is  about a 5-10 a day to 10 she has been taking some hydrocodone it does not help.  She had foraminal injections on the left which gave her about 3 days of relief.  She thinks she got better relief from the trochanteric injection 04/10/2022.  Patient is lumbar curvature no central compression but foraminal compression at several levels mid and upper lumbar on the left.  Review of Systems updated unchanged from 04/10/2022 office visit.   Objective: Vital Signs: Ht 5\' 5"  (1.651 m)   Wt 230 lb (104.3 kg)   BMI 38.27 kg/m   Physical Exam Constitutional:      Appearance: She is well-developed.  HENT:     Head: Normocephalic.     Right Ear: External ear normal.     Left Ear: External ear normal. There is no impacted cerumen.  Eyes:     Pupils: Pupils are equal, round, and reactive to light.  Neck:     Thyroid: No thyromegaly.     Trachea: No tracheal deviation.  Cardiovascular:     Rate and Rhythm: Normal rate.  Pulmonary:     Effort: Pulmonary effort is normal.  Abdominal:     Palpations: Abdomen is soft.  Musculoskeletal:     Cervical back: No rigidity.  Skin:    General: Skin is warm and dry.  Neurological:     Mental Status: She is alert  and oriented to person, place, and time.  Psychiatric:        Behavior: Behavior normal.     Ortho Exam well-healed hip incisions right left.  Exquisite trochanteric bursa tenderness on the left with antalgic gait.  Some sciatic notch tenderness.  Negative straight leg raising 90 degrees anterior tib gastrocsoleus is active.  No trochanteric tenderness on the right.  Specialty Comments:  EXAM: MRI LUMBAR SPINE WITHOUT CONTRAST   TECHNIQUE: Multiplanar, multisequence MR imaging of the lumbar spine was performed. No intravenous contrast was administered.   COMPARISON:  04/27/2020   FINDINGS: Segmentation:  5 lumbar-type vertebral bodies.   Alignment: Scoliosis, with dextroscoliosis of the lumbar spine. Trace retrolisthesis of T12 on L1  and L1 on L2 and trace anterolisthesis of L5 on S1, unchanged.   Vertebrae: No acute fracture or suspicious osseous lesion. Endplate degenerative changes. 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   Conus medullaris and cauda equina: Conus extends to the L1 level. Conus and cauda equina appear normal.   Paraspinal and other soft tissues: Asymmetric fatty atrophy of the superior left psoas and inferior right-greater-than-left paraspinous muscles.   Disc levels:   T12-L1: Mild disc bulge. Mild facet arthropathy. No spinal canal stenosis or neural foraminal narrowing.   L1-L2: Mild left eccentric disc bulge. Left-greater-than-right facet arthropathy. Narrowing of the left lateral recess. No spinal canal stenosis. Mild left neural foraminal narrowing, which has progressed from the prior exam.   L2-L3: Mild disc bulge with left paracentral and subarticular protrusion which narrows the left lateral recess. Severe left and moderate right facet arthropathy. No spinal canal stenosis. Mild-to-moderate left neural foraminal narrowing which has progressed from the prior exam   L3-L4: Mild disc bulge with left foraminal and extreme lateral disc protrusion. Moderate facet arthropathy. Narrowing of the lateral recesses. No spinal canal stenosis. Moderate left neural foraminal narrowing, which has progressed from the prior exam.   L4-L5: Mild disc bulge with right foraminal and extreme lateral protrusion. Severe right and mild left facet arthropathy. Narrowing of the right lateral recess. No spinal canal stenosis. Mild right neural foraminal narrowing, unchanged.   L5-S1: Mild disc bulge. Moderate facet arthropathy. No spinal canal stenosis. Mild right neural foraminal narrowing, which has progressed from the prior exam.   IMPRESSION: 1. Progression of previously noted asymmetric degenerative changes with  progressive left neural foraminal narrowing in the upper lumbar spine, which is now moderate at L3-L4, mild-to-moderate at L2-L3, and mild at L1-L2. Narrowing of the left lateral recesses at L1-L2 and L2-L3 and bilateral lateral recesses L3-L4 could affect the descending left L2 and L3 nerve roots and bilateral L4 nerve roots, respectively. 2. Mild right neural foraminal narrowing at L4-L5 and L5-S1. Narrowing of the right lateral recess at L4-L5 could affect the descending right L5 nerve roots.     Electronically Signed   By: Wiliam Ke M.D.   On: 02/05/2022 13:58  Imaging: No results found.   PMFS History: Patient Active Problem List   Diagnosis Date Noted   Facet degeneration of lumbar region 02/13/2022   Peripheral vascular disease with stasis dermatitis 09/03/2021   Urine frequency 05/10/2021   Cellulitis of left leg 01/18/2021   Rash 01/18/2021   History of bilateral hip arthroplasty 01/03/2021   Upper respiratory tract infection 12/30/2020   Non-recurrent acute serous otitis media of right ear 12/30/2020   Pharyngitis 10/29/2020   Trochanteric bursitis, left hip 09/13/2020  Closed fracture of multiple pubic rami, left, sequela 08/16/2020   H/O adenomatous polyp of colon 05/23/2020   FH: colon cancer in relative diagnosed at >72 years old 05/23/2020   Spinal stenosis of lumbar region 05/03/2020   Impingement syndrome of right shoulder 05/03/2020   H/O total hip arthroplasty 12/06/2019   Status post total replacement of left hip 09/15/2019   Back pain 07/01/2018   DDD (degenerative disc disease), lumbosacral 07/01/2018   Venous insufficiency of both lower extremities 11/17/2016   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 level with radiculopathy 08/18/2016   Morbid obesity with BMI of 40.0-44.9, adult (HCC) 07/28/2016   Chronic pain syndrome 10/24/2015   Dysfunction of both eustachian tubes 07/26/2014   Calculus of left kidney 04/25/2014   Impaired fasting glucose  04/05/2014   History of bladder surgery 04/03/2014   History of hysterectomy 04/03/2014   Urge incontinence 04/03/2014   Asthma 07/28/2013   Nontoxic uninodular goiter 07/28/2013   Solitary pulmonary nodule 07/28/2013   Lumbar radiculopathy 04/20/2013   Insomnia 03/22/2013   Essential hypertension 08/18/2011   Tobacco use disorder 08/18/2011   Moderate episode of recurrent major depressive disorder (HCC) 04/03/2011   Other B-complex deficiencies 08/16/2010   Family history of cerebrovascular accident (CVA) 04/17/2010   Family history of malignant neoplasm of gastrointestinal tract 04/17/2010   Family history of other cardiovascular diseases(V17.49) 04/17/2010   Past Medical History:  Diagnosis Date   Allergy    Anemia    history of   Anxiety    Arthritis    Asthma    Atherosclerosis    Depression    Family history of adverse reaction to anesthesia    pt's mother felt everything and couldn't speak during a gallbladder surgery   GERD (gastroesophageal reflux disease)    History of hiatal hernia    History of kidney stones    Hypertension    Pneumonia    a few times   TIA (transient ischemic attack) 2012    Family History  Problem Relation Age of Onset   Anxiety disorder Mother    Depression Mother    Heart disease Mother    Hypertension Mother    Arrhythmia Mother    Cancer Father        colon, age greater than 64   Lung cancer Father    Migraines Sister    Cancer Daughter        nerve sheath sarcoma   Alcohol abuse Brother    Breast cancer Neg Hx     Past Surgical History:  Procedure Laterality Date   ABDOMINAL HYSTERECTOMY     abdominal   APPENDECTOMY     bladder tack     CARPAL TUNNEL RELEASE Bilateral    CERVICAL SPINE SURGERY     5-6, 6-7   CHOLECYSTECTOMY     COLONOSCOPY WITH PROPOFOL N/A 07/10/2020   Procedure: COLONOSCOPY WITH PROPOFOL;  Surgeon: Lanelle Bal, DO;  [facility];  Service: Endoscopy;  Laterality: N/A;  12:15pm    ENDOVENOUS ABLATION SAPHENOUS VEIN W/ LASER Left 11/28/2021   endovenous laser ablation left greater saphenous vein by Coral Else MD   HAMMER TOE SURGERY Right    JOINT REPLACEMENT     TOTAL HIP 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   Procedure: LEFT TOTAL HIP ARTHROPLASTY -DIRECT ANTERIOR;  Surgeon: Eldred Manges, MD;  Location: [facility];  Service: Orthopedics;  Laterality: Left;   TOTAL HIP ARTHROPLASTY Right 12/05/2019   Procedure: RIGHT TOTAL HIP ARTHROPLASTY ANTERIOR  APPROACH  DIRECT ANTERIOR;  Surgeon: Marybelle Killings, MD;  Location: [facility];  Service: Orthopedics;  Laterality: Right;   Social History   Occupational History   Not on file  Tobacco Use   Smoking status: Every Day    Packs/day: 0.50    Years: 23.00    Total pack years: 11.50    Types: Cigarettes   Smokeless tobacco: Never  Vaping Use   Vaping Use: Never used  Substance and Sexual Activity   Alcohol use: Yes    Comment: occ   Drug use: Never   Sexual activity: Not Currently

## 2022-06-02 ENCOUNTER — Encounter: Payer: Self-pay | Admitting: Orthopaedic Surgery

## 2022-06-02 ENCOUNTER — Ambulatory Visit (INDEPENDENT_AMBULATORY_CARE_PROVIDER_SITE_OTHER): Payer: No Typology Code available for payment source | Admitting: Orthopaedic Surgery

## 2022-06-02 VITALS — BP 121/76 | Ht 65.0 in | Wt 230.0 lb

## 2022-06-02 DIAGNOSIS — M5416 Radiculopathy, lumbar region: Secondary | ICD-10-CM | POA: Diagnosis not present

## 2022-06-02 DIAGNOSIS — M47816 Spondylosis without myelopathy or radiculopathy, lumbar region: Secondary | ICD-10-CM

## 2022-06-02 DIAGNOSIS — M5116 Intervertebral disc disorders with radiculopathy, lumbar region: Secondary | ICD-10-CM | POA: Diagnosis not present

## 2022-06-02 NOTE — Progress Notes (Signed)
Office Visit Note   Patient: Sydney [name]           Date of Birth: 24-Nov-1960           MRN: [id number] Visit Date: 06/02/2022              Requested by: Raliegh Ip, DO [address] [place],  [place] [address] PCP: Raliegh Ip, DO   Assessment & Plan: Visit Diagnoses:  1. Facet degeneration of lumbar region   2. Intervertebral disc disorders with radiculopathy, lumbar region   3. Lumbar radiculopathy     Plan: Patient has facet arthropathy with multiple level foraminal narrowing on the left side most significant at L2-3 and L3-4.  She has had a radial frequency ablation on the right side in the past and she states her right side is doing well.  Left side is giving her increased problems and we will set her up for epidural injections with Dr. Alvester Morin who can discuss foraminal versus facet injection.  She does have lumbar curvature and some mild to moderate level involvement added least 4 5 levels.  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  She can follow-up with me after injection with Dr. Alvester Morin.  We discussed that she may need more than 1 injection to determine whether this is foraminal versus facet mediated.  Follow-Up Instructions: No follow-ups on file.   Orders:  Orders Placed This Encounter  Procedures   Ambulatory referral to Physical Medicine Rehab   No orders of the defined types were placed in this encounter.     Procedures: No procedures performed   Clinical Data: No additional findings.   Subjective: Chief Complaint  Patient presents with   Left Leg - Pain    HPI 61 year old female returns with ongoing problems with back and left leg pain.  She had trochanteric injection 05/15/2022.  Pain over the trochanter is improved but she continues to have the back pain radiates into her buttocks and into her left leg radiates down to her knee and just stops at the tibial tubercle.  She has  problems doing her housework has increased pain with turning twisting and bending.  She has been ambulating with a cane.  She has been taking hydrocodone as well as Flexeril.  Hydrocodone has been occasional and 30 tablets usually last her 1 or 2 months.  Review of Systems 14 point system unchanged from 04/10/2022 other than as mentioned above.   Objective: Vital Signs: BP 121/76   Ht 5\' 5"  (1.651 m)   Wt 230 lb (104.3 kg)   BMI 38.27 kg/m   Physical Exam Constitutional:      Appearance: She is well-developed.  HENT:     Head: Normocephalic.     Right Ear: External ear normal.     Left Ear: External ear normal. There is no impacted cerumen.  Eyes:     Pupils: Pupils are equal, round, and reactive to light.  Neck:     Thyroid: No thyromegaly.     Trachea: No tracheal deviation.  Cardiovascular:     Rate and Rhythm: Normal rate.  Pulmonary:     Effort: Pulmonary effort is normal.  Abdominal:     Palpations: Abdomen is soft.  Musculoskeletal:     Cervical back: No rigidity.  Skin:    General: Skin is warm and dry.  Neurological:     Mental Status: She is alert and oriented to person,  place, and time.  Psychiatric:        Behavior: Behavior normal.     Ortho Exam hip incisions well-healed.  Minimal tenderness over the bursa.  She is amatory with a cane.  Sciatic notch tenderness on the left that radiates into the left thigh.  Knee range of motion is full negative logroll the hips.  Specialty Comments:  EXAM: MRI LUMBAR SPINE WITHOUT CONTRAST   TECHNIQUE: Multiplanar, multisequence MR imaging of the lumbar spine was performed. No intravenous contrast was administered.   COMPARISON:  04/27/2020   FINDINGS: Segmentation:  5 lumbar-type vertebral bodies.   Alignment: Scoliosis, with dextroscoliosis of the lumbar spine. Trace retrolisthesis of T12 on L1 and L1 on L2 and trace anterolisthesis of L5 on S1, unchanged.   Vertebrae: No acute fracture or suspicious osseous  lesion. Endplate degenerative changes. 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   Conus medullaris and cauda equina: Conus extends to the L1 level. Conus and cauda equina appear normal.   Paraspinal and other soft tissues: Asymmetric fatty atrophy of the superior left psoas and inferior right-greater-than-left paraspinous muscles.   Disc levels:   T12-L1: Mild disc bulge. Mild facet arthropathy. No spinal canal stenosis or neural foraminal narrowing.   L1-L2: Mild left eccentric disc bulge. Left-greater-than-right facet arthropathy. Narrowing of the left lateral recess. No spinal canal stenosis. Mild left neural foraminal narrowing, which has progressed from the prior exam.   L2-L3: Mild disc bulge with left paracentral and subarticular protrusion which narrows the left lateral recess. Severe left and moderate right facet arthropathy. No spinal canal stenosis. Mild-to-moderate left neural foraminal narrowing which has progressed from the prior exam   L3-L4: Mild disc bulge with left foraminal and extreme lateral disc protrusion. Moderate facet arthropathy. Narrowing of the lateral recesses. No spinal canal stenosis. Moderate left neural foraminal narrowing, which has progressed from the prior exam.   L4-L5: Mild disc bulge with right foraminal and extreme lateral protrusion. Severe right and mild left facet arthropathy. Narrowing of the right lateral recess. No spinal canal stenosis. Mild right neural foraminal narrowing, unchanged.   L5-S1: Mild disc bulge. Moderate facet arthropathy. No spinal canal stenosis. Mild right neural foraminal narrowing, which has progressed from the prior exam.   IMPRESSION: 1. Progression of previously noted asymmetric degenerative changes with progressive left neural foraminal narrowing in the upper lumbar spine, which is now moderate at L3-L4, mild-to-moderate at  L2-L3, and mild at L1-L2. Narrowing of the left lateral recesses at L1-L2 and L2-L3 and bilateral lateral recesses L3-L4 could affect the descending left L2 and L3 nerve roots and bilateral L4 nerve roots, respectively. 2. Mild right neural foraminal narrowing at L4-L5 and L5-S1. Narrowing of the right lateral recess at L4-L5 could affect the descending right L5 nerve roots.     Electronically Signed   By: Wiliam Ke M.D.   On: 02/05/2022 13:58  Imaging: No results found.   PMFS History: Patient Active Problem List   Diagnosis Date Noted   Facet degeneration of lumbar region 02/13/2022   Peripheral vascular disease with stasis dermatitis 09/03/2021   Urine frequency 05/10/2021   Cellulitis of left leg 01/18/2021   Rash 01/18/2021   History of bilateral hip arthroplasty 01/03/2021   Upper respiratory tract infection 12/30/2020   Non-recurrent acute serous otitis media of right ear 12/30/2020   Pharyngitis 10/29/2020   Trochanteric bursitis, left hip 09/13/2020   Closed fracture of  multiple pubic rami, left, sequela 08/16/2020   H/O adenomatous polyp of colon 05/23/2020   FH: colon cancer in relative diagnosed at >62 years old 05/23/2020   Spinal stenosis of lumbar region 05/03/2020   Impingement syndrome of right shoulder 05/03/2020   H/O total hip arthroplasty 12/06/2019   Status post total replacement of left hip 09/15/2019   Back pain 07/01/2018   DDD (degenerative disc disease), lumbosacral 07/01/2018   Venous insufficiency of both lower extremities 11/17/2016   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 level with radiculopathy 08/18/2016   Morbid obesity with BMI of 40.0-44.9, adult (HCC) 07/28/2016   Chronic pain syndrome 10/24/2015   Dysfunction of both eustachian tubes 07/26/2014   Calculus of left kidney 04/25/2014   Impaired fasting glucose 04/05/2014   History of bladder surgery 04/03/2014   History of hysterectomy 04/03/2014   Urge incontinence 04/03/2014    Asthma 07/28/2013   Nontoxic uninodular goiter 07/28/2013   Solitary pulmonary nodule 07/28/2013   Lumbar radiculopathy 04/20/2013   Insomnia 03/22/2013   Essential hypertension 08/18/2011   Tobacco use disorder 08/18/2011   Moderate episode of recurrent major depressive disorder (HCC) 04/03/2011   Other B-complex deficiencies 08/16/2010   Family history of cerebrovascular accident (CVA) 04/17/2010   Family history of malignant neoplasm of gastrointestinal tract 04/17/2010   Family history of other cardiovascular diseases(V17.49) 04/17/2010   Past Medical History:  Diagnosis Date   Allergy    Anemia    history of   Anxiety    Arthritis    Asthma    Atherosclerosis    Depression    Family history of adverse reaction to anesthesia    pt's mother felt everything and couldn't speak during a gallbladder surgery   GERD (gastroesophageal reflux disease)    History of hiatal hernia    History of kidney stones    Hypertension    Pneumonia    a few times   TIA (transient ischemic attack) 2012    Family History  Problem Relation Age of Onset   Anxiety disorder Mother    Depression Mother    Heart disease Mother    Hypertension Mother    Arrhythmia Mother    Cancer Father        colon, age greater than 2860   Lung cancer Father    Migraines Sister    Cancer Daughter        nerve sheath sarcoma   Alcohol abuse Brother    Breast cancer Neg Hx     Past Surgical History:  Procedure Laterality Date   ABDOMINAL HYSTERECTOMY     abdominal   APPENDECTOMY     bladder tack     CARPAL TUNNEL RELEASE Bilateral    CERVICAL SPINE SURGERY     5-6, 6-7   CHOLECYSTECTOMY     COLONOSCOPY WITH PROPOFOL N/A 07/10/2020   Procedure: COLONOSCOPY WITH PROPOFOL;  Surgeon: Lanelle Balarver, Charles K, DO;  Location: [facility];  Service: Endoscopy;  Laterality: N/A;  12:15pm   ENDOVENOUS ABLATION SAPHENOUS VEIN W/ LASER Left 11/28/2021   endovenous laser ablation left greater saphenous vein by Coral ElseVance  Brabham MD   HAMMER TOE SURGERY Right    JOINT REPLACEMENT     TOTAL HIP 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   Procedure: LEFT TOTAL HIP ARTHROPLASTY -DIRECT ANTERIOR;  Surgeon: Eldred MangesYates, Morty Ortwein C, MD;  Location: [facility] OR;  Service: Orthopedics;  Laterality: Left;   TOTAL HIP ARTHROPLASTY Right 12/05/2019   Procedure: RIGHT TOTAL HIP ARTHROPLASTY ANTERIOR APPROACH  DIRECT  ANTERIOR;  Surgeon: Eldred Manges, MD;  Location: [facility] OR;  Service: Orthopedics;  Laterality: Right;   Social History   Occupational History   Not on file  Tobacco Use   Smoking status: Every Day    Packs/day: 0.50    Years: 23.00    Total pack years: 11.50    Types: Cigarettes   Smokeless tobacco: Never  Vaping Use   Vaping Use: Never used  Substance and Sexual Activity   Alcohol use: Yes    Comment: occ   Drug use: Never   Sexual activity: Not Currently

## 2022-06-09 ENCOUNTER — Encounter: Payer: Self-pay | Admitting: Physical Medicine and Rehabilitation

## 2022-06-09 ENCOUNTER — Ambulatory Visit: Payer: No Typology Code available for payment source | Admitting: Physical Medicine and Rehabilitation

## 2022-06-09 VITALS — BP 161/84 | HR 84

## 2022-06-09 DIAGNOSIS — M47816 Spondylosis without myelopathy or radiculopathy, lumbar region: Secondary | ICD-10-CM

## 2022-06-09 DIAGNOSIS — M5416 Radiculopathy, lumbar region: Secondary | ICD-10-CM | POA: Diagnosis not present

## 2022-06-09 DIAGNOSIS — M4726 Other spondylosis with radiculopathy, lumbar region: Secondary | ICD-10-CM | POA: Diagnosis not present

## 2022-06-09 DIAGNOSIS — M5116 Intervertebral disc disorders with radiculopathy, lumbar region: Secondary | ICD-10-CM

## 2022-06-09 DIAGNOSIS — G894 Chronic pain syndrome: Secondary | ICD-10-CM

## 2022-06-09 NOTE — Patient Instructions (Signed)

## 2022-06-09 NOTE — Progress Notes (Signed)
Lumbar back pain with radiation to left hip

## 2022-06-09 NOTE — Progress Notes (Signed)
Sydney [name] Wienke - 61 y.o. female MRN [id number]  Date of birth: 11/05/1960  Office Visit Note: Visit Date: 06/09/2022 PCP: Raliegh IpGottschalk, Ashly M, DO Referred by: Raliegh IpGottschalk, Ashly M, DO  Subjective: Chief Complaint  Patient presents with   Lower Back - Numbness, Pain   Left Hip - Numbness, Pain   HPI: Sydney [name] Bothun is a 61 y.o. female who comes in today for evaluation of chronic, worsening and severe left sided lower back pain radiating down left posterolateral thigh to knee. Pain ongoing for several years and is exacerbated by movement and activity. She describes pain as sharp, shooting and burning sensation, currently rates as 8 out of 10. Some relief of pain with home exercise regimen, rest and use of medications. She is currently taking Norco that is prescribed by her primary care provider Dr. Delynn FlavinAshly Gottschalk. History of formal physical therapy at [facility] in 2022, some relief of pain with these treatments. Recent lumbar MRI imaging exhibits multilevel facet arthropathy, left paracentral and subarticular protrusion which narrows the left lateral recess at L2-L3, there is also extreme lateral disc protrusion at L3-L4 causing bilateral recess narrowing. No high grade spinal canal stenosis noted. Previously treated at [facility] and by Dr. Sheran Luzichard Ramos [facility] where she underwent multiple epidural steroid injections and radiofrequency ablation. Ablation procedure by Dr. Ethelene Halamos has helped to alleviate right sided lower back pain. Patient underwent left L3 transforaminal epidural steroid [facility] on 03/03/2022 with greater than 50% relief of pain for 3 days. Patient reports severe pain is negatively impacting her daily life. Using cane to assist with ambulation. Patient denies focal weakness, numbness and tingling. Patient denies recent trauma or falls.       Review of Systems  Musculoskeletal:  Positive for back pain.  Neurological:  Negative for  tingling, sensory change, focal weakness and weakness.  All other systems reviewed and are negative.  Otherwise per HPI.  Assessment & Plan: Visit Diagnoses:    ICD-10-CM   1. Lumbar radiculopathy  M54.16     2. Intervertebral disc disorders with radiculopathy, lumbar region  M51.16     3. Other spondylosis with radiculopathy, lumbar region  M47.26     4. Facet degeneration of lumbar region  M47.816     5. Chronic pain syndrome  G89.4        Plan: Findings:  Chronic, worsening and severe left sided lower back pain radiating down left posterolateral thigh to knee. Patient continues to have severe pain despite good conservative therapies such as formal physical therapy, home exercise regimen, rest and use of medications. Patients clinical presentation and exam are consistent with L3 nerve pattern. There is narrowing of bilateral lateral recesses at L3-L4 that could affect the descending left L2 and L3 nerve roots and bilateral L4 nerve roots. Next step is to perform diagnostic and hopefully therapeutic left L3-L4 interlaminar epidural steroid injection under fluoroscopic guidance. Patient is not currently taking anticoagulant medications. If good relief of pain with injection we can repeat this procedure infrequently as needed. If her pain persists post injection we could look at diagnostic medial branch blocks and possible radiofrequency ablation on the left side as this could declare itself to be a facet joint syndrome. Patient encouraged to continue with current medication regimen and to remain active as tolerated. No red flag symptoms noted upon exam today.      Meds & Orders: No orders of the defined types were placed in this encounter.  No orders of the defined types were placed in this encounter.   Follow-up: Return for Left L3-L4 interlaminar epidural steroid injection.   Procedures: No procedures performed      Clinical History: EXAM: MRI LUMBAR SPINE WITHOUT CONTRAST    TECHNIQUE: Multiplanar, multisequence MR imaging of the lumbar spine was performed. No intravenous contrast was administered.   COMPARISON:  04/27/2020   FINDINGS: Segmentation:  5 lumbar-type vertebral bodies.   Alignment: Scoliosis, with dextroscoliosis of the lumbar spine. Trace retrolisthesis of T12 on L1 and L1 on L2 and trace anterolisthesis of L5 on S1, unchanged.   Vertebrae: No acute fracture or suspicious osseous lesion. Endplate degenerative changes. 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   Conus medullaris and cauda equina: Conus extends to the L1 level. Conus and cauda equina appear normal.   Paraspinal and other soft tissues: Asymmetric fatty atrophy of the superior left psoas and inferior right-greater-than-left paraspinous muscles.   Disc levels:   T12-L1: Mild disc bulge. Mild facet arthropathy. No spinal canal stenosis or neural foraminal narrowing.   L1-L2: Mild left eccentric disc bulge. Left-greater-than-right facet arthropathy. Narrowing of the left lateral recess. No spinal canal stenosis. Mild left neural foraminal narrowing, which has progressed from the prior exam.   L2-L3: Mild disc bulge with left paracentral and subarticular protrusion which narrows the left lateral recess. Severe left and moderate right facet arthropathy. No spinal canal stenosis. Mild-to-moderate left neural foraminal narrowing which has progressed from the prior exam   L3-L4: Mild disc bulge with left foraminal and extreme lateral disc protrusion. Moderate facet arthropathy. Narrowing of the lateral recesses. No spinal canal stenosis. Moderate left neural foraminal narrowing, which has progressed from the prior exam.   L4-L5: Mild disc bulge with right foraminal and extreme lateral protrusion. Severe right and mild left facet arthropathy. Narrowing of the right lateral recess. No spinal canal  stenosis. Mild right neural foraminal narrowing, unchanged.   L5-S1: Mild disc bulge. Moderate facet arthropathy. No spinal canal stenosis. Mild right neural foraminal narrowing, which has progressed from the prior exam.   IMPRESSION: 1. Progression of previously noted asymmetric degenerative changes with progressive left neural foraminal narrowing in the upper lumbar spine, which is now moderate at L3-L4, mild-to-moderate at L2-L3, and mild at L1-L2. Narrowing of the left lateral recesses at L1-L2 and L2-L3 and bilateral lateral recesses L3-L4 could affect the descending left L2 and L3 nerve roots and bilateral L4 nerve roots, respectively. 2. Mild right neural foraminal narrowing at L4-L5 and L5-S1. Narrowing of the right lateral recess at L4-L5 could affect the descending right L5 nerve roots.     Electronically Signed   By: Wiliam Ke M.D.   On: 02/05/2022 13:58   She reports that she has been smoking cigarettes. She has a 11.50 pack-year smoking history. She has never used smokeless tobacco. No results for input(s): "HGBA1C", "LABURIC" in the last 8760 hours.  Objective:  VS:  HT:    WT:   BMI:     BP:(!) 161/84  HR:84bpm  TEMP: ( )  RESP:  Physical Exam Vitals and nursing note reviewed.  HENT:     Head: Normocephalic and atraumatic.     Right Ear: External ear normal.     Left Ear: External ear normal.     Nose: Nose normal.     Mouth/Throat:     Mouth: Mucous membranes are moist.  Eyes:  Pupils: Pupils are equal, round, and reactive to light.  Cardiovascular:     Rate and Rhythm: Normal rate.     Pulses: Normal pulses.  Pulmonary:     Effort: Pulmonary effort is normal.  Abdominal:     General: Abdomen is flat. There is no distension.  Musculoskeletal:        General: Tenderness present.     Cervical back: Normal range of motion.     Comments: Pt is slow to rise from seated position to standing. Good lumbar range of motion. Strong distal strength  without clonus, no pain upon palpation of greater trochanters. Sensation intact bilaterally. Dysesthesias noted to left L3 dermatome. Ambulates with cane, gait unsteady.  Skin:    General: Skin is warm and dry.     Capillary Refill: Capillary refill takes less than 2 seconds.  Neurological:     General: No focal deficit present.     Mental Status: She is alert and oriented to person, place, and time.  Psychiatric:        Mood and Affect: Mood normal.        Behavior: Behavior normal.     Ortho Exam  Imaging: No results found.  Past Medical/Family/Surgical/Social History: Medications & Allergies reviewed per EMR, new medications updated. Patient Active Problem List   Diagnosis Date Noted   Facet degeneration of lumbar region 02/13/2022   Peripheral vascular disease with stasis dermatitis 09/03/2021   Urine frequency 05/10/2021   Cellulitis of left leg 01/18/2021   Rash 01/18/2021   History of bilateral hip arthroplasty 01/03/2021   Upper respiratory tract infection 12/30/2020   Non-recurrent acute serous otitis media of right ear 12/30/2020   Pharyngitis 10/29/2020   Trochanteric bursitis, left hip 09/13/2020   Closed fracture of multiple pubic rami, left, sequela 08/16/2020   H/O adenomatous polyp of colon 05/23/2020   FH: colon cancer in relative diagnosed at >79 years old 05/23/2020   Spinal stenosis of lumbar region 05/03/2020   Impingement syndrome of right shoulder 05/03/2020   H/O total hip arthroplasty 12/06/2019   Status post total replacement of left hip 09/15/2019   Back pain 07/01/2018   DDD (degenerative disc disease), lumbosacral 07/01/2018   Venous insufficiency of both lower extremities 11/17/2016   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 level with radiculopathy 08/18/2016   Morbid obesity with BMI of 40.0-44.9, adult (HCC) 07/28/2016   Chronic pain syndrome 10/24/2015   Dysfunction of both eustachian tubes 07/26/2014   Calculus of left kidney 04/25/2014    Impaired fasting glucose 04/05/2014   History of bladder surgery 04/03/2014   History of hysterectomy 04/03/2014   Urge incontinence 04/03/2014   Asthma 07/28/2013   Nontoxic uninodular goiter 07/28/2013   Solitary pulmonary nodule 07/28/2013   Lumbar radiculopathy 04/20/2013   Insomnia 03/22/2013   Essential hypertension 08/18/2011   Tobacco use disorder 08/18/2011   Moderate episode of recurrent major depressive disorder (HCC) 04/03/2011   Other B-complex deficiencies 08/16/2010   Family history of cerebrovascular accident (CVA) 04/17/2010   Family history of malignant neoplasm of gastrointestinal tract 04/17/2010   Family history of other cardiovascular diseases(V17.49) 04/17/2010   Past Medical History:  Diagnosis Date   Allergy    Anemia    history of   Anxiety    Arthritis    Asthma    Atherosclerosis    Depression    Family history of adverse reaction to anesthesia    pt's mother felt everything and couldn't speak during a gallbladder surgery  GERD (gastroesophageal reflux disease)    History of hiatal hernia    History of kidney stones    Hypertension    Pneumonia    a few times   TIA (transient ischemic attack) 2012   Family History  Problem Relation Age of Onset   Anxiety disorder Mother    Depression Mother    Heart disease Mother    Hypertension Mother    Arrhythmia Mother    Cancer Father        colon, age greater than 39   Lung cancer Father    Migraines Sister    Cancer Daughter        nerve sheath sarcoma   Alcohol abuse Brother    Breast cancer Neg Hx    Past Surgical History:  Procedure Laterality Date   ABDOMINAL HYSTERECTOMY     abdominal   APPENDECTOMY     bladder tack     CARPAL TUNNEL RELEASE Bilateral    CERVICAL SPINE SURGERY     5-6, 6-7   CHOLECYSTECTOMY     COLONOSCOPY WITH PROPOFOL N/A 07/10/2020   Procedure: COLONOSCOPY WITH PROPOFOL;  Surgeon: Lanelle Bal, DO;  Location: [facility];  Service: Endoscopy;   Laterality: N/A;  12:15pm   ENDOVENOUS ABLATION SAPHENOUS VEIN W/ LASER Left 11/28/2021   endovenous laser ablation left greater saphenous vein by Coral Else MD   HAMMER TOE SURGERY Right    JOINT REPLACEMENT     TOTAL HIP 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   Procedure: LEFT TOTAL HIP ARTHROPLASTY -DIRECT ANTERIOR;  Surgeon: Eldred Manges, MD;  Location: [facility] OR;  Service: Orthopedics;  Laterality: Left;   TOTAL HIP ARTHROPLASTY Right 12/05/2019   Procedure: RIGHT TOTAL HIP ARTHROPLASTY ANTERIOR APPROACH  DIRECT ANTERIOR;  Surgeon: Eldred Manges, MD;  Location: [facility] OR;  Service: Orthopedics;  Laterality: Right;   Social History   Occupational History   Not on file  Tobacco Use   Smoking status: Every Day    Packs/day: 0.50    Years: 23.00    Total pack years: 11.50    Types: Cigarettes   Smokeless tobacco: Never  Vaping Use   Vaping Use: Never used  Substance and Sexual Activity   Alcohol use: Yes    Comment: occ   Drug use: Never   Sexual activity: Not Currently

## 2022-06-24 ENCOUNTER — Ambulatory Visit (INDEPENDENT_AMBULATORY_CARE_PROVIDER_SITE_OTHER): Payer: No Typology Code available for payment source | Admitting: Physical Medicine and Rehabilitation

## 2022-06-24 ENCOUNTER — Ambulatory Visit: Payer: Self-pay

## 2022-06-24 VITALS — BP 131/82 | HR 68

## 2022-06-24 DIAGNOSIS — M5416 Radiculopathy, lumbar region: Secondary | ICD-10-CM | POA: Diagnosis not present

## 2022-06-24 MED ORDER — METHYLPREDNISOLONE ACETATE 80 MG/ML IJ SUSP
80.0000 mg | Freq: Once | INTRAMUSCULAR | Status: AC
  Administered 2022-06-24: 80 mg

## 2022-06-24 NOTE — Patient Instructions (Signed)

## 2022-06-24 NOTE — Progress Notes (Signed)
Numeric Pain Rating Scale and Functional Assessment Average Pain 7   In the last MONTH (on 0-10 scale) has pain interfered with the following?  1. General activity like being  able to carry out your everyday physical activities such as walking, climbing stairs, carrying groceries, or moving a chair?  Rating(10)   +Driver, -BT, -Dye Allergies.  Lower back pain on left side that radiates down to the knee.Has swelling and pressure in upper leg and groin area. Burning and tingling on outer part of leg

## 2022-07-02 NOTE — Procedures (Signed)
Lumbar Epidural Steroid Injection - Interlaminar Approach with Fluoroscopic Guidance  Patient: Sydney [name]      Date of Birth: 1961-05-24 MRN: [id number] PCP: Raliegh Ip, DO      Visit Date: 06/24/2022   Universal Protocol:     Consent Given By: the patient  Position: PRONE  Additional Comments: Vital signs were monitored before and after the procedure. Patient was prepped and draped in the usual sterile fashion. The correct patient, procedure, and site was verified.   Injection Procedure Details:   Procedure diagnoses: Lumbar radiculopathy [M54.16]   Meds Administered:  Meds ordered this encounter  Medications   methylPREDNISolone acetate (DEPO-MEDROL) injection 80 mg     Laterality: Left  Location/Site:  L3-4  Needle: 3.5 in., 20 ga. Tuohy  Needle Placement: Paramedian epidural  Findings:   -Comments: Excellent flow of contrast into the epidural space.  Procedure Details: Using a paramedian approach from the side mentioned above, the region overlying the inferior lamina was localized under fluoroscopic visualization and the soft tissues overlying this structure were infiltrated with 4 ml. of 1% Lidocaine without Epinephrine. The Tuohy needle was inserted into the epidural space using a paramedian approach.   The epidural space was localized using loss of resistance along with counter oblique bi-planar fluoroscopic views.  After negative aspirate for air, blood, and CSF, a 2 ml. volume of Isovue-250 was injected into the epidural space and the flow of contrast was observed. Radiographs were obtained for documentation purposes.    The injectate was administered into the level noted above.   Additional Comments:  The patient tolerated the procedure well Dressing: 2 x 2 sterile gauze and Band-Aid    Post-procedure details: Patient was observed during the procedure. Post-procedure instructions were reviewed.  Patient left the clinic in stable  condition.

## 2022-07-02 NOTE — Progress Notes (Signed)
Sydney [name] - 61 y.o. female MRN [id number]  Date of birth: 12-Apr-1961  Office Visit Note: Visit Date: 06/24/2022 PCP: Raliegh Ip, DO Referred by: Raliegh Ip, DO  Subjective: Chief Complaint  Patient presents with   Lower Back - Pain   HPI:  Sydney [name] is a 61 y.o. female who comes in today at the request of Dr. Annell Greening and Ellin Goodie, FNP for planned Left L3-4 Lumbar Interlaminar epidural steroid injection with fluoroscopic guidance.  The patient has failed conservative care including home exercise, medications, time and activity modification.  This injection will be diagnostic and hopefully therapeutic.  Please see requesting physician notes for further details and justification.   ROS Otherwise per HPI.  Assessment & Plan: Visit Diagnoses:    ICD-10-CM   1. Lumbar radiculopathy  M54.16 XR C-ARM NO REPORT    Epidural Steroid injection    methylPREDNISolone acetate (DEPO-MEDROL) injection 80 mg      Plan: No additional findings.   Meds & Orders:  Meds ordered this encounter  Medications   methylPREDNISolone acetate (DEPO-MEDROL) injection 80 mg    Orders Placed This Encounter  Procedures   XR C-ARM NO REPORT   Epidural Steroid injection    Follow-up: Return for visit to requesting provider as needed.   Procedures: No procedures performed  Lumbar Epidural Steroid Injection - Interlaminar Approach with Fluoroscopic Guidance  Patient: Sydney [name]      Date of Birth: Aug 25, 1960 MRN: [id number] PCP: Raliegh Ip, DO      Visit Date: 06/24/2022   Universal Protocol:     Consent Given By: the patient  Position: PRONE  Additional Comments: Vital signs were monitored before and after the procedure. Patient was prepped and draped in the usual sterile fashion. The correct patient, procedure, and site was verified.   Injection Procedure Details:   Procedure diagnoses: Lumbar radiculopathy [M54.16]   Meds Administered:   Meds ordered this encounter  Medications   methylPREDNISolone acetate (DEPO-MEDROL) injection 80 mg     Laterality: Left  Location/Site:  L3-4  Needle: 3.5 in., 20 ga. Tuohy  Needle Placement: Paramedian epidural  Findings:   -Comments: Excellent flow of contrast into the epidural space.  Procedure Details: Using a paramedian approach from the side mentioned above, the region overlying the inferior lamina was localized under fluoroscopic visualization and the soft tissues overlying this structure were infiltrated with 4 ml. of 1% Lidocaine without Epinephrine. The Tuohy needle was inserted into the epidural space using a paramedian approach.   The epidural space was localized using loss of resistance along with counter oblique bi-planar fluoroscopic views.  After negative aspirate for air, blood, and CSF, a 2 ml. volume of Isovue-250 was injected into the epidural space and the flow of contrast was observed. Radiographs were obtained for documentation purposes.    The injectate was administered into the level noted above.   Additional Comments:  The patient tolerated the procedure well Dressing: 2 x 2 sterile gauze and Band-Aid    Post-procedure details: Patient was observed during the procedure. Post-procedure instructions were reviewed.  Patient left the clinic in stable condition.   Clinical History: EXAM: MRI LUMBAR SPINE WITHOUT CONTRAST   TECHNIQUE: Multiplanar, multisequence MR imaging of the lumbar spine was performed. No intravenous contrast was administered.   COMPARISON:  04/27/2020   FINDINGS: Segmentation:  5 lumbar-type vertebral bodies.   Alignment: Scoliosis, with dextroscoliosis of the lumbar spine. Trace retrolisthesis of T12 on L1 and L1 on  L2 and trace anterolisthesis of L5 on S1, unchanged.   Vertebrae: No acute fracture or suspicious osseous lesion. Endplate degenerative changes. 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   Conus medullaris and cauda equina: Conus extends to the L1 level. Conus and cauda equina appear normal.   Paraspinal and other soft tissues: Asymmetric fatty atrophy of the superior left psoas and inferior right-greater-than-left paraspinous muscles.   Disc levels:   T12-L1: Mild disc bulge. Mild facet arthropathy. No spinal canal stenosis or neural foraminal narrowing.   L1-L2: Mild left eccentric disc bulge. Left-greater-than-right facet arthropathy. Narrowing of the left lateral recess. No spinal canal stenosis. Mild left neural foraminal narrowing, which has progressed from the prior exam.   L2-L3: Mild disc bulge with left paracentral and subarticular protrusion which narrows the left lateral recess. Severe left and moderate right facet arthropathy. No spinal canal stenosis. Mild-to-moderate left neural foraminal narrowing which has progressed from the prior exam   L3-L4: Mild disc bulge with left foraminal and extreme lateral disc protrusion. Moderate facet arthropathy. Narrowing of the lateral recesses. No spinal canal stenosis. Moderate left neural foraminal narrowing, which has progressed from the prior exam.   L4-L5: Mild disc bulge with right foraminal and extreme lateral protrusion. Severe right and mild left facet arthropathy. Narrowing of the right lateral recess. No spinal canal stenosis. Mild right neural foraminal narrowing, unchanged.   L5-S1: Mild disc bulge. Moderate facet arthropathy. No spinal canal stenosis. Mild right neural foraminal narrowing, which has progressed from the prior exam.   IMPRESSION: 1. Progression of previously noted asymmetric degenerative changes with progressive left neural foraminal narrowing in the upper lumbar spine, which is now moderate at L3-L4, mild-to-moderate at L2-L3, and mild at L1-L2. Narrowing of the left lateral recesses at L1-L2 and L2-L3 and bilateral  lateral recesses L3-L4 could affect the descending left L2 and L3 nerve roots and bilateral L4 nerve roots, respectively. 2. Mild right neural foraminal narrowing at L4-L5 and L5-S1. Narrowing of the right lateral recess at L4-L5 could affect the descending right L5 nerve roots.     Electronically Signed   By: Wiliam Ke M.D.   On: 02/05/2022 13:58     Objective:  VS:  HT:    WT:   BMI:     BP:131/82  HR:68bpm  TEMP: ( )  RESP:  Physical Exam Vitals and nursing note reviewed.  Constitutional:      General: She is not in acute distress.    Appearance: Normal appearance. She is not ill-appearing.  HENT:     Head: Normocephalic and atraumatic.     Right Ear: External ear normal.     Left Ear: External ear normal.  Eyes:     Extraocular Movements: Extraocular movements intact.  Cardiovascular:     Rate and Rhythm: Normal rate.     Pulses: Normal pulses.  Pulmonary:     Effort: Pulmonary effort is normal. No respiratory distress.  Abdominal:     General: There is no distension.     Palpations: Abdomen is soft.  Musculoskeletal:        General: Tenderness present.     Cervical back: Neck supple.     Right lower leg: No edema.     Left lower leg: No edema.     Comments: Patient has good distal strength with no pain over the greater trochanters.  No clonus or focal weakness.  Skin:  Findings: No erythema, lesion or rash.  Neurological:     General: No focal deficit present.     Mental Status: She is alert and oriented to person, place, and time.     Sensory: No sensory deficit.     Motor: No weakness or abnormal muscle tone.     Coordination: Coordination normal.  Psychiatric:        Mood and Affect: Mood normal.        Behavior: Behavior normal.      Imaging: No results found.

## 2022-07-10 ENCOUNTER — Encounter: Payer: Self-pay | Admitting: Orthopaedic Surgery

## 2022-07-10 ENCOUNTER — Ambulatory Visit (INDEPENDENT_AMBULATORY_CARE_PROVIDER_SITE_OTHER): Payer: No Typology Code available for payment source | Admitting: Orthopaedic Surgery

## 2022-07-10 VITALS — Ht 65.0 in | Wt 230.0 lb

## 2022-07-10 DIAGNOSIS — M5416 Radiculopathy, lumbar region: Secondary | ICD-10-CM

## 2022-07-10 DIAGNOSIS — M47816 Spondylosis without myelopathy or radiculopathy, lumbar region: Secondary | ICD-10-CM

## 2022-07-10 NOTE — Progress Notes (Signed)
Office Visit Note   Patient: Sydney [name]           Date of Birth: 06/27/1961           MRN: [id number] Visit Date: 07/10/2022              Requested by: Raliegh Ip, DO [address] [place],  [place] [address] PCP: Raliegh Ip, DO   Assessment & Plan: Visit Diagnoses:  1. Facet degeneration of lumbar region   2. Lumbar radiculopathy     Plan: Patient got good relief interlaminar injection for a week and a half states it seems like it is beginning to wear off.  She can call back mid January .  Follow-Up Instructions: No follow-ups on file.   Orders:  No orders of the defined types were placed in this encounter.  No orders of the defined types were placed in this encounter.     Procedures: No procedures performed   Clinical Data: No additional findings.   Subjective: Chief Complaint  Patient presents with   Right Shoulder - Pain   Lower Back - Pain    HPI patient returns she had interlaminar L3-4 injection with Dr. Alvester Morin and states she got great relief that lasted completely for a week and a half and now some with coming back.  She is ambulating with a cane for balance.  She has had total of arthroplasty procedures.  Having some discomfort in her right shoulder from having to use the cane.  She can call after the holidays let us know how she is doing.  Review of Systems updated unchanged.   Objective: Vital Signs: Ht 5\' 5"  (1.651 m)   Wt 230 lb (104.3 kg)   BMI 38.27 kg/m   Physical Exam Constitutional:      Appearance: She is well-developed.  HENT:     Head: Normocephalic.     Right Ear: External ear normal.     Left Ear: External ear normal. There is no impacted cerumen.  Eyes:     Pupils: Pupils are equal, round, and reactive to light.  Neck:     Thyroid: No thyromegaly.     Trachea: No tracheal deviation.  Cardiovascular:     Rate and Rhythm: Normal rate.  Pulmonary:     Effort: Pulmonary effort is normal.  Abdominal:      Palpations: Abdomen is soft.  Musculoskeletal:     Cervical back: No rigidity.  Skin:    General: Skin is warm and dry.  Neurological:     Mental Status: She is alert and oriented to person, place, and time.  Psychiatric:        Behavior: Behavior normal.     Ortho Exam patient is amatory with a cane no pain with hip range of motion.  Discomfort with lumbar extension and flexion.  Specialty Comments:  EXAM: MRI LUMBAR SPINE WITHOUT CONTRAST   TECHNIQUE: Multiplanar, multisequence MR imaging of the lumbar spine was performed. No intravenous contrast was administered.   COMPARISON:  04/27/2020   FINDINGS: Segmentation:  5 lumbar-type vertebral bodies.   Alignment: Scoliosis, with dextroscoliosis of the lumbar spine. Trace retrolisthesis of T12 on L1 and L1 on L2 and trace anterolisthesis of L5 on S1, unchanged.   Vertebrae: No acute fracture or suspicious osseous lesion. Endplate degenerative changes. 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   Conus medullaris and cauda equina: Conus extends to  the L1 level. Conus and cauda equina appear normal.   Paraspinal and other soft tissues: Asymmetric fatty atrophy of the superior left psoas and inferior right-greater-than-left paraspinous muscles.   Disc levels:   T12-L1: Mild disc bulge. Mild facet arthropathy. No spinal canal stenosis or neural foraminal narrowing.   L1-L2: Mild left eccentric disc bulge. Left-greater-than-right facet arthropathy. Narrowing of the left lateral recess. No spinal canal stenosis. Mild left neural foraminal narrowing, which has progressed from the prior exam.   L2-L3: Mild disc bulge with left paracentral and subarticular protrusion which narrows the left lateral recess. Severe left and moderate right facet arthropathy. No spinal canal stenosis. Mild-to-moderate left neural foraminal narrowing which has progressed  from the prior exam   L3-L4: Mild disc bulge with left foraminal and extreme lateral disc protrusion. Moderate facet arthropathy. Narrowing of the lateral recesses. No spinal canal stenosis. Moderate left neural foraminal narrowing, which has progressed from the prior exam.   L4-L5: Mild disc bulge with right foraminal and extreme lateral protrusion. Severe right and mild left facet arthropathy. Narrowing of the right lateral recess. No spinal canal stenosis. Mild right neural foraminal narrowing, unchanged.   L5-S1: Mild disc bulge. Moderate facet arthropathy. No spinal canal stenosis. Mild right neural foraminal narrowing, which has progressed from the prior exam.   IMPRESSION: 1. Progression of previously noted asymmetric degenerative changes with progressive left neural foraminal narrowing in the upper lumbar spine, which is now moderate at L3-L4, mild-to-moderate at L2-L3, and mild at L1-L2. Narrowing of the left lateral recesses at L1-L2 and L2-L3 and bilateral lateral recesses L3-L4 could affect the descending left L2 and L3 nerve roots and bilateral L4 nerve roots, respectively. 2. Mild right neural foraminal narrowing at L4-L5 and L5-S1. Narrowing of the right lateral recess at L4-L5 could affect the descending right L5 nerve roots.     Electronically Signed   By: Wiliam Ke M.D.   On: 02/05/2022 13:58  Imaging: No results found.   PMFS History: Patient Active Problem List   Diagnosis Date Noted   Facet degeneration of lumbar region 02/13/2022   Peripheral vascular disease with stasis dermatitis 09/03/2021   Urine frequency 05/10/2021   Cellulitis of left leg 01/18/2021   Rash 01/18/2021   History of bilateral hip arthroplasty 01/03/2021   Upper respiratory tract infection 12/30/2020   Non-recurrent acute serous otitis media of right ear 12/30/2020   Pharyngitis 10/29/2020   Trochanteric bursitis, left hip 09/13/2020   Closed fracture of multiple pubic  rami, left, sequela 08/16/2020   H/O adenomatous polyp of colon 05/23/2020   FH: colon cancer in relative diagnosed at >91 years old 05/23/2020   Spinal stenosis of lumbar region 05/03/2020   Impingement syndrome of right shoulder 05/03/2020   H/O total hip arthroplasty 12/06/2019   Status post total replacement of left hip 09/15/2019   Back pain 07/01/2018   DDD (degenerative disc disease), lumbosacral 07/01/2018   Venous insufficiency of both lower extremities 11/17/2016   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 level with radiculopathy 08/18/2016   Morbid obesity with BMI of 40.0-44.9, adult (HCC) 07/28/2016   Chronic pain syndrome 10/24/2015   Dysfunction of both eustachian tubes 07/26/2014   Calculus of left kidney 04/25/2014   Impaired fasting glucose 04/05/2014   History of bladder surgery 04/03/2014   History of hysterectomy 04/03/2014   Urge incontinence 04/03/2014   Asthma 07/28/2013   Nontoxic uninodular goiter 07/28/2013   Solitary pulmonary nodule 07/28/2013   Lumbar radiculopathy 04/20/2013  Insomnia 03/22/2013   Essential hypertension 08/18/2011   Tobacco use disorder 08/18/2011   Moderate episode of recurrent major depressive disorder (HCC) 04/03/2011   Other B-complex deficiencies 08/16/2010   Family history of cerebrovascular accident (CVA) 04/17/2010   Family history of malignant neoplasm of gastrointestinal tract 04/17/2010   Family history of other cardiovascular diseases(V17.49) 04/17/2010   Past Medical History:  Diagnosis Date   Allergy    Anemia    history of   Anxiety    Arthritis    Asthma    Atherosclerosis    Depression    Family history of adverse reaction to anesthesia    pt's mother felt everything and couldn't speak during a gallbladder surgery   GERD (gastroesophageal reflux disease)    History of hiatal hernia    History of kidney stones    Hypertension    Pneumonia    a few times   TIA (transient ischemic attack) 2012    Family History   Problem Relation Age of Onset   Anxiety disorder Mother    Depression Mother    Heart disease Mother    Hypertension Mother    Arrhythmia Mother    Cancer Father        colon, age greater than 73   Lung cancer Father    Migraines Sister    Cancer Daughter        nerve sheath sarcoma   Alcohol abuse Brother    Breast cancer Neg Hx     Past Surgical History:  Procedure Laterality Date   ABDOMINAL HYSTERECTOMY     abdominal   APPENDECTOMY     bladder tack     CARPAL TUNNEL RELEASE Bilateral    CERVICAL SPINE SURGERY     5-6, 6-7   CHOLECYSTECTOMY     COLONOSCOPY WITH PROPOFOL N/A 07/10/2020   Procedure: COLONOSCOPY WITH PROPOFOL;  Surgeon: Lanelle Bal, DO;  Location: [facility];  Service: Endoscopy;  Laterality: N/A;  12:15pm   ENDOVENOUS ABLATION SAPHENOUS VEIN W/ LASER Left 11/28/2021   endovenous laser ablation left greater saphenous vein by Coral Else MD   HAMMER TOE SURGERY Right    JOINT REPLACEMENT     TOTAL HIP 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   Procedure: LEFT TOTAL HIP ARTHROPLASTY -DIRECT ANTERIOR;  Surgeon: Eldred Manges, MD;  Location: [facility] OR;  Service: Orthopedics;  Laterality: Left;   TOTAL HIP ARTHROPLASTY Right 12/05/2019   Procedure: RIGHT TOTAL HIP ARTHROPLASTY ANTERIOR APPROACH  DIRECT ANTERIOR;  Surgeon: Eldred Manges, MD;  Location: [facility] OR;  Service: Orthopedics;  Laterality: Right;   Social History   Occupational History   Not on file  Tobacco Use   Smoking status: Every Day    Packs/day: 0.50    Years: 23.00    Total pack years: 11.50    Types: Cigarettes   Smokeless tobacco: Never  Vaping Use   Vaping Use: Never used  Substance and Sexual Activity   Alcohol use: Yes    Comment: occ   Drug use: Never   Sexual activity: Not Currently

## 2022-07-22 ENCOUNTER — Encounter: Payer: Self-pay | Admitting: Family Medicine

## 2022-07-22 ENCOUNTER — Telehealth (INDEPENDENT_AMBULATORY_CARE_PROVIDER_SITE_OTHER): Payer: 59 | Admitting: Family Medicine

## 2022-07-22 DIAGNOSIS — G8929 Other chronic pain: Secondary | ICD-10-CM

## 2022-07-22 DIAGNOSIS — M5442 Lumbago with sciatica, left side: Secondary | ICD-10-CM

## 2022-07-22 DIAGNOSIS — M25552 Pain in left hip: Secondary | ICD-10-CM

## 2022-07-22 DIAGNOSIS — M5137 Other intervertebral disc degeneration, lumbosacral region: Secondary | ICD-10-CM | POA: Diagnosis not present

## 2022-07-22 MED ORDER — HYDROCODONE-ACETAMINOPHEN 5-325 MG PO TABS: 1.0000 | ORAL_TABLET | ORAL | 0 refills | Status: DC | PRN

## 2022-07-22 NOTE — Progress Notes (Signed)
MyChart Video visit  Subjective: Sydney [name] PCP: Sydney Norlander, DO IOE:VOJJ Casavant is a 62 y.o. female. Patient provides verbal consent for consult held via video.  Due to COVID-19 pandemic this visit was conducted virtually. This visit type was conducted due to national recommendations for restrictions regarding the COVID-19 Pandemic (e.g. social distancing, sheltering in place) in an effort to limit this patient's exposure and mitigate transmission in our community. All issues noted in this document were discussed and addressed.  A physical exam was not performed with this format.   Location of patient: home Location of provider: [facility] Others present for call: none  1. Chronic back [name] Had spinal injection and she notes that the shot was wearing off within a week in a half.  She notes that [name] is pretty much back to where she was pre-shot.  She will be getting her next shot around 08/04/2022.  She has only 2 norco left and going on vacation so wants to have a supply just in case.  No constipation, falls, dizziness. Unsteady on her feet due to left leg radicular symptoms and back [name] but is not sedated from meds.   ROS: Per HPI  Allergies  Allergen Reactions   Bee Pollen Anaphylaxis and Swelling   Bee Venom Anaphylaxis and Swelling   Butorphanol Other (See Comments)    Not sure told by md she was allergic after surgery.    Meloxicam Anxiety    Mood disorder Altered her personality    Penicillins Anaphylaxis and Hives    Unable to Recall As child mom was told she is highly allergic Did it involve swelling of the face/tongue/throat, SOB, or low BP? Unknown Did it involve sudden or severe rash/hives, skin peeling, or any reaction on the inside of your mouth or nose? Unknown Did you need to seek medical attention at a hospital or doctor's office? Unknown When did it last happen?      childhood allergy If all above answers are "NO", may proceed with cephalosporin use.     Prochlorperazine Edisylate Anaphylaxis   Sulfa Antibiotics Anaphylaxis    Unable to Recall   Tramadol Anxiety    Didn't like the way it made her feel    Clindamycin Hives   Levaquin [Levofloxacin] Itching   Topiramate Nausea And Vomiting   Doxycycline Dermatitis, Hives, Itching, Photosensitivity and Rash   Past Medical History:  Diagnosis Date   Allergy    Anemia    history of   Anxiety    Arthritis    Asthma    Atherosclerosis    Depression    Family history of adverse reaction to anesthesia    pt's mother felt everything and couldn't speak during a gallbladder surgery   GERD (gastroesophageal reflux disease)    History of hiatal hernia    History of kidney stones    Hypertension    Pneumonia    a few times   TIA (transient ischemic attack) 2012    Current Outpatient Medications:    albuterol (VENTOLIN HFA) 108 (90 Base) MCG/ACT inhaler, Inhale 2 puffs into the lungs every 6 (six) hours as needed for wheezing or shortness of breath., Disp: , Rfl:    amLODipine (NORVASC) 5 MG tablet, Take 1 tablet (5 mg total) by mouth daily., Disp: 90 tablet, Rfl: 3   cetirizine (ZYRTEC) 10 MG tablet, Take 10 mg by mouth daily., Disp: , Rfl:    cyclobenzaprine (FLEXERIL) 10 MG tablet, TAKE 1/2 TO 1 TABLET BY MOUTH  3 TIMES A DAY AS NEEDED FOR MUSCLE SPASMS, Disp: 90 tablet, Rfl: 1   EPINEPHrine (EPIPEN 2-PAK) 0.3 mg/0.3 mL IJ SOAJ injection, Inject 0.3 mg into the muscle as needed for anaphylaxis., Disp: 2 each, Rfl: 0   HYDROcodone-acetaminophen (NORCO/VICODIN) 5-325 MG tablet, Take 1 tablet by mouth every 4 (four) hours as needed for severe [name] (put on file)., Disp: 30 tablet, Rfl: 0   metoprolol succinate (TOPROL-XL) 25 MG 24 hr tablet, Take 1 tablet (25 mg total) by mouth daily., Disp: 90 tablet, Rfl: 3   rosuvastatin (CRESTOR) 10 MG tablet, Take 1 tablet (10 mg total) by mouth every other day., Disp: 45 tablet, Rfl: 3   sertraline (ZOLOFT) 100 MG tablet, Take 1.5 tablets (150 mg  total) by mouth daily., Disp: 90 tablet, Rfl: 3   traZODone (DESYREL) 100 MG tablet, TAKE 1 TO 2 TABLETS AT     BEDTIME AS NEEDED FOR SLEEP, Disp: 180 tablet, Rfl: 3  Gen: nontoxic female. NAD Pulm: normal work of breathing on room air. Psych: mood stable, speech normal. Neuro: alert and oriented.  Assessment/ Plan: 62 y.o. 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  Keep ortho appt 08/04/2022.  UTD on UDS and CSC.  The Narcotic Database has been reviewed.  There were no red flags.     Start time: 3:16pm End time: 3:24pm  Total time spent on patient care (including video visit/ documentation): 8 minutes  [place], [place] [phone]

## 2022-08-20 ENCOUNTER — Telehealth: Payer: Self-pay | Admitting: Radiology

## 2022-08-20 DIAGNOSIS — M5416 Radiculopathy, lumbar region: Secondary | ICD-10-CM

## 2022-08-20 DIAGNOSIS — M47816 Spondylosis without myelopathy or radiculopathy, lumbar region: Secondary | ICD-10-CM

## 2022-08-20 NOTE — Addendum Note (Signed)
Addended by: Meyer Cory on: 08/20/2022 03:37 PM   Modules accepted: Orders

## 2022-08-20 NOTE — Telephone Encounter (Signed)
Patient called stating that she is having continued pain that is now radiating further down the left leg. She had previous lumbar ESI on 06/24/2022. Injection gave her 100% relief for a few days, then started to wear off. Noticed improvement for only about a week and a half before pain was back. Over the past couple of days, increasing pain down the left leg which is radiating further down than before. She is using her cane and notes that she would have had a really difficult time without it over the past couple of days. She has had to take hydrocodone and muscle relaxer for the past 2 nights. She did not know if there is a possibility of repeat injection?  She feels that she has 2 separate issues and that she may have torn something that is just not healing after incident in June. She is traveling to see her daughter in [place] tomorrow.  Please advise. Repeat injection? Any other recommendations?  CB [phone]

## 2022-08-20 NOTE — Telephone Encounter (Signed)
Referral entered. I called patient and advised.

## 2022-08-28 ENCOUNTER — Ambulatory Visit (INDEPENDENT_AMBULATORY_CARE_PROVIDER_SITE_OTHER): Payer: 59 | Admitting: Physical Medicine and Rehabilitation

## 2022-08-28 ENCOUNTER — Ambulatory Visit: Payer: Self-pay

## 2022-08-28 VITALS — BP 136/84 | HR 90

## 2022-08-28 DIAGNOSIS — M5416 Radiculopathy, lumbar region: Secondary | ICD-10-CM | POA: Diagnosis not present

## 2022-08-28 MED ORDER — METHYLPREDNISOLONE ACETATE 80 MG/ML IJ SUSP
80.0000 mg | Freq: Once | INTRAMUSCULAR | Status: AC
  Administered 2022-08-28: 80 mg

## 2022-08-28 NOTE — Patient Instructions (Signed)

## 2022-08-28 NOTE — Progress Notes (Signed)
Functional Pain Scale - descriptive words and definitions   Severe (9)  Cannot do any ADL's even with assistance can barely talk/unable to sleep and unable to use distraction. Severe range order  Average Pain 8-9   +Driver, -BT, -Dye Allergies.  Lower back pain on the left that can radiate down the left leg to the calf

## 2022-09-10 NOTE — Procedures (Signed)
Lumbar Epidural Steroid Injection - Interlaminar Approach with Fluoroscopic Guidance  Patient: Sydney [name]      Date of Birth: [birth year] MRN: [id number] PCP: Janora Norlander, DO      Visit Date: 08/28/2022   Universal Protocol:     Consent Given By: the patient  Position: PRONE  Additional Comments: Vital signs were monitored before and after the procedure. Patient was prepped and draped in the usual sterile fashion. The correct patient, procedure, and site was verified.   Injection Procedure Details:   Procedure diagnoses: Lumbar radiculopathy [M54.16]   Meds Administered:  Meds ordered this encounter  Medications   methylPREDNISolone acetate (DEPO-MEDROL) injection 80 mg     Laterality: Left  Location/Site:  L3-4  Needle: 3.5 in., 20 ga. Tuohy  Needle Placement: Paramedian epidural  Findings:   -Comments: Excellent flow of contrast into the epidural space.  Procedure Details: Using a paramedian approach from the side mentioned above, the region overlying the inferior lamina was localized under fluoroscopic visualization and the soft tissues overlying this structure were infiltrated with 4 ml. of 1% Lidocaine without Epinephrine. The Tuohy needle was inserted into the epidural space using a paramedian approach.   The epidural space was localized using loss of resistance along with counter oblique bi-planar fluoroscopic views.  After negative aspirate for air, blood, and CSF, a 2 ml. volume of Isovue-250 was injected into the epidural space and the flow of contrast was observed. Radiographs were obtained for documentation purposes.    The injectate was administered into the level noted above.   Additional Comments:  No complications occurred Dressing: 2 x 2 sterile gauze and Band-Aid    Post-procedure details: Patient was observed during the procedure. Post-procedure instructions were reviewed.  Patient left the clinic in stable condition.

## 2022-09-10 NOTE — Progress Notes (Signed)
Sydney [name] - 62 y.o. female MRN [id number]  Date of birth: [birth year]  Office Visit Note: Visit Date: 08/28/2022 PCP: Janora Norlander, DO Referred by: Janora Norlander, DO  Subjective: Chief Complaint  Patient presents with   Lower Back - Pain   HPI:  Sydney [name] is a 62 y.o. female who comes in today at the request of Dr. Rodell Perna for planned Left L3-4 Lumbar Interlaminar epidural steroid injection with fluoroscopic guidance.  The patient has failed conservative care including home exercise, medications, time and activity modification.  This injection will be diagnostic and hopefully therapeutic.  Please see requesting physician notes for further details and justification.   ROS Otherwise per HPI.  Assessment & Plan: Visit Diagnoses:    ICD-10-CM   1. Lumbar radiculopathy  M54.16 XR C-ARM NO REPORT    Epidural Steroid injection    methylPREDNISolone acetate (DEPO-MEDROL) injection 80 mg      Plan: No additional findings.   Meds & Orders:  Meds ordered this encounter  Medications   methylPREDNISolone acetate (DEPO-MEDROL) injection 80 mg    Orders Placed This Encounter  Procedures   XR C-ARM NO REPORT   Epidural Steroid injection    Follow-up: Return for visit to requesting provider as needed.   Procedures: No procedures performed  Lumbar Epidural Steroid Injection - Interlaminar Approach with Fluoroscopic Guidance  Patient: Sydney [name]      Date of Birth: Jul 14, 1961 MRN: [id number] PCP: Janora Norlander, DO      Visit Date: 08/28/2022   Universal Protocol:     Consent Given By: the patient  Position: PRONE  Additional Comments: Vital signs were monitored before and after the procedure. Patient was prepped and draped in the usual sterile fashion. The correct patient, procedure, and site was verified.   Injection Procedure Details:   Procedure diagnoses: Lumbar radiculopathy [M54.16]   Meds Administered:  Meds ordered this encounter   Medications   methylPREDNISolone acetate (DEPO-MEDROL) injection 80 mg     Laterality: Left  Location/Site:  L3-4  Needle: 3.5 in., 20 ga. Tuohy  Needle Placement: Paramedian epidural  Findings:   -Comments: Excellent flow of contrast into the epidural space.  Procedure Details: Using a paramedian approach from the side mentioned above, the region overlying the inferior lamina was localized under fluoroscopic visualization and the soft tissues overlying this structure were infiltrated with 4 ml. of 1% Lidocaine without Epinephrine. The Tuohy needle was inserted into the epidural space using a paramedian approach.   The epidural space was localized using loss of resistance along with counter oblique bi-planar fluoroscopic views.  After negative aspirate for air, blood, and CSF, a 2 ml. volume of Isovue-250 was injected into the epidural space and the flow of contrast was observed. Radiographs were obtained for documentation purposes.    The injectate was administered into the level noted above.   Additional Comments:  No complications occurred Dressing: 2 x 2 sterile gauze and Band-Aid    Post-procedure details: Patient was observed during the procedure. Post-procedure instructions were reviewed.  Patient left the clinic in stable condition.   Clinical History: EXAM: MRI LUMBAR SPINE WITHOUT CONTRAST   TECHNIQUE: Multiplanar, multisequence MR imaging of the lumbar spine was performed. No intravenous contrast was administered.   COMPARISON:  04/27/2020   FINDINGS: Segmentation:  5 lumbar-type vertebral bodies.   Alignment: Scoliosis, with dextroscoliosis of the lumbar spine. Trace retrolisthesis of T12 on L1 and L1 on L2 and trace anterolisthesis of L5 on  S1, unchanged.   Vertebrae: No acute fracture or suspicious osseous lesion. Endplate degenerative changes. 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   Conus medullaris and cauda equina: Conus extends to the L1 level. Conus and cauda equina appear normal.   Paraspinal and other soft tissues: Asymmetric fatty atrophy of the superior left psoas and inferior right-greater-than-left paraspinous muscles.   Disc levels:   T12-L1: Mild disc bulge. Mild facet arthropathy. No spinal canal stenosis or neural foraminal narrowing.   L1-L2: Mild left eccentric disc bulge. Left-greater-than-right facet arthropathy. Narrowing of the left lateral recess. No spinal canal stenosis. Mild left neural foraminal narrowing, which has progressed from the prior exam.   L2-L3: Mild disc bulge with left paracentral and subarticular protrusion which narrows the left lateral recess. Severe left and moderate right facet arthropathy. No spinal canal stenosis. Mild-to-moderate left neural foraminal narrowing which has progressed from the prior exam   L3-L4: Mild disc bulge with left foraminal and extreme lateral disc protrusion. Moderate facet arthropathy. Narrowing of the lateral recesses. No spinal canal stenosis. Moderate left neural foraminal narrowing, which has progressed from the prior exam.   L4-L5: Mild disc bulge with right foraminal and extreme lateral protrusion. Severe right and mild left facet arthropathy. Narrowing of the right lateral recess. No spinal canal stenosis. Mild right neural foraminal narrowing, unchanged.   L5-S1: Mild disc bulge. Moderate facet arthropathy. No spinal canal stenosis. Mild right neural foraminal narrowing, which has progressed from the prior exam.   IMPRESSION: 1. Progression of previously noted asymmetric degenerative changes with progressive left neural foraminal narrowing in the upper lumbar spine, which is now moderate at L3-L4, mild-to-moderate at L2-L3, and mild at L1-L2. Narrowing of the left lateral recesses at L1-L2 and L2-L3 and bilateral lateral recesses L3-L4 could affect  the descending left L2 and L3 nerve roots and bilateral L4 nerve roots, respectively. 2. Mild right neural foraminal narrowing at L4-L5 and L5-S1. Narrowing of the right lateral recess at L4-L5 could affect the descending right L5 nerve roots.     Electronically Signed   By: Merilyn Baba M.D.   On: 02/05/2022 13:58     Objective:  VS:  HT:    WT:   BMI:     BP:136/84  HR:90bpm  TEMP: ( )  RESP:  Physical Exam Vitals and nursing note reviewed.  Constitutional:      General: She is not in acute distress.    Appearance: Normal appearance. She is obese. She is not ill-appearing.  HENT:     Head: Normocephalic and atraumatic.     Right Ear: External ear normal.     Left Ear: External ear normal.  Eyes:     Extraocular Movements: Extraocular movements intact.  Cardiovascular:     Rate and Rhythm: Normal rate.     Pulses: Normal pulses.  Pulmonary:     Effort: Pulmonary effort is normal. No respiratory distress.  Abdominal:     General: There is no distension.     Palpations: Abdomen is soft.  Musculoskeletal:        General: Tenderness present.     Cervical back: Neck supple.     Right lower leg: No edema.     Left lower leg: No edema.     Comments: Patient has good distal strength with no pain over the greater trochanters.  No clonus or focal weakness.  Skin:    Findings: No  erythema, lesion or rash.  Neurological:     General: No focal deficit present.     Mental Status: She is alert and oriented to person, place, and time.     Sensory: No sensory deficit.     Motor: No weakness or abnormal muscle tone.     Coordination: Coordination normal.  Psychiatric:        Mood and Affect: Mood normal.        Behavior: Behavior normal.      Imaging: No results found.

## 2022-09-17 ENCOUNTER — Encounter: Payer: Self-pay | Admitting: Orthopaedic Surgery

## 2022-09-17 ENCOUNTER — Ambulatory Visit: Payer: 59 | Admitting: Orthopaedic Surgery

## 2022-09-17 VITALS — BP 151/90 | HR 77 | Ht 65.0 in | Wt 230.0 lb

## 2022-09-17 DIAGNOSIS — M5416 Radiculopathy, lumbar region: Secondary | ICD-10-CM | POA: Diagnosis not present

## 2022-09-17 DIAGNOSIS — M47816 Spondylosis without myelopathy or radiculopathy, lumbar region: Secondary | ICD-10-CM

## 2022-09-17 MED ORDER — GABAPENTIN 100 MG PO CAPS: 100.0000 mg | ORAL_CAPSULE | Freq: Three times a day (TID) | ORAL | 1 refills | Status: DC

## 2022-09-17 NOTE — Progress Notes (Unsigned)
Office Visit Note   Patient: Sydney [name]           Date of Birth: 10-03-1960           MRN: [id number] Visit Date: 09/17/2022              Requested by: Janora Norlander, DO [place],  [place] [id number] PCP: Janora Norlander, DO   Assessment & Plan: Visit Diagnoses: No diagnosis found.  Plan: ***  Follow-Up Instructions: No follow-ups on file.   Orders:  No orders of the defined types were placed in this encounter.  No orders of the defined types were placed in this encounter.     Procedures: No procedures performed   Clinical Data: No additional findings.   Subjective: Chief Complaint  Patient presents with   Lower Back - Pain    HPI  Review of Systems   Objective: Vital Signs: BP (!) 151/90   Pulse 77   Ht '5\' 5"'$  (1.651 m)   Wt 230 lb (104.3 kg)   BMI 38.27 kg/m   Physical Exam  Ortho Exam  Specialty Comments:  EXAM: MRI LUMBAR SPINE WITHOUT CONTRAST   TECHNIQUE: Multiplanar, multisequence MR imaging of the lumbar spine was performed. No intravenous contrast was administered.   COMPARISON:  04/27/2020   FINDINGS: Segmentation:  5 lumbar-type vertebral bodies.   Alignment: Scoliosis, with dextroscoliosis of the lumbar spine. Trace retrolisthesis of T12 on L1 and L1 on L2 and trace anterolisthesis of L5 on S1, unchanged.   Vertebrae: No acute fracture or suspicious osseous lesion. Endplate degenerative changes. 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   Conus medullaris and cauda equina: Conus extends to the L1 level. Conus and cauda equina appear normal.   Paraspinal and other soft tissues: Asymmetric fatty atrophy of the superior left psoas and inferior right-greater-than-left paraspinous muscles.   Disc levels:   T12-L1: Mild disc bulge. Mild facet arthropathy. No spinal canal stenosis or neural foraminal narrowing.   L1-L2: Mild left  eccentric disc bulge. Left-greater-than-right facet arthropathy. Narrowing of the left lateral recess. No spinal canal stenosis. Mild left neural foraminal narrowing, which has progressed from the prior exam.   L2-L3: Mild disc bulge with left paracentral and subarticular protrusion which narrows the left lateral recess. Severe left and moderate right facet arthropathy. No spinal canal stenosis. Mild-to-moderate left neural foraminal narrowing which has progressed from the prior exam   L3-L4: Mild disc bulge with left foraminal and extreme lateral disc protrusion. Moderate facet arthropathy. Narrowing of the lateral recesses. No spinal canal stenosis. Moderate left neural foraminal narrowing, which has progressed from the prior exam.   L4-L5: Mild disc bulge with right foraminal and extreme lateral protrusion. Severe right and mild left facet arthropathy. Narrowing of the right lateral recess. No spinal canal stenosis. Mild right neural foraminal narrowing, unchanged.   L5-S1: Mild disc bulge. Moderate facet arthropathy. No spinal canal stenosis. Mild right neural foraminal narrowing, which has progressed from the prior exam.   IMPRESSION: 1. Progression of previously noted asymmetric degenerative changes with progressive left neural foraminal narrowing in the upper lumbar spine, which is now moderate at L3-L4, mild-to-moderate at L2-L3, and mild at L1-L2. Narrowing of the left lateral recesses at L1-L2 and L2-L3 and bilateral lateral recesses L3-L4 could affect the descending left L2 and L3 nerve roots and bilateral L4 nerve roots, respectively. 2. Mild right neural foraminal narrowing  at L4-L5 and L5-S1. Narrowing of the right lateral recess at L4-L5 could affect the descending right L5 nerve roots.     Electronically Signed   By: Merilyn Baba M.D.   On: 02/05/2022 13:58  Imaging: No results found.   PMFS History: Patient Active Problem List   Diagnosis Date Noted    Facet degeneration of lumbar region 02/13/2022   Peripheral vascular disease with stasis dermatitis 09/03/2021   Urine frequency 05/10/2021   Cellulitis of left leg 01/18/2021   Rash 01/18/2021   History of bilateral hip arthroplasty 01/03/2021   Upper respiratory tract infection 12/30/2020   Non-recurrent acute serous otitis media of right ear 12/30/2020   Pharyngitis 10/29/2020   Trochanteric bursitis, left hip 09/13/2020   Closed fracture of multiple pubic rami, left, sequela 08/16/2020   H/O adenomatous polyp of colon 05/23/2020   FH: colon cancer in relative diagnosed at >34 years old 05/23/2020   Spinal stenosis of lumbar region 05/03/2020   Impingement syndrome of right shoulder 05/03/2020   H/O total hip arthroplasty 12/06/2019   Status post total replacement of left hip 09/15/2019   Back pain 07/01/2018   DDD (degenerative disc disease), lumbosacral 07/01/2018   Venous insufficiency of both lower extremities 11/17/2016   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 level with radiculopathy 08/18/2016   Morbid obesity with BMI of 40.0-44.9, adult ([place]) 07/28/2016   Chronic pain syndrome 10/24/2015   Dysfunction of both eustachian tubes 07/26/2014   Calculus of left kidney 04/25/2014   Impaired fasting glucose 04/05/2014   History of bladder surgery 04/03/2014   History of hysterectomy 04/03/2014   Urge incontinence 04/03/2014   Asthma 07/28/2013   Nontoxic uninodular goiter 07/28/2013   Solitary pulmonary nodule 07/28/2013   Lumbar radiculopathy 04/20/2013   Insomnia 03/22/2013   Essential hypertension 08/18/2011   Tobacco use disorder 08/18/2011   Moderate episode of recurrent major depressive disorder ([place]) 04/03/2011   Other B-complex deficiencies 08/16/2010   Family history of cerebrovascular accident (CVA) 04/17/2010   Family history of malignant neoplasm of gastrointestinal tract 04/17/2010   Family history of other cardiovascular diseases(V17.49) 04/17/2010   Past  Medical History:  Diagnosis Date   Allergy    Anemia    history of   Anxiety    Arthritis    Asthma    Atherosclerosis    Depression    Family history of adverse reaction to anesthesia    pt's mother felt everything and couldn't speak during a gallbladder surgery   GERD (gastroesophageal reflux disease)    History of hiatal hernia    History of kidney stones    Hypertension    Pneumonia    a few times   TIA (transient ischemic attack) 2012    Family History  Problem Relation Age of Onset   Anxiety disorder Mother    Depression Mother    Heart disease Mother    Hypertension Mother    Arrhythmia Mother    Cancer Father        colon, age greater than 83   Lung cancer Father    Migraines Sister    Cancer Daughter        nerve sheath sarcoma   Alcohol abuse Brother    Breast cancer Neg Hx     Past Surgical History:  Procedure Laterality Date   ABDOMINAL HYSTERECTOMY     abdominal   APPENDECTOMY     bladder tack     CARPAL TUNNEL RELEASE Bilateral    CERVICAL  SPINE SURGERY     5-6, 6-7   CHOLECYSTECTOMY     COLONOSCOPY WITH PROPOFOL N/A 07/10/2020   Procedure: COLONOSCOPY WITH PROPOFOL;  Surgeon: Eloise Harman, DO;  Location: [facility];  Service: Endoscopy;  Laterality: N/A;  12:15pm   ENDOVENOUS ABLATION SAPHENOUS VEIN W/ LASER Left 11/28/2021   endovenous laser ablation left greater saphenous vein by Harold Barban MD   HAMMER TOE SURGERY Right    JOINT REPLACEMENT     TOTAL HIP 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   Procedure: LEFT TOTAL HIP ARTHROPLASTY -DIRECT ANTERIOR;  Surgeon: Marybelle Killings, MD;  Location: [facility];  Service: Orthopedics;  Laterality: Left;   TOTAL HIP ARTHROPLASTY Right 12/05/2019   Procedure: RIGHT TOTAL HIP ARTHROPLASTY ANTERIOR APPROACH  DIRECT ANTERIOR;  Surgeon: Marybelle Killings, MD;  Location: [facility];  Service: Orthopedics;  Laterality: Right;   Social History   Occupational History   Not on file  Tobacco Use   Smoking status: Every Day     Packs/day: 0.50    Years: 23.00    Total pack years: 11.50    Types: Cigarettes   Smokeless tobacco: Never  Vaping Use   Vaping Use: Never used  Substance and Sexual Activity   Alcohol use: Yes    Comment: occ   Drug use: Never   Sexual activity: Not Currently

## 2022-09-20 IMAGING — MR MR LUMBAR SPINE W/O CM
5 of 6 series · 33 of 48 positions shown · non-contrast
Comparison: 06/07/2014

CLINICAL DATA: Chronic low back pain with right leg pain and
numbness.

EXAM:
MRI LUMBAR SPINE WITHOUT CONTRAST
TECHNIQUE: Multiplanar, multisequence MR imaging of the lumbar spine was
performed. No intravenous contrast was administered.

[Series 5: T2 · sagittal · 4.0mm · 0.68mm/px · 5 of 15 slices shown (1 of 3)]
[im 1/15]
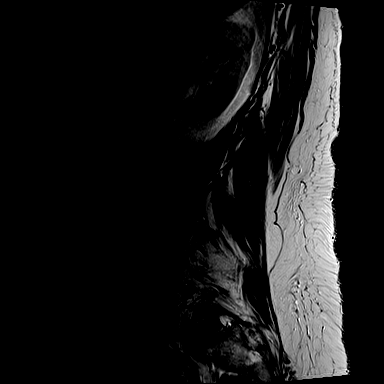
[im 4/15]
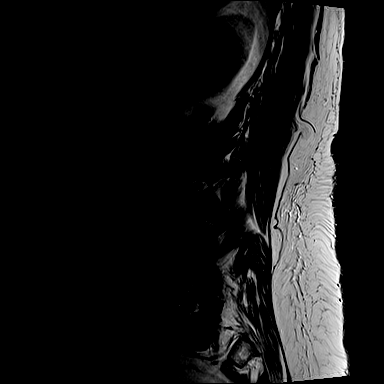
[im 8/15]
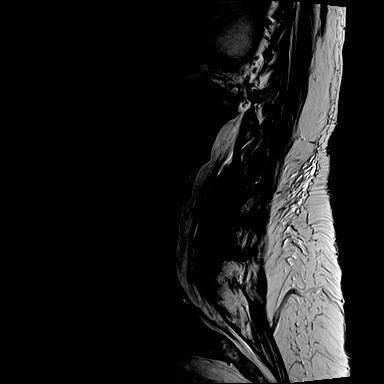
[im 11/15]
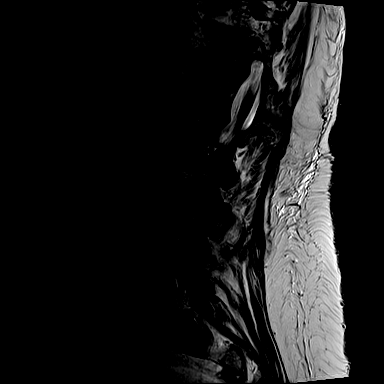
[im 15/15]
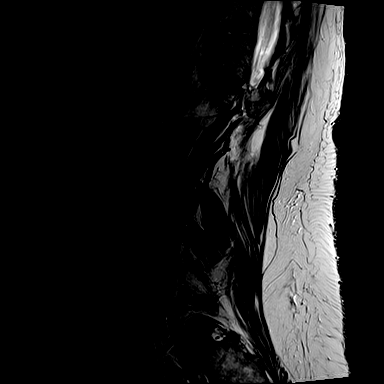

[Series 6: T1 · sagittal · 4.0mm · 0.81mm/px · 5 of 15 slices shown (1 of 2)]
[im 1/15]
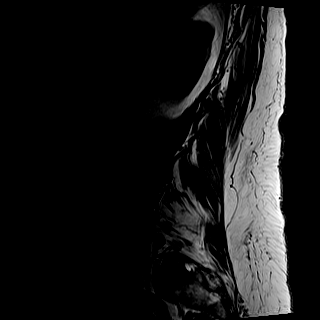
[im 4/15]
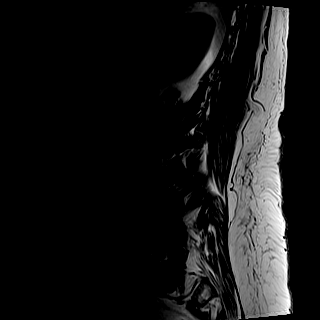
[im 8/15]
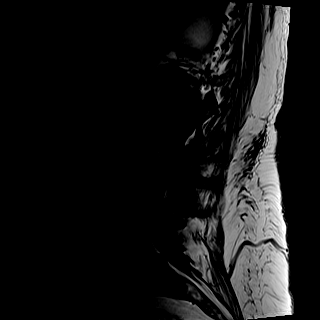
[im 11/15]
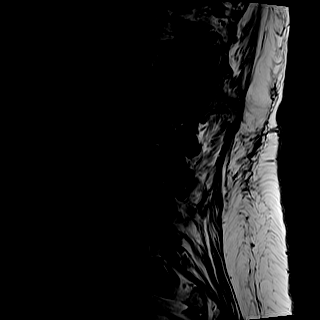
[im 15/15]
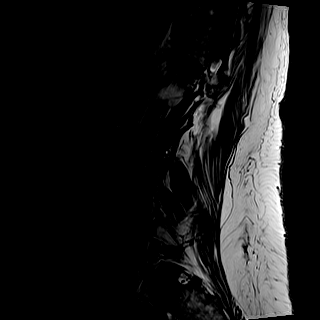

[Series 8: T2 · axial · 4.0mm · 0.70mm/px · z∈[-10,+175]mm · 9 of 31 slices shown (2 of 3)]
[im 1/31]
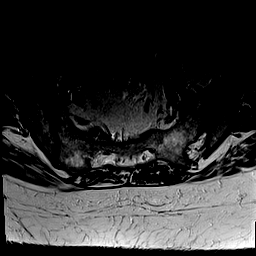
[im 6/31]
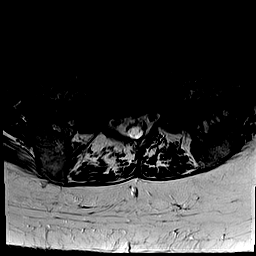
[im 9/31]
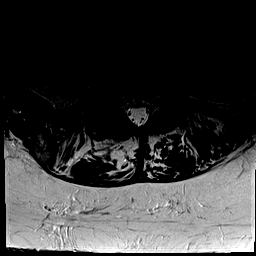
[im 14/31]
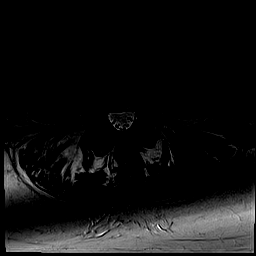
[im 17/31]
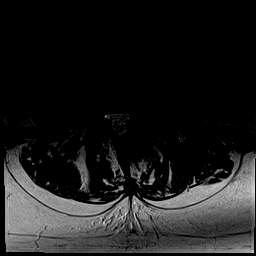
[im 22/31]
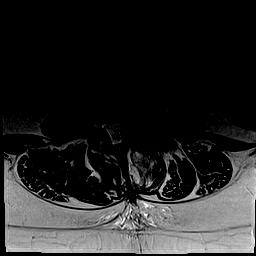
[im 25/31]
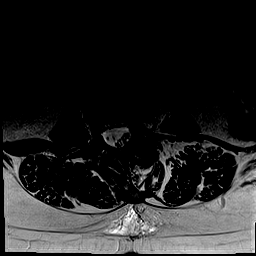
[im 28/31]
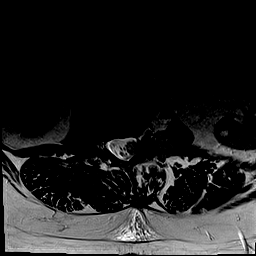
[im 31/31]
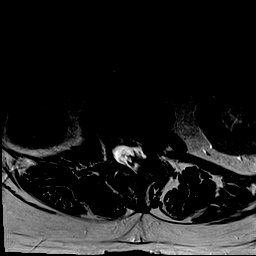

[Series 9: T1 · axial · 4.0mm · 0.35mm/px · z∈[-10,+131]mm · 6 of 31 slices shown (2 of 2)]
[im 1/31]
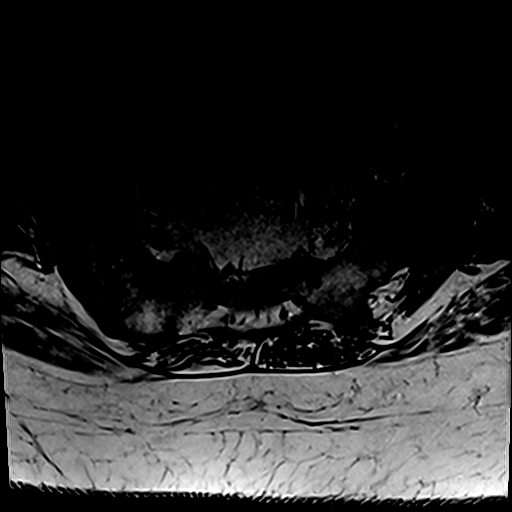
[im 6/31]
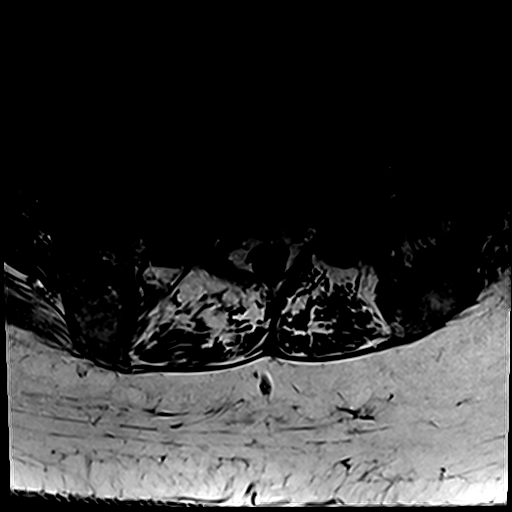
[im 9/31]
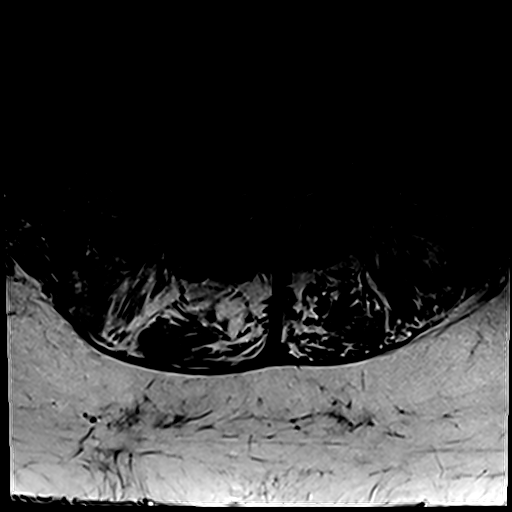
[im 14/31]
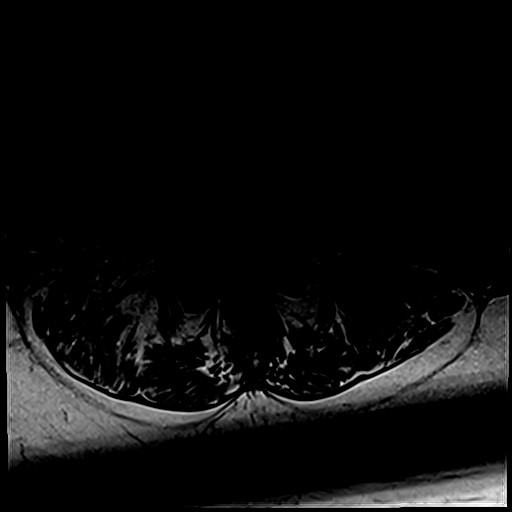
[im 17/31]
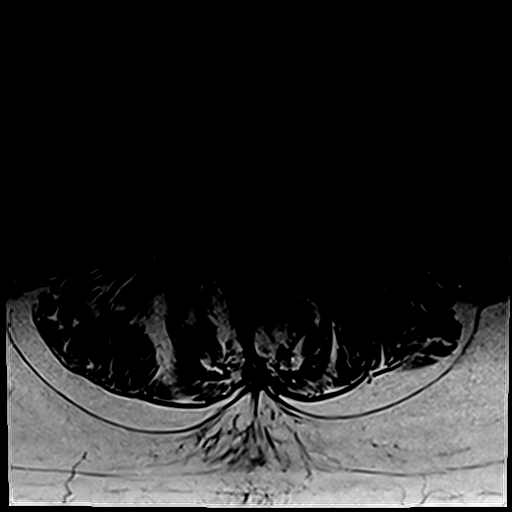
[im 22/31]
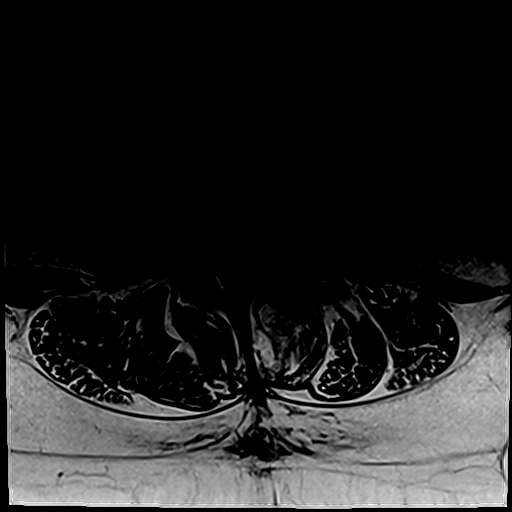

[Series 10: T2 · coronal · 4.0mm · 0.68mm/px · 8 of 20 slices shown (3 of 3)]
[im 1/20]
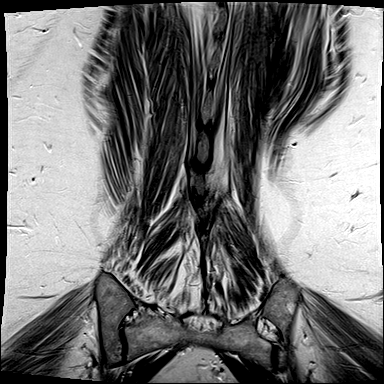
[im 3/20]
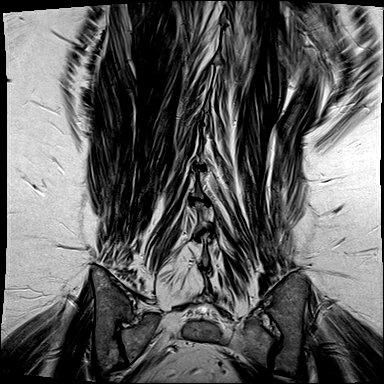
[im 6/20]
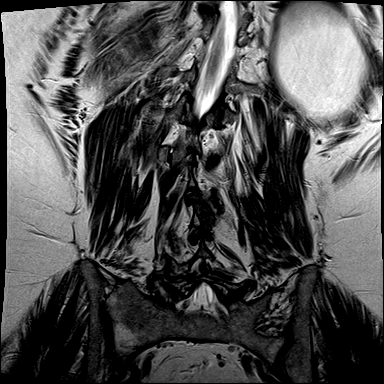
[im 9/20]
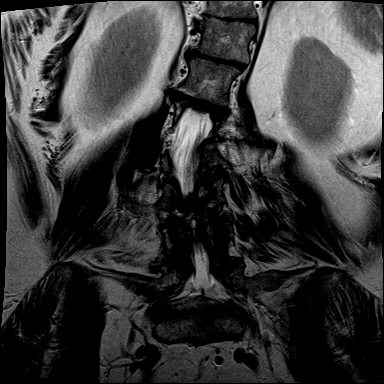
[im 11/20]
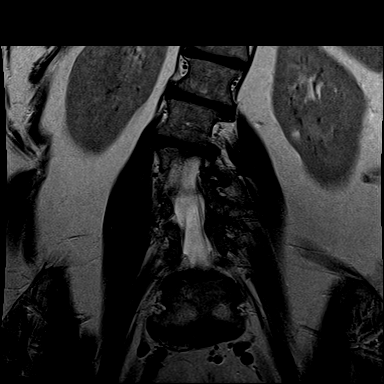
[im 14/20]
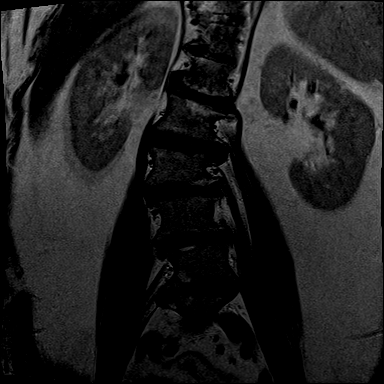
[im 17/20]
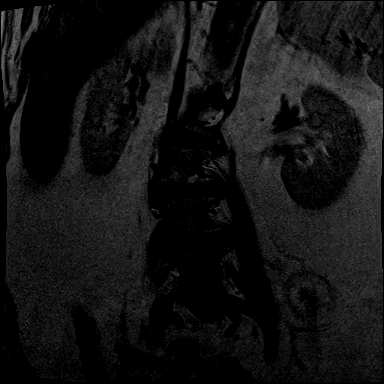
[im 20/20]
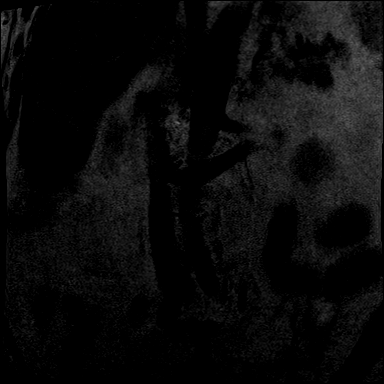

[33 of 48 positions shown; findings below may reference images not displayed]

FINDINGS: Segmentation: There are five lumbar type vertebral bodies. The last
full intervertebral disc space is labeled L5-S1. This correlates
with the prior study.

Alignment: Significant scoliotic curvature but normal overall
alignment in the sagittal plane. Mild degenerative anterolisthesis
of L5 is again demonstrated.

Vertebrae: Endplate reactive changes but no fractures or bone
lesions. Severe facet disease and enlarged spinous processes
contacting each other and suggesting Baastrup's disease.

Conus medullaris and cauda equina: Conus extends to the T12-L1
level. Conus and cauda equina appear normal.

Paraspinal and other soft tissues: No significant paraspinal or
retroperitoneal findings.

Disc levels:

L1-2: Asymmetric left-sided facet disease with thickening and
buckling of the ligamentum flavum. There is mild mass effect on the
left side of the thecal sac. There is also mild to moderate left
lateral recess stenosis. No significant spinal or foraminal
stenosis.

L2-3: Bulging annulus, osteophytic ridging and facet disease
asymmetric on the left side. No significant spinal stenosis. Mild
left lateral recess and left foraminal stenosis.

L3-4: Bulging annulus, osteophytic ridging and advanced facet
disease contributing to early spinal stenosis and mild bilateral
lateral recess stenosis. No significant foraminal stenosis. Mild
left foraminal encroachment.

L4-5: Advanced facet disease, right greater than left in conjunction
with a bulging annulus contributing to right lateral recess stenosis
and mild right foraminal stenosis. No significant spinal or left
foraminal stenosis.

L5-S1: Severe disc disease and facet disease but no focal disc
protrusion or significant spinal or foraminal stenosis.
IMPRESSION: 1. Scoliosis and degenerative lumbar spondylosis with multilevel
disc disease and facet disease.
2. Mild to moderate left lateral recess stenosis at L1-2.
3. Mild left lateral recess and left foraminal stenosis at L2-3.
4. Early spinal stenosis and mild bilateral lateral recess stenosis
at L3-4. There is also mild left foraminal encroachment.
5. Right lateral recess stenosis and mild right foraminal stenosis
at L4-5.

## 2022-09-24 ENCOUNTER — Ambulatory Visit: Payer: 59 | Admitting: Family Medicine

## 2022-09-24 ENCOUNTER — Encounter: Payer: Self-pay | Admitting: Family Medicine

## 2022-09-24 VITALS — BP 107/78 | HR 88 | Ht 65.0 in | Wt 232.0 lb

## 2022-09-24 DIAGNOSIS — S76319A Strain of muscle, fascia and tendon of the posterior muscle group at thigh level, unspecified thigh, initial encounter: Secondary | ICD-10-CM

## 2022-09-24 DIAGNOSIS — S76311A Strain of muscle, fascia and tendon of the posterior muscle group at thigh level, right thigh, initial encounter: Secondary | ICD-10-CM

## 2022-09-24 NOTE — Progress Notes (Signed)
BP 107/78   Pulse 88   Ht '5\' 5"'$  (1.651 m)   Wt 105.2 kg   SpO2 97%   BMI 38.61 kg/m    Subjective:   Patient ID: Sydney [name], female    DOB: Aug 27, 1960, 62 y.o.   MRN: [id number]  HPI: Sydney [name] is a 62 y.o. female presenting on 09/24/2022 for Knee Pain (Right. Gave away over the weekend and pt fell. Hurts to bend.)  Patient presents for pain in her lateral right knee, posterior knee and lower hamstring for 4 days. Patient states she was getting up from the dinner table on Saturday and her knee "gave way." Patient caught herself and states her knee turned medially when this occurred. Patient states she has had a few more episodes of her knee "giving way" and describes the pain as constant and dull. Patient states pain was a 10/10 on Saturday and is a 3/10 in the office. Patient has tried ice, Tylenol extra strength, and Flexeril with some improvement. Patient denies any use of her prescribed Vicodin for the pain. Patient denies any hip or ankle pain or recent falls.  Relevant past medical, surgical, family and social history were reviewed and updated as indicated. Interim medical history were reviewed. Allergies and medications were reviewed and updated.  Review of Systems  Musculoskeletal:  Positive for gait problem and myalgias.  Skin:  Negative for color change, rash and wound.  Neurological:  Negative for syncope.  All other systems reviewed and are negative.   Per HPI unless specifically indicated above.   Allergies as of 09/24/2022       Reactions   Bee Pollen Anaphylaxis, Swelling   Bee Venom Anaphylaxis, Swelling   Butorphanol Other (See Comments)   Not sure told by md she was allergic after surgery.   Meloxicam Anxiety   Mood disorder Altered her personality   Penicillins Anaphylaxis, Hives   Unable to Recall As child mom was told she is highly allergic Did it involve swelling of the face/tongue/throat, SOB, or low BP? Unknown Did it involve sudden or severe  rash/hives, skin peeling, or any reaction on the inside of your mouth or nose? Unknown Did you need to seek medical attention at a hospital or doctor's office? Unknown When did it last happen?      childhood allergy If all above answers are "NO", may proceed with cephalosporin use.   Prochlorperazine Edisylate Anaphylaxis   Sulfa Antibiotics Anaphylaxis   Unable to Recall   Tramadol Anxiety   Didn't like the way it made her feel   Clindamycin Hives   Levaquin [levofloxacin] Itching   Topiramate Nausea And Vomiting   Doxycycline Dermatitis, Hives, Itching, Photosensitivity, Rash        Medication List        Accurate as of September 24, 2022  3:44 PM. If you have any questions, ask your nurse or doctor.          albuterol 108 (90 Base) MCG/ACT inhaler Commonly known as: VENTOLIN HFA Inhale 2 puffs into the lungs every 6 (six) hours as needed for wheezing or shortness of breath.   amLODipine 5 MG tablet Commonly known as: NORVASC Take 1 tablet (5 mg total) by mouth daily.   cetirizine 10 MG tablet Commonly known as: ZYRTEC Take 10 mg by mouth daily.   cyclobenzaprine 10 MG tablet Commonly known as: FLEXERIL TAKE 1/2 TO 1 TABLET BY MOUTH 3 TIMES A DAY AS NEEDED FOR MUSCLE SPASMS   EPINEPHrine 0.3  mg/0.3 mL Soaj injection Commonly known as: EpiPen 2-Pak Inject 0.3 mg into the muscle as needed for anaphylaxis.   gabapentin 100 MG capsule Commonly known as: NEURONTIN Take 1 capsule (100 mg total) by mouth 3 (three) times daily.   HYDROcodone-acetaminophen 5-325 MG tablet Commonly known as: NORCO/VICODIN Take 1 tablet by mouth every 4 (four) hours as needed for severe pain (put on file).   metoprolol succinate 25 MG 24 hr tablet Commonly known as: TOPROL-XL Take 1 tablet (25 mg total) by mouth daily.   rosuvastatin 10 MG tablet Commonly known as: CRESTOR Take 1 tablet (10 mg total) by mouth every other day.   sertraline 100 MG tablet Commonly known as: ZOLOFT Take  1.5 tablets (150 mg total) by mouth daily.   traZODone 100 MG tablet Commonly known as: DESYREL TAKE 1 TO 2 TABLETS AT     BEDTIME AS NEEDED FOR SLEEP         Objective:   BP 107/78   Pulse 88   Ht '5\' 5"'$  (1.651 m)   Wt 105.2 kg   SpO2 97%   BMI 38.61 kg/m   Wt Readings from Last 3 Encounters:  09/24/22 105.2 kg  09/17/22 104.3 kg  07/10/22 104.3 kg    Physical Exam Vitals reviewed.  Constitutional:      Appearance: Normal appearance.  Eyes:     General: No scleral icterus.    Conjunctiva/sclera: Conjunctivae normal.  Musculoskeletal:     Right hip: Normal.     Left hip: Normal.     Right upper leg: Tenderness (in mid and lower hamstring) present. No swelling, edema, deformity or bony tenderness.     Left upper leg: Normal. No swelling, edema, deformity, tenderness or bony tenderness.     Right knee: Normal. No deformity, effusion, erythema, ecchymosis or bony tenderness. No LCL laxity, MCL laxity, ACL laxity or PCL laxity. Normal alignment, normal meniscus and normal patellar mobility.     Instability Tests: Anterior drawer test negative. Posterior drawer test negative. Anterior Lachman test negative. Medial McMurray test negative and lateral McMurray test negative.     Left knee: No deformity, effusion, erythema, ecchymosis or bony tenderness.     Right lower leg: Normal. No swelling or tenderness (medial & lateral aspect of lower extremity right below knee joint). No edema.     Left lower leg: Normal. No swelling. No edema.     Right ankle: Normal.     Left ankle: Normal.     Right foot: Normal. Normal pulse.     Left foot: Normal. Normal pulse.       Legs:  Skin:    General: Skin is warm and dry.  Neurological:     Mental Status: She is alert.     Gait: Gait abnormal (limping).  Psychiatric:        Mood and Affect: Mood normal.        Behavior: Behavior normal.    Assessment & Plan:   Problem List Items Addressed This Visit   None Visit Diagnoses      Partial hamstring tear, initial encounter    -  Primary       Patient's presentation is consistent with a partial hamstring tear. Patient was given a list of exercises to use at home 1-2 times per day for 2-3 months. Patient was also instructed to use a heating pad on her hamstring and use OTC NSAID such as Ibuprofen to help with inflammation.   Follow up plan:  Return if symptoms worsen or fail to improve.  Counseling provided for all of the vaccine components No orders of the defined types were placed in this encounter.   [place], PA-S2 [facility] 09/24/2022, 3:44 PM  I was personally present for all components of the history, physical exam and/or medical decision making.  I agree with the documentation performed by the PA student and agree with assessment and plan above.  PA student was [employer]. Vonna Kotyk Lashae Wollenberg [employer] [facility] 09/24/2022, 3:44 PM

## 2022-09-28 ENCOUNTER — Encounter: Payer: Self-pay | Admitting: Family Medicine

## 2022-10-03 ENCOUNTER — Ambulatory Visit: Payer: No Typology Code available for payment source | Admitting: Family Medicine

## 2022-10-09 ENCOUNTER — Ambulatory Visit (INDEPENDENT_AMBULATORY_CARE_PROVIDER_SITE_OTHER): Payer: 59 | Admitting: Orthopaedic Surgery

## 2022-10-09 ENCOUNTER — Encounter: Payer: Self-pay | Admitting: Orthopaedic Surgery

## 2022-10-09 VITALS — Ht 65.0 in | Wt 232.0 lb

## 2022-10-09 DIAGNOSIS — M5416 Radiculopathy, lumbar region: Secondary | ICD-10-CM

## 2022-10-09 NOTE — Progress Notes (Signed)
Office Visit Note   Patient: Sydney [name]           Date of Birth: 20-Aug-1960           MRN: [id number] Visit Date: 10/09/2022              Requested by: Janora Norlander, DO [place],  [place] [id number] PCP: Janora Norlander, DO   Assessment & Plan: Visit Diagnoses:  1. Lumbar radiculopathy     Plan: Patient got good relief from Dr. Ernestina Patches injection at L4-5 centrally.  She states after a month or 2 her pain came back but she did get good relief.  She can call in a few months if she would like to have a repeat injection.  Should continue to work on a walking program and weight loss.  Follow-Up Instructions: No follow-ups on file.   Orders:  No orders of the defined types were placed in this encounter.  No orders of the defined types were placed in this encounter.     Procedures: No procedures performed   Clinical Data: No additional findings.   Subjective: Chief Complaint  Patient presents with   Lower Back - Pain   Left Hip - Pain   Right Shoulder - Pain    HPI 62 year old female with returns with ongoing chronic back pain.  She has some lumbar curvature dextroscoliosis with foraminal narrowing from L1-S1 has various levels some left some right.  She has more pain that bothers her on the left side.  She is also had pain in her shoulder has been ambulating with a cane.  She has had previous cervical fusion C5-6 C6-7.  Some facet arthropathy on the right at C4-5.  Right shoulder has some tendinosis biceps tendinosis and she is seen Dr. Marlou Sa in the past for evaluation of this.  She has a glenohumeral arthritis as well and some her pain may be related to C4-5 disc and some from the shoulder.  Previous right left total of arthroplasty is doing well from 2021.  Review of Systems all other systems are noncontributory to HPI.   Objective: Vital Signs: Ht 5\' 5"  (1.651 m)   Wt 232 lb (105.2 kg)   BMI 38.61 kg/m   Physical Exam Constitutional:       Appearance: She is well-developed.  HENT:     Head: Normocephalic.     Right Ear: External ear normal.     Left Ear: External ear normal. There is no impacted cerumen.  Eyes:     Pupils: Pupils are equal, round, and reactive to light.  Neck:     Thyroid: No thyromegaly.     Trachea: No tracheal deviation.  Cardiovascular:     Rate and Rhythm: Normal rate.  Pulmonary:     Effort: Pulmonary effort is normal.  Abdominal:     Palpations: Abdomen is soft.  Musculoskeletal:     Cervical back: No rigidity.  Skin:    General: Skin is warm and dry.  Neurological:     Mental Status: She is alert and oriented to person, place, and time.  Psychiatric:        Behavior: Behavior normal.     Ortho Exam patient is amatory without limp negative straight leg raising 90 degrees.  Lumbar curvature.  No atrophy of the lower extremities no isolated motor weakness.  Specialty Comments:  EXAM: MRI LUMBAR SPINE WITHOUT CONTRAST   TECHNIQUE: Multiplanar, multisequence MR imaging of the lumbar spine was  performed. No intravenous contrast was administered.   COMPARISON:  04/27/2020   FINDINGS: Segmentation:  5 lumbar-type vertebral bodies.   Alignment: Scoliosis, with dextroscoliosis of the lumbar spine. Trace retrolisthesis of T12 on L1 and L1 on L2 and trace anterolisthesis of L5 on S1, unchanged.   Vertebrae: No acute fracture or suspicious osseous lesion. Endplate degenerative changes. 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   Conus medullaris and cauda equina: Conus extends to the L1 level. Conus and cauda equina appear normal.   Paraspinal and other soft tissues: Asymmetric fatty atrophy of the superior left psoas and inferior right-greater-than-left paraspinous muscles.   Disc levels:   T12-L1: Mild disc bulge. Mild facet arthropathy. No spinal canal stenosis or neural foraminal narrowing.   L1-L2:  Mild left eccentric disc bulge. Left-greater-than-right facet arthropathy. Narrowing of the left lateral recess. No spinal canal stenosis. Mild left neural foraminal narrowing, which has progressed from the prior exam.   L2-L3: Mild disc bulge with left paracentral and subarticular protrusion which narrows the left lateral recess. Severe left and moderate right facet arthropathy. No spinal canal stenosis. Mild-to-moderate left neural foraminal narrowing which has progressed from the prior exam   L3-L4: Mild disc bulge with left foraminal and extreme lateral disc protrusion. Moderate facet arthropathy. Narrowing of the lateral recesses. No spinal canal stenosis. Moderate left neural foraminal narrowing, which has progressed from the prior exam.   L4-L5: Mild disc bulge with right foraminal and extreme lateral protrusion. Severe right and mild left facet arthropathy. Narrowing of the right lateral recess. No spinal canal stenosis. Mild right neural foraminal narrowing, unchanged.   L5-S1: Mild disc bulge. Moderate facet arthropathy. No spinal canal stenosis. Mild right neural foraminal narrowing, which has progressed from the prior exam.   IMPRESSION: 1. Progression of previously noted asymmetric degenerative changes with progressive left neural foraminal narrowing in the upper lumbar spine, which is now moderate at L3-L4, mild-to-moderate at L2-L3, and mild at L1-L2. Narrowing of the left lateral recesses at L1-L2 and L2-L3 and bilateral lateral recesses L3-L4 could affect the descending left L2 and L3 nerve roots and bilateral L4 nerve roots, respectively. 2. Mild right neural foraminal narrowing at L4-L5 and L5-S1. Narrowing of the right lateral recess at L4-L5 could affect the descending right L5 nerve roots.     Electronically Signed   By: Merilyn Baba M.D.   On: 02/05/2022 13:58  Imaging: No results found.   PMFS History: Patient Active Problem List   Diagnosis  Date Noted   Facet degeneration of lumbar region 02/13/2022   Peripheral vascular disease with stasis dermatitis 09/03/2021   Urine frequency 05/10/2021   Cellulitis of left leg 01/18/2021   Rash 01/18/2021   History of bilateral hip arthroplasty 01/03/2021   Upper respiratory tract infection 12/30/2020   Non-recurrent acute serous otitis media of right ear 12/30/2020   Pharyngitis 10/29/2020   Trochanteric bursitis, left hip 09/13/2020   Closed fracture of multiple pubic rami, left, sequela 08/16/2020   H/O adenomatous polyp of colon 05/23/2020   FH: colon cancer in relative diagnosed at >94 years old 05/23/2020   Impingement syndrome of right shoulder 05/03/2020   H/O total hip arthroplasty 12/06/2019   Back pain 07/01/2018   DDD (degenerative disc disease), lumbosacral 07/01/2018   Venous insufficiency of both lower extremities 11/17/2016   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 level with radiculopathy 08/18/2016   Morbid obesity with BMI of 40.0-44.9,  adult ([place]) 07/28/2016   Chronic pain syndrome 10/24/2015   Dysfunction of both eustachian tubes 07/26/2014   Calculus of left kidney 04/25/2014   Impaired fasting glucose 04/05/2014   History of bladder surgery 04/03/2014   History of hysterectomy 04/03/2014   Urge incontinence 04/03/2014   Asthma 07/28/2013   Nontoxic uninodular goiter 07/28/2013   Solitary pulmonary nodule 07/28/2013   Lumbar radiculopathy 04/20/2013   Insomnia 03/22/2013   Essential hypertension 08/18/2011   Moderate episode of recurrent major depressive disorder ([place]) 04/03/2011   Other B-complex deficiencies 08/16/2010   Family history of cerebrovascular accident (CVA) 04/17/2010   Family history of malignant neoplasm of gastrointestinal tract 04/17/2010   Family history of other cardiovascular diseases(V17.49) 04/17/2010   Past Medical History:  Diagnosis Date   Allergy    Anemia    history of   Anxiety    Arthritis    Asthma    Atherosclerosis     Depression    Family history of adverse reaction to anesthesia    pt's mother felt everything and couldn't speak during a gallbladder surgery   GERD (gastroesophageal reflux disease)    History of hiatal hernia    History of kidney stones    Hypertension    Pneumonia    a few times   TIA (transient ischemic attack) 2012    Family History  Problem Relation Age of Onset   Anxiety disorder Mother    Depression Mother    Heart disease Mother    Hypertension Mother    Arrhythmia Mother    Cancer Father        colon, age greater than 88   Lung cancer Father    Migraines Sister    Cancer Daughter        nerve sheath sarcoma   Alcohol abuse Brother    Breast cancer Neg Hx     Past Surgical History:  Procedure Laterality Date   ABDOMINAL HYSTERECTOMY     abdominal   APPENDECTOMY     bladder tack     CARPAL TUNNEL RELEASE Bilateral    CERVICAL SPINE SURGERY     5-6, 6-7   CHOLECYSTECTOMY     COLONOSCOPY WITH PROPOFOL N/A 07/10/2020   Procedure: COLONOSCOPY WITH PROPOFOL;  Surgeon: Eloise Harman, DO;  [facility];  Service: Endoscopy;  Laterality: N/A;  12:15pm   ENDOVENOUS ABLATION SAPHENOUS VEIN W/ LASER Left 11/28/2021   endovenous laser ablation left greater saphenous vein by Harold Barban MD   HAMMER TOE SURGERY Right    JOINT REPLACEMENT     TOTAL HIP 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   Procedure: LEFT TOTAL HIP ARTHROPLASTY -DIRECT ANTERIOR;  Surgeon: Marybelle Killings, MD;  Location: [facility];  Service: Orthopedics;  Laterality: Left;   TOTAL HIP ARTHROPLASTY Right 12/05/2019   Procedure: RIGHT TOTAL HIP ARTHROPLASTY ANTERIOR APPROACH  DIRECT ANTERIOR;  Surgeon: Marybelle Killings, MD;  Location: [facility];  Service: Orthopedics;  Laterality: Right;   Social History   Occupational History   Not on file  Tobacco Use   Smoking status: Every Day    Packs/day: 0.50    Years: 23.00    Additional pack years: 0.00    Total pack years: 11.50    Types: Cigarettes   Smokeless  tobacco: Never  Vaping Use   Vaping Use: Never used  Substance and Sexual Activity   Alcohol use: Yes    Comment: occ   Drug use: Never   Sexual activity: Not  Currently

## 2022-11-03 ENCOUNTER — Encounter: Payer: Self-pay | Admitting: Family Medicine

## 2022-11-03 ENCOUNTER — Ambulatory Visit: Payer: 59 | Admitting: Family Medicine

## 2022-11-03 VITALS — BP 116/69 | HR 76 | Temp 98.3°F | Ht 65.0 in | Wt 239.0 lb

## 2022-11-03 DIAGNOSIS — R32 Unspecified urinary incontinence: Secondary | ICD-10-CM

## 2022-11-03 DIAGNOSIS — I10 Essential (primary) hypertension: Secondary | ICD-10-CM

## 2022-11-03 DIAGNOSIS — M5442 Lumbago with sciatica, left side: Secondary | ICD-10-CM | POA: Diagnosis not present

## 2022-11-03 DIAGNOSIS — E7800 Pure hypercholesterolemia, unspecified: Secondary | ICD-10-CM

## 2022-11-03 DIAGNOSIS — G894 Chronic pain syndrome: Secondary | ICD-10-CM

## 2022-11-03 DIAGNOSIS — M47816 Spondylosis without myelopathy or radiculopathy, lumbar region: Secondary | ICD-10-CM | POA: Diagnosis not present

## 2022-11-03 DIAGNOSIS — M5137 Other intervertebral disc degeneration, lumbosacral region: Secondary | ICD-10-CM | POA: Diagnosis not present

## 2022-11-03 DIAGNOSIS — Z79899 Other long term (current) drug therapy: Secondary | ICD-10-CM

## 2022-11-03 DIAGNOSIS — R635 Abnormal weight gain: Secondary | ICD-10-CM

## 2022-11-03 DIAGNOSIS — M50122 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 level with radiculopathy: Secondary | ICD-10-CM

## 2022-11-03 DIAGNOSIS — M51379 Other intervertebral disc degeneration, lumbosacral region without mention of lumbar back pain or lower extremity pain: Secondary | ICD-10-CM

## 2022-11-03 DIAGNOSIS — F32A Depression, unspecified: Secondary | ICD-10-CM

## 2022-11-03 DIAGNOSIS — G8929 Other chronic pain: Secondary | ICD-10-CM

## 2022-11-03 LAB — URINALYSIS
Bilirubin, UA: NEGATIVE
Glucose, UA: NEGATIVE
Ketones, UA: NEGATIVE
Leukocytes,UA: NEGATIVE
Nitrite, UA: NEGATIVE
Protein,UA: NEGATIVE
Specific Gravity, UA: 1.015 (ref 1.005–1.030)
Urobilinogen, Ur: 0.2 mg/dL (ref 0.2–1.0)
pH, UA: 6.5 (ref 5.0–7.5)

## 2022-11-03 LAB — BAYER DCA HB A1C WAIVED: HB A1C (BAYER DCA - WAIVED): 5.3 % (ref 4.8–5.6)

## 2022-11-03 MED ORDER — ROSUVASTATIN CALCIUM 10 MG PO TABS
10.0000 mg | ORAL_TABLET | ORAL | 3 refills | Status: DC
Start: 2022-11-03 — End: 2023-02-04

## 2022-11-03 MED ORDER — AMLODIPINE BESYLATE 5 MG PO TABS: 5.0000 mg | ORAL_TABLET | Freq: Every day | ORAL | 3 refills | Status: DC

## 2022-11-03 MED ORDER — METOPROLOL SUCCINATE ER 25 MG PO TB24: 25.0000 mg | ORAL_TABLET | Freq: Every day | ORAL | 3 refills | Status: DC

## 2022-11-03 MED ORDER — HYDROCODONE-ACETAMINOPHEN 5-325 MG PO TABS
1.0000 | ORAL_TABLET | ORAL | 0 refills | Status: DC | PRN
Start: 2022-11-03 — End: 2023-04-29

## 2022-11-03 MED ORDER — SERTRALINE HCL 100 MG PO TABS
150.0000 mg | ORAL_TABLET | Freq: Every day | ORAL | 3 refills | Status: DC
Start: 2022-11-03 — End: 2023-03-30

## 2022-11-03 NOTE — Progress Notes (Signed)
Subjective: CC: Chronic pain PCP: Raliegh Ip, DO Sydney [name] is a 62 y.o. female presenting to clinic today for:  1.  DDD, lumbar spine, chronic pain syndrome Patient is now status post spinal injection.  She was seen by her back specialist, Dr. Ophelia Charter, in the last couple of months and she was placed on gabapentin.  She notes that she was not able to tolerate 100 mg 3 times daily and has subsequently tried to move to 2 to 300 mg nightly but she finds herself to be overly sedated the following day.  She has remained on max dose trazodone 200 mg at bedtime and has not tried backing off on this to see if this would alleviate some of that symptom.  She does continue to have mobility issues during the daytime secondary to chronic pain and admits that she is not as physically active as she had been and has subsequently gained weight.  She would like nonfasting labs to be collected today.  Has not utilize her Norco and still has several tablets left over.  When she did use it for this issue it did not alleviate anything.  2.  Urinary incontinence Patient reports increased frequency of urinary incontinence over the last several months.  She was previously under the care of urology out in [place] but after symptoms got better she was lost to follow-up.  She would like to reestablish care here in [place].  Denies any dysuria, hematuria, flank pain but does have a history of renal stones.   ROS: Per HPI  Allergies  Allergen Reactions   Bee Pollen Anaphylaxis and Swelling   Bee Venom Anaphylaxis and Swelling   Butorphanol Other (See Comments)    Not sure told by md she was allergic after surgery.    Meloxicam Anxiety    Mood disorder Altered her personality    Penicillins Anaphylaxis and Hives    Unable to Recall As child mom was told she is highly allergic Did it involve swelling of the face/tongue/throat, SOB, or low BP? Unknown Did it involve sudden or severe rash/hives,  skin peeling, or any reaction on the inside of your mouth or nose? Unknown Did you need to seek medical attention at a hospital or doctor's office? Unknown When did it last happen?      childhood allergy If all above answers are "NO", may proceed with cephalosporin use.    Prochlorperazine Edisylate Anaphylaxis   Sulfa Antibiotics Anaphylaxis    Unable to Recall   Tramadol Anxiety    Didn't like the way it made her feel    Clindamycin Hives   Levaquin [Levofloxacin] Itching   Topiramate Nausea And Vomiting   Doxycycline Dermatitis, Hives, Itching, Photosensitivity and Rash   Past Medical History:  Diagnosis Date   Allergy    Anemia    history of   Anxiety    Arthritis    Asthma    Atherosclerosis    Depression    Family history of adverse reaction to anesthesia    pt's mother felt everything and couldn't speak during a gallbladder surgery   GERD (gastroesophageal reflux disease)    History of hiatal hernia    History of kidney stones    Hypertension    Pneumonia    a few times   TIA (transient ischemic attack) 2012    Current Outpatient Medications:    albuterol (VENTOLIN HFA) 108 (90 Base) MCG/ACT inhaler, Inhale 2 puffs into the lungs every 6 (six) hours as  needed for wheezing or shortness of breath., Disp: , Rfl:    amLODipine (NORVASC) 5 MG tablet, Take 1 tablet (5 mg total) by mouth daily., Disp: 90 tablet, Rfl: 3   cetirizine (ZYRTEC) 10 MG tablet, Take 10 mg by mouth daily., Disp: , Rfl:    cyclobenzaprine (FLEXERIL) 10 MG tablet, TAKE 1/2 TO 1 TABLET BY MOUTH 3 TIMES A DAY AS NEEDED FOR MUSCLE SPASMS, Disp: 90 tablet, Rfl: 1   EPINEPHrine (EPIPEN 2-PAK) 0.3 mg/0.3 mL IJ SOAJ injection, Inject 0.3 mg into the muscle as needed for anaphylaxis., Disp: 2 each, Rfl: 0   gabapentin (NEURONTIN) 100 MG capsule, Take 1 capsule (100 mg total) by mouth 3 (three) times daily., Disp: 90 capsule, Rfl: 1   HYDROcodone-acetaminophen (NORCO/VICODIN) 5-325 MG tablet, Take 1 tablet  by mouth every 4 (four) hours as needed for severe pain (put on file)., Disp: 30 tablet, Rfl: 0   metoprolol succinate (TOPROL-XL) 25 MG 24 hr tablet, Take 1 tablet (25 mg total) by mouth daily., Disp: 90 tablet, Rfl: 3   rosuvastatin (CRESTOR) 10 MG tablet, Take 1 tablet (10 mg total) by mouth every other day., Disp: 45 tablet, Rfl: 3   sertraline (ZOLOFT) 100 MG tablet, Take 1.5 tablets (150 mg total) by mouth daily., Disp: 90 tablet, Rfl: 3   traZODone (DESYREL) 100 MG tablet, TAKE 1 TO 2 TABLETS AT     BEDTIME AS NEEDED FOR SLEEP, Disp: 180 tablet, Rfl: 3 Social History   Socioeconomic History   Marital status: Married    Spouse name: Not on file   Number of children: 3   Years of education: Not on file   Highest education level: 12th grade  Occupational History   Not on file  Tobacco Use   Smoking status: Every Day    Packs/day: 0.50    Years: 23.00    Additional pack years: 0.00    Total pack years: 11.50    Types: Cigarettes   Smokeless tobacco: Never  Vaping Use   Vaping Use: Never used  Substance and Sexual Activity   Alcohol use: Yes    Comment: occ   Drug use: Never   Sexual activity: Not Currently  Other Topics Concern   Not on file  Social History Narrative   Recently relocated to [place] from [place].  She resides at home with her husband.   She has 3 children but 1 passed away from cancer.   Social Determinants of Health   Financial Resource Strain: Low Risk  (10/30/2022)   Overall Financial Resource Strain (CARDIA)    Difficulty of Paying Living Expenses: Not hard at all  Food Insecurity: No Food Insecurity (10/30/2022)   Hunger Vital Sign    Worried About Running Out of Food in the Last Year: Never true    Ran Out of Food in the Last Year: Never true  Transportation Needs: No Transportation Needs (10/30/2022)   PRAPARE - Administrator, Civil Service (Medical): No    Lack of Transportation (Non-Medical): No  Physical Activity: Unknown  (10/30/2022)   Exercise Vital Sign    Days of Exercise per Week: 2 days    Minutes of Exercise per Session: Patient declined  Stress: Stress Concern Present (10/30/2022)   [employer] of Occupational Health - Occupational Stress Questionnaire    Feeling of Stress : To some extent  Social Connections: Unknown (10/30/2022)   Social Connection and Isolation Panel [NHANES]    Frequency of Communication with  Friends and Family: Twice a week    Frequency of Social Gatherings with Friends and Family: Once a week    Attends Religious Services: Patient declined    Database administrator or Organizations: No    Attends Engineer, structural: Not on file    Marital Status: Married  Catering manager Violence: Not on file   Family History  Problem Relation Age of Onset   Anxiety disorder Mother    Depression Mother    Heart disease Mother    Hypertension Mother    Arrhythmia Mother    Cancer Father        colon, age greater than 66   Lung cancer Father    Migraines Sister    Cancer Daughter        nerve sheath sarcoma   Alcohol abuse Brother    Breast cancer Neg Hx     Objective: Office vital signs reviewed. BP 116/69   Pulse 76   Temp 98.3 F (36.8 C)   Ht 5\' 5"  (1.651 m)   Wt 239 lb (108.4 kg)   SpO2 98%   BMI 39.77 kg/m   Physical Examination:  General: Awake, alert, morbidly obese, No acute distress HEENT: No thyromegaly.  No exophthalmos Cardio: regular rate and rhythm, S1S2 heard, no murmurs appreciated Pulm: clear to auscultation bilaterally, no wheezes, rhonchi or rales; normal work of breathing on room air MSK: Antalgic gait and station.  Ambulating independently Psych: Mood stable, speech normal, affect appropriate.  Very pleasant, interactive  Results for orders placed or performed in visit on 11/03/22 (from the past 24 hour(s))  Bayer DCA Hb A1c Waived     Status: None   Collection Time: 11/03/22  2:54 PM  Result Value Ref Range   HB A1C (BAYER DCA  - WAIVED) 5.3 4.8 - 5.6 %   Narrative   Performed at:  [address] - [facility] [address] [place], [place]  [id number] Lab Director: Rockie Neighbours [facility], Phone:  [phone]  Urinalysis     Status: Abnormal   Collection Time: 11/03/22  2:55 PM  Result Value Ref Range   Specific Gravity, UA 1.015 1.005 - 1.030   pH, UA 6.5 5.0 - 7.5   Color, UA Yellow Yellow   Appearance Ur Clear Clear   Leukocytes,UA Negative Negative   Protein,UA Negative Negative/Trace   Glucose, UA Negative Negative   Ketones, UA Negative Negative   RBC, UA Trace (A) Negative   Bilirubin, UA Negative Negative   Urobilinogen, Ur 0.2 0.2 - 1.0 mg/dL   Nitrite, UA Negative Negative   Narrative   Performed at:  20 - [facility] [address], [place], [place]  [id number] Lab Director: Rockie Neighbours [facility], Phone:  [phone]     Assessment/ Plan: 62 y.o. female   DDD (degenerative disc disease), lumbosacral - Plan: HYDROcodone-acetaminophen (NORCO/VICODIN) 5-325 MG tablet  Facet degeneration of lumbar region  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 level with radiculopathy  Chronic left-sided low back pain with left-sided sciatica - Plan: HYDROcodone-acetaminophen (NORCO/VICODIN) 5-325 MG tablet  Chronic pain syndrome  Controlled substance agreement signed - Plan: ToxASSURE Select 13 (MW), Urine  Weight gain - Plan: Bayer DCA Hb A1c Waived, TSH, T4, Free, CMP14+EGFR, CBC  Urinary incontinence, unspecified type - Plan: CBC, Urinalysis, Ambulatory referral to Urology  Essential hypertension - Plan: metoprolol succinate (TOPROL-XL) 25 MG 24 hr tablet, amLODipine (NORVASC) 5 MG tablet  Pure hypercholesterolemia - Plan: rosuvastatin (CRESTOR) 10 MG tablet  Depressive disorder - Plan: sertraline (ZOLOFT) 100 MG tablet  Norco renewed.  UDS and CSC were updated as per office policy.  Continue to use sparingly.  This will be placed on file as she is not in need of it just yet.  I will CC Dr. Ophelia Charter as  Lorain Childes that we are trying to taper her off of the trazodone so that perhaps maybe the gabapentin can be advanced to control daytime symptoms.  I did briefly discuss with the patient potential switch over to Lyrica as this may be a little less sedating than the gabapentin and of course more convenient given the potential for daily or twice daily dosing.  Glad to take this medication over if Dr. Ophelia Charter would like to defer to me going forward simply to reduce multiple office visits as she is under controlled substance contract with me already  With regards to weight gain I would look for metabolic causes but I suspect this is due to decreased mobility and physical activity.  Referral back to urology for urinary incontinence.  Urinalysis did show some trace RBCs but no actual evidence of infection.  Blood pressure is well-controlled.  Medications have been renewed.  Not due for fasting lipid.  Continue statin.  Depressive disorder is chronic and stable on Zoloft renewed  Orders Placed This Encounter  Procedures   ToxASSURE Select 13 (MW), Urine    Current Outpatient Medications:    albuterol (VENTOLIN HFA) 108 (90 Base) MCG/ACT inhaler, Inhale 2 puffs into the lungs every 6 (six) hours as needed for wheezing or shortness of breath., Disp: , Rfl:    amLODipine (NORVASC) 5 MG tablet, Take 1 tablet (5 mg total) by mouth daily., Disp: 90 tablet, Rfl: 3   cetirizine (ZYRTEC) 10 MG tablet, Take 10 mg by mouth daily., Disp: , Rfl:    cyclobenzaprine (FLEXERIL) 10 MG tablet, TAKE 1/2 TO 1 TABLET BY MOUTH 3 TIMES A DAY AS NEEDED FOR MUSCLE SPASMS, Disp: 90 tablet, Rfl: 1   EPINEPHrine (EPIPEN 2-PAK) 0.3 mg/0.3 mL IJ SOAJ injection, Inject 0.3 mg into the muscle as needed for anaphylaxis., Disp: 2 each, Rfl: 0   gabapentin (NEURONTIN) 100 MG capsule, Take 1 capsule (100 mg total) by mouth 3 (three) times daily., Disp: 90 capsule, Rfl: 1   HYDROcodone-acetaminophen (NORCO/VICODIN) 5-325 MG tablet, Take 1  tablet by mouth every 4 (four) hours as needed for severe pain (put on file)., Disp: 30 tablet, Rfl: 0   metoprolol succinate (TOPROL-XL) 25 MG 24 hr tablet, Take 1 tablet (25 mg total) by mouth daily., Disp: 90 tablet, Rfl: 3   rosuvastatin (CRESTOR) 10 MG tablet, Take 1 tablet (10 mg total) by mouth every other day., Disp: 45 tablet, Rfl: 3   sertraline (ZOLOFT) 100 MG tablet, Take 1.5 tablets (150 mg total) by mouth daily., Disp: 90 tablet, Rfl: 3   traZODone (DESYREL) 100 MG tablet, TAKE 1 TO 2 TABLETS AT     BEDTIME AS NEEDED FOR SLEEP, Disp: 180 tablet, Rfl: 3   No orders of the defined types were placed in this encounter.    Raliegh Ip, DO [facility] [phone]

## 2022-11-03 NOTE — Patient Instructions (Signed)
Wean from Trazodone. Take 100mg  at bedtime x2 weeks. Then 50mg  at bedtime x1 week, then off. Hopefully, this will allow for increased Gabapentin usage We discussed maybe switching you to Lyrica if gabapentin remains too sedating during daytime. Referral to urology placed.

## 2022-11-04 LAB — CMP14+EGFR
ALT: 13 IU/L (ref 0–32)
AST: 12 IU/L (ref 0–40)
Albumin/Globulin Ratio: 2 (ref 1.2–2.2)
Albumin: 4.3 g/dL (ref 3.9–4.9)
Alkaline Phosphatase: 104 IU/L (ref 44–121)
BUN/Creatinine Ratio: 17 (ref 12–28)
BUN: 11 mg/dL (ref 8–27)
Bilirubin Total: 0.7 mg/dL (ref 0.0–1.2)
CO2: 24 mmol/L (ref 20–29)
Calcium: 9.6 mg/dL (ref 8.7–10.3)
Chloride: 102 mmol/L (ref 96–106)
Creatinine, Ser: 0.66 mg/dL (ref 0.57–1.00)
Globulin, Total: 2.2 g/dL (ref 1.5–4.5)
Glucose: 94 mg/dL (ref 70–99)
Potassium: 5.1 mmol/L (ref 3.5–5.2)
Sodium: 140 mmol/L (ref 134–144)
Total Protein: 6.5 g/dL (ref 6.0–8.5)
eGFR: 99 mL/min/{1.73_m2} (ref >59)

## 2022-11-04 LAB — CBC
Hematocrit: 46 % (ref 34.0–46.6)
Hemoglobin: 15 g/dL (ref 11.1–15.9)
MCH: 29.8 pg (ref 26.6–33.0)
MCHC: 32.6 g/dL (ref 31.5–35.7)
MCV: 92 fL (ref 79–97)
Platelets: 247 10*3/uL (ref 150–450)
RBC: 5.03 x10E6/uL (ref 3.77–5.28)
RDW: 13.4 % (ref 11.7–15.4)
WBC: 5.5 10*3/uL (ref 3.4–10.8)

## 2022-11-04 LAB — TSH: TSH: 1.08 u[IU]/mL (ref 0.450–4.500)

## 2022-11-04 LAB — T4, FREE: Free T4: 1.4 ng/dL (ref 0.82–1.77)

## 2022-11-06 ENCOUNTER — Encounter: Payer: Self-pay | Admitting: Orthopaedic Surgery

## 2022-11-06 ENCOUNTER — Ambulatory Visit (INDEPENDENT_AMBULATORY_CARE_PROVIDER_SITE_OTHER): Payer: 59 | Admitting: Orthopaedic Surgery

## 2022-11-06 VITALS — Ht 65.0 in | Wt 239.0 lb

## 2022-11-06 DIAGNOSIS — Z96643 Presence of artificial hip joint, bilateral: Secondary | ICD-10-CM

## 2022-11-06 DIAGNOSIS — M6281 Muscle weakness (generalized): Secondary | ICD-10-CM

## 2022-11-06 NOTE — Progress Notes (Signed)
Office Visit Note   Patient: Sydney [name]           Date of Birth: 1961/05/28           MRN: [id number] Visit Date: 11/06/2022              Requested by: Raliegh Ip, DO [address] [place],  [place] [address] PCP: Raliegh Ip, DO   Assessment & Plan: Visit Diagnoses:  1. History of bilateral hip arthroplasty   2. Weakness of left quadriceps muscle     Plan: Patient needs therapy for quad strengthening on the left.  She had hip osteoarthritis.  We reviewed her MRI scan I did not see any areas of nerve compression that would cause her quad to be weak.  She did have anterior hip exposure.  Will check her back in 2 months to check her progress on quad strengthening.  Follow-Up Instructions: No follow-ups on file.   Orders:  No orders of the defined types were placed in this encounter.  No orders of the defined types were placed in this encounter.     Procedures: No procedures performed   Clinical Data: No additional findings.   Subjective: Chief Complaint  Patient presents with   Lower Back - Pain    HPI 62 year old female seen with ongoing problems with pain in her back some difficulty walking she is using a cane.  She is not taking gabapentin currently but has been on some Naprosyn.  She had previous left total hip February 21 and right total hip May 2021.  Previous history of facet rhizotomy on the right side which helped.  She had an MRI scan 02/04/2022 due to left quad weakness and left thigh numbness.  Patient did have some foraminal narrowing but at L4-5 it was worse on the right than left.  Some mild to moderate narrowing at L2-3 L3-4 L1 to some right some left nonsevere.  Review of Systems all systems updated unchanged.   Objective: Vital Signs: Ht 5\' 5"  (1.651 m)   Wt 239 lb (108.4 kg)   BMI 39.77 kg/m   Physical Exam Constitutional:      Appearance: She is well-developed.  HENT:     Head: Normocephalic.     Right Ear: External ear  normal.     Left Ear: External ear normal. There is no impacted cerumen.  Eyes:     Pupils: Pupils are equal, round, and reactive to light.  Neck:     Thyroid: No thyromegaly.     Trachea: No tracheal deviation.  Cardiovascular:     Rate and Rhythm: Normal rate.  Pulmonary:     Effort: Pulmonary effort is normal.  Abdominal:     Palpations: Abdomen is soft.  Musculoskeletal:     Cervical back: No rigidity.  Skin:    General: Skin is warm and dry.  Neurological:     Mental Status: She is alert and oriented to person, place, and time.  Psychiatric:        Behavior: Behavior normal.     Ortho Exam patient has quad weakness and cannot get up on a step without using upper extremities right and left help push self up.  Going up right foot first she has a mild half but certainly stronger right quad than left.  Specialty Comments:  EXAM: MRI LUMBAR SPINE WITHOUT CONTRAST   TECHNIQUE: Multiplanar, multisequence MR imaging of the lumbar spine was performed. No intravenous contrast was administered.  COMPARISON:  04/27/2020   FINDINGS: Segmentation:  5 lumbar-type vertebral bodies.   Alignment: Scoliosis, with dextroscoliosis of the lumbar spine. Trace retrolisthesis of T12 on L1 and L1 on L2 and trace anterolisthesis of L5 on S1, unchanged.   Vertebrae: No acute fracture or suspicious osseous lesion. Endplate degenerative changes. 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   Conus medullaris and cauda equina: Conus extends to the L1 level. Conus and cauda equina appear normal.   Paraspinal and other soft tissues: Asymmetric fatty atrophy of the superior left psoas and inferior right-greater-than-left paraspinous muscles.   Disc levels:   T12-L1: Mild disc bulge. Mild facet arthropathy. No spinal canal stenosis or neural foraminal narrowing.   L1-L2: Mild left eccentric disc bulge.  Left-greater-than-right facet arthropathy. Narrowing of the left lateral recess. No spinal canal stenosis. Mild left neural foraminal narrowing, which has progressed from the prior exam.   L2-L3: Mild disc bulge with left paracentral and subarticular protrusion which narrows the left lateral recess. Severe left and moderate right facet arthropathy. No spinal canal stenosis. Mild-to-moderate left neural foraminal narrowing which has progressed from the prior exam   L3-L4: Mild disc bulge with left foraminal and extreme lateral disc protrusion. Moderate facet arthropathy. Narrowing of the lateral recesses. No spinal canal stenosis. Moderate left neural foraminal narrowing, which has progressed from the prior exam.   L4-L5: Mild disc bulge with right foraminal and extreme lateral protrusion. Severe right and mild left facet arthropathy. Narrowing of the right lateral recess. No spinal canal stenosis. Mild right neural foraminal narrowing, unchanged.   L5-S1: Mild disc bulge. Moderate facet arthropathy. No spinal canal stenosis. Mild right neural foraminal narrowing, which has progressed from the prior exam.   IMPRESSION: 1. Progression of previously noted asymmetric degenerative changes with progressive left neural foraminal narrowing in the upper lumbar spine, which is now moderate at L3-L4, mild-to-moderate at L2-L3, and mild at L1-L2. Narrowing of the left lateral recesses at L1-L2 and L2-L3 and bilateral lateral recesses L3-L4 could affect the descending left L2 and L3 nerve roots and bilateral L4 nerve roots, respectively. 2. Mild right neural foraminal narrowing at L4-L5 and L5-S1. Narrowing of the right lateral recess at L4-L5 could affect the descending right L5 nerve roots.     Electronically Signed   By: Wiliam Ke M.D.   On: 02/05/2022 13:58  Imaging: No results found.   PMFS History: Patient Active Problem List   Diagnosis Date Noted   Weakness of left  quadriceps muscle 11/06/2022   Facet degeneration of lumbar region 02/13/2022   Peripheral vascular disease with stasis dermatitis 09/03/2021   Urine frequency 05/10/2021   Cellulitis of left leg 01/18/2021   Rash 01/18/2021   History of bilateral hip arthroplasty 01/03/2021   Upper respiratory tract infection 12/30/2020   Non-recurrent acute serous otitis media of right ear 12/30/2020   Pharyngitis 10/29/2020   Trochanteric bursitis, left hip 09/13/2020   Closed fracture of multiple pubic rami, left, sequela 08/16/2020   H/O adenomatous polyp of colon 05/23/2020   FH: colon cancer in relative diagnosed at >13 years old 05/23/2020   Impingement syndrome of right shoulder 05/03/2020   H/O total hip arthroplasty 12/06/2019   Back pain 07/01/2018   DDD (degenerative disc disease), lumbosacral 07/01/2018   Venous insufficiency of both lower extremities 11/17/2016   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 level with radiculopathy 08/18/2016   Morbid obesity with BMI of 40.0-44.9,  adult 07/28/2016   Chronic pain syndrome 10/24/2015   Dysfunction of both eustachian tubes 07/26/2014   Calculus of left kidney 04/25/2014   Impaired fasting glucose 04/05/2014   History of bladder surgery 04/03/2014   History of hysterectomy 04/03/2014   Urge incontinence 04/03/2014   Asthma 07/28/2013   Nontoxic uninodular goiter 07/28/2013   Solitary pulmonary nodule 07/28/2013   Lumbar radiculopathy 04/20/2013   Insomnia 03/22/2013   Essential hypertension 08/18/2011   Moderate episode of recurrent major depressive disorder 04/03/2011   Other B-complex deficiencies 08/16/2010   Family history of cerebrovascular accident (CVA) 04/17/2010   Family history of malignant neoplasm of gastrointestinal tract 04/17/2010   Family history of other cardiovascular diseases(V17.49) 04/17/2010   Past Medical History:  Diagnosis Date   Allergy    Anemia    history of   Anxiety    Arthritis    Asthma    Atherosclerosis     Depression    Family history of adverse reaction to anesthesia    pt's mother felt everything and couldn't speak during a gallbladder surgery   GERD (gastroesophageal reflux disease)    History of hiatal hernia    History of kidney stones    Hypertension    Pneumonia    a few times   TIA (transient ischemic attack) 2012    Family History  Problem Relation Age of Onset   Anxiety disorder Mother    Depression Mother    Heart disease Mother    Hypertension Mother    Arrhythmia Mother    Cancer Father        colon, age greater than 28   Lung cancer Father    Migraines Sister    Cancer Daughter        nerve sheath sarcoma   Alcohol abuse Brother    Breast cancer Neg Hx     Past Surgical History:  Procedure Laterality Date   ABDOMINAL HYSTERECTOMY     abdominal   APPENDECTOMY     bladder tack     CARPAL TUNNEL RELEASE Bilateral    CERVICAL SPINE SURGERY     5-6, 6-7   CHOLECYSTECTOMY     COLONOSCOPY WITH PROPOFOL N/A 07/10/2020   Procedure: COLONOSCOPY WITH PROPOFOL;  Surgeon: Lanelle Bal, DO;  Location: [facility];  Service: Endoscopy;  Laterality: N/A;  12:15pm   ENDOVENOUS ABLATION SAPHENOUS VEIN W/ LASER Left 11/28/2021   endovenous laser ablation left greater saphenous vein by Coral Else MD   HAMMER TOE SURGERY Right    JOINT REPLACEMENT     TOTAL HIP 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   Procedure: LEFT TOTAL HIP ARTHROPLASTY -DIRECT ANTERIOR;  Surgeon: Eldred Manges, MD;  Location: [facility] OR;  Service: Orthopedics;  Laterality: Left;   TOTAL HIP ARTHROPLASTY Right 12/05/2019   Procedure: RIGHT TOTAL HIP ARTHROPLASTY ANTERIOR APPROACH  DIRECT ANTERIOR;  Surgeon: Eldred Manges, MD;  Location: [facility] OR;  Service: Orthopedics;  Laterality: Right;   Social History   Occupational History   Not on file  Tobacco Use   Smoking status: Every Day    Packs/day: 0.50    Years: 23.00    Additional pack years: 0.00    Total pack years: 11.50    Types: Cigarettes    Smokeless tobacco: Never  Vaping Use   Vaping Use: Never used  Substance and Sexual Activity   Alcohol use: Yes    Comment: occ   Drug use: Never   Sexual activity: Not Currently

## 2022-11-07 LAB — TOXASSURE SELECT 13 (MW), URINE

## 2023-01-08 ENCOUNTER — Ambulatory Visit (INDEPENDENT_AMBULATORY_CARE_PROVIDER_SITE_OTHER): Payer: 59 | Admitting: Orthopaedic Surgery

## 2023-01-08 ENCOUNTER — Encounter: Payer: Self-pay | Admitting: Orthopaedic Surgery

## 2023-01-08 VITALS — Ht 65.0 in | Wt 232.0 lb

## 2023-01-08 DIAGNOSIS — M6281 Muscle weakness (generalized): Secondary | ICD-10-CM

## 2023-01-08 DIAGNOSIS — Z96643 Presence of artificial hip joint, bilateral: Secondary | ICD-10-CM | POA: Diagnosis not present

## 2023-01-08 NOTE — Progress Notes (Signed)
Office Visit Note   Patient: Sydney [name]           Date of Birth: 08/29/1960           MRN: [id number] Visit Date: 01/08/2023              Requested by: Raliegh Ip, DO [address] [place],  [place] [address] PCP: Raliegh Ip, DO   Assessment & Plan: Visit Diagnoses:  1. Weakness of left quadriceps muscle   2. History of bilateral hip arthroplasty     Plan: Continue quad strengthening then transition to the gym.  Recheck 3 months.  Follow-Up Instructions: Return in about 3 months (around 04/10/2023).   Orders:  No orders of the defined types were placed in this encounter.  No orders of the defined types were placed in this encounter.     Procedures: No procedures performed   Clinical Data: No additional findings.   Subjective: Chief Complaint  Patient presents with   Lower Back - Follow-up   Right Hip - Follow-up   Left Hip - Follow-up    HPI 62 year old female with history of right and left total hip arthroplasties with residual left greater than right quad weakness.  She had a lumbar MRI 02/05/2022 which showed up to moderate foraminal narrowing at L3-4 and mild to moderate at other levels.  She has gotten better with therapy but still needs more strengthening.  Still has a hop step much more pronounced on the left than right.  She has had some rolling pin type treatment in her upper thigh which she states has helped with some of the knots out after she does her strengthening exercises.  We discussed when she finished therapy she needs to find someplace where she can continue quad strengthening work at the gym until she gets back good strength.  Review of Systems updated unchanged   Objective: Vital Signs: Ht 5\' 5"  (1.651 m)   Wt 232 lb (105.2 kg)   BMI 38.61 kg/m   Physical Exam Constitutional:      Appearance: She is well-developed.  HENT:     Head: Normocephalic.     Right Ear: External ear normal.     Left Ear: External ear normal.  There is no impacted cerumen.  Eyes:     Pupils: Pupils are equal, round, and reactive to light.  Neck:     Thyroid: No thyromegaly.     Trachea: No tracheal deviation.  Cardiovascular:     Rate and Rhythm: Normal rate.  Pulmonary:     Effort: Pulmonary effort is normal.  Abdominal:     Palpations: Abdomen is soft.  Musculoskeletal:     Cervical back: No rigidity.  Skin:    General: Skin is warm and dry.  Neurological:     Mental Status: She is alert and oriented to person, place, and time.  Psychiatric:        Behavior: Behavior normal.     Ortho Exam patient will have left greater than right quad weakness but improved from last last exam.  Hop step for left.  She can almost to the right without having to hop to get up on the single step close still has some balance problems and still using the cane.  Incision looks good.  No knee effusion minimal crepitus with knee range of motion.  Specialty Comments:  EXAM: MRI LUMBAR SPINE WITHOUT CONTRAST   TECHNIQUE: Multiplanar, multisequence MR imaging of the lumbar spine was performed.  No intravenous contrast was administered.   COMPARISON:  04/27/2020   FINDINGS: Segmentation:  5 lumbar-type vertebral bodies.   Alignment: Scoliosis, with dextroscoliosis of the lumbar spine. Trace retrolisthesis of T12 on L1 and L1 on L2 and trace anterolisthesis of L5 on S1, unchanged.   Vertebrae: No acute fracture or suspicious osseous lesion. Endplate degenerative changes. 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   Conus medullaris and cauda equina: Conus extends to the L1 level. Conus and cauda equina appear normal.   Paraspinal and other soft tissues: Asymmetric fatty atrophy of the superior left psoas and inferior right-greater-than-left paraspinous muscles.   Disc levels:   T12-L1: Mild disc bulge. Mild facet arthropathy. No spinal canal stenosis or  neural foraminal narrowing.   L1-L2: Mild left eccentric disc bulge. Left-greater-than-right facet arthropathy. Narrowing of the left lateral recess. No spinal canal stenosis. Mild left neural foraminal narrowing, which has progressed from the prior exam.   L2-L3: Mild disc bulge with left paracentral and subarticular protrusion which narrows the left lateral recess. Severe left and moderate right facet arthropathy. No spinal canal stenosis. Mild-to-moderate left neural foraminal narrowing which has progressed from the prior exam   L3-L4: Mild disc bulge with left foraminal and extreme lateral disc protrusion. Moderate facet arthropathy. Narrowing of the lateral recesses. No spinal canal stenosis. Moderate left neural foraminal narrowing, which has progressed from the prior exam.   L4-L5: Mild disc bulge with right foraminal and extreme lateral protrusion. Severe right and mild left facet arthropathy. Narrowing of the right lateral recess. No spinal canal stenosis. Mild right neural foraminal narrowing, unchanged.   L5-S1: Mild disc bulge. Moderate facet arthropathy. No spinal canal stenosis. Mild right neural foraminal narrowing, which has progressed from the prior exam.   IMPRESSION: 1. Progression of previously noted asymmetric degenerative changes with progressive left neural foraminal narrowing in the upper lumbar spine, which is now moderate at L3-L4, mild-to-moderate at L2-L3, and mild at L1-L2. Narrowing of the left lateral recesses at L1-L2 and L2-L3 and bilateral lateral recesses L3-L4 could affect the descending left L2 and L3 nerve roots and bilateral L4 nerve roots, respectively. 2. Mild right neural foraminal narrowing at L4-L5 and L5-S1. Narrowing of the right lateral recess at L4-L5 could affect the descending right L5 nerve roots.     Electronically Signed   By: Wiliam Ke M.D.   On: 02/05/2022 13:58  Imaging: No results found.   PMFS  History: Patient Active Problem List   Diagnosis Date Noted   Weakness of left quadriceps muscle 11/06/2022   Facet degeneration of lumbar region 02/13/2022   Peripheral vascular disease with stasis dermatitis 09/03/2021   Urine frequency 05/10/2021   Cellulitis of left leg 01/18/2021   Rash 01/18/2021   History of bilateral hip arthroplasty 01/03/2021   Upper respiratory tract infection 12/30/2020   Non-recurrent acute serous otitis media of right ear 12/30/2020   Pharyngitis 10/29/2020   Trochanteric bursitis, left hip 09/13/2020   Closed fracture of multiple pubic rami, left, sequela 08/16/2020   H/O adenomatous polyp of colon 05/23/2020   FH: colon cancer in relative diagnosed at >26 years old 05/23/2020   Impingement syndrome of right shoulder 05/03/2020   H/O total hip arthroplasty 12/06/2019   Back pain 07/01/2018   DDD (degenerative disc disease), lumbosacral 07/01/2018   Venous insufficiency of both lower extremities 11/17/2016   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 level with radiculopathy 08/18/2016  Morbid obesity with BMI of 40.0-44.9, adult (HCC) 07/28/2016   Chronic pain syndrome 10/24/2015   Dysfunction of both eustachian tubes 07/26/2014   Calculus of left kidney 04/25/2014   Impaired fasting glucose 04/05/2014   History of bladder surgery 04/03/2014   History of hysterectomy 04/03/2014   Urge incontinence 04/03/2014   Asthma 07/28/2013   Nontoxic uninodular goiter 07/28/2013   Solitary pulmonary nodule 07/28/2013   Lumbar radiculopathy 04/20/2013   Insomnia 03/22/2013   Essential hypertension 08/18/2011   Moderate episode of recurrent major depressive disorder (HCC) 04/03/2011   Other B-complex deficiencies 08/16/2010   Family history of cerebrovascular accident (CVA) 04/17/2010   Family history of malignant neoplasm of gastrointestinal tract 04/17/2010   Family history of other cardiovascular diseases(V17.49) 04/17/2010   Past Medical History:  Diagnosis  Date   Allergy    Anemia    history of   Anxiety    Arthritis    Asthma    Atherosclerosis    Depression    Family history of adverse reaction to anesthesia    pt's mother felt everything and couldn't speak during a gallbladder surgery   GERD (gastroesophageal reflux disease)    History of hiatal hernia    History of kidney stones    Hypertension    Pneumonia    a few times   TIA (transient ischemic attack) 2012    Family History  Problem Relation Age of Onset   Anxiety disorder Mother    Depression Mother    Heart disease Mother    Hypertension Mother    Arrhythmia Mother    Cancer Father        colon, age greater than 71   Lung cancer Father    Migraines Sister    Cancer Daughter        nerve sheath sarcoma   Alcohol abuse Brother    Breast cancer Neg Hx     Past Surgical History:  Procedure Laterality Date   ABDOMINAL HYSTERECTOMY     abdominal   APPENDECTOMY     bladder tack     CARPAL TUNNEL RELEASE Bilateral    CERVICAL SPINE SURGERY     5-6, 6-7   CHOLECYSTECTOMY     COLONOSCOPY WITH PROPOFOL N/A 07/10/2020   Procedure: COLONOSCOPY WITH PROPOFOL;  Surgeon: Lanelle Bal, DO;  [facility];  Service: Endoscopy;  Laterality: N/A;  12:15pm   ENDOVENOUS ABLATION SAPHENOUS VEIN W/ LASER Left 11/28/2021   endovenous laser ablation left greater saphenous vein by Coral Else MD   HAMMER TOE SURGERY Right    JOINT REPLACEMENT     TOTAL HIP 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   Procedure: LEFT TOTAL HIP ARTHROPLASTY -DIRECT ANTERIOR;  Surgeon: Eldred Manges, MD;  Location: [facility];  Service: Orthopedics;  Laterality: Left;   TOTAL HIP ARTHROPLASTY Right 12/05/2019   Procedure: RIGHT TOTAL HIP ARTHROPLASTY ANTERIOR APPROACH  DIRECT ANTERIOR;  Surgeon: Eldred Manges, MD;  Location: [facility];  Service: Orthopedics;  Laterality: Right;   Social History   Occupational History   Not on file  Tobacco Use   Smoking status: Every Day    Packs/day: 0.50     Years: 23.00    Additional pack years: 0.00    Total pack years: 11.50    Types: Cigarettes   Smokeless tobacco: Never  Vaping Use   Vaping Use: Never used  Substance and Sexual Activity   Alcohol use: Yes    Comment: occ   Drug use:  Never   Sexual activity: Not Currently

## 2023-01-14 ENCOUNTER — Ambulatory Visit: Payer: 59 | Admitting: Urology

## 2023-01-14 ENCOUNTER — Encounter: Payer: Self-pay | Admitting: Urology

## 2023-01-14 VITALS — BP 131/82 | HR 71

## 2023-01-14 DIAGNOSIS — R350 Frequency of micturition: Secondary | ICD-10-CM

## 2023-01-14 DIAGNOSIS — N3941 Urge incontinence: Secondary | ICD-10-CM

## 2023-01-14 LAB — URINALYSIS, ROUTINE W REFLEX MICROSCOPIC
Bilirubin, UA: NEGATIVE
Glucose, UA: NEGATIVE
Ketones, UA: NEGATIVE
Leukocytes,UA: NEGATIVE
Nitrite, UA: NEGATIVE
Protein,UA: NEGATIVE
RBC, UA: NEGATIVE
Specific Gravity, UA: 1.02 (ref 1.005–1.030)
Urobilinogen, Ur: 0.2 mg/dL (ref 0.2–1.0)
pH, UA: 7 (ref 5.0–7.5)

## 2023-01-14 LAB — BLADDER SCAN AMB NON-IMAGING: Scan Result: 0

## 2023-01-14 MED ORDER — SOLIFENACIN SUCCINATE 5 MG PO TABS
5.0000 mg | ORAL_TABLET | Freq: Every day | ORAL | 3 refills | Status: DC
Start: 2023-01-14 — End: 2023-03-20

## 2023-01-14 NOTE — Patient Instructions (Signed)

## 2023-01-14 NOTE — Progress Notes (Signed)
01/14/2023 12:01 PM   Sydney [name] 11-24-1960 [id number]  Referring provider: Raliegh Ip, DO [address] [place],  [place] [address]  Urge incontinence   HPI:  Sydney [name] is a 62yo here for followup fo urinary incontinence and OAB. She was previously seen by Dr. Pete Glatter and was tried on gemtesa and mirabegron without any success. She uses 3 pads per day which are wet. She has rare nocturia. She has urinary frequency every hour which is usually a small volume. She has a hx of a sling in 2006. No numbness/tingling in her fingers and toes. UA is normal    Her previous records are as follows:   Sydney [name] is a 62 y.o. year old female who is seen for further evaluation of urinary incontinence.  Her symptoms have been present for several years but have recently worsened.  She reported worsening of her incontinence after a recent back injury.  She reported frequency, urgency, nocturia x2, incontinence with urgency as well as without sensory awareness.  She is using 2 mini pads per day.  She had been on tolterodine for approximately 2 months without improvement in her symptoms.  No dysuria or gross hematuria.  At the time of her visit in 10/22, she was on nitrofurantoin for a UTI.  No history of frequent UTIs.   She is status post a bladder sling in [place] a number of years ago.  She has not noticed any vaginal bulge.  She does have a history of kidney stones passing multiple stones.  Her last episode was approximately 1 year ago.  She does not have any problems with fecal incontinence.  No constipation. PVR = 65 ml. She was given a trial of Gemtesa 75 mg daily at her visit in 10/22.   She returns today for follow-up.  She noted improvement in her frequency and incontinence with the Gemtesa.  She feels like the medication made it harder for her to empty her bladder and she reported lower extremity edema.  This was despite being on Lasix 20 mg daily.  She has since run out of  the samples and has noted an improvement in her lower extremity edema.  Of note, she is being treated for lower extremity cellulitis as well.  She is not having any dysuria or gross hematuria   PMH: Past Medical History:  Diagnosis Date   Allergy    Anemia    history of   Anxiety    Arthritis    Asthma    Atherosclerosis    Depression    Family history of adverse reaction to anesthesia    pt's mother felt everything and couldn't speak during a gallbladder surgery   GERD (gastroesophageal reflux disease)    History of hiatal hernia    History of kidney stones    Hypertension    Pneumonia    a few times   TIA (transient ischemic attack) 2012    Surgical History: Past Surgical History:  Procedure Laterality Date   ABDOMINAL HYSTERECTOMY     abdominal   APPENDECTOMY     bladder tack     CARPAL TUNNEL RELEASE Bilateral    CERVICAL SPINE SURGERY     5-6, 6-7   CHOLECYSTECTOMY     COLONOSCOPY WITH PROPOFOL N/A 07/10/2020   Procedure: COLONOSCOPY WITH PROPOFOL;  Surgeon: Lanelle Bal, DO;  [facility];  Service: Endoscopy;  Laterality: N/A;  12:15pm   ENDOVENOUS ABLATION SAPHENOUS VEIN W/ LASER Left 11/28/2021  endovenous laser ablation left greater saphenous vein by Coral Else MD   HAMMER TOE SURGERY Right    JOINT REPLACEMENT     TOTAL HIP 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   Procedure: LEFT TOTAL HIP ARTHROPLASTY -DIRECT ANTERIOR;  Surgeon: Eldred Manges, MD;  Location: [facility];  Service: Orthopedics;  Laterality: Left;   TOTAL HIP ARTHROPLASTY Right 12/05/2019   Procedure: RIGHT TOTAL HIP ARTHROPLASTY ANTERIOR APPROACH  DIRECT ANTERIOR;  Surgeon: Eldred Manges, MD;  Location: [facility];  Service: Orthopedics;  Laterality: Right;    Home Medications:  Allergies as of 01/14/2023       Reactions   Bee Pollen Anaphylaxis, Swelling   Bee Venom Anaphylaxis, Swelling   Butorphanol Other (See Comments)   Not sure told by md she was allergic after surgery.    Meloxicam Anxiety   Mood disorder Altered her personality   Penicillins Anaphylaxis, Hives   Unable to Recall As child mom was told she is highly allergic Did it involve swelling of the face/tongue/throat, SOB, or low BP? Unknown Did it involve sudden or severe rash/hives, skin peeling, or any reaction on the inside of your mouth or nose? Unknown Did you need to seek medical attention at a hospital or doctor's office? Unknown When did it last happen?      childhood allergy If all above answers are "NO", may proceed with cephalosporin use.   Prochlorperazine Edisylate Anaphylaxis   Sulfa Antibiotics Anaphylaxis   Unable to Recall   Tramadol Anxiety   Didn't like the way it made her feel   Clindamycin Hives   Levaquin [levofloxacin] Itching   Topiramate Nausea And Vomiting   Doxycycline Dermatitis, Hives, Itching, Photosensitivity, Rash        Medication List        Accurate as of January 14, 2023 12:01 PM. If you have any questions, ask your nurse or doctor.          albuterol 108 (90 Base) MCG/ACT inhaler Commonly known as: VENTOLIN HFA Inhale 2 puffs into the lungs every 6 (six) hours as needed for wheezing or shortness of breath.   amLODipine 5 MG tablet Commonly known as: NORVASC Take 1 tablet (5 mg total) by mouth daily.   cetirizine 10 MG tablet Commonly known as: ZYRTEC Take 10 mg by mouth daily.   cyclobenzaprine 10 MG tablet Commonly known as: FLEXERIL TAKE 1/2 TO 1 TABLET BY MOUTH 3 TIMES A DAY AS NEEDED FOR MUSCLE SPASMS   EPINEPHrine 0.3 mg/0.3 mL Soaj injection Commonly known as: EpiPen 2-Pak Inject 0.3 mg into the muscle as needed for anaphylaxis.   HYDROcodone-acetaminophen 5-325 MG tablet Commonly known as: NORCO/VICODIN Take 1 tablet by mouth every 4 (four) hours as needed for severe pain (PUT ON FILE).   metoprolol succinate 25 MG 24 hr tablet Commonly known as: TOPROL-XL Take 1 tablet (25 mg total) by mouth daily.   rosuvastatin 10 MG  tablet Commonly known as: CRESTOR Take 1 tablet (10 mg total) by mouth every other day.   sertraline 100 MG tablet Commonly known as: ZOLOFT Take 1.5 tablets (150 mg total) by mouth daily.   traZODone 100 MG tablet Commonly known as: DESYREL TAKE 1 TO 2 TABLETS AT     BEDTIME AS NEEDED FOR SLEEP        Allergies:  Allergies  Allergen Reactions   Bee Pollen Anaphylaxis and Swelling   Bee Venom Anaphylaxis and Swelling   Butorphanol Other (See Comments)    Not sure  told by md she was allergic after surgery.    Meloxicam Anxiety    Mood disorder Altered her personality    Penicillins Anaphylaxis and Hives    Unable to Recall As child mom was told she is highly allergic Did it involve swelling of the face/tongue/throat, SOB, or low BP? Unknown Did it involve sudden or severe rash/hives, skin peeling, or any reaction on the inside of your mouth or nose? Unknown Did you need to seek medical attention at a hospital or doctor's office? Unknown When did it last happen?      childhood allergy If all above answers are "NO", may proceed with cephalosporin use.    Prochlorperazine Edisylate Anaphylaxis   Sulfa Antibiotics Anaphylaxis    Unable to Recall   Tramadol Anxiety    Didn't like the way it made her feel    Clindamycin Hives   Levaquin [Levofloxacin] Itching   Topiramate Nausea And Vomiting   Doxycycline Dermatitis, Hives, Itching, Photosensitivity and Rash    Family History: Family History  Problem Relation Age of Onset   Anxiety disorder Mother    Depression Mother    Heart disease Mother    Hypertension Mother    Arrhythmia Mother    Cancer Father        colon, age greater than 47   Lung cancer Father    Migraines Sister    Cancer Daughter        nerve sheath sarcoma   Alcohol abuse Brother    Breast cancer Neg Hx     Social History:  reports that she has been smoking cigarettes. She has a 11.50 pack-year smoking history. She has never used smokeless  tobacco. She reports current alcohol use. She reports that she does not use drugs.  ROS: All other review of systems were reviewed and are negative except what is noted above in HPI  Physical Exam: BP 131/82   Pulse 71   Constitutional:  Alert and oriented, No acute distress. HEENT: [place] AT, moist mucus membranes.  Trachea midline, no masses. Cardiovascular: No clubbing, cyanosis, or edema. Respiratory: Normal respiratory effort, no increased work of breathing. GI: Abdomen is soft, nontender, nondistended, no abdominal masses GU: No CVA tenderness.  Lymph: No cervical or inguinal lymphadenopathy. Skin: No rashes, bruises or suspicious lesions. Neurologic: Grossly intact, no focal deficits, moving all 4 extremities. Psychiatric: Normal mood and affect.  Laboratory Data: Lab Results  Component Value Date   WBC 5.5 11/03/2022   HGB 15.0 11/03/2022   HCT 46.0 11/03/2022   MCV 92 11/03/2022   PLT 247 11/03/2022    Lab Results  Component Value Date   CREATININE 0.66 11/03/2022    No results found for: "PSA"  No results found for: "TESTOSTERONE"  Lab Results  Component Value Date   HGBA1C 5.3 11/03/2022    Urinalysis    Component Value Date/Time   COLORURINE STRAW (A) 12/02/2019 1121   APPEARANCEUR Clear 11/03/2022 1455   LABSPEC 1.005 12/02/2019 1121   PHURINE 6.0 12/02/2019 1121   GLUCOSEU Negative 11/03/2022 1455   HGBUR SMALL (A) 12/02/2019 1121   BILIRUBINUR Negative 11/03/2022 1455   KETONESUR NEGATIVE 12/02/2019 1121   PROTEINUR Negative 11/03/2022 1455   PROTEINUR NEGATIVE 12/02/2019 1121   NITRITE Negative 11/03/2022 1455   NITRITE NEGATIVE 12/02/2019 1121   LEUKOCYTESUR Negative 11/03/2022 1455   LEUKOCYTESUR NEGATIVE 12/02/2019 1121    Lab Results  Component Value Date   LABMICR See below: 06/12/2021   WBCUA 0-5 06/12/2021  LABEPIT None seen 06/12/2021   MUCUS Present 06/12/2021   BACTERIA Few 06/12/2021    Pertinent Imaging:  Results for  orders placed in visit on 04/15/19  DG Abd 1 View  Narrative CLINICAL DATA:  Lower abdominal pain for the past few days.  EXAM: ABDOMEN - 1 VIEW  COMPARISON:  None.  FINDINGS: The bowel gas pattern is unremarkable. No findings for obstruction or perforation. The soft tissue shadows are grossly maintained. No worrisome calcifications. Possible upper pole and lower pole left renal calculi.  The bony structures are intact. Age advanced bilateral hip joint degenerative changes, right greater than left. There are also degenerative or posttraumatic changes involving the pubic symphysis. Scoliosis and advanced degenerative thoracolumbar spondylosis.  IMPRESSION: No plain film findings for an acute abdominal process.  Possible upper and lower pole left renal calculi.   Electronically Signed By: Rudie Meyer M.D. On: 04/15/2019 14:26  No results found for this or any previous visit.  No results found for this or any previous visit.  No results found for this or any previous visit.  No results found for this or any previous visit.  No valid procedures specified. No results found for this or any previous visit.  Results for orders placed during the hospital encounter of 04/19/19  CT Renal Stone Study  Narrative CLINICAL DATA:  62 year old female with increasing abdominal and flank pain today. Known urinary calculi.  EXAM: CT ABDOMEN AND PELVIS WITHOUT CONTRAST  TECHNIQUE: Multidetector CT imaging of the abdomen and pelvis was performed following the standard protocol without IV contrast.  COMPARISON:  None.  FINDINGS: Please note that parenchymal abnormalities may be missed without intravenous contrast.  Lower chest: No acute abnormality.  Hepatobiliary: The liver is unremarkable. Patient is status post cholecystectomy. No biliary dilatation.  Pancreas: Unremarkable  Spleen: Unremarkable  Adrenals/Urinary Tract: A 4 mm proximal LEFT ureteral  calculus causes mild LEFT hydronephrosis. At least 3 non obstructing LEFT renal calculi are identified measuring 3-5 mm.  The RIGHT kidney, adrenal glands and bladder are unremarkable.  Stomach/Bowel: Stomach is within normal limits. No evidence of bowel wall thickening, distention, or inflammatory changes.  Vascular/Lymphatic: Aortic atherosclerosis. No enlarged abdominal or pelvic lymph nodes.  Reproductive: Status post hysterectomy. No adnexal masses.  Other: No ascites, pneumoperitoneum or focal collection.  Musculoskeletal: No acute or suspicious bony abnormalities. Multilevel degenerative disc disease, spondylosis and facet arthropathy noted within the lumbar spine.  IMPRESSION: 1. 4 mm proximal LEFT ureteral calculus causing mild LEFT hydronephrosis. 2. LEFT nephrolithiasis 3.  Aortic Atherosclerosis (ICD10-I70.0).   Electronically Signed By: Harmon Pier M.D. On: 04/19/2019 20:58   Assessment & Plan:    1. Urge incontinence -We will trial vesicare 5mg  daily. If this fails to improve her OAb symptoms we will proceed with urodynamics.  - Urinalysis, Routine w reflex microscopic - BLADDER SCAN AMB NON-IMAGING  2. Urinary frequency -vesciare 5mg  daily   No follow-ups on file.  Wilkie Aye, MD  [facility] [place]

## 2023-01-14 NOTE — Progress Notes (Signed)
post void residual=0 ?

## 2023-02-02 ENCOUNTER — Other Ambulatory Visit: Payer: Self-pay

## 2023-02-02 ENCOUNTER — Emergency Department (HOSPITAL_COMMUNITY): Payer: 59

## 2023-02-02 ENCOUNTER — Inpatient Hospital Stay (HOSPITAL_COMMUNITY)
Admission: EM | Admit: 2023-02-02 | Discharge: 2023-02-04 | DRG: 322 | Disposition: A | Discharge status: Home / Self Care | Payer: 59 | Attending: Internal Medicine | Admitting: Internal Medicine

## 2023-02-02 ENCOUNTER — Telehealth: Payer: Self-pay

## 2023-02-02 ENCOUNTER — Encounter (HOSPITAL_COMMUNITY): Payer: Self-pay | Admitting: Emergency Medicine

## 2023-02-02 DIAGNOSIS — Z87442 Personal history of urinary calculi: Secondary | ICD-10-CM

## 2023-02-02 DIAGNOSIS — I25119 Atherosclerotic heart disease of native coronary artery with unspecified angina pectoris: Secondary | ICD-10-CM | POA: Diagnosis present

## 2023-02-02 DIAGNOSIS — F17210 Nicotine dependence, cigarettes, uncomplicated: Secondary | ICD-10-CM | POA: Diagnosis present

## 2023-02-02 DIAGNOSIS — F419 Anxiety disorder, unspecified: Secondary | ICD-10-CM | POA: Diagnosis present

## 2023-02-02 DIAGNOSIS — F32A Depression, unspecified: Secondary | ICD-10-CM | POA: Diagnosis present

## 2023-02-02 DIAGNOSIS — J45909 Unspecified asthma, uncomplicated: Secondary | ICD-10-CM | POA: Diagnosis present

## 2023-02-02 DIAGNOSIS — G8929 Other chronic pain: Secondary | ICD-10-CM | POA: Diagnosis present

## 2023-02-02 DIAGNOSIS — I10 Essential (primary) hypertension: Secondary | ICD-10-CM | POA: Diagnosis present

## 2023-02-02 DIAGNOSIS — K219 Gastro-esophageal reflux disease without esophagitis: Secondary | ICD-10-CM | POA: Diagnosis present

## 2023-02-02 DIAGNOSIS — E785 Hyperlipidemia, unspecified: Secondary | ICD-10-CM | POA: Diagnosis present

## 2023-02-02 DIAGNOSIS — Z955 Presence of coronary angioplasty implant and graft: Secondary | ICD-10-CM

## 2023-02-02 DIAGNOSIS — Z720 Tobacco use: Secondary | ICD-10-CM | POA: Diagnosis not present

## 2023-02-02 DIAGNOSIS — G4700 Insomnia, unspecified: Secondary | ICD-10-CM | POA: Diagnosis present

## 2023-02-02 DIAGNOSIS — R001 Bradycardia, unspecified: Secondary | ICD-10-CM | POA: Diagnosis not present

## 2023-02-02 DIAGNOSIS — Z811 Family history of alcohol abuse and dependence: Secondary | ICD-10-CM

## 2023-02-02 DIAGNOSIS — Z818 Family history of other mental and behavioral disorders: Secondary | ICD-10-CM

## 2023-02-02 DIAGNOSIS — Z91030 Bee allergy status: Secondary | ICD-10-CM

## 2023-02-02 DIAGNOSIS — I252 Old myocardial infarction: Secondary | ICD-10-CM

## 2023-02-02 DIAGNOSIS — Z8249 Family history of ischemic heart disease and other diseases of the circulatory system: Secondary | ICD-10-CM

## 2023-02-02 DIAGNOSIS — Z79899 Other long term (current) drug therapy: Secondary | ICD-10-CM

## 2023-02-02 DIAGNOSIS — Z96643 Presence of artificial hip joint, bilateral: Secondary | ICD-10-CM | POA: Diagnosis present

## 2023-02-02 DIAGNOSIS — Z8673 Personal history of transient ischemic attack (TIA), and cerebral infarction without residual deficits: Secondary | ICD-10-CM

## 2023-02-02 DIAGNOSIS — Z6838 Body mass index (BMI) 38.0-38.9, adult: Secondary | ICD-10-CM

## 2023-02-02 DIAGNOSIS — Z90710 Acquired absence of both cervix and uterus: Secondary | ICD-10-CM

## 2023-02-02 DIAGNOSIS — Z885 Allergy status to narcotic agent status: Secondary | ICD-10-CM

## 2023-02-02 DIAGNOSIS — Z801 Family history of malignant neoplasm of trachea, bronchus and lung: Secondary | ICD-10-CM

## 2023-02-02 DIAGNOSIS — Z880 Allergy status to penicillin: Secondary | ICD-10-CM

## 2023-02-02 DIAGNOSIS — Z716 Tobacco abuse counseling: Secondary | ICD-10-CM

## 2023-02-02 DIAGNOSIS — Z882 Allergy status to sulfonamides status: Secondary | ICD-10-CM

## 2023-02-02 DIAGNOSIS — I739 Peripheral vascular disease, unspecified: Secondary | ICD-10-CM | POA: Diagnosis present

## 2023-02-02 DIAGNOSIS — Z9049 Acquired absence of other specified parts of digestive tract: Secondary | ICD-10-CM

## 2023-02-02 DIAGNOSIS — Z881 Allergy status to other antibiotic agents status: Secondary | ICD-10-CM

## 2023-02-02 DIAGNOSIS — I214 Non-ST elevation (NSTEMI) myocardial infarction: Secondary | ICD-10-CM | POA: Diagnosis not present

## 2023-02-02 DIAGNOSIS — Z888 Allergy status to other drugs, medicaments and biological substances status: Secondary | ICD-10-CM

## 2023-02-02 DIAGNOSIS — E669 Obesity, unspecified: Secondary | ICD-10-CM | POA: Diagnosis present

## 2023-02-02 LAB — BASIC METABOLIC PANEL
Anion gap: 8 (ref 5–15)
BUN: 10 mg/dL (ref 8–23)
CO2: 24 mmol/L (ref 22–32)
Calcium: 8.7 mg/dL — ABNORMAL LOW (ref 8.9–10.3)
Chloride: 104 mmol/L (ref 98–111)
Creatinine, Ser: 0.58 mg/dL (ref 0.44–1.00)
GFR, Estimated: >60 mL/min (ref >60)
Glucose, Bld: 95 mg/dL (ref 70–99)
Potassium: 3.8 mmol/L (ref 3.5–5.1)
Sodium: 136 mmol/L (ref 135–145)

## 2023-02-02 LAB — CBC WITH DIFFERENTIAL/PLATELET
Abs Immature Granulocytes: 0.02 10*3/uL (ref 0.00–0.07)
Basophils Absolute: 0 10*3/uL (ref 0.0–0.1)
Basophils Relative: 1 %
Eosinophils Absolute: 0.1 10*3/uL (ref 0.0–0.5)
Eosinophils Relative: 1 %
HCT: 43.8 % (ref 36.0–46.0)
Hemoglobin: 14.2 g/dL (ref 12.0–15.0)
Immature Granulocytes: 0 %
Lymphocytes Relative: 14 %
Lymphs Abs: 0.8 10*3/uL (ref 0.7–4.0)
MCH: 29.8 pg (ref 26.0–34.0)
MCHC: 32.4 g/dL (ref 30.0–36.0)
MCV: 92 fL (ref 80.0–100.0)
Monocytes Absolute: 0.4 10*3/uL (ref 0.1–1.0)
Monocytes Relative: 7 %
Neutro Abs: 4.5 10*3/uL (ref 1.7–7.7)
Neutrophils Relative %: 77 %
Platelets: 207 10*3/uL (ref 150–400)
RBC: 4.76 MIL/uL (ref 3.87–5.11)
RDW: 13.7 % (ref 11.5–15.5)
WBC: 5.9 10*3/uL (ref 4.0–10.5)
nRBC: 0 % (ref 0.0–0.2)

## 2023-02-02 LAB — TROPONIN I (HIGH SENSITIVITY)
Troponin I (High Sensitivity): 65 ng/L — ABNORMAL HIGH (ref <18)
Troponin I (High Sensitivity): 116 ng/L (ref <18)
Troponin I (High Sensitivity): 290 ng/L (ref <18)

## 2023-02-02 LAB — HEPARIN LEVEL (UNFRACTIONATED): Heparin Unfractionated: 0.26 IU/mL — ABNORMAL LOW (ref 0.30–0.70)

## 2023-02-02 LAB — D-DIMER, QUANTITATIVE: D-Dimer, Quant: 0.63 ug/mL-FEU — ABNORMAL HIGH (ref 0.00–0.50)

## 2023-02-02 MED ORDER — SODIUM CHLORIDE 0.9 % IV SOLN: INTRAVENOUS | Status: AC

## 2023-02-02 MED ORDER — NITROGLYCERIN 0.4 MG SL SUBL
0.4000 mg | SUBLINGUAL_TABLET | SUBLINGUAL | Status: AC | PRN
  Administered 2023-02-03 (×3): 0.4 mg via SUBLINGUAL
  Filled 2023-02-02 (×3): qty 1

## 2023-02-02 MED ORDER — HEPARIN BOLUS VIA INFUSION
4000.0000 [IU] | Freq: Once | INTRAVENOUS | Status: AC
  Administered 2023-02-02: 4000 [IU] via INTRAVENOUS

## 2023-02-02 MED ORDER — POLYETHYLENE GLYCOL 3350 17 G PO PACK: 17.0000 g | PACK | Freq: Every day | ORAL | Status: DC | PRN

## 2023-02-02 MED ORDER — IOHEXOL 300 MG/ML  SOLN
75.0000 mL | Freq: Once | INTRAMUSCULAR | Status: AC | PRN
  Administered 2023-02-02: 75 mL via INTRAVENOUS

## 2023-02-02 MED ORDER — ASPIRIN 81 MG PO TBEC
81.0000 mg | DELAYED_RELEASE_TABLET | Freq: Every day | ORAL | Status: DC
  Administered 2023-02-03 – 2023-02-04 (×2): 81 mg via ORAL
  Filled 2023-02-02 (×2): qty 1

## 2023-02-02 MED ORDER — NICOTINE 7 MG/24HR TD PT24
7.0000 mg | MEDICATED_PATCH | Freq: Every day | TRANSDERMAL | Status: DC
  Filled 2023-02-02 (×4): qty 1

## 2023-02-02 MED ORDER — ACETAMINOPHEN 650 MG RE SUPP: 650.0000 mg | Freq: Four times a day (QID) | RECTAL | Status: DC | PRN

## 2023-02-02 MED ORDER — METOPROLOL SUCCINATE ER 25 MG PO TB24
25.0000 mg | ORAL_TABLET | Freq: Every day | ORAL | Status: DC
  Administered 2023-02-02: 25 mg via ORAL
  Filled 2023-02-02 (×2): qty 1

## 2023-02-02 MED ORDER — ASPIRIN 81 MG PO CHEW
324.0000 mg | CHEWABLE_TABLET | Freq: Once | ORAL | Status: AC
  Administered 2023-02-02: 324 mg via ORAL
  Filled 2023-02-02: qty 4

## 2023-02-02 MED ORDER — ONDANSETRON HCL 4 MG/2ML IJ SOLN: 4.0000 mg | Freq: Four times a day (QID) | INTRAMUSCULAR | Status: DC | PRN

## 2023-02-02 MED ORDER — ONDANSETRON HCL 4 MG/2ML IJ SOLN
4.0000 mg | Freq: Once | INTRAMUSCULAR | Status: DC
  Filled 2023-02-02: qty 2

## 2023-02-02 MED ORDER — TRAZODONE HCL 50 MG PO TABS
50.0000 mg | ORAL_TABLET | Freq: Once | ORAL | Status: AC
  Administered 2023-02-02: 50 mg via ORAL
  Filled 2023-02-02: qty 1

## 2023-02-02 MED ORDER — ROSUVASTATIN CALCIUM 5 MG PO TABS
10.0000 mg | ORAL_TABLET | ORAL | Status: DC
  Administered 2023-02-03: 10 mg via ORAL
  Filled 2023-02-02: qty 1
  Filled 2023-02-02: qty 2

## 2023-02-02 MED ORDER — ACETAMINOPHEN 325 MG PO TABS
650.0000 mg | ORAL_TABLET | Freq: Four times a day (QID) | ORAL | Status: DC | PRN
  Administered 2023-02-03 (×2): 650 mg via ORAL
  Filled 2023-02-02 (×2): qty 2

## 2023-02-02 MED ORDER — HEPARIN (PORCINE) 25000 UT/250ML-% IV SOLN
1300.0000 [IU]/h | INTRAVENOUS | Status: DC
  Administered 2023-02-02: 1150 [IU]/h via INTRAVENOUS
  Filled 2023-02-02 (×2): qty 250

## 2023-02-02 MED ORDER — ONDANSETRON HCL 4 MG PO TABS: 4.0000 mg | ORAL_TABLET | Freq: Four times a day (QID) | ORAL | Status: DC | PRN

## 2023-02-02 MED ORDER — ISOSORBIDE MONONITRATE ER 60 MG PO TB24
30.0000 mg | ORAL_TABLET | Freq: Every day | ORAL | Status: DC
  Administered 2023-02-02 – 2023-02-03 (×2): 30 mg via ORAL
  Filled 2023-02-02 (×2): qty 1

## 2023-02-02 MED ORDER — MORPHINE SULFATE (PF) 4 MG/ML IV SOLN
4.0000 mg | Freq: Once | INTRAVENOUS | Status: AC
  Administered 2023-02-02: 4 mg via INTRAVENOUS
  Filled 2023-02-02 (×2): qty 1

## 2023-02-02 NOTE — ED Notes (Signed)
Attempted to call report to [phone]. Was told "The bed hasn't been assigned to the patient yet." Despite the IP bed timer showing that the bed has been ready for 25 minutes.

## 2023-02-02 NOTE — ED Provider Notes (Signed)
Signout from Dr. Wallace Cullens.  62 year old female here with chest pain.  Troponins are rising.  Awaiting cardiology input on which campus patient should be admitted to. Physical Exam  BP 134/73   Pulse (!) 56   Temp 97.7 F (36.5 C)   Resp 18   SpO2 96%   Physical Exam  Procedures  Procedures  ED Course / MDM    Medical Decision Making Amount and/or Complexity of Data Reviewed Labs: ordered. Radiology: ordered.  Risk OTC drugs. Prescription drug management. Decision regarding hospitalization.   Patient seen by cardiology Dr. Ancil Boozer.  She is recommending any pain admission heparin beta-blocker aspirin statin and will likely go to [facility] tomorrow for further evaluation.  Full consult to follow.  [id number].  Discussed with Dr. Mariea Clonts [facility] hospitalist who will evaluate patient for admission.     Terrilee Files, MD 02/02/23 [phone]

## 2023-02-02 NOTE — ED Provider Notes (Signed)
[place] [facility] AT [facility] Provider Note   CSN: [id number] Arrival date & time: 02/02/23  1050     History  Chief Complaint  Patient presents with   Chest Pain    Sydney [name] is a 62 y.o. female.  Patient is a 62 yo female with past medical history of TIA, asthma, GERD, hypertension presenting for chest pain. Patient admits to chest pain described as heaviness that radiates to the next, intermittent, 10/10 pain severity, that awoke her from sleep x 2 days. Pt denies fevers, chills, or coughing. Denies hx of DVT or PE. Patient recently had 11 hour car drive (each way) to [place] on July 7th-11th.   No previous history of myocardial infarction's  The history is provided by the patient. No language interpreter was used.  Chest Pain Associated symptoms: no abdominal pain, no back pain, no cough, no fever, no palpitations, no shortness of breath and no vomiting        Home Medications Prior to Admission medications   Medication Sig Start Date End Date Taking? Authorizing Provider  albuterol (VENTOLIN HFA) 108 (90 Base) MCG/ACT inhaler Inhale 2 puffs into the lungs every 6 (six) hours as needed for wheezing or shortness of breath.   Yes [provider]  amLODipine (NORVASC) 5 MG tablet Take 1 tablet (5 mg total) by mouth daily. 11/03/22  Yes Gottschalk, Kathie Rhodes M, DO  cetirizine (ZYRTEC) 10 MG tablet Take 10 mg by mouth daily.   Yes [provider]  cyclobenzaprine (FLEXERIL) 10 MG tablet TAKE 1/2 TO 1 TABLET BY MOUTH 3 TIMES A DAY AS NEEDED FOR MUSCLE SPASMS 02/04/22  Yes Gottschalk, Ashly M, DO  EPINEPHrine (EPIPEN 2-PAK) 0.3 mg/0.3 mL IJ SOAJ injection Inject 0.3 mg into the muscle as needed for anaphylaxis. 03/05/22  Yes Delynn Flavin M, DO  HYDROcodone-acetaminophen (NORCO/VICODIN) 5-325 MG tablet Take 1 tablet by mouth every 4 (four) hours as needed for severe pain (PUT ON FILE). 11/03/22  Yes Delynn Flavin M, DO  metoprolol  succinate (TOPROL-XL) 25 MG 24 hr tablet Take 1 tablet (25 mg total) by mouth daily. 11/03/22  Yes Gottschalk, Kathie Rhodes M, DO  rosuvastatin (CRESTOR) 10 MG tablet Take 1 tablet (10 mg total) by mouth every other day. 11/03/22  Yes Delynn Flavin M, DO  sertraline (ZOLOFT) 100 MG tablet Take 1.5 tablets (150 mg total) by mouth daily. 11/03/22  Yes Delynn Flavin M, DO  solifenacin (VESICARE) 5 MG tablet Take 1 tablet (5 mg total) by mouth daily. 01/14/23  Yes McKenzie, Mardene Celeste, MD  traZODone (DESYREL) 100 MG tablet TAKE 1 TO 2 TABLETS AT     BEDTIME AS NEEDED FOR SLEEP 04/04/22  Yes Delynn Flavin M, DO      Allergies    Bee pollen, Bee venom, Butorphanol, Meloxicam, Penicillins, Prochlorperazine edisylate, Sulfa antibiotics, Tramadol, Clindamycin, Levaquin [levofloxacin], Topiramate, and Doxycycline    Review of Systems   Review of Systems  Constitutional:  Negative for chills and fever.  HENT:  Negative for ear pain and sore throat.   Eyes:  Negative for pain and visual disturbance.  Respiratory:  Negative for cough and shortness of breath.   Cardiovascular:  Positive for chest pain. Negative for palpitations.  Gastrointestinal:  Negative for abdominal pain and vomiting.  Genitourinary:  Negative for dysuria and hematuria.  Musculoskeletal:  Negative for arthralgias and back pain.  Skin:  Negative for color change and rash.  Neurological:  Negative for seizures and syncope.  All  other systems reviewed and are negative.   Physical Exam Updated Vital Signs BP 123/66 (BP Location: Right Arm)   Pulse (!) 54   Temp 98.5 F (36.9 C)   Resp 18   SpO2 95%  Physical Exam Vitals and nursing note reviewed.  Constitutional:      General: She is not in acute distress.    Appearance: She is well-developed.  HENT:     Head: Normocephalic and atraumatic.  Eyes:     Conjunctiva/sclera: Conjunctivae normal.  Cardiovascular:     Rate and Rhythm: Normal rate and regular rhythm.     Heart  sounds: No murmur heard. Pulmonary:     Effort: Pulmonary effort is normal. No respiratory distress.     Breath sounds: Normal breath sounds.  Abdominal:     Palpations: Abdomen is soft.     Tenderness: There is no abdominal tenderness.  Musculoskeletal:        General: No swelling.     Cervical back: Neck supple.  Skin:    General: Skin is warm and dry.     Capillary Refill: Capillary refill takes less than 2 seconds.  Neurological:     Mental Status: She is alert.  Psychiatric:        Mood and Affect: Mood normal.     ED Results / Procedures / Treatments   Labs (all labs ordered are listed, but only abnormal results are displayed) Labs Reviewed  BASIC METABOLIC PANEL - Abnormal; Notable for the following components:      Result Value   Calcium 8.7 (*)    All other components within normal limits  D-DIMER, QUANTITATIVE - Abnormal; Notable for the following components:   D-Dimer, Quant 0.63 (*)    All other components within normal limits  TROPONIN I (HIGH SENSITIVITY) - Abnormal; Notable for the following components:   Troponin I (High Sensitivity) 65 (*)    All other components within normal limits  TROPONIN I (HIGH SENSITIVITY) - Abnormal; Notable for the following components:   Troponin I (High Sensitivity) 116 (*)    All other components within normal limits  CBC WITH DIFFERENTIAL/PLATELET    EKG EKG Interpretation Date/Time:  Monday February 02 2023 11:30:04 EDT Ventricular Rate:  50 PR Interval:  146 QRS Duration:  106 QT Interval:  440 QTC Calculation: 402 R Axis:   27  Text Interpretation: Sinus rhythm Abnormal R-wave progression, early transition Confirmed by Edwin Dada ([id number]) on 02/02/2023 3:28:28 PM  Radiology CT Angio Chest PE W and/or Wo Contrast  Result Date: 02/02/2023 CLINICAL DATA:  Left-sided chest pain and shortness of breath. EXAM: CT ANGIOGRAPHY CHEST WITH CONTRAST TECHNIQUE: Multidetector CT imaging of the chest was performed using the  standard protocol during bolus administration of intravenous contrast. Multiplanar CT image reconstructions and MIPs were obtained to evaluate the vascular anatomy. RADIATION DOSE REDUCTION: This exam was performed according to the departmental dose-optimization program which includes automated exposure control, adjustment of the mA and/or kV according to patient size and/or use of iterative reconstruction technique. CONTRAST:  75mL OMNIPAQUE IOHEXOL 300 MG/ML  SOLN COMPARISON:  None Available. FINDINGS: Cardiovascular: Satisfactory opacification of the pulmonary arteries to the segmental level. No evidence of pulmonary embolism. Normal heart size. No pericardial effusion. Calcific atherosclerotic disease of the coronary arteries. Mediastinum/Nodes: No enlarged mediastinal, hilar, or axillary lymph nodes. Thyroid gland, trachea, and esophagus demonstrate no significant findings. Lungs/Pleura: Lungs are clear. No pleural effusion or pneumothorax. Upper Abdomen: No acute abnormality. Musculoskeletal: Kyphosis and spondylosis  of the thoracic spine. Review of the MIP images confirms the above findings. IMPRESSION: 1. No evidence of pulmonary embolism. 2. Calcific atherosclerotic disease of the coronary arteries. 3. Aortic atherosclerosis. Aortic Atherosclerosis (ICD10-I70.0). Electronically Signed   By: Ted Mcalpine M.D.   On: 02/02/2023 14:43   DG Chest Portable 1 View  Result Date: 02/02/2023 CLINICAL DATA:  Chest pain EXAM: PORTABLE CHEST 1 VIEW COMPARISON:  Chest radiograph 12/02/2019 FINDINGS: The heart is at the upper limits of normal for size. The upper mediastinal contours are normal. There is no focal consolidation or pulmonary edema. There is no pleural effusion or pneumothorax There is no acute osseous abnormality. Cervical spine fusion hardware is noted. IMPRESSION: No radiographic evidence of acute cardiopulmonary process. Electronically Signed   By: Lesia Hausen M.D.   On: 02/02/2023 12:47     Procedures Procedures    Medications Ordered in ED Medications  aspirin chewable tablet 324 mg (has no administration in time range)  morphine (PF) 4 MG/ML injection 4 mg (has no administration in time range)  ondansetron (ZOFRAN) injection 4 mg (has no administration in time range)  iohexol (OMNIPAQUE) 300 MG/ML solution 75 mL (75 mLs Intravenous Contrast Given 02/02/23 1416)    ED Course/ Medical Decision Making/ A&P             HEART Score: 6                Medical Decision Making Amount and/or Complexity of Data Reviewed Labs: ordered. Radiology: ordered.  Risk OTC drugs. Prescription drug management. Decision regarding hospitalization.   3:39 PM 62 yo female presenting for chest pain. Patient is alert and oriented x 3, no acute distress, afebrile, stable vital signs.  Physical exam demonstrates equal bilateral breath sounds bilaterally with no adventitious lung sounds.  EKG interpreted by myself demonstrates sinus rhythm.  No ST segment elevation or depression.  No T wave inversions.  Normal intervals.  Initial troponin 65.  Repeat troponin 116.  Continues to have chest pain with radiation to the neck at this time.  Aspirin and morphine given.  Dimer ordered due to patient's recent travel history and exertional shortness of breath with chest pain.  D-dimer elevated.  CT PE demonstrates no pulmonary embolism. HEART Score: 6.  Recommendations for for NSTEMI.  Patient agreeable to plan.  Spoke with on-call cardiology who agrees to come see patient.  Recommendations to admit to hospitalist medicine however will go see patient before they decide whether or not the patient needs to go to [facility].        Final Clinical Impression(s) / ED Diagnoses Final diagnoses:  NSTEMI (non-ST elevated myocardial infarction) [facility])    Rx / DC Orders ED Discharge Orders     None         Franne Forts, DO 02/02/23 1540

## 2023-02-02 NOTE — ED Notes (Signed)
ED TO INPATIENT HANDOFF REPORT  ED Nurse Name and Phone #: Wandra Mannan, Paramedic [phone]  S Name/Age/Gender Sydney [name] 62 y.o. female Room/Bed: APA07/APA07  Code Status   Code Status: Prior  Home/SNF/Other Home Patient oriented to: self, place, time, and situation Is this baseline? Yes   Triage Complete: Triage complete  Chief Complaint NSTEMI (non-ST elevated myocardial infarction) [facility]) [I21.4]  Triage Note Pt reports chest pain that woke her up from sleep. PT states the pain is radiating to her left neck and jaw. PT also reports exertional SHOB. PT states she took an 11hr car ride recently.    Allergies Allergies  Allergen Reactions   Bee Pollen Anaphylaxis and Swelling   Bee Venom Anaphylaxis and Swelling   Butorphanol Other (See Comments)    Not sure told by md she was allergic after surgery.    Meloxicam Anxiety    Mood disorder Altered her personality    Penicillins Anaphylaxis and Hives    Unable to Recall As child mom was told she is highly allergic Did it involve swelling of the face/tongue/throat, SOB, or low BP? Unknown Did it involve sudden or severe rash/hives, skin peeling, or any reaction on the inside of your mouth or nose? Unknown Did you need to seek medical attention at a hospital or doctor's office? Unknown When did it last happen?      childhood allergy If all above answers are "NO", may proceed with cephalosporin use.    Prochlorperazine Edisylate Anaphylaxis   Sulfa Antibiotics Anaphylaxis    Unable to Recall   Tramadol Anxiety    Didn't like the way it made her feel    Clindamycin Hives   Levaquin [Levofloxacin] Itching   Topiramate Nausea And Vomiting   Doxycycline Dermatitis, Hives, Itching, Photosensitivity and Rash    Level of Care/Admitting Diagnosis ED Disposition     ED Disposition  Admit   Condition  --   Comment  Hospital Area: [facility] [[id number]]  Level of Care: Telemetry [5]  Covid  Evaluation: Asymptomatic - no recent exposure (last 10 days) testing not required  Diagnosis: NSTEMI (non-ST elevated myocardial infarction) [facility]) [[id number]]  Admitting Physician: Onnie Boer [phone]  Attending Physician: Onnie Boer [email or web address]          B Medical/Surgery History Past Medical History:  Diagnosis Date   Allergy    Anemia    history of   Anxiety    Arthritis    Asthma    Atherosclerosis    Depression    Family history of adverse reaction to anesthesia    pt's mother felt everything and couldn't speak during a gallbladder surgery   GERD (gastroesophageal reflux disease)    History of hiatal hernia    History of kidney stones    Hypertension    Pneumonia    a few times   TIA (transient ischemic attack) 2012   Past Surgical History:  Procedure Laterality Date   ABDOMINAL HYSTERECTOMY     abdominal   APPENDECTOMY     bladder tack     CARPAL TUNNEL RELEASE Bilateral    CERVICAL SPINE SURGERY     5-6, 6-7   CHOLECYSTECTOMY     COLONOSCOPY WITH PROPOFOL N/A 07/10/2020   Procedure: COLONOSCOPY WITH PROPOFOL;  Surgeon: Lanelle Bal, DO;  Location: [facility]ITE;  Service: Endoscopy;  Laterality: N/A;  12:15pm   ENDOVENOUS ABLATION SAPHENOUS VEIN W/ LASER Left 11/28/2021   endovenous laser ablation left greater saphenous vein  by Coral Else MD   HAMMER TOE SURGERY Right    JOINT REPLACEMENT     TOTAL HIP 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   Procedure: LEFT TOTAL HIP ARTHROPLASTY -DIRECT ANTERIOR;  Surgeon: Eldred Manges, MD;  Location: [facility] OR;  Service: Orthopedics;  Laterality: Left;   TOTAL HIP ARTHROPLASTY Right 12/05/2019   Procedure: RIGHT TOTAL HIP ARTHROPLASTY ANTERIOR APPROACH  DIRECT ANTERIOR;  Surgeon: Eldred Manges, MD;  Location: [facility] OR;  Service: Orthopedics;  Laterality: Right;     A IV Location/Drains/Wounds Patient Lines/Drains/Airways Status     Active Line/Drains/Airways     Name Placement date Placement time Site Days    Peripheral IV 02/02/23 20 G 1" Anterior;Left;Proximal Forearm 02/02/23  1331  Forearm  less than 1   Incision (Closed) 08/31/19 Hip Left 08/31/19  1456  -- 1251   Incision (Closed) 12/05/19 Hip Right 12/05/19  1600  -- 1155            Intake/Output Last 24 hours No intake or output data in the 24 hours ending 02/02/23 1708  Labs/Imaging Results for orders placed or performed during the hospital encounter of 02/02/23 (from the past 48 hour(s))  CBC with Differential     Status: None   Collection Time: 02/02/23 11:53 AM  Result Value Ref Range   WBC 5.9 4.0 - 10.5 K/uL   RBC 4.76 3.87 - 5.11 MIL/uL   Hemoglobin 14.2 12.0 - 15.0 g/dL   HCT 69.6 29.5 - 28.4 %   MCV 92.0 80.0 - 100.0 fL   MCH 29.8 26.0 - 34.0 pg   MCHC 32.4 30.0 - 36.0 g/dL   RDW 13.2 44.0 - 10.2 %   Platelets 207 150 - 400 K/uL   nRBC 0.0 0.0 - 0.2 %   Neutrophils Relative % 77 %   Neutro Abs 4.5 1.7 - 7.7 K/uL   Lymphocytes Relative 14 %   Lymphs Abs 0.8 0.7 - 4.0 K/uL   Monocytes Relative 7 %   Monocytes Absolute 0.4 0.1 - 1.0 K/uL   Eosinophils Relative 1 %   Eosinophils Absolute 0.1 0.0 - 0.5 K/uL   Basophils Relative 1 %   Basophils Absolute 0.0 0.0 - 0.1 K/uL   Immature Granulocytes 0 %   Abs Immature Granulocytes 0.02 0.00 - 0.07 K/uL    Comment: Performed at [facility], [address], [place], [place] [address]  Basic metabolic panel     Status: Abnormal   Collection Time: 02/02/23 11:53 AM  Result Value Ref Range   Sodium 136 135 - 145 mmol/L   Potassium 3.8 3.5 - 5.1 mmol/L   Chloride 104 98 - 111 mmol/L   CO2 24 22 - 32 mmol/L   Glucose, Bld 95 70 - 99 mg/dL    Comment: Glucose reference range applies only to samples taken after fasting for at least 8 hours.   BUN 10 8 - 23 mg/dL   Creatinine, Ser 6.44 0.44 - 1.00 mg/dL   Calcium 8.7 (L) 8.9 - 10.3 mg/dL   GFR, Estimated >03 >47 mL/min    Comment: (NOTE) Calculated using the CKD-EPI Creatinine Equation (2021)    Anion gap 8 5 - 15     Comment: Performed at [facility], [address] [place], [place] [address]  D-dimer, quantitative     Status: Abnormal   Collection Time: 02/02/23 11:53 AM  Result Value Ref Range   D-Dimer, Quant 0.63 (H) 0.00 - 0.50 ug/mL-FEU    Comment: (NOTE)  At the manufacturer cut-off value of 0.5 g/mL FEU, this assay has a negative predictive value of 95-100%.This assay is intended for use in conjunction with a clinical pretest probability (PTP) assessment model to exclude pulmonary embolism (PE) and deep venous thrombosis (DVT) in outpatients suspected of PE or DVT. Results should be correlated with clinical presentation. Performed at [facility], [address], [place], [place] [address]   Troponin I (High Sensitivity)     Status: Abnormal   Collection Time: 02/02/23 11:53 AM  Result Value Ref Range   Troponin I (High Sensitivity) 65 (H) <18 ng/L    Comment: (NOTE) Elevated high sensitivity troponin I (hsTnI) values and significant  changes across serial measurements may suggest ACS but many other  chronic and acute conditions are known to elevate hsTnI results.  Refer to the "Links" section for chest pain algorithms and additional  guidance. Performed at [facility], [address] [place], [place] [address]   Troponin I (High Sensitivity)     Status: Abnormal   Collection Time: 02/02/23  1:30 PM  Result Value Ref Range   Troponin I (High Sensitivity) 116 (HH) <18 ng/L    Comment: DELTA CHECK NOTED CRITICAL RESULT CALLED TO, READ BACK BY AND VERIFIED WITH MICHAEL DOSS @ 1417 ON 02/02/23 C VARNER (NOTE) Elevated high sensitivity troponin I (hsTnI) values and significant  changes across serial measurements may suggest ACS but many other  chronic and acute conditions are known to elevate hsTnI results.  Refer to the "Links" section for chest pain algorithms and additional  guidance. Performed at [facility], [address], [place], [place] [address]    CT Angio Chest PE W and/or  Wo Contrast  Result Date: 02/02/2023 CLINICAL DATA:  Left-sided chest pain and shortness of breath. EXAM: CT ANGIOGRAPHY CHEST WITH CONTRAST TECHNIQUE: Multidetector CT imaging of the chest was performed using the standard protocol during bolus administration of intravenous contrast. Multiplanar CT image reconstructions and MIPs were obtained to evaluate the vascular anatomy. RADIATION DOSE REDUCTION: This exam was performed according to the departmental dose-optimization program which includes automated exposure control, adjustment of the mA and/or kV according to patient size and/or use of iterative reconstruction technique. CONTRAST:  75mL OMNIPAQUE IOHEXOL 300 MG/ML  SOLN COMPARISON:  None Available. FINDINGS: Cardiovascular: Satisfactory opacification of the pulmonary arteries to the segmental level. No evidence of pulmonary embolism. Normal heart size. No pericardial effusion. Calcific atherosclerotic disease of the coronary arteries. Mediastinum/Nodes: No enlarged mediastinal, hilar, or axillary lymph nodes. Thyroid gland, trachea, and esophagus demonstrate no significant findings. Lungs/Pleura: Lungs are clear. No pleural effusion or pneumothorax. Upper Abdomen: No acute abnormality. Musculoskeletal: Kyphosis and spondylosis of the thoracic spine. Review of the MIP images confirms the above findings. IMPRESSION: 1. No evidence of pulmonary embolism. 2. Calcific atherosclerotic disease of the coronary arteries. 3. Aortic atherosclerosis. Aortic Atherosclerosis (ICD10-I70.0). Electronically Signed   By: Ted Mcalpine M.D.   On: 02/02/2023 14:43   DG Chest Portable 1 View  Result Date: 02/02/2023 CLINICAL DATA:  Chest pain EXAM: PORTABLE CHEST 1 VIEW COMPARISON:  Chest radiograph 12/02/2019 FINDINGS: The heart is at the upper limits of normal for size. The upper mediastinal contours are normal. There is no focal consolidation or pulmonary edema. There is no pleural effusion or pneumothorax There is  no acute osseous abnormality. Cervical spine fusion hardware is noted. IMPRESSION: No radiographic evidence of acute cardiopulmonary process. Electronically Signed   By: Lesia Hausen M.D.   On: 02/02/2023 12:47  Pending Labs Unresulted Labs (From admission, onward)     Start     Ordered   02/03/23 0500  Heparin level (unfractionated)  Daily,   R      02/02/23 1642   02/03/23 0500  CBC  Daily,   R      02/02/23 1642   02/02/23 2300  Heparin level (unfractionated)  Once-Timed,   URGENT        02/02/23 1642            Vitals/Pain Today's Vitals   02/02/23 1345 02/02/23 1542 02/02/23 1544 02/02/23 1615  BP: 123/66 134/73  (!) 143/74  Pulse: (!) 54 (!) 56  (!) 54  Resp: 18     Temp:   97.7 F (36.5 C)   SpO2: 95% 96%  97%  Weight:    232 lb (105.2 kg)  Height:    5\' 5"  (1.651 m)    Isolation Precautions No active isolations  Medications Medications  morphine (PF) 4 MG/ML injection 4 mg (0 mg Intravenous Hold 02/02/23 1540)  ondansetron (ZOFRAN) injection 4 mg (0 mg Intravenous Hold 02/02/23 1540)  heparin ADULT infusion 100 units/mL (25000 units/214mL) (1,150 Units/hr Intravenous New Bag/Given 02/02/23 1646)  iohexol (OMNIPAQUE) 300 MG/ML solution 75 mL (75 mLs Intravenous Contrast Given 02/02/23 1416)  aspirin chewable tablet 324 mg (324 mg Oral Given 02/02/23 1542)  heparin bolus via infusion 4,000 Units (4,000 Units Intravenous Bolus from Bag 02/02/23 1647)    Mobility walks     Focused Assessments Cardiac Assessment Handoff:  Cardiac Rhythm: Normal sinus rhythm No results found for: "CKTOTAL", "CKMB", "CKMBINDEX", "TROPONINI" Lab Results  Component Value Date   DDIMER 0.63 (H) 02/02/2023   Does the Patient currently have chest pain? Yes    R Recommendations: See Admitting Provider Note  Report given to:   Additional Notes: NSTEMI. Chest pain currently radiating to back in between shoulder blades 2-10. Trop 1 - 65 Trop 2 - 116. 20 ga LAC with Heparin  running.

## 2023-02-02 NOTE — Progress Notes (Signed)
ANTICOAGULATION CONSULT NOTE   Pharmacy Consult for heparin Indication: chest pain/ACS  Allergies  Allergen Reactions   Bee Pollen Anaphylaxis and Swelling   Bee Venom Anaphylaxis and Swelling   Butorphanol Other (See Comments)    Not sure told by md she was allergic after surgery.    Meloxicam Anxiety    Mood disorder Altered her personality    Penicillins Anaphylaxis and Hives    Unable to Recall As child mom was told she is highly allergic Did it involve swelling of the face/tongue/throat, SOB, or low BP? Unknown Did it involve sudden or severe rash/hives, skin peeling, or any reaction on the inside of your mouth or nose? Unknown Did you need to seek medical attention at a hospital or doctor's office? Unknown When did it last happen?      childhood allergy If all above answers are "NO", may proceed with cephalosporin use.    Prochlorperazine Edisylate Anaphylaxis   Sulfa Antibiotics Anaphylaxis    Unable to Recall   Tramadol Anxiety    Didn't like the way it made her feel    Clindamycin Hives   Levaquin [Levofloxacin] Itching   Topiramate Nausea And Vomiting   Doxycycline Dermatitis, Hives, Itching, Photosensitivity and Rash    Patient Measurements: Height: 5\' 5"  (165.1 cm) Weight: 105.2 kg (232 lb) IBW/kg (Calculated) : 57 Heparin Dosing Weight: 81.4 kg  Vital Signs: Temp: 98.8 F (37.1 C) (07/15 2152) Temp Source: Oral (07/15 2152) BP: 126/59 (07/15 2152) Pulse Rate: 59 (07/15 2152)  Labs: Recent Labs    02/02/23 1153 02/02/23 1330 02/02/23 1845 02/02/23 2313  HGB 14.2  --   --   --   HCT 43.8  --   --   --   PLT 207  --   --   --   HEPARINUNFRC  --   --   --  0.26*  CREATININE 0.58  --   --   --   TROPONINIHS 65* 116* 290*  --     Estimated Creatinine Clearance: 86.7 mL/min (by C-G formula based on SCr of 0.58 mg/dL).   Medical History: Past Medical History:  Diagnosis Date   Allergy    Anemia    history of   Anxiety    Arthritis     Asthma    Atherosclerosis    Depression    Family history of adverse reaction to anesthesia    pt's mother felt everything and couldn't speak during a gallbladder surgery   GERD (gastroesophageal reflux disease)    History of hiatal hernia    History of kidney stones    Hypertension    Pneumonia    a few times   TIA (transient ischemic attack) 2012    Medications:  Medications Prior to Admission  Medication Sig Dispense Refill Last Dose   albuterol (VENTOLIN HFA) 108 (90 Base) MCG/ACT inhaler Inhale 2 puffs into the lungs every 6 (six) hours as needed for wheezing or shortness of breath.      amLODipine (NORVASC) 5 MG tablet Take 1 tablet (5 mg total) by mouth daily. 90 tablet 3 02/01/2023   cetirizine (ZYRTEC) 10 MG tablet Take 10 mg by mouth daily.   02/01/2023   cyclobenzaprine (FLEXERIL) 10 MG tablet TAKE 1/2 TO 1 TABLET BY MOUTH 3 TIMES A DAY AS NEEDED FOR MUSCLE SPASMS 90 tablet 1    EPINEPHrine (EPIPEN 2-PAK) 0.3 mg/0.3 mL IJ SOAJ injection Inject 0.3 mg into the muscle as needed for anaphylaxis. 2 each  0    HYDROcodone-acetaminophen (NORCO/VICODIN) 5-325 MG tablet Take 1 tablet by mouth every 4 (four) hours as needed for severe pain (PUT ON FILE). 30 tablet 0 Past Month   metoprolol succinate (TOPROL-XL) 25 MG 24 hr tablet Take 1 tablet (25 mg total) by mouth daily. 90 tablet 3 02/01/2023   rosuvastatin (CRESTOR) 10 MG tablet Take 1 tablet (10 mg total) by mouth every other day. 45 tablet 3 02/01/2023   sertraline (ZOLOFT) 100 MG tablet Take 1.5 tablets (150 mg total) by mouth daily. 90 tablet 3 02/01/2023   solifenacin (VESICARE) 5 MG tablet Take 1 tablet (5 mg total) by mouth daily. 30 tablet 3 02/01/2023   traZODone (DESYREL) 100 MG tablet TAKE 1 TO 2 TABLETS AT     BEDTIME AS NEEDED FOR SLEEP 180 tablet 3 02/01/2023    Assessment: Pharmacy consulted to dose heparin in patient with chest pain/ACS.  Patient is not on anticoagulation prior to admission.    CBC WNL Trop 116  7/15  PM update:  Heparin level sub-therapeutic   Goal of Therapy:  Heparin level 0.3-0.7 units/ml Monitor platelets by anticoagulation protocol: Yes   Plan:  Inc heparin to 1300 units/hr Re-check heparin level in 6-8 hours  Abran Duke, PharmD, BCPS Clinical Pharmacist Phone: [phone]

## 2023-02-02 NOTE — Telephone Encounter (Signed)
Patient calls with complaint of chest pain radiating into left jaw, elevated BP of 165/99 and 148/95.  Pain began two days ago but woke her up during night it was so intense.  I advised patient she needed to hang up and call 911 for EMS to come out to evaluate for possible heart problems.  Patient voiced understanding and stated she would.

## 2023-02-02 NOTE — Assessment & Plan Note (Addendum)
Presenting with chest pains.  Troponin 65 >> 116 >> 290.  EKG with sinus rhythm, unchanged from prior.  Smokes cigarettes.  History of CVA/TIA.  Recent 11 road trip, D-dimer 0.63, CTA chest negative for PE. -Evaluated by cardiology-admitted here by hospitalist team, would benefit from transfer to [facility] for left heart cath, ACS protocol to include aspirin load followed by 81 mg once daily, heparin drip, high intensity statin, start Imdur 30 mg once daily. -EKG in a.m. -N.p.o. midnight - N/s 100cc/hr x 10hrs

## 2023-02-02 NOTE — Progress Notes (Signed)
ANTICOAGULATION CONSULT NOTE - Initial Consult  Pharmacy Consult for heparin Indication: chest pain/ACS  Allergies  Allergen Reactions   Bee Pollen Anaphylaxis and Swelling   Bee Venom Anaphylaxis and Swelling   Butorphanol Other (See Comments)    Not sure told by md she was allergic after surgery.    Meloxicam Anxiety    Mood disorder Altered her personality    Penicillins Anaphylaxis and Hives    Unable to Recall As child mom was told she is highly allergic Did it involve swelling of the face/tongue/throat, SOB, or low BP? Unknown Did it involve sudden or severe rash/hives, skin peeling, or any reaction on the inside of your mouth or nose? Unknown Did you need to seek medical attention at a hospital or doctor's office? Unknown When did it last happen?      childhood allergy If all above answers are "NO", may proceed with cephalosporin use.    Prochlorperazine Edisylate Anaphylaxis   Sulfa Antibiotics Anaphylaxis    Unable to Recall   Tramadol Anxiety    Didn't like the way it made her feel    Clindamycin Hives   Levaquin [Levofloxacin] Itching   Topiramate Nausea And Vomiting   Doxycycline Dermatitis, Hives, Itching, Photosensitivity and Rash    Patient Measurements:   Heparin Dosing Weight: 81.4 kg  Vital Signs: Temp: 97.7 F (36.5 C) (07/15 1544) BP: 143/74 (07/15 1615) Pulse Rate: 54 (07/15 1615)  Labs: Recent Labs    02/02/23 1153 02/02/23 1330  HGB 14.2  --   HCT 43.8  --   PLT 207  --   CREATININE 0.58  --   TROPONINIHS 65* 116*    CrCl cannot be calculated (Unknown ideal weight.).   Medical History: Past Medical History:  Diagnosis Date   Allergy    Anemia    history of   Anxiety    Arthritis    Asthma    Atherosclerosis    Depression    Family history of adverse reaction to anesthesia    pt's mother felt everything and couldn't speak during a gallbladder surgery   GERD (gastroesophageal reflux disease)    History of hiatal hernia     History of kidney stones    Hypertension    Pneumonia    a few times   TIA (transient ischemic attack) 2012    Medications:  (Not in a hospital admission)   Assessment: Pharmacy consulted to dose heparin in patient with chest pain/ACS.  Patient is not on anticoagulation prior to admission.    CBC WNL Trop 116  Goal of Therapy:  Heparin level 0.3-0.7 units/ml Monitor platelets by anticoagulation protocol: Yes   Plan:  Give 4000 units bolus x 1 Start heparin infusion at 1150 units/hr Check anti-Xa level in 6 hours and daily while on heparin Continue to monitor H&H and platelets  Tad Moore 02/02/2023,4:34 PM

## 2023-02-02 NOTE — ED Triage Notes (Addendum)
Pt reports chest pain that woke her up from sleep. PT states the pain is radiating to her left neck and jaw. PT also reports exertional SHOB. PT states she took an 11hr car ride recently.

## 2023-02-02 NOTE — Assessment & Plan Note (Signed)
Smokes half a pack of cigarettes daily.   - Counseled to quit smoking.  Patient voiced understanding -Nicotine patch

## 2023-02-02 NOTE — ED Notes (Signed)
Date and time results received: 02/02/23 1418 (use smartphrase ".now" to insert current time)  Test: Trop Critical Value: 116  Name of Provider Notified: Dr. Wallace Cullens  Orders Received? Or Actions Taken?: see chart

## 2023-02-02 NOTE — H&P (Addendum)
History and Physical    Zoanne Newill KGU:[id number] DOB: January 08, 1961 DOA: 02/02/2023  PCP: Raliegh Ip, DO   Patient coming from: Home  I have personally briefly reviewed patient's old medical records in [facility]  Chief Complaint: Chest Pain  HPI: Saja Bartolini is a 62 y.o. female with medical history significant for HTN, CVA, asthma, anxiety, goiter, peripheral vascular disease. Patient presented to the ED with complaints of chest pain yesterday 2 days ago, that woke her up from sleep.  Chest pain radiates up to her jaw.  Reports chest pain is intermittent, has been coming in waves since onset.  No known relieving or aggravating factors.  She reports difficulty catching her breath with the chest pain, she reports associated difficulty breathing on exertion -which she attributed to not being fit.  No prior chest pains.  No chest pains with exertion. Undertook an 11-hour car ride to [place] and back July 7 to 11.  No lower extremity swelling. Smokes half a pack of cigarettes daily.  Family history of heart attacks in grandparents.  Reports history of a TIA.  ED Course: Stable vitals.  Troponin 65 >> 116.  EKG without changes.  Dimer 0.63.  Subsequent CTA chest negative for PE. Cardiology was consulted, recommended admission here, likely send to [facility] tomorrow for cath.  Review of Systems: As per HPI all other systems reviewed and negative.  Past Medical History:  Diagnosis Date   Allergy    Anemia    history of   Anxiety    Arthritis    Asthma    Atherosclerosis    Depression    Family history of adverse reaction to anesthesia    pt's mother felt everything and couldn't speak during a gallbladder surgery   GERD (gastroesophageal reflux disease)    History of hiatal hernia    History of kidney stones    Hypertension    Pneumonia    a few times   TIA (transient ischemic attack) 2012    Past Surgical History:  Procedure Laterality Date   ABDOMINAL  HYSTERECTOMY     abdominal   APPENDECTOMY     bladder tack     CARPAL TUNNEL RELEASE Bilateral    CERVICAL SPINE SURGERY     5-6, 6-7   CHOLECYSTECTOMY     COLONOSCOPY WITH PROPOFOL N/A 07/10/2020   Procedure: COLONOSCOPY WITH PROPOFOL;  Surgeon: Lanelle Bal, DO;  Location: [facility] SUITE;  Service: Endoscopy;  Laterality: N/A;  12:15pm   ENDOVENOUS ABLATION SAPHENOUS VEIN W/ LASER Left 11/28/2021   endovenous laser ablation left greater saphenous vein by Coral Else MD   HAMMER TOE SURGERY Right    JOINT REPLACEMENT     TOTAL HIP 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   Procedure: LEFT TOTAL HIP ARTHROPLASTY -DIRECT ANTERIOR;  Surgeon: Eldred Manges, MD;  Location: [facility] OR;  Service: Orthopedics;  Laterality: Left;   TOTAL HIP ARTHROPLASTY Right 12/05/2019   Procedure: RIGHT TOTAL HIP ARTHROPLASTY ANTERIOR APPROACH  DIRECT ANTERIOR;  Surgeon: Eldred Manges, MD;  Location: [facility] OR;  Service: Orthopedics;  Laterality: Right;     reports that she has been smoking cigarettes. She has a 11.5 pack-year smoking history. She has never used smokeless tobacco. She reports current alcohol use. She reports that she does not use drugs.  Allergies  Allergen Reactions   Bee Pollen Anaphylaxis and Swelling   Bee Venom Anaphylaxis and Swelling   Butorphanol Other (See Comments)    Not sure told  by md she was allergic after surgery.    Meloxicam Anxiety    Mood disorder Altered her personality    Penicillins Anaphylaxis and Hives    Unable to Recall As child mom was told she is highly allergic Did it involve swelling of the face/tongue/throat, SOB, or low BP? Unknown Did it involve sudden or severe rash/hives, skin peeling, or any reaction on the inside of your mouth or nose? Unknown Did you need to seek medical attention at a hospital or doctor's office? Unknown When did it last happen?      childhood allergy If all above answers are "NO", may proceed with cephalosporin use.    Prochlorperazine  Edisylate Anaphylaxis   Sulfa Antibiotics Anaphylaxis    Unable to Recall   Tramadol Anxiety    Didn't like the way it made her feel    Clindamycin Hives   Levaquin [Levofloxacin] Itching   Topiramate Nausea And Vomiting   Doxycycline Dermatitis, Hives, Itching, Photosensitivity and Rash    Family History  Problem Relation Age of Onset   Anxiety disorder Mother    Depression Mother    Heart disease Mother    Hypertension Mother    Arrhythmia Mother    Cancer Father        colon, age greater than 69   Lung cancer Father    Migraines Sister    Cancer Daughter        nerve sheath sarcoma   Alcohol abuse Brother    Breast cancer Neg Hx     Prior to Admission medications   Medication Sig Start Date End Date Taking? Authorizing Provider  albuterol (VENTOLIN HFA) 108 (90 Base) MCG/ACT inhaler Inhale 2 puffs into the lungs every 6 (six) hours as needed for wheezing or shortness of breath.   Yes [provider]  amLODipine (NORVASC) 5 MG tablet Take 1 tablet (5 mg total) by mouth daily. 11/03/22  Yes Gottschalk, Kathie Rhodes M, DO  cetirizine (ZYRTEC) 10 MG tablet Take 10 mg by mouth daily.   Yes [provider]  cyclobenzaprine (FLEXERIL) 10 MG tablet TAKE 1/2 TO 1 TABLET BY MOUTH 3 TIMES A DAY AS NEEDED FOR MUSCLE SPASMS 02/04/22  Yes Gottschalk, Ashly M, DO  EPINEPHrine (EPIPEN 2-PAK) 0.3 mg/0.3 mL IJ SOAJ injection Inject 0.3 mg into the muscle as needed for anaphylaxis. 03/05/22  Yes Delynn Flavin M, DO  HYDROcodone-acetaminophen (NORCO/VICODIN) 5-325 MG tablet Take 1 tablet by mouth every 4 (four) hours as needed for severe pain (PUT ON FILE). 11/03/22  Yes Delynn Flavin M, DO  metoprolol succinate (TOPROL-XL) 25 MG 24 hr tablet Take 1 tablet (25 mg total) by mouth daily. 11/03/22  Yes Gottschalk, Kathie Rhodes M, DO  rosuvastatin (CRESTOR) 10 MG tablet Take 1 tablet (10 mg total) by mouth every other day. 11/03/22  Yes Delynn Flavin M, DO  sertraline (ZOLOFT) 100 MG  tablet Take 1.5 tablets (150 mg total) by mouth daily. 11/03/22  Yes Delynn Flavin M, DO  solifenacin (VESICARE) 5 MG tablet Take 1 tablet (5 mg total) by mouth daily. 01/14/23  Yes McKenzie, Mardene Celeste, MD  traZODone (DESYREL) 100 MG tablet TAKE 1 TO 2 TABLETS AT     BEDTIME AS NEEDED FOR SLEEP 04/04/22  Yes Raliegh Ip, DO    Physical Exam: Vitals:   02/02/23 1345 02/02/23 1542 02/02/23 1544 02/02/23 1615  BP: 123/66 134/73  (!) 143/74  Pulse: (!) 54 (!) 56  (!) 54  Resp: 18  Temp:   97.7 F (36.5 C)   SpO2: 95% 96%  97%  Weight:    105.2 kg  Height:    5\' 5"  (1.651 m)    Constitutional: NAD, calm, comfortable Vitals:   02/02/23 1345 02/02/23 1542 02/02/23 1544 02/02/23 1615  BP: 123/66 134/73  (!) 143/74  Pulse: (!) 54 (!) 56  (!) 54  Resp: 18     Temp:   97.7 F (36.5 C)   SpO2: 95% 96%  97%  Weight:    105.2 kg  Height:    5\' 5"  (1.651 m)   Eyes: PERRL, lids and conjunctivae normal ENMT: Mucous membranes are moist.  Neck: normal, supple, no masses, no thyromegaly Respiratory: clear to auscultation bilaterally, no wheezing, no crackles. Normal respiratory effort. No accessory muscle use.  Cardiovascular: Regular rate and rhythm, no murmurs / rubs / gallops. No extremity edema.  Extremities warm Abdomen: no tenderness, no masses palpated. No hepatosplenomegaly. Bowel sounds positive.  Musculoskeletal: no clubbing / cyanosis. No joint deformity upper and lower extremities.  Skin: no rashes, lesions, ulcers. No induration Neurologic: Speech fluent, moving extremities spontaneously, no facial asymmetry.Marland Kitchen  Psychiatric: Normal judgment and insight. Alert and oriented x 3. Normal mood.   Labs on Admission: I have personally reviewed following labs and imaging studies  CBC: Recent Labs  Lab 02/02/23 1153  WBC 5.9  NEUTROABS 4.5  HGB 14.2  HCT 43.8  MCV 92.0  PLT 207   Basic Metabolic Panel: Recent Labs  Lab 02/02/23 1153  NA 136  K 3.8  CL 104  CO2  24  GLUCOSE 95  BUN 10  CREATININE 0.58  CALCIUM 8.7*   Radiological Exams on Admission: CT Angio Chest PE W and/or Wo Contrast  Result Date: 02/02/2023 CLINICAL DATA:  Left-sided chest pain and shortness of breath. EXAM: CT ANGIOGRAPHY CHEST WITH CONTRAST TECHNIQUE: Multidetector CT imaging of the chest was performed using the standard protocol during bolus administration of intravenous contrast. Multiplanar CT image reconstructions and MIPs were obtained to evaluate the vascular anatomy. RADIATION DOSE REDUCTION: This exam was performed according to the departmental dose-optimization program which includes automated exposure control, adjustment of the mA and/or kV according to patient size and/or use of iterative reconstruction technique. CONTRAST:  75mL OMNIPAQUE IOHEXOL 300 MG/ML  SOLN COMPARISON:  None Available. FINDINGS: Cardiovascular: Satisfactory opacification of the pulmonary arteries to the segmental level. No evidence of pulmonary embolism. Normal heart size. No pericardial effusion. Calcific atherosclerotic disease of the coronary arteries. Mediastinum/Nodes: No enlarged mediastinal, hilar, or axillary lymph nodes. Thyroid gland, trachea, and esophagus demonstrate no significant findings. Lungs/Pleura: Lungs are clear. No pleural effusion or pneumothorax. Upper Abdomen: No acute abnormality. Musculoskeletal: Kyphosis and spondylosis of the thoracic spine. Review of the MIP images confirms the above findings. IMPRESSION: 1. No evidence of pulmonary embolism. 2. Calcific atherosclerotic disease of the coronary arteries. 3. Aortic atherosclerosis. Aortic Atherosclerosis (ICD10-I70.0). Electronically Signed   By: Ted Mcalpine M.D.   On: 02/02/2023 14:43   DG Chest Portable 1 View  Result Date: 02/02/2023 CLINICAL DATA:  Chest pain EXAM: PORTABLE CHEST 1 VIEW COMPARISON:  Chest radiograph 12/02/2019 FINDINGS: The heart is at the upper limits of normal for size. The upper mediastinal  contours are normal. There is no focal consolidation or pulmonary edema. There is no pleural effusion or pneumothorax There is no acute osseous abnormality. Cervical spine fusion hardware is noted. IMPRESSION: No radiographic evidence of acute cardiopulmonary process. Electronically Signed   By: Theron Arista  Noone M.D.   On: 02/02/2023 12:47    EKG: Independently reviewed.  Sinus rhythm, rate 50, QTc 402.  No significant change from prior.  Assessment/Plan Principal Problem:   NSTEMI (non-ST elevated myocardial infarction) (HCC) Active Problems:   Primary hypertension   Tobacco abuse  Assessment and Plan: * NSTEMI (non-ST elevated myocardial infarction) (HCC) Presenting with chest pains.  Troponin 65 >> 116 >> 290.  EKG with sinus rhythm, unchanged from prior.  Smokes cigarettes.  History of CVA/TIA.  Recent 11 road trip, D-dimer 0.63, CTA chest negative for PE. -Evaluated by cardiology-admitted here by hospitalist team, would benefit from transfer to [facility] for left heart cath, ACS protocol to include aspirin load followed by 81 mg once daily, heparin drip, high intensity statin, start Imdur 30 mg once daily. -EKG in a.m. -N.p.o. midnight - N/s 100cc/hr x 10hrs   Tobacco abuse Smokes half a pack of cigarettes daily.   - Counseled to quit smoking.  Patient voiced understanding -Nicotine patch  Primary hypertension Stable. -Resume metoprolol -Add Imdur as recommended by cardiology -Hold home Norvasc   DVT prophylaxis: Heparin Code Status: FULL Family Communication: None at bedside Disposition Plan:  ~ 2 days Consults called: [facility]Ardiology Admission status:  Inpt Tele    Author: Onnie Boer, MD 02/02/2023 8:13 PM  For on call review [email or web address].

## 2023-02-02 NOTE — Consult Note (Signed)
CARDIOLOGY CONSULT NOTE    [name] ID: Sydney [name]; [id number]; [birth year]   Admit date: 02/02/2023 Date of Consult: 02/02/2023  Primary Care Provider: Raliegh Ip, DO Primary Cardiologist:  Primary Electrophysiologist:     [name] Profile:   Sydney [name] is a 62 y.o. female with a hx HTN, nicotine abuse of  who is being seen today for Sydney evaluation of chest pain at Sydney request of Dr. Charm Barges.  History of Present Illness:   Sydney [name] is a 63 year old F known to have HTN, nicotine abuse presented to [facility] ER with chest pain.  [name] was doing a bunch of stuff yesterday when Sydney [name] started to have sudden onset of substernal chest pain radiating to Sydney [name] neck, jaw, bilateral shoulders and back that lasted for a while and resolved spontaneously.  Sydney [name] had a recurrent chest pain of similar quality that woke Sydney [name] up from sleep last night and lasted for couple of hours until Sydney [name] came to Sydney ER.  EKG showed NSR, no ischemia. hS troponins 65>>116.  No other symptoms of dizziness, presyncope, syncope, palpitations and DOE.  Smokes half a pack a day (Sydney [name] states Sydney [name] does not inhale Sydney smoke but only tastes it).  Past Medical History:  Diagnosis Date   Allergy    Anemia    history of   Anxiety    Arthritis    Asthma    Atherosclerosis    Depression    Family history of adverse reaction to anesthesia    pt's mother felt everything and couldn't speak during a gallbladder surgery   GERD (gastroesophageal reflux disease)    History of hiatal hernia    History of kidney stones    Hypertension    Pneumonia    a few times   TIA (transient ischemic attack) 2012    Past Surgical History:  Procedure Laterality Date   ABDOMINAL HYSTERECTOMY     abdominal   APPENDECTOMY     bladder tack     CARPAL TUNNEL RELEASE Bilateral    CERVICAL SPINE SURGERY     5-6, 6-7   CHOLECYSTECTOMY     COLONOSCOPY WITH PROPOFOL N/A 07/10/2020   Procedure: COLONOSCOPY WITH PROPOFOL;  Surgeon:  Lanelle Bal, DO;  Location: [facility];  Service: Endoscopy;  Laterality: N/A;  12:15pm   ENDOVENOUS ABLATION SAPHENOUS VEIN W/ LASER Left 11/28/2021   endovenous laser ablation left greater saphenous vein by Coral Else MD   HAMMER TOE SURGERY Right    JOINT REPLACEMENT     TOTAL HIP 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   Procedure: LEFT TOTAL HIP ARTHROPLASTY -DIRECT ANTERIOR;  Surgeon: Eldred Manges, MD;  Location: [facility];  Service: Orthopedics;  Laterality: Left;   TOTAL HIP ARTHROPLASTY Right 12/05/2019   Procedure: RIGHT TOTAL HIP ARTHROPLASTY ANTERIOR APPROACH  DIRECT ANTERIOR;  Surgeon: Eldred Manges, MD;  Location: [facility] OR;  Service: Orthopedics;  Laterality: Right;       Inpatient Medications: Scheduled Meds:  morphine (PF)  4 mg Intravenous Once   ondansetron  4 mg Intravenous Once   Continuous Infusions:  PRN Meds:   Allergies:    Allergies  Allergen Reactions   Bee Pollen Anaphylaxis and Swelling   Bee Venom Anaphylaxis and Swelling   Butorphanol Other (See Comments)    Not sure told by md Sydney [name] was allergic after surgery.    Meloxicam Anxiety    Mood disorder Altered Sydney [name] personality    Penicillins Anaphylaxis and Hives    Unable  to Recall As child mom was told Sydney [name] is highly allergic Did it involve swelling of Sydney face/tongue/throat, SOB, or low BP? Unknown Did it involve sudden or severe rash/hives, skin peeling, or any reaction on Sydney inside of your mouth or nose? Unknown Did you need to seek medical attention at a hospital or doctor's office? Unknown When did it last happen?      childhood allergy If all above answers are "NO", may proceed with cephalosporin use.    Prochlorperazine Edisylate Anaphylaxis   Sulfa Antibiotics Anaphylaxis    Unable to Recall   Tramadol Anxiety    Didn't like Sydney way it made Sydney [name] feel    Clindamycin Hives   Levaquin [Levofloxacin] Itching   Topiramate Nausea And Vomiting   Doxycycline Dermatitis, Hives, Itching,  Photosensitivity and Rash    Social History:   Social History   Socioeconomic History   Marital status: Married    Spouse name: Not on file   Number of children: 3   Years of education: Not on file   Highest education level: 12th grade  Occupational History   Not on file  Tobacco Use   Smoking status: Every Day    Current packs/day: 0.50    Average packs/day: 0.5 packs/day for 23.0 years (11.5 ttl pk-yrs)    Types: Cigarettes   Smokeless tobacco: Never  Vaping Use   Vaping status: Never Used  Substance and Sexual Activity   Alcohol use: Yes    Comment: occ   Drug use: Never   Sexual activity: Not Currently  Other Topics Concern   Not on file  Social History Narrative   Recently relocated to [place] from [place].  Sydney [name] resides at home with Sydney [name] husband.   Sydney [name] has 3 children but 1 passed away from cancer.   Social Determinants of Health   Financial Resource Strain: Low Risk  (10/30/2022)   Overall Financial Resource Strain (CARDIA)    Difficulty of Paying Living Expenses: Not hard at all  Food Insecurity: No Food Insecurity (10/30/2022)   Hunger Vital Sign    Worried About Running Out of Food in Sydney Last Year: Never true    Ran Out of Food in Sydney Last Year: Never true  Transportation Needs: No Transportation Needs (10/30/2022)   PRAPARE - Administrator, Civil Service (Medical): No    Lack of Transportation (Non-Medical): No  Physical Activity: Unknown (10/30/2022)   Exercise Vital Sign    Days of Exercise per Week: 2 days    Minutes of Exercise per Session: [name] declined  Stress: Stress Concern Present (10/30/2022)   [employer] of Occupational Health - Occupational Stress Questionnaire    Feeling of Stress : To some extent  Social Connections: Unknown (10/30/2022)   Social Connection and Isolation Panel [NHANES]    Frequency of Communication with Friends and Family: Twice a week    Frequency of Social Gatherings with Friends and Family: Once a  week    Attends Religious Services: [name] declined    Database administrator or Organizations: No    Attends Engineer, structural: Not on file    Marital Status: Married  Catering manager Violence: Not on file    Family History:    Family History  Problem Relation Age of Onset   Anxiety disorder Mother    Depression Mother    Heart disease Mother    Hypertension Mother    Arrhythmia Mother    Cancer Father  colon, age greater than 9   Lung cancer Father    Migraines Sister    Cancer Daughter        nerve sheath sarcoma   Alcohol abuse Brother    Breast cancer Neg Hx      ROS:  Please see Sydney history of present illness.  ROS All other ROS reviewed and negative.     Physical Exam/Data:   Vitals:   02/02/23 1345 02/02/23 1542 02/02/23 1544 02/02/23 1615  BP: 123/66 134/73  (!) 143/74  Pulse: (!) 54 (!) 56  (!) 54  Resp: 18     Temp:   97.7 F (36.5 C)   SpO2: 95% 96%  97%   No intake or output data in Sydney 24 hours ending 02/02/23 1634 There were no vitals filed for this visit. There is no height or weight on file to calculate BMI.  General:  Well nourished, well developed, in no acute distress HEENT: normal Lymph: no adenopathy Neck: no JVD Endocrine:  No thryomegaly Vascular: No carotid bruits; FA pulses 2+ bilaterally without bruits  Cardiac:  normal S1, S2; RRR; no murmur  Lungs:  clear to auscultation bilaterally, no wheezing, rhonchi or rales  Abd: soft, nontender, no hepatomegaly  Ext: no edema Musculoskeletal:  No deformities, BUE and BLE strength normal and equal Skin: warm and dry  Neuro:  CNs 2-12 intact, no focal abnormalities noted Psych:  Normal affect   EKG:  Sydney EKG was personally reviewed and demonstrates:   Telemetry:  Telemetry was personally reviewed and demonstrates:    Relevant CV Studies:   Laboratory Data:  Chemistry Recent Labs  Lab 02/02/23 1153  NA 136  K 3.8  CL 104  CO2 24  GLUCOSE 95  BUN 10   CREATININE 0.58  CALCIUM 8.7*  GFRNONAA >60  ANIONGAP 8    No results for input(s): "PROT", "ALBUMIN", "AST", "ALT", "ALKPHOS", "BILITOT" in Sydney last 168 hours. Hematology Recent Labs  Lab 02/02/23 1153  WBC 5.9  RBC 4.76  HGB 14.2  HCT 43.8  MCV 92.0  MCH 29.8  MCHC 32.4  RDW 13.7  PLT 207   Cardiac EnzymesNo results for input(s): "TROPONINI" in Sydney last 168 hours. No results for input(s): "TROPIPOC" in Sydney last 168 hours.  BNPNo results for input(s): "BNP", "PROBNP" in Sydney last 168 hours.  DDimer  Recent Labs  Lab 02/02/23 1153  DDIMER 0.63*    Radiology/Studies:  CT Angio Chest PE W and/or Wo Contrast  Result Date: 02/02/2023 CLINICAL DATA:  Left-sided chest pain and shortness of breath. EXAM: CT ANGIOGRAPHY CHEST WITH CONTRAST TECHNIQUE: Multidetector CT imaging of Sydney chest was performed using Sydney standard protocol during bolus administration of intravenous contrast. Multiplanar CT image reconstructions and MIPs were obtained to evaluate Sydney vascular anatomy. RADIATION DOSE REDUCTION: This exam was performed according to Sydney departmental dose-optimization program which includes automated exposure control, adjustment of the mA and/or kV according to [name] size and/or use of iterative reconstruction technique. CONTRAST:  75mL OMNIPAQUE IOHEXOL 300 MG/ML  SOLN COMPARISON:  None Available. FINDINGS: Cardiovascular: Satisfactory opacification of Sydney pulmonary arteries to Sydney segmental level. No evidence of pulmonary embolism. Normal heart size. No pericardial effusion. Calcific atherosclerotic disease of Sydney coronary arteries. Mediastinum/Nodes: No enlarged mediastinal, hilar, or axillary lymph nodes. Thyroid gland, trachea, and esophagus demonstrate no significant findings. Lungs/Pleura: Lungs are clear. No pleural effusion or pneumothorax. Upper Abdomen: No acute abnormality. Musculoskeletal: Kyphosis and spondylosis of Sydney thoracic spine. Review of Sydney  MIP images confirms Sydney  above findings. IMPRESSION: 1. No evidence of pulmonary embolism. 2. Calcific atherosclerotic disease of Sydney coronary arteries. 3. Aortic atherosclerosis. Aortic Atherosclerosis (ICD10-I70.0). Electronically Signed   By: Ted Mcalpine M.D.   On: 02/02/2023 14:43   DG Chest Portable 1 View  Result Date: 02/02/2023 CLINICAL DATA:  Chest pain EXAM: PORTABLE CHEST 1 VIEW COMPARISON:  Chest radiograph 12/02/2019 FINDINGS: Sydney heart is at Sydney upper limits of normal for size. Sydney upper mediastinal contours are normal. There is no focal consolidation or pulmonary edema. There is no pleural effusion or pneumothorax There is no acute osseous abnormality. Cervical spine fusion hardware is noted. IMPRESSION: No radiographic evidence of acute cardiopulmonary process. Electronically Signed   By: Lesia Hausen M.D.   On: 02/02/2023 12:47    Assessment and Plan:   # NSTEMI -Presented with exertional chest pain radiating to Sydney [name] neck, jaw, bilateral shoulders and back, lasting for a while and resolve spontaneously yesterday. Sydney [name] had recurrent chest pain of similar quality that woke Sydney [name] up from sleep last night lasted for a couple of hours until Sydney [name] presented to Sydney ER.  EKG showed NSR, no ischemia.  High-sensitivity troponins 65>>116.  Cardiac risk factors include HTN and longstanding smoking history (half a pack a day). [name] will benefit from transfer to [facility] for [facility]. Can be admitted to hospitalist team here. Start ACS protocol, aspirin load followed by 81 mg once daily, heparin drip, high intensity statin. Start Imdur 30 mg once daily. -Risks and benefits of cardiac catheterization have been discussed with Sydney [name]. These include bleeding, infection, kidney damage, stroke, heart attack, death.  Sydney [name] understands these risks and is willing to proceed. -[name] underwent a CT angio chest this afternoon, it is reasonable to get [facility] tomorrow. Sydney [name] is stable now with no chest pain except for some mild  discomfort in Sydney [name] back. Trend troponins x 3 and obtain 2D echocardiogram. -Obtain lipoprotein a, hemoglobin A1c, lipids in Sydney a.m.  # HTN, partially controlled -Hold home metoprolol and start Imdur 30 mg once daily, continue home dose of amlodipine 5 mg once daily.  # Nicotine abuse -Currently smokes half a pack a day.  Smoking cessation counseling provided.  Sydney [name] is motivated to cut down.    For questions or updates, please contact [facility] Please consult [email or web address] for contact info under Cardiology/STEMI.   Signed, Herbert Deaner, MD 02/02/2023 4:34 PM

## 2023-02-02 NOTE — Assessment & Plan Note (Signed)
Stable. -Resume metoprolol -Add Imdur as recommended by cardiology -Hold home Norvasc

## 2023-02-03 ENCOUNTER — Encounter (HOSPITAL_COMMUNITY)
Admission: EM | Disposition: A | Discharge status: Home / Self Care | Payer: Self-pay | Source: Home / Self Care | Attending: Internal Medicine

## 2023-02-03 ENCOUNTER — Other Ambulatory Visit: Payer: Self-pay

## 2023-02-03 DIAGNOSIS — Z8249 Family history of ischemic heart disease and other diseases of the circulatory system: Secondary | ICD-10-CM | POA: Diagnosis not present

## 2023-02-03 DIAGNOSIS — G4700 Insomnia, unspecified: Secondary | ICD-10-CM | POA: Diagnosis present

## 2023-02-03 DIAGNOSIS — F17210 Nicotine dependence, cigarettes, uncomplicated: Secondary | ICD-10-CM | POA: Diagnosis present

## 2023-02-03 DIAGNOSIS — E785 Hyperlipidemia, unspecified: Secondary | ICD-10-CM | POA: Diagnosis present

## 2023-02-03 DIAGNOSIS — Z881 Allergy status to other antibiotic agents status: Secondary | ICD-10-CM | POA: Diagnosis not present

## 2023-02-03 DIAGNOSIS — I10 Essential (primary) hypertension: Secondary | ICD-10-CM | POA: Diagnosis present

## 2023-02-03 DIAGNOSIS — E669 Obesity, unspecified: Secondary | ICD-10-CM | POA: Diagnosis present

## 2023-02-03 DIAGNOSIS — Z91030 Bee allergy status: Secondary | ICD-10-CM | POA: Diagnosis not present

## 2023-02-03 DIAGNOSIS — Z90710 Acquired absence of both cervix and uterus: Secondary | ICD-10-CM | POA: Diagnosis not present

## 2023-02-03 DIAGNOSIS — Z79899 Other long term (current) drug therapy: Secondary | ICD-10-CM | POA: Diagnosis not present

## 2023-02-03 DIAGNOSIS — I739 Peripheral vascular disease, unspecified: Secondary | ICD-10-CM | POA: Diagnosis present

## 2023-02-03 DIAGNOSIS — F419 Anxiety disorder, unspecified: Secondary | ICD-10-CM | POA: Diagnosis present

## 2023-02-03 DIAGNOSIS — Z880 Allergy status to penicillin: Secondary | ICD-10-CM | POA: Diagnosis not present

## 2023-02-03 DIAGNOSIS — F32A Depression, unspecified: Secondary | ICD-10-CM | POA: Diagnosis present

## 2023-02-03 DIAGNOSIS — Z888 Allergy status to other drugs, medicaments and biological substances status: Secondary | ICD-10-CM | POA: Diagnosis not present

## 2023-02-03 DIAGNOSIS — I2510 Atherosclerotic heart disease of native coronary artery without angina pectoris: Secondary | ICD-10-CM | POA: Diagnosis not present

## 2023-02-03 DIAGNOSIS — I252 Old myocardial infarction: Secondary | ICD-10-CM | POA: Diagnosis not present

## 2023-02-03 DIAGNOSIS — Z8673 Personal history of transient ischemic attack (TIA), and cerebral infarction without residual deficits: Secondary | ICD-10-CM | POA: Diagnosis not present

## 2023-02-03 DIAGNOSIS — Z87442 Personal history of urinary calculi: Secondary | ICD-10-CM | POA: Diagnosis not present

## 2023-02-03 DIAGNOSIS — K219 Gastro-esophageal reflux disease without esophagitis: Secondary | ICD-10-CM | POA: Diagnosis present

## 2023-02-03 DIAGNOSIS — G8929 Other chronic pain: Secondary | ICD-10-CM | POA: Diagnosis present

## 2023-02-03 DIAGNOSIS — Z885 Allergy status to narcotic agent status: Secondary | ICD-10-CM | POA: Diagnosis not present

## 2023-02-03 DIAGNOSIS — Z720 Tobacco use: Secondary | ICD-10-CM | POA: Diagnosis not present

## 2023-02-03 DIAGNOSIS — I214 Non-ST elevation (NSTEMI) myocardial infarction: Secondary | ICD-10-CM | POA: Diagnosis present

## 2023-02-03 DIAGNOSIS — J45909 Unspecified asthma, uncomplicated: Secondary | ICD-10-CM | POA: Diagnosis present

## 2023-02-03 DIAGNOSIS — Z9049 Acquired absence of other specified parts of digestive tract: Secondary | ICD-10-CM | POA: Diagnosis not present

## 2023-02-03 HISTORY — PX: LEFT HEART CATH AND CORONARY ANGIOGRAPHY: CATH118249

## 2023-02-03 HISTORY — PX: CORONARY STENT INTERVENTION: CATH118234

## 2023-02-03 LAB — CBC
HCT: 38.6 % (ref 36.0–46.0)
Hemoglobin: 12.6 g/dL (ref 12.0–15.0)
MCH: 30.7 pg (ref 26.0–34.0)
MCHC: 32.6 g/dL (ref 30.0–36.0)
MCV: 93.9 fL (ref 80.0–100.0)
Platelets: 179 10*3/uL (ref 150–400)
RBC: 4.11 MIL/uL (ref 3.87–5.11)
RDW: 13.7 % (ref 11.5–15.5)
WBC: 5.2 10*3/uL (ref 4.0–10.5)
nRBC: 0 % (ref 0.0–0.2)

## 2023-02-03 LAB — BASIC METABOLIC PANEL
Anion gap: 7 (ref 5–15)
BUN: 12 mg/dL (ref 8–23)
CO2: 27 mmol/L (ref 22–32)
Calcium: 8.6 mg/dL — ABNORMAL LOW (ref 8.9–10.3)
Chloride: 108 mmol/L (ref 98–111)
Creatinine, Ser: 0.62 mg/dL (ref 0.44–1.00)
GFR, Estimated: >60 mL/min (ref >60)
Glucose, Bld: 104 mg/dL — ABNORMAL HIGH (ref 70–99)
Potassium: 4.2 mmol/L (ref 3.5–5.1)
Sodium: 142 mmol/L (ref 135–145)

## 2023-02-03 LAB — HEPARIN LEVEL (UNFRACTIONATED)
Heparin Unfractionated: 0.37 IU/mL (ref 0.30–0.70)
Heparin Unfractionated: >1.1 IU/mL — ABNORMAL HIGH (ref 0.30–0.70)

## 2023-02-03 LAB — LIPID PANEL
Cholesterol: 114 mg/dL (ref 0–200)
HDL: 34 mg/dL — ABNORMAL LOW (ref >40)
LDL Cholesterol: 64 mg/dL (ref 0–99)
Total CHOL/HDL Ratio: 3.4 RATIO
Triglycerides: 80 mg/dL (ref <150)
VLDL: 16 mg/dL (ref 0–40)

## 2023-02-03 LAB — HIV ANTIBODY (ROUTINE TESTING W REFLEX): HIV Screen 4th Generation wRfx: NONREACTIVE

## 2023-02-03 LAB — POCT ACTIVATED CLOTTING TIME: Activated Clotting Time: 354 seconds

## 2023-02-03 LAB — TROPONIN I (HIGH SENSITIVITY): Troponin I (High Sensitivity): 202 ng/L (ref <18)

## 2023-02-03 SURGERY — LEFT HEART CATH AND CORONARY ANGIOGRAPHY
Anesthesia: LOCAL

## 2023-02-03 MED ORDER — SODIUM CHLORIDE 0.9 % IV SOLN: INTRAVENOUS | Status: AC

## 2023-02-03 MED ORDER — FENTANYL CITRATE (PF) 100 MCG/2ML IJ SOLN
INTRAMUSCULAR | Status: AC
  Filled 2023-02-03: qty 2

## 2023-02-03 MED ORDER — HEPARIN SODIUM (PORCINE) 1000 UNIT/ML IJ SOLN
INTRAMUSCULAR | Status: AC
  Filled 2023-02-03: qty 10

## 2023-02-03 MED ORDER — VERAPAMIL HCL 2.5 MG/ML IV SOLN
INTRAVENOUS | Status: AC
  Filled 2023-02-03: qty 2

## 2023-02-03 MED ORDER — FENTANYL CITRATE (PF) 100 MCG/2ML IJ SOLN
INTRAMUSCULAR | Status: DC | PRN
  Administered 2023-02-03: 25 ug via INTRAVENOUS

## 2023-02-03 MED ORDER — HEPARIN (PORCINE) IN NACL 1000-0.9 UT/500ML-% IV SOLN
INTRAVENOUS | Status: DC | PRN
  Administered 2023-02-03 (×2): 500 mL

## 2023-02-03 MED ORDER — NITROGLYCERIN 2 % TD OINT: TOPICAL_OINTMENT | Freq: Two times a day (BID) | TRANSDERMAL | Status: DC

## 2023-02-03 MED ORDER — NITROGLYCERIN IN D5W 200-5 MCG/ML-% IV SOLN
3.0000 ug/min | INTRAVENOUS | Status: DC
  Administered 2023-02-03: 3 ug/min via INTRAVENOUS

## 2023-02-03 MED ORDER — NITROGLYCERIN 1 MG/10 ML FOR IR/CATH LAB
INTRA_ARTERIAL | Status: DC | PRN
  Administered 2023-02-03: 200 ug via INTRACORONARY

## 2023-02-03 MED ORDER — MIDAZOLAM HCL 2 MG/2ML IJ SOLN
INTRAMUSCULAR | Status: AC
  Filled 2023-02-03: qty 2

## 2023-02-03 MED ORDER — TICAGRELOR 90 MG PO TABS
ORAL_TABLET | ORAL | Status: AC
  Filled 2023-02-03: qty 2

## 2023-02-03 MED ORDER — HEPARIN SODIUM (PORCINE) 1000 UNIT/ML IJ SOLN
INTRAMUSCULAR | Status: DC | PRN
  Administered 2023-02-03 (×2): 5500 [IU] via INTRAVENOUS

## 2023-02-03 MED ORDER — VERAPAMIL HCL 2.5 MG/ML IV SOLN: INTRAVENOUS | Status: DC | PRN

## 2023-02-03 MED ORDER — MIDAZOLAM HCL 2 MG/2ML IJ SOLN
INTRAMUSCULAR | Status: DC | PRN
  Administered 2023-02-03: 1 mg via INTRAVENOUS

## 2023-02-03 MED ORDER — ENOXAPARIN SODIUM 60 MG/0.6ML IJ SOSY: 50.0000 mg | PREFILLED_SYRINGE | Freq: Every day | INTRAMUSCULAR | Status: DC

## 2023-02-03 MED ORDER — LIDOCAINE HCL (PF) 1 % IJ SOLN
INTRAMUSCULAR | Status: AC
  Filled 2023-02-03: qty 30

## 2023-02-03 MED ORDER — NITROGLYCERIN 1 MG/10 ML FOR IR/CATH LAB
INTRA_ARTERIAL | Status: AC
  Filled 2023-02-03: qty 10

## 2023-02-03 MED ORDER — SODIUM CHLORIDE 0.9 % IV SOLN: INTRAVENOUS | Status: DC

## 2023-02-03 MED ORDER — LIDOCAINE HCL (PF) 1 % IJ SOLN
INTRAMUSCULAR | Status: DC | PRN
  Administered 2023-02-03: 5 mL via INTRADERMAL

## 2023-02-03 MED ORDER — ORAL CARE MOUTH RINSE: 15.0000 mL | OROMUCOSAL | Status: DC | PRN

## 2023-02-03 MED ORDER — NITROGLYCERIN IN D5W 200-5 MCG/ML-% IV SOLN
3.0000 ug/min | INTRAVENOUS | Status: DC
  Filled 2023-02-03: qty 250

## 2023-02-03 MED ORDER — IOHEXOL 350 MG/ML SOLN
INTRAVENOUS | Status: DC | PRN
  Administered 2023-02-03: 132 mL

## 2023-02-03 MED ORDER — LABETALOL HCL 5 MG/ML IV SOLN: 10.0000 mg | INTRAVENOUS | Status: AC | PRN

## 2023-02-03 MED ORDER — HYDRALAZINE HCL 20 MG/ML IJ SOLN: 10.0000 mg | INTRAMUSCULAR | Status: AC | PRN

## 2023-02-03 MED ORDER — ASPIRIN 81 MG PO CHEW: 81.0000 mg | CHEWABLE_TABLET | ORAL | Status: DC

## 2023-02-03 MED ORDER — TICAGRELOR 90 MG PO TABS
ORAL_TABLET | ORAL | Status: DC | PRN
  Administered 2023-02-03: 180 mg via ORAL

## 2023-02-03 MED ORDER — SODIUM CHLORIDE 0.9 % IV SOLN: 250.0000 mL | INTRAVENOUS | Status: DC | PRN

## 2023-02-03 MED ORDER — SODIUM CHLORIDE 0.9% FLUSH: 3.0000 mL | Freq: Two times a day (BID) | INTRAVENOUS | Status: DC

## 2023-02-03 MED ORDER — SODIUM CHLORIDE 0.9% FLUSH: 3.0000 mL | INTRAVENOUS | Status: DC | PRN

## 2023-02-03 MED ORDER — TICAGRELOR 90 MG PO TABS
90.0000 mg | ORAL_TABLET | Freq: Two times a day (BID) | ORAL | Status: DC
  Administered 2023-02-03 – 2023-02-04 (×2): 90 mg via ORAL
  Filled 2023-02-03 (×2): qty 1

## 2023-02-03 MED ORDER — ALPRAZOLAM 0.25 MG PO TABS
0.2500 mg | ORAL_TABLET | Freq: Three times a day (TID) | ORAL | Status: DC | PRN
  Administered 2023-02-03 (×2): 0.25 mg via ORAL
  Filled 2023-02-03 (×2): qty 1

## 2023-02-03 SURGICAL SUPPLY — 19 items
BALLN EMERGE MR 2.0X12 (BALLOONS) ×1
BALLOON EMERGE MR 2.0X12 (BALLOONS) IMPLANT
CATH INFINITI AMBI 5FR TG (CATHETERS) IMPLANT
CATH LAUNCHER 6FR AR1 (CATHETERS) IMPLANT
DEVICE RAD TR BAND REGULAR (VASCULAR PRODUCTS) IMPLANT
GLIDESHEATH SLEND SS 6F .021 (SHEATH) IMPLANT
GUIDEWIRE INQWIRE 1.5J.035X260 (WIRE) IMPLANT
GUIDEWIRE VAS SION BLUE 190 (WIRE) IMPLANT
INQWIRE 1.5J .035X260CM (WIRE) ×1
KIT ENCORE 26 ADVANTAGE (KITS) IMPLANT
KIT HEART LEFT (KITS) ×1 IMPLANT
KIT SINGLE USE MANIFOLD (KITS) IMPLANT
PACK CARDIAC CATHETERIZATION (CUSTOM PROCEDURE TRAY) ×1 IMPLANT
SET ATX-X65L (MISCELLANEOUS) IMPLANT
STENT SYNERGY XD 2.25X16 (Permanent Stent) IMPLANT
SYNERGY XD 2.25X16 (Permanent Stent) ×1 IMPLANT
TRANSDUCER W/STOPCOCK (MISCELLANEOUS) ×1 IMPLANT
TUBING CIL FLEX 10 FLL-RA (TUBING) ×1 IMPLANT
WIRE ASAHI PROWATER 180CM (WIRE) IMPLANT

## 2023-02-03 NOTE — Plan of Care (Signed)
  Problem: Clinical Measurements: Goal: Ability to maintain clinical measurements within normal limits will improve Outcome: Progressing   Problem: Clinical Measurements: Goal: Cardiovascular complication will be avoided Outcome: Progressing   Problem: Coping: Goal: Level of anxiety will decrease Outcome: Progressing   Problem: Pain Managment: Goal: General experience of comfort will improve Outcome: Progressing   Problem: Safety: Goal: Ability to remain free from injury will improve Outcome: Progressing

## 2023-02-03 NOTE — Interval H&P Note (Signed)
History and Physical Interval Note:  02/03/2023 12:38 PM  Sydney [name]  has presented today for surgery, with the diagnosis of chest pain.  The various methods of treatment have been discussed with the patient and family. After consideration of risks, benefits and other options for treatment, the patient has consented to  Procedure(s): LEFT HEART CATH AND CORONARY ANGIOGRAPHY (N/A) PERCUTANEOUS CORONARY INTERVENTION  as a surgical intervention.  The patient's history has been reviewed, patient examined, no change in status, stable for surgery.  I have reviewed the patient's chart and labs.  Questions were answered to the patient's satisfaction.    Cath Lab Visit (complete for each Cath Lab visit)  Clinical Evaluation Leading to the Procedure:   ACS: Yes.    Non-ACS:    Anginal Classification: CCS IV  Anti-ischemic medical therapy: Maximal Therapy (2 or more classes of medications)  Non-Invasive Test Results: No non-invasive testing performed  Prior CABG: No previous CABG    Bryan Lemma

## 2023-02-03 NOTE — H&P (View-Only) (Signed)
   Patient Name: Sydney [name] Date of Encounter: 02/03/2023 [facility] Cardiologist: None    Interval Summary  .    Felt heart fluttering last night. No further chest pain. Anxious about cath.  Vital Signs .    Vitals:   02/02/23 2010 02/02/23 2152 02/03/23 0150 02/03/23 0515  BP:  (!) 126/59 123/65 (!) 100/56  Pulse:  (!) 59 (!) 50   Resp:  20 20 18   Temp:  98.8 F (37.1 C) 98.6 F (37 C) 98 F (36.7 C)  TempSrc:  Oral Oral   SpO2: 96% 96% 97% 95%  Weight:      Height:        Intake/Output Summary (Last 24 hours) at 02/03/2023 0816 Last data filed at 02/03/2023 0500 Gross per 24 hour  Intake 398.9 ml  Output --  Net 398.9 ml      02/02/2023    4:15 PM 01/08/2023   10:56 AM 11/06/2022    1:47 PM  Last 3 Weights  Weight (lbs) 232 lb 232 lb 239 lb  Weight (kg) 105.235 kg 105.235 kg 108.41 kg      Telemetry/ECG    Sinus brady 40's - Personally Reviewed  Physical Exam .   GEN: No acute distress.   Neck: No JVD Cardiac:  RRR, no murmurs, rubs, or gallops.  Respiratory: Clear to auscultation bilaterally. GI: Soft, nontender, non-distended  MS: No edema  Assessment & Plan .     NSTEMI trop [phone] on IV heparin, Imdur, metoprolol, crestor, will hold metoprolol due to bradycardia. HA on imdur. Transfer to [facility] on our service today for LHC. Risks/benefits reviewed yest by Dr. Jenene Slicker.   HTN-BP low normal-hold metoprolol  Tobacco abuse-smoking cessation  Sinus bradycardia 40's-hold metoprolol for now.  HLD-check FLP   For questions or updates, please contact [facility] Please consult [email or web address] for contact info under        Signed, Jacolyn Reedy, PA-C       Attending attestation  No acute events overnight.  She started to complain of jaw pain followed by chest pain radiating to her left arm this morning requiring 3 sublingual nitroglycerin. Pain did not resolve.  Nitroglycerin drip was recommended but since patient  was on the floor, nitro patch was started. She is pending transfer to [facility] today for NSTEMI.  Physical examination is remarkable for patient in mild distress, HEENT normal, no JVD, S1-S2 normal, clear lungs, abdomen NT and ND, no pitting edema in bilateral lower extremities. Continue ACS protocol for NSTEMI and transfer to [facility] for LHC. Hold beta-blocker due to severe sinus bradycardia.  Had headaches with Imdur, hence discontinued. But okay to use nitro patch as she is having chest pains and administer Tylenol for headaches. Nicotine patches for smoking withdrawal.   Sophira Rumler Verne Spurr, MD [place]  [name]  10:43 AM

## 2023-02-03 NOTE — OR Nursing (Signed)
Patient arrived via [facility] on Nitroglycerin gtt at 66mcg/min, Heparin gtt at 1300u/kg/min, and Normal Saline at 210ml/hr complaining of 8/10 intermittent chest pain and light headedness . EKG obtained, Sinus Brady with ST elevation. Titrated Nitroglycerin gtt to 37mcg/min which relieved symptoms. BP maintaining above 100 systolic. Consent obtained. Report given to cath lab RN and patient taken to cath lab.

## 2023-02-03 NOTE — Progress Notes (Signed)
ANTICOAGULATION CONSULT NOTE -  Pharmacy Consult for heparin Indication: chest pain/ACS  Allergies  Allergen Reactions   Bee Pollen Anaphylaxis and Swelling   Bee Venom Anaphylaxis and Swelling   Butorphanol Other (See Comments)    Not sure told by md she was allergic after surgery.    Meloxicam Anxiety    Mood disorder Altered her personality    Penicillins Anaphylaxis and Hives    Unable to Recall As child mom was told she is highly allergic Did it involve swelling of the face/tongue/throat, SOB, or low BP? Unknown Did it involve sudden or severe rash/hives, skin peeling, or any reaction on the inside of your mouth or nose? Unknown Did you need to seek medical attention at a hospital or doctor's office? Unknown When did it last happen?      childhood allergy If all above answers are "NO", may proceed with cephalosporin use.    Prochlorperazine Edisylate Anaphylaxis   Sulfa Antibiotics Anaphylaxis    Unable to Recall   Tramadol Anxiety    Didn't like the way it made her feel    Clindamycin Hives   Levaquin [Levofloxacin] Itching   Topiramate Nausea And Vomiting   Doxycycline Dermatitis, Hives, Itching, Photosensitivity and Rash    Patient Measurements: Height: 5\' 5"  (165.1 cm) Weight: 105.2 kg (232 lb) IBW/kg (Calculated) : 57 Heparin Dosing Weight: 81.4 kg  Vital Signs: Temp: 98 F (36.7 C) (07/16 0515) Temp Source: Oral (07/16 0150) BP: 100/56 (07/16 0515) Pulse Rate: 50 (07/16 0150)  Labs: Recent Labs    02/02/23 1153 02/02/23 1330 02/02/23 1845 02/02/23 2313 02/03/23 0457 02/03/23 0746  HGB 14.2  --   --   --  12.6  --   HCT 43.8  --   --   --  38.6  --   PLT 207  --   --   --  179  --   HEPARINUNFRC  --   --   --  0.26*  --  0.37  CREATININE 0.58  --   --   --  0.62  --   TROPONINIHS 65* 116* 290*  --  202*  --     Estimated Creatinine Clearance: 86.7 mL/min (by C-G formula based on SCr of 0.62 mg/dL).   Medical History: Past Medical  History:  Diagnosis Date   Allergy    Anemia    history of   Anxiety    Arthritis    Asthma    Atherosclerosis    Depression    Family history of adverse reaction to anesthesia    pt's mother felt everything and couldn't speak during a gallbladder surgery   GERD (gastroesophageal reflux disease)    History of hiatal hernia    History of kidney stones    Hypertension    Pneumonia    a few times   TIA (transient ischemic attack) 2012    Medications:  Medications Prior to Admission  Medication Sig Dispense Refill Last Dose   albuterol (VENTOLIN HFA) 108 (90 Base) MCG/ACT inhaler Inhale 2 puffs into the lungs every 6 (six) hours as needed for wheezing or shortness of breath.      amLODipine (NORVASC) 5 MG tablet Take 1 tablet (5 mg total) by mouth daily. 90 tablet 3 02/01/2023   cetirizine (ZYRTEC) 10 MG tablet Take 10 mg by mouth daily.   02/01/2023   cyclobenzaprine (FLEXERIL) 10 MG tablet TAKE 1/2 TO 1 TABLET BY MOUTH 3 TIMES A DAY AS NEEDED FOR MUSCLE  SPASMS 90 tablet 1    EPINEPHrine (EPIPEN 2-PAK) 0.3 mg/0.3 mL IJ SOAJ injection Inject 0.3 mg into the muscle as needed for anaphylaxis. 2 each 0    HYDROcodone-acetaminophen (NORCO/VICODIN) 5-325 MG tablet Take 1 tablet by mouth every 4 (four) hours as needed for severe pain (PUT ON FILE). 30 tablet 0 Past Month   metoprolol succinate (TOPROL-XL) 25 MG 24 hr tablet Take 1 tablet (25 mg total) by mouth daily. 90 tablet 3 02/01/2023   rosuvastatin (CRESTOR) 10 MG tablet Take 1 tablet (10 mg total) by mouth every other day. 45 tablet 3 02/01/2023   sertraline (ZOLOFT) 100 MG tablet Take 1.5 tablets (150 mg total) by mouth daily. 90 tablet 3 02/01/2023   solifenacin (VESICARE) 5 MG tablet Take 1 tablet (5 mg total) by mouth daily. 30 tablet 3 02/01/2023   traZODone (DESYREL) 100 MG tablet TAKE 1 TO 2 TABLETS AT     BEDTIME AS NEEDED FOR SLEEP 180 tablet 3 02/01/2023    Assessment: Pharmacy consulted to dose heparin in patient with chest  pain/ACS.  Patient is not on anticoagulation prior to admission.    HL 0.37 CBC WNL Trop 116>202  Goal of Therapy:  Heparin level 0.3-0.7 units/ml Monitor platelets by anticoagulation protocol: Yes   Plan:  Continue heparin infusion at 1300 units/hr Check anti-Xa level in 6 hours and daily Continue to monitor H&H and platelets.   Judeth Cornfield, PharmD Clinical Pharmacist 02/03/2023 8:24 AM

## 2023-02-03 NOTE — Progress Notes (Signed)
[facility] night cross cover note:   I was notified by RN that the patient had a procedure involving the right wrist earlier today and that there was significant bleeding from this right wrist site throughout the day, with recent establishment of hemostasis.  No active bleeding reported at this time.  RN request clarification on whether or not to give tonight scheduled Brilinta as well as prophylactic dose Lovenox.   Per my brief chart review, it appears that the patient is here with NSTEMI.  Will give Brilinta this evening, as ordered, but will hold this evening's dose of Lovenox, which has been ordered for DVT prophylactic purposes.     Newton Pigg, DO Hospitalist

## 2023-02-03 NOTE — Progress Notes (Signed)
PROGRESS NOTE    Sydney [name]  GUY:[id number] DOB: 12-27-1960 DOA: 02/02/2023 PCP: Raliegh Ip, DO   Brief Narrative:    Sydney [name] is a 62 y.o. female with medical history significant for HTN, CVA, asthma, anxiety, goiter, peripheral vascular disease. Patient presented to the ED with complaints of chest pain yesterday 2 days ago, that woke her up from sleep.  She has been admitted for NSTEMI with plans to transfer to [facility] today for left heart catheterization.  Assessment & Plan:   Principal Problem:   NSTEMI (non-ST elevated myocardial infarction) (HCC) Active Problems:   Primary hypertension   Tobacco abuse  NSTEMI (non-ST elevated myocardial infarction) (HCC) Presenting with chest pains.  Troponin 65 >> 116 >> 290.  EKG with sinus rhythm, unchanged from prior.  Smokes cigarettes.  History of CVA/TIA.  Recent 11 road trip, D-dimer 0.63, CTA chest negative for PE. -Evaluated by cardiology-admitted here by hospitalist team, would benefit from transfer to [facility] for left heart cath, ACS protocol to include aspirin load followed by 81 mg once daily, heparin drip, high intensity statin, start Imdur 30 mg once daily. -EKG in a.m. -N.p.o. midnight - N/s 100cc/hr x 10hrs     Tobacco abuse Smokes half a pack of cigarettes daily.   - Counseled to quit smoking.  Patient voiced understanding -Nicotine patch   Primary hypertension Stable. -Resume metoprolol -Add Imdur as recommended by cardiology -Hold home Norvasc  Obesity BMI 38.61  DVT prophylaxis:Heparin drip Code Status: Full Family Communication: None at bedside Disposition Plan: Transfer to [facility] for LHC Status is: Observation The patient will require care spanning > 2 midnights and should be moved to inpatient because: Need for inpatient procedure.  Consultants:  Will go to cardiology service  Procedures:  None  Antimicrobials:  None   Subjective: Patient seen and evaluated today with no  new acute complaints or concerns. No acute concerns or events noted overnight.  Noted to have some mild bradycardia this morning for which metoprolol has been held.  She is anxious about having her heart catheterization today.  Objective: Vitals:   02/02/23 2152 02/03/23 0150 02/03/23 0515 02/03/23 0844  BP: (!) 126/59 123/65 (!) 100/56 128/69  Pulse: (!) 59 (!) 50  (!) 45  Resp: 20 20 18    Temp: 98.8 F (37.1 C) 98.6 F (37 C) 98 F (36.7 C)   TempSrc: Oral Oral    SpO2: 96% 97% 95%   Weight:      Height:        Intake/Output Summary (Last 24 hours) at 02/03/2023 0847 Last data filed at 02/03/2023 0500 Gross per 24 hour  Intake 398.9 ml  Output --  Net 398.9 ml   Filed Weights   02/02/23 1615  Weight: 105.2 kg    Examination:  General exam: Appears calm and comfortable  Respiratory system: Clear to auscultation. Respiratory effort normal. Cardiovascular system: S1 & S2 heard, RRR.  Gastrointestinal system: Abdomen is soft Central nervous system: Alert and awake Extremities: No edema Skin: No significant lesions noted Psychiatry: Flat affect.    Data Reviewed: I have personally reviewed following labs and imaging studies  CBC: Recent Labs  Lab 02/02/23 1153 02/03/23 0457  WBC 5.9 5.2  NEUTROABS 4.5  --   HGB 14.2 12.6  HCT 43.8 38.6  MCV 92.0 93.9  PLT 207 179   Basic Metabolic Panel: Recent Labs  Lab 02/02/23 1153 02/03/23 0457  NA 136 142  K 3.8 4.2  CL  104 108  CO2 24 27  GLUCOSE 95 104*  BUN 10 12  CREATININE 0.58 0.62  CALCIUM 8.7* 8.6*   GFR: Estimated Creatinine Clearance: 86.7 mL/min (by C-G formula based on SCr of 0.62 mg/dL). Liver Function Tests: No results for input(s): "AST", "ALT", "ALKPHOS", "BILITOT", "PROT", "ALBUMIN" in the last 168 hours. No results for input(s): "LIPASE", "AMYLASE" in the last 168 hours. No results for input(s): "AMMONIA" in the last 168 hours. Coagulation Profile: No results for input(s): "INR",  "PROTIME" in the last 168 hours. Cardiac Enzymes: No results for input(s): "CKTOTAL", "CKMB", "CKMBINDEX", "TROPONINI" in the last 168 hours. BNP (last 3 results) No results for input(s): "PROBNP" in the last 8760 hours. HbA1C: No results for input(s): "HGBA1C" in the last 72 hours. CBG: No results for input(s): "GLUCAP" in the last 168 hours. Lipid Profile: No results for input(s): "CHOL", "HDL", "LDLCALC", "TRIG", "CHOLHDL", "LDLDIRECT" in the last 72 hours. Thyroid Function Tests: No results for input(s): "TSH", "T4TOTAL", "FREET4", "T3FREE", "THYROIDAB" in the last 72 hours. Anemia Panel: No results for input(s): "VITAMINB12", "FOLATE", "FERRITIN", "TIBC", "IRON", "RETICCTPCT" in the last 72 hours. Sepsis Labs: No results for input(s): "PROCALCITON", "LATICACIDVEN" in the last 168 hours.  No results found for this or any previous visit (from the past 240 hour(s)).       Radiology Studies: CT Angio Chest PE W and/or Wo Contrast  Result Date: 02/02/2023 CLINICAL DATA:  Left-sided chest pain and shortness of breath. EXAM: CT ANGIOGRAPHY CHEST WITH CONTRAST TECHNIQUE: Multidetector CT imaging of the chest was performed using the standard protocol during bolus administration of intravenous contrast. Multiplanar CT image reconstructions and MIPs were obtained to evaluate the vascular anatomy. RADIATION DOSE REDUCTION: This exam was performed according to the departmental dose-optimization program which includes automated exposure control, adjustment of the mA and/or kV according to patient size and/or use of iterative reconstruction technique. CONTRAST:  75mL OMNIPAQUE IOHEXOL 300 MG/ML  SOLN COMPARISON:  None Available. FINDINGS: Cardiovascular: Satisfactory opacification of the pulmonary arteries to the segmental level. No evidence of pulmonary embolism. Normal heart size. No pericardial effusion. Calcific atherosclerotic disease of the coronary arteries. Mediastinum/Nodes: No enlarged  mediastinal, hilar, or axillary lymph nodes. Thyroid gland, trachea, and esophagus demonstrate no significant findings. Lungs/Pleura: Lungs are clear. No pleural effusion or pneumothorax. Upper Abdomen: No acute abnormality. Musculoskeletal: Kyphosis and spondylosis of the thoracic spine. Review of the MIP images confirms the above findings. IMPRESSION: 1. No evidence of pulmonary embolism. 2. Calcific atherosclerotic disease of the coronary arteries. 3. Aortic atherosclerosis. Aortic Atherosclerosis (ICD10-I70.0). Electronically Signed   By: Ted Mcalpine M.D.   On: 02/02/2023 14:43   DG Chest Portable 1 View  Result Date: 02/02/2023 CLINICAL DATA:  Chest pain EXAM: PORTABLE CHEST 1 VIEW COMPARISON:  Chest radiograph 12/02/2019 FINDINGS: The heart is at the upper limits of normal for size. The upper mediastinal contours are normal. There is no focal consolidation or pulmonary edema. There is no pleural effusion or pneumothorax There is no acute osseous abnormality. Cervical spine fusion hardware is noted. IMPRESSION: No radiographic evidence of acute cardiopulmonary process. Electronically Signed   By: Lesia Hausen M.D.   On: 02/02/2023 12:47        Scheduled Meds:  aspirin  81 mg Oral Pre-Cath   aspirin EC  81 mg Oral Daily   isosorbide mononitrate  30 mg Oral Daily   nicotine  7 mg Transdermal Daily   rosuvastatin  10 mg Oral QODAY   Continuous Infusions:  sodium chloride 100 mL/hr at 02/03/23 0832   heparin 1,300 Units/hr (02/03/23 0043)     LOS: 0 days    Time spent: 35 minutes    Abria Vannostrand Sydney Brunette, DO [facility]  If 7PM-7AM, please contact night-coverage [email or web address] 02/03/2023, 8:47 AM

## 2023-02-03 NOTE — Progress Notes (Addendum)
Called into pt's room with c/o chest and neck pain 8:10. MD notified. EKG obtained, and placed in chart. Read sinus brady. Vitals were stable. Pt complained again of chest and neck pain. MD again notified. Nitroglycerin ordered and administered. Pt's pain is now 5:10. When getting ready to leave pt's room pt states chest and neck pain is back and is now a 9:10. Another dose of nitroglycerin administered.

## 2023-02-03 NOTE — Progress Notes (Addendum)
   Patient Name: Sydney [name] Date of Encounter: 02/03/2023 [name] Cardiologist: None    Interval Summary  .    Felt heart fluttering last night. No further chest pain. Anxious about cath.  Vital Signs .    Vitals:   02/02/23 2010 02/02/23 2152 02/03/23 0150 02/03/23 0515  BP:  (!) 126/59 123/65 (!) 100/56  Pulse:  (!) 59 (!) 50   Resp:  20 20 18   Temp:  98.8 F (37.1 C) 98.6 F (37 C) 98 F (36.7 C)  TempSrc:  Oral Oral   SpO2: 96% 96% 97% 95%  Weight:      Height:        Intake/Output Summary (Last 24 hours) at 02/03/2023 0816 Last data filed at 02/03/2023 0500 Gross per 24 hour  Intake 398.9 ml  Output --  Net 398.9 ml      02/02/2023    4:15 PM 01/08/2023   10:56 AM 11/06/2022    1:47 PM  Last 3 Weights  Weight (lbs) 232 lb 232 lb 239 lb  Weight (kg) 105.235 kg 105.235 kg 108.41 kg      Telemetry/ECG    Sinus brady 40's - Personally Reviewed  Physical Exam .   GEN: No acute distress.   Neck: No JVD Cardiac:  RRR, no murmurs, rubs, or gallops.  Respiratory: Clear to auscultation bilaterally. GI: Soft, nontender, non-distended  MS: No edema  Assessment & Plan .     NSTEMI trop [phone] on IV heparin, Imdur, metoprolol, crestor, will hold metoprolol due to bradycardia. HA on imdur. Transfer to [facility] on our service today for LHC. Risks/benefits reviewed yest by Dr. Jenene [name].   HTN-BP low normal-hold metoprolol  Tobacco abuse-smoking cessation  Sinus bradycardia 40's-hold metoprolol for now.  HLD-check FLP   For questions or updates, please contact Sydney [name] Please consult [email or web address] for contact info under        Signed, Sydney Reedy, PA-C       Attending attestation  No acute events overnight.  She started to complain of jaw pain followed by chest pain radiating to her left arm this morning requiring 3 sublingual nitroglycerin. Pain did not resolve.  Nitroglycerin drip was recommended but since patient  was on the floor, nitro patch was started. She is pending transfer to [facility] today for NSTEMI.  Physical examination is remarkable for patient in mild distress, HEENT normal, no JVD, S1-S2 normal, clear lungs, abdomen NT and ND, no pitting edema in bilateral lower extremities. Continue ACS protocol for NSTEMI and transfer to [facility] for LHC. Hold beta-blocker due to severe sinus bradycardia.  Had headaches with Imdur, hence discontinued. But okay to use nitro patch as she is having chest pains and administer Tylenol for headaches. Nicotine patches for smoking withdrawal.   Sydney Critz Verne Spurr, MD [place]  [name]  10:43 AM

## 2023-02-04 ENCOUNTER — Encounter (HOSPITAL_COMMUNITY): Payer: Self-pay | Admitting: Cardiology

## 2023-02-04 ENCOUNTER — Other Ambulatory Visit (HOSPITAL_COMMUNITY): Payer: Self-pay

## 2023-02-04 DIAGNOSIS — I214 Non-ST elevation (NSTEMI) myocardial infarction: Secondary | ICD-10-CM | POA: Diagnosis not present

## 2023-02-04 LAB — BASIC METABOLIC PANEL
Anion gap: 5 (ref 5–15)
BUN: 7 mg/dL — ABNORMAL LOW (ref 8–23)
CO2: 23 mmol/L (ref 22–32)
Calcium: 8.2 mg/dL — ABNORMAL LOW (ref 8.9–10.3)
Chloride: 109 mmol/L (ref 98–111)
Creatinine, Ser: 0.61 mg/dL (ref 0.44–1.00)
GFR, Estimated: >60 mL/min (ref >60)
Glucose, Bld: 108 mg/dL — ABNORMAL HIGH (ref 70–99)
Potassium: 4 mmol/L (ref 3.5–5.1)
Sodium: 137 mmol/L (ref 135–145)

## 2023-02-04 LAB — CBC
HCT: 36.8 % (ref 36.0–46.0)
Hemoglobin: 11.8 g/dL — ABNORMAL LOW (ref 12.0–15.0)
MCH: 29.7 pg (ref 26.0–34.0)
MCHC: 32.1 g/dL (ref 30.0–36.0)
MCV: 92.7 fL (ref 80.0–100.0)
Platelets: 173 10*3/uL (ref 150–400)
RBC: 3.97 MIL/uL (ref 3.87–5.11)
RDW: 13.8 % (ref 11.5–15.5)
WBC: 6.4 10*3/uL (ref 4.0–10.5)
nRBC: 0 % (ref 0.0–0.2)

## 2023-02-04 LAB — MAGNESIUM: Magnesium: 1.8 mg/dL (ref 1.7–2.4)

## 2023-02-04 MED ORDER — AMLODIPINE BESYLATE 5 MG PO TABS
5.0000 mg | ORAL_TABLET | Freq: Every day | ORAL | Status: DC
  Administered 2023-02-04: 5 mg via ORAL
  Filled 2023-02-04: qty 1

## 2023-02-04 MED ORDER — TICAGRELOR 90 MG PO TABS: 90.0000 mg | ORAL_TABLET | Freq: Two times a day (BID) | ORAL | 1 refills | Status: DC

## 2023-02-04 MED ORDER — ASPIRIN 81 MG PO TBEC: 81.0000 mg | DELAYED_RELEASE_TABLET | Freq: Every day | ORAL | 12 refills | Status: DC

## 2023-02-04 MED ORDER — ATORVASTATIN CALCIUM 40 MG PO TABS
40.0000 mg | ORAL_TABLET | Freq: Every day | ORAL | Status: DC
  Administered 2023-02-04: 40 mg via ORAL
  Filled 2023-02-04: qty 1

## 2023-02-04 MED ORDER — TRAZODONE HCL 100 MG PO TABS: 150.0000 mg | ORAL_TABLET | Freq: Every day | ORAL | Status: DC

## 2023-02-04 MED ORDER — ATORVASTATIN CALCIUM 40 MG PO TABS: 40.0000 mg | ORAL_TABLET | Freq: Every day | ORAL | 0 refills | Status: DC

## 2023-02-04 MED ORDER — SERTRALINE HCL 50 MG PO TABS
150.0000 mg | ORAL_TABLET | Freq: Every day | ORAL | Status: DC
  Administered 2023-02-04: 150 mg via ORAL
  Filled 2023-02-04: qty 1

## 2023-02-04 NOTE — Plan of Care (Signed)

## 2023-02-04 NOTE — Progress Notes (Signed)
CARDIAC REHAB PHASE I   PRE:  Rate/Rhythm: 52 NSR  BP:  Sitting: 140/95      SaO2: 97 RA  MODE:  Ambulation: 270 ft   AD:   Cane  POST:  Rate/Rhythm: 76 NSR  BP:  Sitting: 152/80      SaO2: 96 RA  Pt amb with supervision assistance, pt denies CP and reports min SOB during amb and was returned to room w/o complaint.   Pt walked well.  Pt was educated on stent card, stent location, Antiplatelet and ASA use, wt restrictions, no baths/daily wash-ups, s/s of infection, ex guidelines, s/s to stop exercising, NTG use and calling 911, heart healthy diet, risk factors and CRPII. Pt received MI book and materials on exercise, diet, and CRPII. Will refer to AP.   Pt reports smoking 1/2 pack, will try to quit smoking.   Faustino Congress  ACSM-CEP 9:08 AM 02/04/2023    Service time is from 0805 to 0910.

## 2023-02-04 NOTE — Discharge Summary (Signed)
Physician Discharge Summary  Robin Petrakis OLM:[id number] DOB: [birth year] DOA: 02/02/2023  PCP: Raliegh Ip, DO  Admit date: 02/02/2023  Discharge date: 02/04/2023  Admitted From:Home  Disposition:  Home  Recommendations for Outpatient Follow-up:  Follow up with PCP in 1-2 weeks Follow-up with cardiology Dr. Mikal Plane with referral sent Remains on aspirin and Brilinta as prescribed as well as atorvastatin Okay to resume amlodipine as well as other home medications  Home Health: None  Equipment/Devices: None  Discharge Condition:Stable  CODE STATUS: Full  Diet recommendation: Heart Healthy  Brief/Interim Summary: Sydney [name] is a 62 y.o. female with medical history significant for HTN, CVA, asthma, anxiety, goiter, peripheral vascular disease. Patient presented to the ED with complaints of chest pain yesterday 2 days ago, that woke her up from sleep.  She had been admitted for NSTEMI and was transferred to [facility] for left heart catheterization which she underwent on 7/16.  She was noted to have 60% mid-dis Cx, and 95% dist Cx lesions with successful PCI to both lesions and now has been started on aspirin and Brilinta.  She has also been switched from Crestor to atorvastatin due to reports of insomnia.  She has been seen by cardiology and is in stable condition for discharge with outpatient follow-up to cardiology sent.  Discharge Diagnoses:  Principal Problem:   NSTEMI (non-ST elevated myocardial infarction) [facility]) Active Problems:   Primary hypertension   Nicotine abuse  Principal discharge diagnosis: NSTEMI status post successful PCI 7/16.  Discharge Instructions  Discharge Instructions     Amb Referral to Cardiac Rehabilitation   Complete by: As directed    Diagnosis:  Coronary Stents NSTEMI     After initial evaluation and assessments completed: Virtual Based Care may be provided alone or in conjunction with Phase 2 Cardiac Rehab based on patient  barriers.: Yes   Intensive Cardiac Rehabilitation (ICR) [facility] location only OR Traditional Cardiac Rehabilitation (TCR) *If criteria for ICR are not met will enroll in TCR [facility] only): Yes   Ambulatory referral to Cardiology   Complete by: As directed    Diet - low sodium heart healthy   Complete by: As directed    Increase activity slowly   Complete by: As directed       Allergies as of 02/04/2023       Reactions   Bee Pollen Anaphylaxis, Swelling   Bee Venom Anaphylaxis, Swelling   Butorphanol Other (See Comments)   Not sure told by md she was allergic after surgery.   Meloxicam Anxiety   Mood disorder Altered her personality   Penicillins Anaphylaxis, Hives   Unable to Recall As child mom was told she is highly allergic Did it involve swelling of the face/tongue/throat, SOB, or low BP? Unknown Did it involve sudden or severe rash/hives, skin peeling, or any reaction on the inside of your mouth or nose? Unknown Did you need to seek medical attention at a hospital or doctor's office? Unknown When did it last happen?      childhood allergy If all above answers are "NO", may proceed with cephalosporin use.   Prochlorperazine Edisylate Anaphylaxis   Sulfa Antibiotics Anaphylaxis   Unable to Recall   Tramadol Anxiety   Didn't like the way it made her feel   Rosuvastatin Other (See Comments)   Per pt, keeps her awake when she takes rosuvastatin daily so decreased to every other day    Clindamycin Hives   Levaquin [levofloxacin] Itching   Topiramate Nausea And Vomiting  Doxycycline Dermatitis, Hives, Itching, Photosensitivity, Rash        Medication List     STOP taking these medications    rosuvastatin 10 MG tablet Commonly known as: CRESTOR       TAKE these medications    albuterol 108 (90 Base) MCG/ACT inhaler Commonly known as: VENTOLIN HFA Inhale 2 puffs into the lungs every 6 (six) hours as needed for wheezing or shortness of breath.   amLODipine 5 MG  tablet Commonly known as: NORVASC Take 1 tablet (5 mg total) by mouth daily.   aspirin EC 81 MG tablet Take 1 tablet (81 mg total) by mouth daily. Swallow whole. Start taking on: February 05, 2023   atorvastatin 40 MG tablet Commonly known as: LIPITOR Take 1 tablet (40 mg total) by mouth daily. Start taking on: February 05, 2023   cetirizine 10 MG tablet Commonly known as: ZYRTEC Take 10 mg by mouth daily.   cyclobenzaprine 10 MG tablet Commonly known as: FLEXERIL TAKE 1/2 TO 1 TABLET BY MOUTH 3 TIMES A DAY AS NEEDED FOR MUSCLE SPASMS   EPINEPHrine 0.3 mg/0.3 mL Soaj injection Commonly known as: EpiPen 2-Pak Inject 0.3 mg into the muscle as needed for anaphylaxis.   HYDROcodone-acetaminophen 5-325 MG tablet Commonly known as: NORCO/VICODIN Take 1 tablet by mouth every 4 (four) hours as needed for severe pain (PUT ON FILE).   metoprolol succinate 25 MG 24 hr tablet Commonly known as: TOPROL-XL Take 1 tablet (25 mg total) by mouth daily.   sertraline 100 MG tablet Commonly known as: ZOLOFT Take 1.5 tablets (150 mg total) by mouth daily.   solifenacin 5 MG tablet Commonly known as: VESICARE Take 1 tablet (5 mg total) by mouth daily.   ticagrelor 90 MG Tabs tablet Commonly known as: BRILINTA Take 1 tablet (90 mg total) by mouth 2 (two) times daily.   traZODone 100 MG tablet Commonly known as: DESYREL TAKE 1 TO 2 TABLETS AT     BEDTIME AS NEEDED FOR SLEEP        Follow-up Information     Delynn Flavin M, DO. Schedule an appointment as soon as possible for a visit in 1 week(s).   Specialty: Family Medicine Contact information: [address] [place] [address] [phone]         Marjo Bicker, MD. Go to.   Specialties: Cardiology, Internal Medicine Contact information: [address] [place] [address] [phone]                Allergies  Allergen Reactions   Bee Pollen Anaphylaxis and Swelling   Bee Venom Anaphylaxis and Swelling    Butorphanol Other (See Comments)    Not sure told by md she was allergic after surgery.    Meloxicam Anxiety    Mood disorder Altered her personality    Penicillins Anaphylaxis and Hives    Unable to Recall As child mom was told she is highly allergic Did it involve swelling of the face/tongue/throat, SOB, or low BP? Unknown Did it involve sudden or severe rash/hives, skin peeling, or any reaction on the inside of your mouth or nose? Unknown Did you need to seek medical attention at a hospital or doctor's office? Unknown When did it last happen?      childhood allergy If all above answers are "NO", may proceed with cephalosporin use.    Prochlorperazine Edisylate Anaphylaxis   Sulfa Antibiotics Anaphylaxis    Unable to Recall   Tramadol Anxiety  Didn't like the way it made her feel    Rosuvastatin Other (See Comments)    Per pt, keeps her awake when she takes rosuvastatin daily so decreased to every other day    Clindamycin Hives   Levaquin [Levofloxacin] Itching   Topiramate Nausea And Vomiting   Doxycycline Dermatitis, Hives, Itching, Photosensitivity and Rash    Consultations: Cardiology   Procedures/Studies: CARDIAC CATHETERIZATION  Result Date: 02/03/2023   CULPRIT LESION SEGMENT: Mid Cx to Dist Cx lesion is 60% stenosed.  Dist Cx lesion is 95% stenosed.   A drug-eluting stent was successfully placed covering both lesions, using a SYNERGY XD [id number] -> deployed to 2.45 mm with stent balloon.  Post intervention, there is a 0% residual stenosis for both lesions.TIMI-3 flow pre and post   -----------------------------------------------------------------   Post intervention, there is a 0% residual stenosis.   There is hyperdynamic left ventricular systolic function.  The left ventricular ejection fraction is greater than 65% by visual estimate.   LV end diastolic pressure is normal.   There is no aortic valve stenosis. 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 TIMI-3 flow pre and post. Otherwise large caliber codominant RCA with large PDA and large RV marginal branch with minimal disease, and very large caliber LAD with equally large major diagonal branch also with minimal disease in the LAD. Hyperdynamic LV with EF > 65%, no RWMA. LVEDP 7 mmHg. Patient has chronic chest discomfort which was not similar to the symptoms noted when she had pretty classic anginal pain in the jaw and throat/neck with PTCA and stent placement.  Chronic pain is a 2/10, anginal pain is a 0/10. RECOMMENDATIONS In the absence of any other complications or medical issues, we expect the patient to be ready for discharge from an interventional cardiology perspective on 02/04/2023. Recommend uninterrupted dual antiplatelet therapy with Aspirin 81mg  daily and Ticagrelor 90mg  twice daily for a minimum of 12 months (ACS-Class I recommendation). Given the difficulty of stenting this vessel, would recommend at least another year of antiplatelet agent monotherapy with either maintenance dose Brilinta 60 mg twice daily or Plavix 75 mg daily that can be interrupted. Bryan Lemma, MD   CT Angio Chest PE W and/or Wo Contrast  Result Date: 02/02/2023 CLINICAL DATA:  Left-sided chest pain and shortness of breath. EXAM: CT ANGIOGRAPHY CHEST WITH CONTRAST TECHNIQUE: Multidetector CT imaging of the chest was performed using the standard protocol during bolus administration of intravenous contrast. Multiplanar CT image reconstructions and MIPs were obtained to evaluate the vascular anatomy. RADIATION DOSE REDUCTION: This exam was performed according to the departmental dose-optimization program which includes automated exposure control, adjustment of the mA and/or kV according to patient size and/or use of iterative reconstruction  technique. CONTRAST:  75mL OMNIPAQUE IOHEXOL 300 MG/ML  SOLN COMPARISON:  None Available. FINDINGS: Cardiovascular: Satisfactory opacification of the pulmonary arteries to the segmental level. No evidence of pulmonary embolism. Normal heart size. No pericardial effusion. Calcific atherosclerotic disease of the coronary arteries. Mediastinum/Nodes: No enlarged mediastinal, hilar, or axillary lymph nodes. Thyroid gland, trachea, and esophagus demonstrate no significant findings. Lungs/Pleura: Lungs are clear. No pleural effusion or pneumothorax. Upper Abdomen: No acute abnormality. Musculoskeletal: Kyphosis and spondylosis of the thoracic spine. Review of the MIP images confirms the above findings. IMPRESSION: 1. No evidence of pulmonary embolism.  2. Calcific atherosclerotic disease of the coronary arteries. 3. Aortic atherosclerosis. Aortic Atherosclerosis (ICD10-I70.0). Electronically Signed   By: Ted Mcalpine M.D.   On: 02/02/2023 14:43   DG Chest Portable 1 View  Result Date: 02/02/2023 CLINICAL DATA:  Chest pain EXAM: PORTABLE CHEST 1 VIEW COMPARISON:  Chest radiograph 12/02/2019 FINDINGS: The heart is at the upper limits of normal for size. The upper mediastinal contours are normal. There is no focal consolidation or pulmonary edema. There is no pleural effusion or pneumothorax There is no acute osseous abnormality. Cervical spine fusion hardware is noted. IMPRESSION: No radiographic evidence of acute cardiopulmonary process. Electronically Signed   By: Lesia Hausen M.D.   On: 02/02/2023 12:47     Discharge Exam: Vitals:   02/04/23 0801 02/04/23 1155  BP: (!) 140/95 (!) 158/88  Pulse: (!) 52   Resp: 19 18  Temp: 98 F (36.7 C)   SpO2: 96%    Vitals:   02/03/23 2348 02/04/23 0533 02/04/23 0801 02/04/23 1155  BP: (!) 126/49 (!) 149/76 (!) 140/95 (!) 158/88  Pulse: (!) 51 (!) 52 (!) 52   Resp: 16 17 19 18   Temp: 98.1 F (36.7 C) 97.9 F (36.6 C) 98 F (36.7 C)   TempSrc: Oral Oral  Oral   SpO2: 94% 94% 96%   Weight:      Height:        General: Pt is alert, awake, not in acute distress Cardiovascular: RRR, S1/S2 +, no rubs, no gallops Respiratory: CTA bilaterally, no wheezing, no rhonchi Abdominal: Soft, NT, ND, bowel sounds + Extremities: no edema, no cyanosis    The results of significant diagnostics from this hospitalization (including imaging, microbiology, ancillary and laboratory) are listed below for reference.     Microbiology: No results found for this or any previous visit (from the past 240 hour(s)).   Labs: BNP (last 3 results) No results for input(s): "BNP" in the last 8760 hours. Basic Metabolic Panel: Recent Labs  Lab 02/02/23 1153 02/03/23 0457 02/04/23 0009  NA 136 142 137  K 3.8 4.2 4.0  CL 104 108 109  CO2 24 27 23   GLUCOSE 95 104* 108*  BUN 10 12 7*  CREATININE 0.58 0.62 0.61  CALCIUM 8.7* 8.6* 8.2*  MG  --   --  1.8   Liver Function Tests: No results for input(s): "AST", "ALT", "ALKPHOS", "BILITOT", "PROT", "ALBUMIN" in the last 168 hours. No results for input(s): "LIPASE", "AMYLASE" in the last 168 hours. No results for input(s): "AMMONIA" in the last 168 hours. CBC: Recent Labs  Lab 02/02/23 1153 02/03/23 0457 02/04/23 0009  WBC 5.9 5.2 6.4  NEUTROABS 4.5  --   --   HGB 14.2 12.6 11.8*  HCT 43.8 38.6 36.8  MCV 92.0 93.9 92.7  PLT 207 179 173   Cardiac Enzymes: No results for input(s): "CKTOTAL", "CKMB", "CKMBINDEX", "TROPONINI" in the last 168 hours. BNP: Invalid input(s): "POCBNP" CBG: No results for input(s): "GLUCAP" in the last 168 hours. D-Dimer Recent Labs    02/02/23 1153  DDIMER 0.63*   Hgb A1c No results for input(s): "HGBA1C" in the last 72 hours. Lipid Profile Recent Labs    02/03/23 0457  CHOL 114  HDL 34*  LDLCALC 64  TRIG 80  CHOLHDL 3.4   Thyroid function studies No results for input(s): "TSH", "T4TOTAL", "T3FREE", "THYROIDAB" in the last 72 hours.  Invalid input(s):  "FREET3" Anemia work up No results for input(s): "VITAMINB12", "FOLATE", "FERRITIN", "TIBC", "IRON", "RETICCTPCT"  in the last 72 hours. Urinalysis    Component Value Date/Time   COLORURINE STRAW (A) 12/02/2019 1121   APPEARANCEUR Clear 01/14/2023 1132   LABSPEC 1.005 12/02/2019 1121   PHURINE 6.0 12/02/2019 1121   GLUCOSEU Negative 01/14/2023 1132   HGBUR SMALL (A) 12/02/2019 1121   BILIRUBINUR Negative 01/14/2023 1132   KETONESUR NEGATIVE 12/02/2019 1121   PROTEINUR Negative 01/14/2023 1132   PROTEINUR NEGATIVE 12/02/2019 1121   NITRITE Negative 01/14/2023 1132   NITRITE NEGATIVE 12/02/2019 1121   LEUKOCYTESUR Negative 01/14/2023 1132   LEUKOCYTESUR NEGATIVE 12/02/2019 1121   Sepsis Labs Recent Labs  Lab 02/02/23 1153 02/03/23 0457 02/04/23 0009  WBC 5.9 5.2 6.4   Microbiology No results found for this or any previous visit (from the past 240 hour(s)).   Time coordinating discharge: 35 minutes  SIGNED:   Erick Blinks, DO [facility] 02/04/2023, 12:13 PM  If 7PM-7AM, please contact night-coverage [email or web address]

## 2023-02-04 NOTE — Plan of Care (Signed)

## 2023-02-04 NOTE — TOC Benefit Eligibility Note (Signed)
Pharmacy Patient Advocate Encounter  Insurance verification completed.    The patient is insured through [facility]    Ran test claim for Brilinta 90mg  and the current 30 day co-pay is $25 (including eVoucher that pays $45 toward copay).   This test claim was processed through [facility]- copay amounts may vary at other pharmacies due to pharmacy/plan contracts, or as the patient moves through the different stages of their insurance plan.

## 2023-02-04 NOTE — Progress Notes (Signed)
Rounding Note    Patient Name: Sydney [name] Date of Encounter: 02/04/2023  [facility] Cardiologist: Marjo Bicker, MD   Subjective   No complaints, no chest pain  Inpatient Medications    Scheduled Meds:  aspirin EC  81 mg Oral Daily   enoxaparin (LOVENOX) injection  50 mg Subcutaneous QHS   nicotine  7 mg Transdermal Daily   rosuvastatin  10 mg Oral QODAY   sodium chloride flush  3 mL Intravenous Q12H   ticagrelor  90 mg Oral BID   Continuous Infusions:  sodium chloride     nitroGLYCERIN Stopped (02/03/23 1333)   PRN Meds: sodium chloride, acetaminophen **OR** acetaminophen, ALPRAZolam, ondansetron **OR** ondansetron (ZOFRAN) IV, mouth rinse, polyethylene glycol, sodium chloride flush   Vital Signs    Vitals:   02/03/23 1942 02/03/23 2348 02/04/23 0533 02/04/23 0801  BP: 114/65 (!) 126/49 (!) 149/76 (!) 140/95  Pulse:  (!) 51 (!) 52 (!) 52  Resp:  16 17 19   Temp:  98.1 F (36.7 C) 97.9 F (36.6 C) 98 F (36.7 C)  TempSrc: Oral Oral Oral Oral  SpO2:  94% 94% 96%  Weight:      Height:        Intake/Output Summary (Last 24 hours) at 02/04/2023 1024 Last data filed at 02/04/2023 0517 Gross per 24 hour  Intake 13.4 ml  Output --  Net 13.4 ml      02/03/2023   11:56 AM 02/02/2023    4:15 PM 01/08/2023   10:56 AM  Last 3 Weights  Weight (lbs) 230 lb 232 lb 232 lb  Weight (kg) 104.327 kg 105.235 kg 105.235 kg      Telemetry    Sinus Rhythm HR 50s - Personally Reviewed  ECG    Sinus Rhythm, no significant ST changes. HR 46  - Personally Reviewed  Physical Exam   GEN: No acute distress.   Cardiac: RRR, no murmurs, rubs, or gallops.  Respiratory: Clear to auscultation bilaterally. GI: Soft, nontender, non-distended  MS: No edema; No deformity. Rt radial site stable Neuro:  Nonfocal  Psych: Normal affect   Labs    High Sensitivity Troponin:   Recent Labs  Lab 02/02/23 1153 02/02/23 1330 02/02/23 1845 02/03/23 0457   TROPONINIHS 65* 116* 290* 202*     Chemistry Recent Labs  Lab 02/02/23 1153 02/03/23 0457 02/04/23 0009  NA 136 142 137  K 3.8 4.2 4.0  CL 104 108 109  CO2 24 27 23   GLUCOSE 95 104* 108*  BUN 10 12 7*  CREATININE 0.58 0.62 0.61  CALCIUM 8.7* 8.6* 8.2*  MG  --   --  1.8  GFRNONAA >60 >60 >60  ANIONGAP 8 7 5     Lipids  Recent Labs  Lab 02/03/23 0457  CHOL 114  TRIG 80  HDL 34*  LDLCALC 64  CHOLHDL 3.4    Hematology Recent Labs  Lab 02/02/23 1153 02/03/23 0457 02/04/23 0009  WBC 5.9 5.2 6.4  RBC 4.76 4.11 3.97  HGB 14.2 12.6 11.8*  HCT 43.8 38.6 36.8  MCV 92.0 93.9 92.7  MCH 29.8 30.7 29.7  MCHC 32.4 32.6 32.1  RDW 13.7 13.7 13.8  PLT 207 179 173   DDimer  Recent Labs  Lab 02/02/23 1153  DDIMER 0.63*     Radiology    CARDIAC CATHETERIZATION  Result Date: 02/03/2023   CULPRIT LESION SEGMENT: Mid Cx to Dist Cx lesion is 60% stenosed.  Dist Cx lesion is 95%  stenosed.   A drug-eluting stent was successfully placed covering both lesions, using a SYNERGY XD 2.25X16 -> deployed to 2.45 mm with stent balloon.  Post intervention, there is a 0% residual stenosis for both lesions.TIMI-3 flow pre and post   -----------------------------------------------------------------   Post intervention, there is a 0% residual stenosis.   There is hyperdynamic left ventricular systolic function.  The left ventricular ejection fraction is greater than 65% by visual estimate.   LV end diastolic pressure is normal.   There is no aortic valve stenosis. 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 TIMI-3 flow pre and post. Otherwise large caliber codominant RCA with large PDA and large RV marginal branch with minimal disease, and very large caliber LAD with equally large major diagonal branch also  with minimal disease in the LAD. Hyperdynamic LV with EF > 65%, no RWMA. LVEDP 7 mmHg. Patient has chronic chest discomfort which was not similar to the symptoms noted when she had pretty classic anginal pain in the jaw and throat/neck with PTCA and stent placement.  Chronic pain is a 2/10, anginal pain is a 0/10. RECOMMENDATIONS In the absence of any other complications or medical issues, we expect the patient to be ready for discharge from an interventional cardiology perspective on 02/04/2023. Recommend uninterrupted dual antiplatelet therapy with Aspirin 81mg  daily and Ticagrelor 90mg  twice daily for a minimum of 12 months (ACS-Class I recommendation). Given the difficulty of stenting this vessel, would recommend at least another year of antiplatelet agent monotherapy with either maintenance dose Brilinta 60 mg twice daily or Plavix 75 mg daily that can be interrupted. Bryan Lemma, MD   CT Angio Chest PE W and/or Wo Contrast  Result Date: 02/02/2023 CLINICAL DATA:  Left-sided chest pain and shortness of breath. EXAM: CT ANGIOGRAPHY CHEST WITH CONTRAST TECHNIQUE: Multidetector CT imaging of the chest was performed using the standard protocol during bolus administration of intravenous contrast. Multiplanar CT image reconstructions and MIPs were obtained to evaluate the vascular anatomy. RADIATION DOSE REDUCTION: This exam was performed according to the departmental dose-optimization program which includes automated exposure control, adjustment of the mA and/or kV according to patient size and/or use of iterative reconstruction technique. CONTRAST:  75mL OMNIPAQUE IOHEXOL 300 MG/ML  SOLN COMPARISON:  None Available. FINDINGS: Cardiovascular: Satisfactory opacification of the pulmonary arteries to the segmental level. No evidence of pulmonary embolism. Normal heart size. No pericardial effusion. Calcific atherosclerotic disease of the coronary arteries. Mediastinum/Nodes: No enlarged mediastinal, hilar, or  axillary lymph nodes. Thyroid gland, trachea, and esophagus demonstrate no significant findings. Lungs/Pleura: Lungs are clear. No pleural effusion or pneumothorax. Upper Abdomen: No acute abnormality. Musculoskeletal: Kyphosis and spondylosis of the thoracic spine. Review of the MIP images confirms the above findings. IMPRESSION: 1. No evidence of pulmonary embolism. 2. Calcific atherosclerotic disease of the coronary arteries. 3. Aortic atherosclerosis. Aortic Atherosclerosis (ICD10-I70.0). Electronically Signed   By: Ted Mcalpine M.D.   On: 02/02/2023 14:43   DG Chest Portable 1 View  Result Date: 02/02/2023 CLINICAL DATA:  Chest pain EXAM: PORTABLE CHEST 1 VIEW COMPARISON:  Chest radiograph 12/02/2019 FINDINGS: The heart is at the upper limits of normal for size. The upper mediastinal contours are normal. There is no focal consolidation or pulmonary edema. There is no pleural effusion or pneumothorax There is no acute osseous abnormality.  Cervical spine fusion hardware is noted. IMPRESSION: No radiographic evidence of acute cardiopulmonary process. Electronically Signed   By: Lesia Hausen M.D.   On: 02/02/2023 12:47    Cardiac Studies   LHC 02/03/23   CULPRIT LESION SEGMENT: Mid Cx to Dist Cx lesion is 60% stenosed.  Dist Cx lesion is 95% stenosed.   A drug-eluting stent was successfully placed covering both lesions, using a SYNERGY XD 2.25X16 -> deployed to 2.45 mm with stent balloon.  Post intervention, there is a 0% residual stenosis for both lesions.TIMI-3 flow pre and post   -----------------------------------------------------------------   Post intervention, there is a 0% residual stenosis.   There is hyperdynamic left ventricular systolic function.  The left ventricular ejection fraction is greater than 65% by visual estimate.   LV end diastolic pressure is normal.   There is no aortic valve stenosis.   POST-CATH DIAGNOSES Severe single-vessel disease with tandem 60 and 95% focal  stenoses in the mid anomalous codominant LCx prior to OM1-AV groove bifurcation treated successfully with a Synergy XD DES 2.25 mm x 16 mm deployed to 2.45 mm. TIMI-3 flow pre and post. Otherwise large caliber codominant RCA with large PDA and large RV marginal branch with minimal disease, and very large caliber LAD with equally large major diagonal branch also with minimal disease in the LAD. Hyperdynamic LV with EF > 65%, no RWMA. LVEDP 7 mmHg.  Patient has chronic chest discomfort which was not similar to the symptoms noted when she had pretty classic anginal pain in the jaw and throat/neck with PTCA and stent placement.  Chronic pain is a 2/10, anginal pain is a 0/10.     RECOMMENDATIONS In the absence of any other complications or medical issues, we expect the patient to be ready for discharge from an interventional cardiology perspective on 02/04/2023. Recommend uninterrupted dual antiplatelet therapy with Aspirin 81mg  daily and Ticagrelor 90mg  twice daily for a minimum of 12 months (ACS-Class I recommendation). Given the difficulty of stenting this vessel, would recommend at least another year of antiplatelet agent monotherapy with either maintenance dose Brilinta 60 mg twice daily or Plavix 75 mg daily that can be interrupted.    Patient Profile     62 y.o. female with a history of HTN, CVA, asthma, anxiety and PVD who was seen 02/03/23 for the evaluation of NSTEMI  Assessment & Plan    NSTEMI -- presented with chest pain radiating to neck and jaw. Troponin peak 290.  -- EKG with no ST changes -- Cath yesterday showed 60% mid-dist Cx, 95% dist Cx.  -- successful PCI to both Cx lesions -- continue aspirin 81mg  daily, brilinta 90mg  BID -- HR limits beta blocker --  reports insomnia with higher doses of crestor. Will switch to atorvastatin 40mg  daily   HTN  -- elevated this AM  -- restart home amlodipine 5mg  daily   Ok for discharge from a cardiac standpoint    For questions or  updates, please contact [facility] Please consult [email or web address] for contact info under     Signed, Osborne Oman, RN Student Nurse Practitioner 02/04/2023, 10:24 AM

## 2023-02-05 ENCOUNTER — Telehealth: Payer: Self-pay

## 2023-02-05 NOTE — Transitions of Care (Post Inpatient/ED Visit) (Signed)
02/05/2023  Name: Sydney [name] MRN: [id number] DOB: [birth year]  Today's TOC FU Call Status: Today's TOC FU Call Status:: Successful TOC FU Call Competed TOC FU Call Complete Date: 02/05/23  Transition Care Management Follow-up Telephone Call Date of Discharge: 02/04/23 Discharge Facility: [facility]) Type of Discharge: Inpatient Admission Primary Inpatient Discharge Diagnosis:: STEMI How have you been since you were released from the hospital?: Better Any questions or concerns?: No  Items Reviewed: Did you receive and understand the discharge instructions provided?: Yes Medications obtained,verified, and reconciled?: Yes (Medications Reviewed) Any new allergies since your discharge?: No Dietary orders reviewed?: Yes Do you have support at home?: Yes People in Home: sibling(s)  Medications Reviewed Today: Medications Reviewed Today     Reviewed by Karena Addison, LPN (Licensed Practical Nurse) on 02/05/23 at 1125  Med List Status: <None>   Medication Order Taking? Sig Documenting Provider Last Dose Status Informant  albuterol (VENTOLIN HFA) 108 (90 Base) MCG/ACT inhaler [id number] No Inhale 2 puffs into the lungs every 6 (six) hours as needed for wheezing or shortness of breath. [provider] Taking Active Self  amLODipine (NORVASC) 5 MG tablet [id number] No Take 1 tablet (5 mg total) by mouth daily. Delynn Flavin M, DO 02/01/2023 Active Self  aspirin EC 81 MG tablet [id number]  Take 1 tablet (81 mg total) by mouth daily. Swallow whole. Sherryll Burger, Pratik D, DO  Active   atorvastatin (LIPITOR) 40 MG tablet [id number]  Take 1 tablet (40 mg total) by mouth daily. Sherryll Burger, Pratik D, DO  Active   cetirizine (ZYRTEC) 10 MG tablet [id number] No Take 10 mg by mouth daily. [provider] 02/01/2023 Active Self  cyclobenzaprine (FLEXERIL) 10 MG tablet [id number] No TAKE 1/2 TO 1 TABLET BY MOUTH 3 TIMES A DAY AS NEEDED FOR MUSCLE SPASMS Delynn Flavin M, DO Taking Active  Self  EPINEPHrine (EPIPEN 2-PAK) 0.3 mg/0.3 mL IJ SOAJ injection [id number] No Inject 0.3 mg into the muscle as needed for anaphylaxis. Delynn Flavin M, DO Taking Active Self  HYDROcodone-acetaminophen (NORCO/VICODIN) 5-325 MG tablet [id number] No Take 1 tablet by mouth every 4 (four) hours as needed for severe pain (PUT ON FILE). Delynn Flavin M, DO Past Month Active Self  metoprolol succinate (TOPROL-XL) 25 MG 24 hr tablet [id number] No Take 1 tablet (25 mg total) by mouth daily. Delynn Flavin M, DO 02/01/2023 Active Self  sertraline (ZOLOFT) 100 MG tablet [id number] No Take 1.5 tablets (150 mg total) by mouth daily. Delynn Flavin M, DO 02/01/2023 Active Self  solifenacin (VESICARE) 5 MG tablet [id number] No Take 1 tablet (5 mg total) by mouth daily. Malen Gauze, MD 02/01/2023 Active Self  ticagrelor (BRILINTA) 90 MG TABS tablet [id number]  Take 1 tablet (90 mg total) by mouth 2 (two) times daily. Maurilio Lovely D, DO  Active   traZODone (DESYREL) 100 MG tablet [id number] No TAKE 1 TO 2 TABLETS AT     BEDTIME AS NEEDED FOR SLEEP Delynn Flavin M, DO 02/01/2023 Active Self            Home Care and Equipment/Supplies: Were Home Health Services Ordered?: NA Any new equipment or medical supplies ordered?: NA  Functional Questionnaire: Do you need assistance with bathing/showering or dressing?: No Do you need assistance with meal preparation?: No Do you need assistance with eating?: No Do you have difficulty maintaining continence: No Do you need assistance with getting out of bed/getting out of a chair/moving?: No Do you have difficulty managing or taking your medications?: No  Follow up appointments reviewed: PCP Follow-up appointment confirmed?: No (no avail appt sent message to staff to schedule) MD Provider Line Number:[phone] Given: No Specialist Hospital Follow-up appointment confirmed?: No Reason Specialist Follow-Up Not Confirmed: Patient has Specialist  Provider Number and will Call for Appointment Do you need transportation to your follow-up appointment?: No Do you understand care options if your condition(s) worsen?: Yes-patient verbalized understanding    SIGNATURE Karena Addison, LPN [facility] Nurse Health Advisor Direct Dial [phone]

## 2023-02-07 LAB — LIPOPROTEIN A (LPA): Lipoprotein (a): 22.4 nmol/L (ref <75.0)

## 2023-02-09 NOTE — Progress Notes (Signed)
Cardiology Office Note:  .   Date:  02/23/2023  ID:  Sydney [name], DOB [birth year], MRN [id number] PCP: Raliegh Ip, DO   [facility] Cardiologist:  Marjo Bicker, MD    History of Present Illness: .   Sydney [name] is a 62 y.o. female with history of HTN and tobacco abuse presented to ED 02/02/23 with NSTEMI treated with DES mid-distal Cfx.  Patient comes in for f/u. Denies any further chest tightness, dyspnea, edema, palpitations. Walks with a cane because of back problems. Trying to move around more, eat better, has lost 7 lbs, smoking 7 cigarettes daily. Plans to drive to [place] next week.  ROS:    Studies Reviewed: Marland Kitchen         Prior CV Studies:    CULPRIT LESION SEGMENT: Mid Cx to Dist Cx lesion is 60% stenosed.  Dist Cx lesion is 95% stenosed.   A drug-eluting stent was successfully placed covering both lesions, using a SYNERGY XD 2.25X16 -> deployed to 2.45 mm with stent balloon.  Post intervention, there is a 0% residual stenosis for both lesions.TIMI-3 flow pre and post   -----------------------------------------------------------------   Post intervention, there is a 0% residual stenosis.   There is hyperdynamic left ventricular systolic function.  The left ventricular ejection fraction is greater than 65% by visual estimate.   LV end diastolic pressure is normal.   There is no aortic valve stenosis.   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 TIMI-3 flow pre and post. Otherwise large caliber codominant RCA with large PDA and large RV marginal branch with minimal disease, and very large caliber LAD with equally large major diagonal branch also with minimal disease in the LAD. Hyperdynamic LV with EF > 65%, no RWMA. LVEDP 7 mmHg.  Patient has  chronic chest discomfort which was not similar to the symptoms noted when she had pretty classic anginal pain in the jaw and throat/neck with PTCA and stent placement.  Chronic pain is a 2/10, anginal pain is a 0/10.     RECOMMENDATIONS In the absence of any other complications or medical issues, we expect the patient to be ready for discharge from an interventional cardiology perspective on 02/04/2023. Recommend uninterrupted dual antiplatelet therapy with Aspirin 81mg  daily and Ticagrelor 90mg  twice daily for a minimum of 12 months (ACS-Class I recommendation). Given the difficulty of stenting this vessel, would recommend at least another year of antiplatelet agent monotherapy with either maintenance dose Brilinta 60 mg twice daily or Plavix 75 mg daily that can be interrupted.       Bryan Lemma, MD  Risk Assessment/Calculations:             Physical Exam:   VS:  BP 110/74   Pulse 64   Ht 5' 5.5" (1.664 m)   Wt 224 lb 12.8 oz (102 kg)   SpO2 96%   BMI 36.84 kg/m    Wt Readings from Last 3 Encounters:  02/23/23 224 lb 12.8 oz (102 kg)  02/20/23 223 lb (101.2 kg)  02/03/23 230 lb (104.3 kg)    GMW:NUUVO, well developed in no acute distress NECK: No JVD; No carotid bruits CARDIAC:  RRR, no murmurs, rubs, gallops RESPIRATORY:  Clear to auscultation without rales, wheezing or rhonchi  ABDOMEN: Soft, non-tender, non-distended EXTREMITIES:  No  edema; No deformity   ASSESSMENT AND PLAN: .    NSTEMI S/P DES mid-distal Cfx, normal LVEF 65%, minimal CAD elsewhere, DAPT 12 months, followed by another year of monotherapy with brilinta 50 mg bid or plavix 75 mg daily. No angina. Tolerating meds well.   HTN controlled  HLD -statin changed to lipitor. Check FLP in 2 months.  Tobacco abuse-has cut back to 7 cigarettes daily. Smoking cessation discussed.         Dispo: f//u in 2-3 months with Dr. Jenene Slicker  Signed, Jacolyn Reedy, PA-C

## 2023-02-19 NOTE — Patient Instructions (Signed)
Our records indicate that you are due for your screening mammogram.  Please call the imaging center that does your yearly mammograms to make an appointment for a mammogram at your earliest convenience. Our office also has a mobile unit through the [facility] that comes to our location. Please call our office if you would like to make an appointment.   

## 2023-02-20 ENCOUNTER — Ambulatory Visit: Payer: 59 | Admitting: Family Medicine

## 2023-02-20 VITALS — BP 127/78 | HR 70 | Ht 66.0 in | Wt 223.0 lb

## 2023-02-20 DIAGNOSIS — Z87892 Personal history of anaphylaxis: Secondary | ICD-10-CM

## 2023-02-20 DIAGNOSIS — Z955 Presence of coronary angioplasty implant and graft: Secondary | ICD-10-CM | POA: Diagnosis not present

## 2023-02-20 DIAGNOSIS — Z09 Encounter for follow-up examination after completed treatment for conditions other than malignant neoplasm: Secondary | ICD-10-CM | POA: Diagnosis not present

## 2023-02-20 DIAGNOSIS — I252 Old myocardial infarction: Secondary | ICD-10-CM | POA: Diagnosis not present

## 2023-02-20 MED ORDER — EPINEPHRINE 0.3 MG/0.3ML IJ SOAJ
0.3000 mg | INTRAMUSCULAR | 0 refills | Status: AC | PRN
Start: 2023-02-20 — End: ?

## 2023-02-20 MED ORDER — NITROGLYCERIN 0.4 MG SL SUBL
0.4000 mg | SUBLINGUAL_TABLET | SUBLINGUAL | 0 refills | Status: AC | PRN
Start: 2023-02-20 — End: ?

## 2023-02-20 NOTE — Progress Notes (Signed)
Subjective: CC:Hospital discharge follow up PCP: Raliegh Ip, DO WUJ:WJXB Sydney [name] is a 62 y.o. female presenting to clinic today for:  1. Recent NSTEMI S/p cardiac stent. Discharged on Lipitor 40mg , ASA, Brillinta.  She reports doing well. Mild soreness in her chest but no radiation to jaw and not severe.  No shortness of breath. Actively working on weight loss, smoking cessation (down to 6-7 cigs per day). Mildly anemic but denies blood loss.  Has appt with Cardiology on Monday.   ROS: Per HPI  Allergies  Allergen Reactions   Bee Pollen Anaphylaxis and Swelling   Bee Venom Anaphylaxis and Swelling   Butorphanol Other (See Comments)    Not sure told by md she was allergic after surgery.    Meloxicam Anxiety    Mood disorder Altered her personality    Penicillins Anaphylaxis and Hives    Unable to Recall As child mom was told she is highly allergic Did it involve swelling of the face/tongue/throat, SOB, or low BP? Unknown Did it involve sudden or severe rash/hives, skin peeling, or any reaction on the inside of your mouth or nose? Unknown Did you need to seek medical attention at a hospital or doctor's office? Unknown When did it last happen?      childhood allergy If all above answers are "NO", may proceed with cephalosporin use.    Prochlorperazine Edisylate Anaphylaxis   Sulfa Antibiotics Anaphylaxis    Unable to Recall   Tramadol Anxiety    Didn't like the way it made her feel    Rosuvastatin Other (See Comments)    Per pt, keeps her awake when she takes rosuvastatin daily so decreased to every other day    Clindamycin Hives   Levaquin [Levofloxacin] Itching   Topiramate Nausea And Vomiting   Doxycycline Dermatitis, Hives, Itching, Photosensitivity and Rash   Past Medical History:  Diagnosis Date   Allergy    Anemia    history of   Anxiety    Arthritis    Asthma    Atherosclerosis    Depression    Family history of adverse reaction to anesthesia     pt's mother felt everything and couldn't speak during a gallbladder surgery   GERD (gastroesophageal reflux disease)    History of hiatal hernia    History of kidney stones    Hypertension    Pneumonia    a few times   TIA (transient ischemic attack) 2012    Current Outpatient Medications:    albuterol (VENTOLIN HFA) 108 (90 Base) MCG/ACT inhaler, Inhale 2 puffs into the lungs every 6 (six) hours as needed for wheezing or shortness of breath., Disp: , Rfl:    amLODipine (NORVASC) 5 MG tablet, Take 1 tablet (5 mg total) by mouth daily., Disp: 90 tablet, Rfl: 3   aspirin EC 81 MG tablet, Take 1 tablet (81 mg total) by mouth daily. Swallow whole., Disp: 30 tablet, Rfl: 12   atorvastatin (LIPITOR) 40 MG tablet, Take 1 tablet (40 mg total) by mouth daily., Disp: 30 tablet, Rfl: 0   cetirizine (ZYRTEC) 10 MG tablet, Take 10 mg by mouth daily., Disp: , Rfl:    HYDROcodone-acetaminophen (NORCO/VICODIN) 5-325 MG tablet, Take 1 tablet by mouth every 4 (four) hours as needed for severe pain (PUT ON FILE)., Disp: 30 tablet, Rfl: 0   metoprolol succinate (TOPROL-XL) 25 MG 24 hr tablet, Take 1 tablet (25 mg total) by mouth daily., Disp: 90 tablet, Rfl: 3   nitroGLYCERIN (NITROSTAT) 0.4  MG SL tablet, Place 1 tablet (0.4 mg total) under the tongue every 5 (five) minutes as needed for chest pain. Then call 911 or go to ER, Disp: 50 tablet, Rfl: 0   sertraline (ZOLOFT) 100 MG tablet, Take 1.5 tablets (150 mg total) by mouth daily., Disp: 90 tablet, Rfl: 3   solifenacin (VESICARE) 5 MG tablet, Take 1 tablet (5 mg total) by mouth daily., Disp: 30 tablet, Rfl: 3   ticagrelor (BRILINTA) 90 MG TABS tablet, Take 1 tablet (90 mg total) by mouth 2 (two) times daily., Disp: 60 tablet, Rfl: 1   traZODone (DESYREL) 100 MG tablet, TAKE 1 TO 2 TABLETS AT     BEDTIME AS NEEDED FOR SLEEP, Disp: 180 tablet, Rfl: 3   EPINEPHrine (EPIPEN 2-PAK) 0.3 mg/0.3 mL IJ SOAJ injection, Inject 0.3 mg into the muscle as needed for  anaphylaxis., Disp: 2 each, Rfl: 0 Social History   Socioeconomic History   Marital status: Married    Spouse name: Not on file   Number of children: 3   Years of education: Not on file   Highest education level: 12th grade  Occupational History   Not on file  Tobacco Use   Smoking status: Every Day    Current packs/day: 0.50    Average packs/day: 0.5 packs/day for 23.0 years (11.5 ttl pk-yrs)    Types: Cigarettes   Smokeless tobacco: Never  Vaping Use   Vaping status: Never Used  Substance and Sexual Activity   Alcohol use: Yes    Comment: occ   Drug use: Never   Sexual activity: Not Currently  Other Topics Concern   Not on file  Social History Narrative   Recently relocated to [place] from the [place].  She resides at home with her husband.   She has 3 children but 1 passed away from cancer.   Social Determinants of Health   Financial Resource Strain: Low Risk  (10/30/2022)   Overall Financial Resource Strain (CARDIA)    Difficulty of Paying Living Expenses: Not hard at all  Food Insecurity: No Food Insecurity (02/02/2023)   Hunger Vital Sign    Worried About Running Out of Food in the Last Year: Never true    Ran Out of Food in the Last Year: Never true  Transportation Needs: No Transportation Needs (02/02/2023)   PRAPARE - Administrator, Civil Service (Medical): No    Lack of Transportation (Non-Medical): No  Physical Activity: Unknown (10/30/2022)   Exercise Vital Sign    Days of Exercise per Week: 2 days    Minutes of Exercise per Session: Patient declined  Stress: Stress Concern Present (10/30/2022)   [employer] of Occupational Health - Occupational Stress Questionnaire    Feeling of Stress : To some extent  Social Connections: Unknown (10/30/2022)   Social Connection and Isolation Panel [NHANES]    Frequency of Communication with Friends and Family: Twice a week    Frequency of Social Gatherings with Friends and Family: Once a week     Attends Religious Services: Patient declined    Database administrator or Organizations: No    Attends Engineer, structural: Not on file    Marital Status: Married  Catering manager Violence: Not At Risk (02/02/2023)   Humiliation, Afraid, Rape, and Kick questionnaire    Fear of Current or Ex-Partner: No    Emotionally Abused: No    Physically Abused: No    Sexually Abused: No   Family History  Problem Relation Age of Onset   Anxiety disorder Mother    Depression Mother    Heart disease Mother    Hypertension Mother    Arrhythmia Mother    Cancer Father        colon, age greater than 65   Lung cancer Father    Migraines Sister    Cancer Daughter        nerve sheath sarcoma   Alcohol abuse Brother    Breast cancer Neg Hx     Objective: Office vital signs reviewed. BP 127/78   Pulse 70   Ht 5\' 6"  (1.676 m)   Wt 223 lb (101.2 kg)   SpO2 97%   BMI 35.99 kg/m   Physical Examination:  General: Awake, alert, well nourished, No acute distress HEENT: sclera white, MMM Cardio: regular rate and rhythm, S1S2 heard, no murmurs appreciated Pulm: clear to auscultation bilaterally, no wheezes, rhonchi or rales; normal work of breathing on room air Extremities: Healing ecchymosis along the ventral aspect of the right forearm  Assessment/ Plan: 62 y.o. female   History of non-ST elevation myocardial infarction (NSTEMI) - Plan: Anemia Profile B, nitroGLYCERIN (NITROSTAT) 0.4 MG SL tablet  Hospital discharge follow-up  S/P coronary artery stent placement - Plan: Anemia Profile B, nitroGLYCERIN (NITROSTAT) 0.4 MG SL tablet  History of anaphylaxis - Plan: EPINEPHrine (EPIPEN 2-PAK) 0.3 mg/0.3 mL IJ SOAJ injection  Seems to be doing well status post stent placement.  Reinforced need for ongoing smoking cessation, increase physical exercise etc.  She seems to be actively working on that with visible weight loss so far.  Wonder if maybe she would benefit from sleep studies.   Discussed that this may be something that is offered to her.  I am going to recheck her CBC given slight anemia noted at discharge though suspect it may be due to blood loss in the setting of cardiac cath etc.  I have also ordered nitroglycerin for her to have on hand for emergency purposes.  Keep follow-up with cardiology as scheduled Monday  EpiPen renewed as her current one expires this month.  Would like to see her back in a couple of months for routine checkup, sooner if needed  Orders Placed This Encounter  Procedures   Anemia Profile B   Meds ordered this encounter  Medications   EPINEPHrine (EPIPEN 2-PAK) 0.3 mg/0.3 mL IJ SOAJ injection    Sig: Inject 0.3 mg into the muscle as needed for anaphylaxis.    Dispense:  2 each    Refill:  0   nitroGLYCERIN (NITROSTAT) 0.4 MG SL tablet    Sig: Place 1 tablet (0.4 mg total) under the tongue every 5 (five) minutes as needed for chest pain. Then call 911 or go to ER    Dispense:  50 tablet    Refill:  0      Hulen Skains, DO [facility] [phone]

## 2023-02-23 ENCOUNTER — Encounter: Payer: Self-pay | Admitting: Physician Assistant

## 2023-02-23 ENCOUNTER — Ambulatory Visit: Payer: 59 | Attending: Physician Assistant | Admitting: Physician Assistant

## 2023-02-23 VITALS — BP 110/74 | HR 64 | Ht 65.5 in | Wt 224.8 lb

## 2023-02-23 DIAGNOSIS — I2510 Atherosclerotic heart disease of native coronary artery without angina pectoris: Secondary | ICD-10-CM | POA: Diagnosis not present

## 2023-02-23 DIAGNOSIS — I872 Venous insufficiency (chronic) (peripheral): Secondary | ICD-10-CM

## 2023-02-23 DIAGNOSIS — Z720 Tobacco use: Secondary | ICD-10-CM

## 2023-02-23 DIAGNOSIS — I10 Essential (primary) hypertension: Secondary | ICD-10-CM | POA: Diagnosis not present

## 2023-02-23 DIAGNOSIS — E785 Hyperlipidemia, unspecified: Secondary | ICD-10-CM

## 2023-02-23 MED ORDER — ATORVASTATIN CALCIUM 40 MG PO TABS: 40.0000 mg | ORAL_TABLET | Freq: Every day | ORAL | 3 refills | Status: DC

## 2023-02-23 MED ORDER — TICAGRELOR 90 MG PO TABS: 90.0000 mg | ORAL_TABLET | Freq: Two times a day (BID) | ORAL | 3 refills | Status: DC

## 2023-02-23 NOTE — Patient Instructions (Addendum)
Medication Instructions:  Your physician recommends that you continue on your current medications as directed. Please refer to the Current Medication list given to you today.  *If you need a refill on your cardiac medications before your next appointment, please call your pharmacy*   Lab Work: Your physician recommends that you return for lab work in: 2-3 Months just before your next visit. ( Lipid)   If you have labs (blood work) drawn today and your tests are completely normal, you will receive your results only by: MyChart Message (if you have MyChart) OR A paper copy in the mail If you have any lab test that is abnormal or we need to change your treatment, we will call you to review the results.   Testing/Procedures: NONE    Follow-Up: At [facility], you and your health needs are our priority.  As part of our continuing mission to provide you with exceptional heart care, we have created designated Provider Care Teams.  These Care Teams include your primary Cardiologist (physician) and Advanced Practice Providers (APPs -  Physician Assistants and Nurse Practitioners) who all work together to provide you with the care you need, when you need it.Managing the Challenge of Quitting Smoking Quitting smoking is a physical and mental challenge. You may have cravings, withdrawal symptoms, and temptation to smoke. Before quitting, work with your health care provider to make a plan that can help you manage quitting. Making a plan before you quit may keep you from smoking when you have the urge to smoke while trying to quit. How to manage lifestyle changes Managing stress Stress can make you want to smoke, and wanting to smoke may cause stress. It is important to find ways to manage your stress. You could try some of the following: Practice relaxation techniques. Breathe slowly and deeply, in through your nose and out through your mouth. Listen to music. Soak in a bath or take a  shower. Imagine a peaceful place or vacation. Get some support. Talk with family or friends about your stress. Join a support group. Talk with a counselor or therapist. Get some physical activity. Go for a walk, run, or bike ride. Play a favorite sport. Practice yoga.  Medicines Talk with your health care provider about medicines that might help you deal with cravings and make quitting easier for you. Relationships Social situations can be difficult when you are quitting smoking. To manage this, you can: Avoid parties and other social situations where people might be smoking. Avoid alcohol. Leave right away if you have the urge to smoke. Explain to your family and friends that you are quitting smoking. Ask for support and let them know you might be a bit grumpy. Plan activities where smoking is not an option. General instructions Be aware that many people gain weight after they quit smoking. However, not everyone does. To keep from gaining weight, have a plan in place before you quit, and stick to the plan after you quit. Your plan should include: Eating healthy snacks. When you have a craving, it may help to: Eat popcorn, or try carrots, celery, or other cut vegetables. Chew sugar-free gum. Changing how you eat. Eat small portion sizes at meals. Eat 4-6 small meals throughout the day instead of 1-2 large meals a day. Be mindful when you eat. You should avoid watching television or doing other things that might distract you as you eat. Exercising regularly. Make time to exercise each day. If you do not have time for  a long workout, do short bouts of exercise for 5-10 minutes several times a day. Do some form of strengthening exercise, such as weight lifting. Do some exercise that gets your heart beating and causes you to breathe deeply, such as walking fast, running, swimming, or biking. This is very important. Drinking plenty of water or other low-calorie or no-calorie drinks. Drink  enough fluid to keep your urine pale yellow.  How to recognize withdrawal symptoms Your body and mind may experience discomfort as you try to get used to not having nicotine in your system. These effects are called withdrawal symptoms. They may include: Feeling hungrier than normal. Having trouble concentrating. Feeling irritable or restless. Having trouble sleeping. Feeling depressed. Craving a cigarette. These symptoms may surprise you, but they are normal to have when quitting smoking. To manage withdrawal symptoms: Avoid places, people, and activities that trigger your cravings. Remember why you want to quit. Get plenty of sleep. Avoid coffee and other drinks that contain caffeine. These may worsen some of your symptoms. How to manage cravings Come up with a plan for how to deal with your cravings. The plan should include the following: A definition of the specific situation you want to deal with. An activity or action you will take to replace smoking. A clear idea for how this action will help. The name of someone who could help you with this. Cravings usually last for 5-10 minutes. Consider taking the following actions to help you with your plan to deal with cravings: Keep your mouth busy. Chew sugar-free gum. Suck on hard candies or a straw. Brush your teeth. Keep your hands and body busy. Change to a different activity right away. Squeeze or play with a ball. Do an activity or a hobby, such as making bead jewelry, practicing needlepoint, or working with wood. Mix up your normal routine. Take a short exercise break. Go for a quick walk, or run up and down stairs. Focus on doing something kind or helpful for someone else. Call a friend or family member to talk during a craving. Join a support group. Contact a quitline. Where to find support To get help or find a support group: Call the National Cancer Institute's Smoking Quitline: 1-800-QUIT-NOW 757-546-2681) Text QUIT to  SmokefreeTXT: 401027 Where to find more information Visit these websites to find more information on quitting smoking: U.S. Department of Health and Human Services: www.smokefree.gov American Lung Association: www.freedomfromsmoking.org Centers for Disease Control and Prevention (CDC): FootballExhibition.com.br American Heart Association: www.heart.org Contact a health care provider if: You want to change your plan for quitting. The medicines you are taking are not helping. Your eating feels out of control or you cannot sleep. You feel depressed or become very anxious. Summary Quitting smoking is a physical and mental challenge. You will face cravings, withdrawal symptoms, and temptation to smoke again. Preparation can help you as you go through these challenges. Try different techniques to manage stress, handle social situations, and prevent weight gain. You can deal with cravings by keeping your mouth busy (such as by chewing gum), keeping your hands and body busy, calling family or friends, or contacting a quitline for people who want to quit smoking. You can deal with withdrawal symptoms by avoiding places where people smoke, getting plenty of rest, and avoiding drinks that contain caffeine. This information is not intended to replace advice given to you by your health care provider. Make sure you discuss any questions you have with your health care provider. Document Revised: 06/28/2021  Document Reviewed: 06/28/2021 Elsevier Patient Education  2024 Elsevier Inc.   We recommend signing up for the patient portal called "MyChart".  Sign up information is provided on this After Visit Summary.  MyChart is used to connect with patients for Virtual Visits (Telemedicine).  Patients are able to view lab/test results, encounter notes, upcoming appointments, etc.  Non-urgent messages can be sent to your provider as well.   To learn more about what you can do with MyChart, go to [email or web address].    Your  next appointment:   2 -3 month(s)  Provider:   You may see Vishnu P Mallipeddi, MD or one of the following Advanced Practice Providers on your designated Care Team:   Randall An, PA-C  Jacolyn Reedy, PA-C     Other Instructions Thank you for choosing [facility]!

## 2023-03-04 ENCOUNTER — Ambulatory Visit: Payer: 59 | Admitting: Family Medicine

## 2023-03-04 ENCOUNTER — Other Ambulatory Visit: Payer: Self-pay

## 2023-03-04 DIAGNOSIS — Z1231 Encounter for screening mammogram for malignant neoplasm of breast: Secondary | ICD-10-CM

## 2023-03-20 ENCOUNTER — Ambulatory Visit: Payer: 59 | Admitting: Urology

## 2023-03-20 ENCOUNTER — Encounter: Payer: Self-pay | Admitting: Urology

## 2023-03-20 VITALS — BP 101/62 | HR 75

## 2023-03-20 DIAGNOSIS — N3281 Overactive bladder: Secondary | ICD-10-CM | POA: Diagnosis not present

## 2023-03-20 DIAGNOSIS — N3941 Urge incontinence: Secondary | ICD-10-CM | POA: Diagnosis not present

## 2023-03-20 LAB — BLADDER SCAN AMB NON-IMAGING: Scan Result: 0

## 2023-03-20 MED ORDER — SOLIFENACIN SUCCINATE 5 MG PO TABS: 5.0000 mg | ORAL_TABLET | Freq: Every day | ORAL | 11 refills | Status: DC

## 2023-03-20 NOTE — Progress Notes (Signed)
post void residual=0 ?

## 2023-03-20 NOTE — Progress Notes (Signed)
03/20/2023 11:32 AM   [name] 1961/05/21 [id number]  Referring provider: Raliegh Ip, DO [address] [place],  [place] [address]  Followup OAB   HPI: [name] is a 62yo here for followuop for OAB. She was placed on vesicare 5mg  last visit and she notes improvement in her urinary frequency to every 2 hours. She has urinary incontinence which is improved with vesicare. She drinks 1 cup of coffee daily.    PMH: Past Medical History:  Diagnosis Date   Allergy    Anemia    history of   Anxiety    Arthritis    Asthma    Atherosclerosis    Depression    Family history of adverse reaction to anesthesia    pt's mother felt everything and couldn't speak during a gallbladder surgery   GERD (gastroesophageal reflux disease)    History of hiatal hernia    History of kidney stones    Hypertension    Pneumonia    a few times   TIA (transient ischemic attack) 2012    Surgical History: Past Surgical History:  Procedure Laterality Date   ABDOMINAL HYSTERECTOMY     abdominal   APPENDECTOMY     bladder tack     CARPAL TUNNEL RELEASE Bilateral    CERVICAL SPINE SURGERY     5-6, 6-7   CHOLECYSTECTOMY     COLONOSCOPY WITH PROPOFOL N/A 07/10/2020   Procedure: COLONOSCOPY WITH PROPOFOL;  Surgeon: Lanelle Bal, DO;  Location: [facility];  Service: Endoscopy;  Laterality: N/A;  12:15pm   CORONARY STENT INTERVENTION N/A 02/03/2023   Procedure: CORONARY STENT INTERVENTION;  Surgeon: Marykay Lex, MD;  Location: [facility] INVASIVE CV LAB;  Service: Cardiovascular;  Laterality: N/A;   ENDOVENOUS ABLATION SAPHENOUS VEIN W/ LASER Left 11/28/2021   endovenous laser ablation left greater saphenous vein by Coral Else MD   HAMMER TOE SURGERY Right    JOINT REPLACEMENT     LEFT HEART CATH AND CORONARY ANGIOGRAPHY N/A 02/03/2023   Procedure: LEFT HEART CATH AND CORONARY ANGIOGRAPHY;  Surgeon: Marykay Lex, MD;  Location: [facility] INVASIVE CV LAB;  Service: Cardiovascular;   Laterality: N/A;   TOTAL HIP 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   Procedure: LEFT TOTAL HIP ARTHROPLASTY -DIRECT ANTERIOR;  Surgeon: Eldred Manges, MD;  Location: [facility] OR;  Service: Orthopedics;  Laterality: Left;   TOTAL HIP ARTHROPLASTY Right 12/05/2019   Procedure: RIGHT TOTAL HIP ARTHROPLASTY ANTERIOR APPROACH  DIRECT ANTERIOR;  Surgeon: Eldred Manges, MD;  Location: [facility] OR;  Service: Orthopedics;  Laterality: Right;    Home Medications:  Allergies as of 03/20/2023       Reactions   Bee Pollen Anaphylaxis, Swelling   Bee Venom Anaphylaxis, Swelling   Butorphanol Other (See Comments)   Not sure told by md she was allergic after surgery.   Meloxicam Anxiety   Mood disorder Altered her personality   Penicillins Anaphylaxis, Hives   Unable to Recall As child mom was told she is highly allergic Did it involve swelling of the face/tongue/throat, SOB, or low BP? Unknown Did it involve sudden or severe rash/hives, skin peeling, or any reaction on the inside of your mouth or nose? Unknown Did you need to seek medical attention at a hospital or doctor's office? Unknown When did it last happen?      childhood allergy If all above answers are "NO", may proceed with cephalosporin use.   Prochlorperazine Edisylate Anaphylaxis   Sulfa Antibiotics Anaphylaxis  Unable to Recall   Tramadol Anxiety   Didn't like the way it made her feel   Rosuvastatin Other (See Comments)   Per pt, keeps her awake when she takes rosuvastatin daily so decreased to every other day    Clindamycin Hives   Levaquin [levofloxacin] Itching   Topiramate Nausea And Vomiting   Doxycycline Dermatitis, Hives, Itching, Photosensitivity, Rash        Medication List        Accurate as of March 20, 2023 11:32 AM. If you have any questions, ask your nurse or doctor.          albuterol 108 (90 Base) MCG/ACT inhaler Commonly known as: VENTOLIN HFA Inhale 2 puffs into the lungs every 6 (six) hours as needed for wheezing  or shortness of breath.   amLODipine 5 MG tablet Commonly known as: NORVASC Take 1 tablet (5 mg total) by mouth daily.   aspirin EC 81 MG tablet Take 1 tablet (81 mg total) by mouth daily. Swallow whole.   atorvastatin 40 MG tablet Commonly known as: LIPITOR Take 1 tablet (40 mg total) by mouth daily.   cetirizine 10 MG tablet Commonly known as: ZYRTEC Take 10 mg by mouth daily.   EPINEPHrine 0.3 mg/0.3 mL Soaj injection Commonly known as: EpiPen 2-Pak Inject 0.3 mg into the muscle as needed for anaphylaxis.   HYDROcodone-acetaminophen 5-325 MG tablet Commonly known as: NORCO/VICODIN Take 1 tablet by mouth every 4 (four) hours as needed for severe pain (PUT ON FILE).   metoprolol succinate 25 MG 24 hr tablet Commonly known as: TOPROL-XL Take 1 tablet (25 mg total) by mouth daily.   nitroGLYCERIN 0.4 MG SL tablet Commonly known as: NITROSTAT Place 1 tablet (0.4 mg total) under the tongue every 5 (five) minutes as needed for chest pain. Then call 911 or go to ER   sertraline 100 MG tablet Commonly known as: ZOLOFT Take 1.5 tablets (150 mg total) by mouth daily.   solifenacin 5 MG tablet Commonly known as: VESICARE Take 1 tablet (5 mg total) by mouth daily.   ticagrelor 90 MG Tabs tablet Commonly known as: BRILINTA Take 1 tablet (90 mg total) by mouth 2 (two) times daily.   traZODone 100 MG tablet Commonly known as: DESYREL TAKE 1 TO 2 TABLETS AT     BEDTIME AS NEEDED FOR SLEEP        Allergies:  Allergies  Allergen Reactions   Bee Pollen Anaphylaxis and Swelling   Bee Venom Anaphylaxis and Swelling   Butorphanol Other (See Comments)    Not sure told by md she was allergic after surgery.    Meloxicam Anxiety    Mood disorder Altered her personality    Penicillins Anaphylaxis and Hives    Unable to Recall As child mom was told she is highly allergic Did it involve swelling of the face/tongue/throat, SOB, or low BP? Unknown Did it involve sudden or  severe rash/hives, skin peeling, or any reaction on the inside of your mouth or nose? Unknown Did you need to seek medical attention at a hospital or doctor's office? Unknown When did it last happen?      childhood allergy If all above answers are "NO", may proceed with cephalosporin use.    Prochlorperazine Edisylate Anaphylaxis   Sulfa Antibiotics Anaphylaxis    Unable to Recall   Tramadol Anxiety    Didn't like the way it made her feel    Rosuvastatin Other (See Comments)    Per pt,  keeps her awake when she takes rosuvastatin daily so decreased to every other day    Clindamycin Hives   Levaquin [Levofloxacin] Itching   Topiramate Nausea And Vomiting   Doxycycline Dermatitis, Hives, Itching, Photosensitivity and Rash    Family History: Family History  Problem Relation Age of Onset   Anxiety disorder Mother    Depression Mother    Heart disease Mother    Hypertension Mother    Arrhythmia Mother    Cancer Father        colon, age greater than 55   Lung cancer Father    Migraines Sister    Cancer Daughter        nerve sheath sarcoma   Alcohol abuse Brother    Breast cancer Neg Hx     Social History:  reports that she has been smoking cigarettes. She has a 11.5 pack-year smoking history. She has never used smokeless tobacco. She reports current alcohol use. She reports that she does not use drugs.  ROS: All other review of systems were reviewed and are negative except what is noted above in HPI  Physical Exam: BP 101/62   Pulse 75   Constitutional:  Alert and oriented, No acute distress. HEENT: [place] AT, moist mucus membranes.  Trachea midline, no masses. Cardiovascular: No clubbing, cyanosis, or edema. Respiratory: Normal respiratory effort, no increased work of breathing. GI: Abdomen is soft, nontender, nondistended, no abdominal masses GU: No CVA tenderness.  Lymph: No cervical or inguinal lymphadenopathy. Skin: No rashes, bruises or suspicious lesions. Neurologic:  Grossly intact, no focal deficits, moving all 4 extremities. Psychiatric: Normal mood and affect.  Laboratory Data: Lab Results  Component Value Date   WBC 7.4 02/20/2023   HGB 14.7 02/20/2023   HCT 44.5 02/20/2023   MCV 90 02/20/2023   PLT 251 02/20/2023    Lab Results  Component Value Date   CREATININE 0.61 02/04/2023    No results found for: "PSA"  No results found for: "TESTOSTERONE"  Lab Results  Component Value Date   HGBA1C 5.3 11/03/2022    Urinalysis    Component Value Date/Time   COLORURINE STRAW (A) 12/02/2019 1121   APPEARANCEUR Clear 01/14/2023 1132   LABSPEC 1.005 12/02/2019 1121   PHURINE 6.0 12/02/2019 1121   GLUCOSEU Negative 01/14/2023 1132   HGBUR SMALL (A) 12/02/2019 1121   BILIRUBINUR Negative 01/14/2023 1132   KETONESUR NEGATIVE 12/02/2019 1121   PROTEINUR Negative 01/14/2023 1132   PROTEINUR NEGATIVE 12/02/2019 1121   NITRITE Negative 01/14/2023 1132   NITRITE NEGATIVE 12/02/2019 1121   LEUKOCYTESUR Negative 01/14/2023 1132   LEUKOCYTESUR NEGATIVE 12/02/2019 1121    Lab Results  Component Value Date   LABMICR Comment 01/14/2023   WBCUA 0-5 06/12/2021   LABEPIT None seen 06/12/2021   MUCUS Present 06/12/2021   BACTERIA Few 06/12/2021    Pertinent Imaging:  Results for orders placed in visit on 04/15/19  DG Abd 1 View  Narrative CLINICAL DATA:  Lower abdominal pain for the past few days.  EXAM: ABDOMEN - 1 VIEW  COMPARISON:  None.  FINDINGS: The bowel gas pattern is unremarkable. No findings for obstruction or perforation. The soft tissue shadows are grossly maintained. No worrisome calcifications. Possible upper pole and lower pole left renal calculi.  The bony structures are intact. Age advanced bilateral hip joint degenerative changes, right greater than left. There are also degenerative or posttraumatic changes involving the pubic symphysis. Scoliosis and advanced degenerative thoracolumbar  spondylosis.  IMPRESSION: No plain film findings  for an acute abdominal process.  Possible upper and lower pole left renal calculi.   Electronically Signed By: Rudie Meyer M.D. On: 04/15/2019 14:26  No results found for this or any previous visit.  No results found for this or any previous visit.  No results found for this or any previous visit.  No results found for this or any previous visit.  No valid procedures specified. No results found for this or any previous visit.  Results for orders placed during the hospital encounter of 04/19/19  CT Renal Stone Study  Narrative CLINICAL DATA:  62 year old female with increasing abdominal and flank pain today. Known urinary calculi.  EXAM: CT ABDOMEN AND PELVIS WITHOUT CONTRAST  TECHNIQUE: Multidetector CT imaging of the abdomen and pelvis was performed following the standard protocol without IV contrast.  COMPARISON:  None.  FINDINGS: Please note that parenchymal abnormalities may be missed without intravenous contrast.  Lower chest: No acute abnormality.  Hepatobiliary: The liver is unremarkable. Patient is status post cholecystectomy. No biliary dilatation.  Pancreas: Unremarkable  Spleen: Unremarkable  Adrenals/Urinary Tract: A 4 mm proximal LEFT ureteral calculus causes mild LEFT hydronephrosis. At least 3 non obstructing LEFT renal calculi are identified measuring 3-5 mm.  The RIGHT kidney, adrenal glands and bladder are unremarkable.  Stomach/Bowel: Stomach is within normal limits. No evidence of bowel wall thickening, distention, or inflammatory changes.  Vascular/Lymphatic: Aortic atherosclerosis. No enlarged abdominal or pelvic lymph nodes.  Reproductive: Status post hysterectomy. No adnexal masses.  Other: No ascites, pneumoperitoneum or focal collection.  Musculoskeletal: No acute or suspicious bony abnormalities. Multilevel degenerative disc disease, spondylosis and facet arthropathy  noted within the lumbar spine.  IMPRESSION: 1. 4 mm proximal LEFT ureteral calculus causing mild LEFT hydronephrosis. 2. LEFT nephrolithiasis 3.  Aortic Atherosclerosis (ICD10-I70.0).   Electronically Signed By: Harmon Pier M.D. On: 04/19/2019 20:58   Assessment & Plan:    1. Urge incontinence -We discussed continuing vesicare 5mg  daily versus increasing vesicare to 10mg  and we have elected to proceed with vesicare 5mg  daily  - BLADDER SCAN AMB NON-IMAGING   No follow-ups on file.  Wilkie Aye, MD  [facility] [place]

## 2023-03-20 NOTE — Patient Instructions (Signed)

## 2023-03-29 ENCOUNTER — Other Ambulatory Visit: Payer: Self-pay | Admitting: Family Medicine

## 2023-03-29 DIAGNOSIS — F32A Depression, unspecified: Secondary | ICD-10-CM

## 2023-04-21 ENCOUNTER — Ambulatory Visit: Payer: 59 | Admitting: Internal Medicine

## 2023-04-24 ENCOUNTER — Telehealth: Payer: Self-pay | Admitting: Internal Medicine

## 2023-04-24 MED ORDER — TICAGRELOR 90 MG PO TABS: 90.0000 mg | ORAL_TABLET | Freq: Two times a day (BID) | ORAL | 1 refills | Status: DC

## 2023-04-24 NOTE — Telephone Encounter (Signed)
Rx refill sent to pharmacy. 

## 2023-04-24 NOTE — Telephone Encounter (Signed)
*  STAT* If patient is at the pharmacy, call can be transferred to refill team.   1. Which medications need to be refilled? (please list name of each medication and dose if known) ticagrelor (BRILINTA) 90 MG TABS tablet    2. Would you like to learn more about the convenience, safety, & potential cost savings by using the [facility]?     3. Are you open to using the [facility] (Type [facility]. ).   4. Which pharmacy/location (including street and city if local pharmacy) is medication to be sent to? [id number] - [place], [place] - [address]    5. Do they need a 30 day or 90 day supply? 30

## 2023-04-29 ENCOUNTER — Encounter: Payer: Self-pay | Admitting: Nurse Practitioner

## 2023-04-29 ENCOUNTER — Ambulatory Visit: Payer: 59 | Admitting: Nurse Practitioner

## 2023-04-29 VITALS — BP 121/72 | HR 59 | Temp 97.8°F | Ht 65.5 in | Wt 219.0 lb

## 2023-04-29 DIAGNOSIS — J3489 Other specified disorders of nose and nasal sinuses: Secondary | ICD-10-CM | POA: Diagnosis not present

## 2023-04-29 DIAGNOSIS — R0981 Nasal congestion: Secondary | ICD-10-CM | POA: Diagnosis not present

## 2023-04-29 DIAGNOSIS — R52 Pain, unspecified: Secondary | ICD-10-CM | POA: Insufficient documentation

## 2023-04-29 LAB — VERITOR FLU A/B WAIVED
Influenza A: NEGATIVE
Influenza B: NEGATIVE

## 2023-04-29 MED ORDER — FLONASE SENSIMIST 27.5 MCG/SPRAY NA SUSP: 2.0000 | Freq: Every day | NASAL | 12 refills | Status: DC

## 2023-04-29 MED ORDER — ALBUTEROL SULFATE HFA 108 (90 BASE) MCG/ACT IN AERS: 2.0000 | INHALATION_SPRAY | Freq: Four times a day (QID) | RESPIRATORY_TRACT | 0 refills | Status: DC | PRN

## 2023-04-29 NOTE — Progress Notes (Signed)
Established Patient Office Visit  Subjective   Patient ID: Sydney [name], female    DOB: [birth year]  Age: 62 y.o. MRN: [id number]  Chief Complaint  Patient presents with   Cough   Nasal Congestion    And chest started for days ago low grade fever    Generalized Body Aches    HPI Sydney [name] is a 62 y.o. female who complains of congestion, sore throat, post nasal drip, and cough described as productive of yellow sputum for 3 days. She denies a history of anorexia, chest pain, chills, fatigue, nausea, and vomiting and has a history of asthma. Patient has smoke cigarettes. Has take Mucinex and it is helping . She has been around her sick son last week COVID, FLU administered , Flu negative     OBJECTIVE: She appears well, vital signs are as noted. Ears normal.  Throat and pharynx normal.  Neck supple. No adenopathy in the neck. Nose is congested. Sinuses non tender. The chest is clear, without wheezes or rales.  Patient Active Problem List   Diagnosis Date Noted   Nasal congestion with rhinorrhea 04/29/2023   Body aches 04/29/2023   NSTEMI (non-ST elevated myocardial infarction) (HCC) 02/02/2023   Nicotine abuse 02/02/2023   Weakness of left quadriceps muscle 11/06/2022   Facet degeneration of lumbar region 02/13/2022   Peripheral vascular disease with stasis dermatitis 09/03/2021   Urine frequency 05/10/2021   Cellulitis of left leg 01/18/2021   Rash 01/18/2021   History of bilateral hip arthroplasty 01/03/2021   Upper respiratory tract infection 12/30/2020   Non-recurrent acute serous otitis media of right ear 12/30/2020   Pharyngitis 10/29/2020   Trochanteric bursitis, left hip 09/13/2020   Closed fracture of multiple pubic rami, left, sequela 08/16/2020   H/O adenomatous polyp of colon 05/23/2020   FH: colon cancer in relative diagnosed at >30 years old 05/23/2020   Impingement syndrome of right shoulder 05/03/2020   H/O total hip arthroplasty 12/06/2019   Back pain  07/01/2018   DDD (degenerative disc disease), lumbosacral 07/01/2018   Venous insufficiency of both lower extremities 11/17/2016   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 level with radiculopathy 08/18/2016   Morbid obesity with BMI of 40.0-44.9, adult (HCC) 07/28/2016   Chronic pain syndrome 10/24/2015   Dysfunction of both eustachian tubes 07/26/2014   Calculus of left kidney 04/25/2014   Impaired fasting glucose 04/05/2014   History of bladder surgery 04/03/2014   History of hysterectomy 04/03/2014   Urge incontinence 04/03/2014   Asthma 07/28/2013   Nontoxic uninodular goiter 07/28/2013   Solitary pulmonary nodule 07/28/2013   Lumbar radiculopathy 04/20/2013   Insomnia 03/22/2013   Primary hypertension 08/18/2011   Moderate episode of recurrent major depressive disorder (HCC) 04/03/2011   Other B-complex deficiencies 08/16/2010   Family history of cerebrovascular accident (CVA) 04/17/2010   Family history of malignant neoplasm of gastrointestinal tract 04/17/2010   Family history of other cardiovascular diseases(V17.49) 04/17/2010   Past Medical History:  Diagnosis Date   Allergy    Anemia    history of   Anxiety    Arthritis    Asthma    Atherosclerosis    Depression    Family history of adverse reaction to anesthesia    pt's mother felt everything and couldn't speak during a gallbladder surgery   GERD (gastroesophageal reflux disease)    History of hiatal hernia    History of kidney stones    Hypertension    Pneumonia    a few times  TIA (transient ischemic attack) 2012   Past Surgical History:  Procedure Laterality Date   ABDOMINAL HYSTERECTOMY     abdominal   APPENDECTOMY     bladder tack     CARPAL TUNNEL RELEASE Bilateral    CERVICAL SPINE SURGERY     5-6, 6-7   CHOLECYSTECTOMY     COLONOSCOPY WITH PROPOFOL N/A 07/10/2020   Procedure: COLONOSCOPY WITH PROPOFOL;  Surgeon: Lanelle Bal, DO;  [facility];  Service: Endoscopy;  Laterality: N/A;   12:15pm   CORONARY STENT INTERVENTION N/A 02/03/2023   Procedure: CORONARY STENT INTERVENTION;  Surgeon: Marykay Lex, MD;  Location: [facility] INVASIVE CV LAB;  Service: Cardiovascular;  Laterality: N/A;   ENDOVENOUS ABLATION SAPHENOUS VEIN W/ LASER Left 11/28/2021   endovenous laser ablation left greater saphenous vein by Coral Else MD   HAMMER TOE SURGERY Right    JOINT REPLACEMENT     LEFT HEART CATH AND CORONARY ANGIOGRAPHY N/A 02/03/2023   Procedure: LEFT HEART CATH AND CORONARY ANGIOGRAPHY;  Surgeon: Marykay Lex, MD;  Location: [facility] INVASIVE CV LAB;  Service: Cardiovascular;  Laterality: N/A;   TOTAL HIP 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   Procedure: LEFT TOTAL HIP ARTHROPLASTY -DIRECT ANTERIOR;  Surgeon: Eldred Manges, MD;  Location: [facility] OR;  Service: Orthopedics;  Laterality: Left;   TOTAL HIP ARTHROPLASTY Right 12/05/2019   Procedure: RIGHT TOTAL HIP ARTHROPLASTY ANTERIOR APPROACH  DIRECT ANTERIOR;  Surgeon: Eldred Manges, MD;  Location: [facility] OR;  Service: Orthopedics;  Laterality: Right;   Social History   Tobacco Use   Smoking status: Every Day    Current packs/day: 0.50    Average packs/day: 0.5 packs/day for 23.0 years (11.5 ttl pk-yrs)    Types: Cigarettes   Smokeless tobacco: Never  Vaping Use   Vaping status: Never Used  Substance Use Topics   Alcohol use: Yes    Comment: occ   Drug use: Never   Social History   Socioeconomic History   Marital status: Married    Spouse name: Not on file   Number of children: 3   Years of education: Not on file   Highest education level: 12th grade  Occupational History   Not on file  Tobacco Use   Smoking status: Every Day    Current packs/day: 0.50    Average packs/day: 0.5 packs/day for 23.0 years (11.5 ttl pk-yrs)    Types: Cigarettes   Smokeless tobacco: Never  Vaping Use   Vaping status: Never Used  Substance and Sexual Activity   Alcohol use: Yes    Comment: occ   Drug use: Never   Sexual activity: Not Currently   Other Topics Concern   Not on file  Social History Narrative   Recently relocated to [place] from [place].  She resides at home with her husband.   She has 3 children but 1 passed away from cancer.   Social Determinants of Health   Financial Resource Strain: Low Risk  (10/30/2022)   Overall Financial Resource Strain (CARDIA)    Difficulty of Paying Living Expenses: Not hard at all  Food Insecurity: No Food Insecurity (02/02/2023)   Hunger Vital Sign    Worried About Running Out of Food in the Last Year: Never true    Ran Out of Food in the Last Year: Never true  Transportation Needs: No Transportation Needs (02/02/2023)   PRAPARE - Administrator, Civil Service (Medical): No    Lack of Transportation (Non-Medical): No  Physical Activity: Unknown (10/30/2022)   Exercise Vital Sign    Days of Exercise per Week: 2 days    Minutes of Exercise per Session: Patient declined  Stress: Stress Concern Present (10/30/2022)   [employer] of Occupational Health - Occupational Stress Questionnaire    Feeling of Stress : To some extent  Social Connections: Unknown (10/30/2022)   Social Connection and Isolation Panel [NHANES]    Frequency of Communication with Friends and Family: Twice a week    Frequency of Social Gatherings with Friends and Family: Once a week    Attends Religious Services: Patient declined    Database administrator or Organizations: No    Attends Engineer, structural: Not on file    Marital Status: Married  Catering manager Violence: Not At Risk (02/02/2023)   Humiliation, Afraid, Rape, and Kick questionnaire    Fear of Current or Ex-Partner: No    Emotionally Abused: No    Physically Abused: No    Sexually Abused: No   Family Status  Relation Name Status   Mother  Alive   Father  Deceased       lung   Sister  Alive   Daughter  Deceased   Daughter  Alive       lives in [place]   Brother  Deceased   Brother  Alive   Son  Alive       resides  in [place]   Neg Hx  (Not Specified)  No partnership data on file   Family History  Problem Relation Age of Onset   Anxiety disorder Mother    Depression Mother    Heart disease Mother    Hypertension Mother    Arrhythmia Mother    Cancer Father        colon, age greater than 70   Lung cancer Father    Migraines Sister    Cancer Daughter        nerve sheath sarcoma   Alcohol abuse Brother    Breast cancer Neg Hx    Allergies  Allergen Reactions   Bee Pollen Anaphylaxis and Swelling   Bee Venom Anaphylaxis and Swelling   Butorphanol Other (See Comments)    Not sure told by md she was allergic after surgery.    Meloxicam Anxiety    Mood disorder Altered her personality    Penicillins Anaphylaxis and Hives    Unable to Recall As child mom was told she is highly allergic Did it involve swelling of the face/tongue/throat, SOB, or low BP? Unknown Did it involve sudden or severe rash/hives, skin peeling, or any reaction on the inside of your mouth or nose? Unknown Did you need to seek medical attention at a hospital or doctor's office? Unknown When did it last happen?      childhood allergy If all above answers are "NO", may proceed with cephalosporin use.    Prochlorperazine Edisylate Anaphylaxis   Sulfa Antibiotics Anaphylaxis    Unable to Recall   Tramadol Anxiety    Didn't like the way it made her feel    Rosuvastatin Other (See Comments)    Per pt, keeps her awake when she takes rosuvastatin daily so decreased to every other day    Clindamycin Hives   Levaquin [Levofloxacin] Itching   Topiramate Nausea And Vomiting   Doxycycline Dermatitis, Hives, Itching, Photosensitivity and Rash      Review of Systems  Constitutional:  Negative for chills and fever.  HENT:  Positive for  congestion.   Respiratory:  Positive for cough. Negative for shortness of breath and wheezing.   Cardiovascular:  Negative for chest pain and leg swelling.  Gastrointestinal:  Negative for  blood in stool, melena, nausea and vomiting.  Genitourinary:  Negative for dysuria and hematuria.  Musculoskeletal:  Negative for falls, joint pain and myalgias.  Skin:  Negative for itching and rash.  Neurological:  Negative for dizziness and headaches.  Endo/Heme/Allergies:  Negative for environmental allergies and polydipsia. Does not bruise/bleed easily.   Negative unless indicated in HPI   Objective:     BP 121/72   Pulse (!) 59   Temp 97.8 F (36.6 C) (Temporal)   Ht 5' 5.5" (1.664 m)   Wt 219 lb (99.3 kg)   SpO2 96%   BMI 35.89 kg/m  BP Readings from Last 3 Encounters:  04/29/23 121/72  03/20/23 101/62  02/23/23 110/74   Wt Readings from Last 3 Encounters:  04/29/23 219 lb (99.3 kg)  02/23/23 224 lb 12.8 oz (102 kg)  02/20/23 223 lb (101.2 kg)      Physical Exam Vitals and nursing note reviewed.  Constitutional:      General: She is not in acute distress.    Appearance: Normal appearance.  HENT:     Head: Normocephalic and atraumatic.     Nose: Congestion and rhinorrhea present.  Eyes:     Extraocular Movements: Extraocular movements intact.     Conjunctiva/sclera: Conjunctivae normal.     Pupils: Pupils are equal, round, and reactive to light.  Cardiovascular:     Rate and Rhythm: Normal rate and regular rhythm.  Pulmonary:     Effort: Pulmonary effort is normal.     Breath sounds: No wheezing.     Comments: Faint expiratory wheezing at the lower lobes Musculoskeletal:        General: No swelling or tenderness. Normal range of motion.  Skin:    General: Skin is warm and dry.     Findings: No rash.  Neurological:     General: No focal deficit present.     Mental Status: She is alert and oriented to person, place, and time.  Psychiatric:        Mood and Affect: Mood normal.        Behavior: Behavior normal.        Thought Content: Thought content normal.        Judgment: Judgment normal.    No results found for any visits on 04/29/23.  Last  CBC Lab Results  Component Value Date   WBC 7.4 02/20/2023   HGB 14.7 02/20/2023   HCT 44.5 02/20/2023   MCV 90 02/20/2023   MCH 29.7 02/20/2023   RDW 13.1 02/20/2023   PLT 251 02/20/2023   Last metabolic panel Lab Results  Component Value Date   GLUCOSE 108 (H) 02/04/2023   NA 137 02/04/2023   K 4.0 02/04/2023   CL 109 02/04/2023   CO2 23 02/04/2023   BUN 7 (L) 02/04/2023   CREATININE 0.61 02/04/2023   GFRNONAA >60 02/04/2023   CALCIUM 8.2 (L) 02/04/2023   PROT 6.5 11/03/2022   ALBUMIN 4.3 11/03/2022   LABGLOB 2.2 11/03/2022   AGRATIO 2.0 11/03/2022   BILITOT 0.7 11/03/2022   ALKPHOS 104 11/03/2022   AST 12 11/03/2022   ALT 13 11/03/2022   ANIONGAP 5 02/04/2023   Last lipids Lab Results  Component Value Date   CHOL 114 02/03/2023   HDL 34 (L) 02/03/2023  LDLCALC 64 02/03/2023   TRIG 80 02/03/2023   CHOLHDL 3.4 02/03/2023   Last hemoglobin A1c Lab Results  Component Value Date   HGBA1C 5.3 11/03/2022        Assessment & Plan:  Body aches -     Novel Coronavirus, NAA ([facility]) -     Veritor Flu A/B Waived  Nasal congestion with rhinorrhea  Other orders -     Albuterol Sulfate HFA; Inhale 2 puffs into the lungs every 6 (six) hours as needed for wheezing or shortness of breath.  Dispense: 18 g; Refill: 0 -     Flonase Sensimist; Place 2 sprays into the nose daily.  Dispense: 10 g; Refill: 12    Sohana is a 62 yrs old caucasian female, no acute distress Flu negative, awaiting for COVID results URI: continue Mucinex for cough; Tylenol for fever; Flonase for congestion  Ventolin refilled Increase hydration and rest  Encourage healthy lifestyle choices, including diet (rich in fruits, vegetables, and lean proteins, and low in salt and simple carbohydrates) and exercise (at least 30 minutes of moderate physical activity daily).     The above assessment and management plan was discussed with the patient. The patient verbalized understanding of and has  agreed to the management plan. Patient is aware to call the clinic if they develop any new symptoms or if symptoms persist or worsen. Patient is aware when to return to the clinic for a follow-up visit. Patient educated on when it is appropriate to go to the emergency department.  Return if symptoms worsen or fail to improve.    Arrie Aran Santa Lighter, DNP [facility] [address] [place], [place] [address] [phone]

## 2023-04-30 LAB — NOVEL CORONAVIRUS, NAA: SARS-CoV-2, NAA: NOT DETECTED

## 2023-05-07 ENCOUNTER — Ambulatory Visit: Payer: 59 | Admitting: Internal Medicine

## 2023-05-19 ENCOUNTER — Ambulatory Visit: Payer: 59 | Admitting: Family Medicine

## 2023-05-28 ENCOUNTER — Ambulatory Visit: Payer: 59 | Admitting: Orthopaedic Surgery

## 2023-05-28 ENCOUNTER — Other Ambulatory Visit: Payer: Self-pay | Admitting: Family Medicine

## 2023-05-28 DIAGNOSIS — F32A Depression, unspecified: Secondary | ICD-10-CM

## 2023-06-04 ENCOUNTER — Encounter: Payer: Self-pay | Admitting: Internal Medicine

## 2023-06-04 ENCOUNTER — Ambulatory Visit: Payer: Medicare Other | Attending: Internal Medicine | Admitting: Internal Medicine

## 2023-06-04 ENCOUNTER — Telehealth: Payer: Self-pay

## 2023-06-04 VITALS — BP 118/70 | HR 65 | Ht 65.0 in | Wt 217.0 lb

## 2023-06-04 DIAGNOSIS — Z7902 Long term (current) use of antithrombotics/antiplatelets: Secondary | ICD-10-CM

## 2023-06-04 DIAGNOSIS — I25119 Atherosclerotic heart disease of native coronary artery with unspecified angina pectoris: Secondary | ICD-10-CM

## 2023-06-04 DIAGNOSIS — Z9861 Coronary angioplasty status: Secondary | ICD-10-CM | POA: Diagnosis not present

## 2023-06-04 DIAGNOSIS — E785 Hyperlipidemia, unspecified: Secondary | ICD-10-CM | POA: Insufficient documentation

## 2023-06-04 DIAGNOSIS — E7849 Other hyperlipidemia: Secondary | ICD-10-CM

## 2023-06-04 DIAGNOSIS — I2510 Atherosclerotic heart disease of native coronary artery without angina pectoris: Secondary | ICD-10-CM | POA: Insufficient documentation

## 2023-06-04 MED ORDER — RANOLAZINE ER 500 MG PO TB12: 500.0000 mg | ORAL_TABLET | Freq: Two times a day (BID) | ORAL | 3 refills | Status: DC

## 2023-06-04 MED ORDER — ATORVASTATIN CALCIUM 40 MG PO TABS: 40.0000 mg | ORAL_TABLET | Freq: Every day | ORAL | 3 refills | Status: DC

## 2023-06-04 MED ORDER — TICAGRELOR 90 MG PO TABS: 90.0000 mg | ORAL_TABLET | Freq: Two times a day (BID) | ORAL | 1 refills | Status: DC

## 2023-06-04 NOTE — Telephone Encounter (Signed)
Dr. Jenene Slicker added after patient had left the office: Obtain 2D echocardiogram, *Diagnosis is CAD. Let patient know as well as sending in addition refills of atorvastatin and brilinta. Advised her someone will reach out and schedule Echo with her. Patient verbalized understanding.
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
## 2023-06-04 NOTE — Patient Instructions (Signed)
Medication Instructions:  Your physician has recommended you make the following change in your medication:  Start taking Ranolazine 500 mg twice daily Continue taking all other medications as prescribed   Labwork: None  Testing/Procedures: None  Follow-Up: Your physician recommends that you schedule a follow-up appointment in: 6 months  Any Other Special Instructions Will Be Listed Below (If Applicable). Thank you for choosing [facility]!     If you need a refill on your cardiac medications before your next appointment, please call your pharmacy.

## 2023-06-05 ENCOUNTER — Ambulatory Visit: Payer: 59 | Admitting: Urology

## 2023-06-16 NOTE — Progress Notes (Signed)
Name: Sydney [name] DOB: 24-May-1961 MRN: [id number]  History of Present Illness: Ms. Sydney [name] is a 62 y.o. female who presents today for follow up visit at [facility] [place]. - GU history: 1. OAB with urinary frequency, urgency, and urge incontinence. - Drinks 1 cup of coffee daily.  2. Kidney stones.  At last visit with Dr. Ronne Binning on 03/20/2023: - Symptoms improved with starting Vesicare 5 mg at prior visit.   - Normal PVR (0 ml). - Discussed continuing Vesicare 5 mg daily versus increasing Vesicare to 10 mg daily. - She elected to continue with Vesicare 5 mg daily.  Today: She reports that she lost her health insurance for a few months so she has been off the Vesicare for a couple months at this point. She reports that she just got her health care coverage back and plans to restart that medication - states it was helping in the past.   Currently she reports nocturia a few times per night. She reports daytime urgency and frequency which she states is manageable. Reports morning caffeine intake (coffee); tries to avoid caffeine in the afternoon / evening. She denies dysuria, gross hematuria, straining to void, or sensations of incomplete emptying.   She reports LLQ abdominal pain over the past month which has been intermittent and moderate-to-severe when present. She questions if she may be passing a stone.   She denies history of recent or recurrent UTI.  Fall Screening: Do you usually have a device to assist in your mobility? Yes - cane   Medications: Current Outpatient Medications  Medication Sig Dispense Refill   albuterol (VENTOLIN HFA) 108 (90 Base) MCG/ACT inhaler Inhale 2 puffs into the lungs every 6 (six) hours as needed for wheezing or shortness of breath. 18 g 0   amLODipine (NORVASC) 5 MG tablet Take 1 tablet (5 mg total) by mouth daily. 90 tablet 3   aspirin EC 81 MG tablet Take 1 tablet (81 mg total) by mouth daily. Swallow whole. 30 tablet 12    atorvastatin (LIPITOR) 40 MG tablet Take 1 tablet (40 mg total) by mouth daily. 90 tablet 3   cetirizine (ZYRTEC) 10 MG tablet Take 10 mg by mouth daily.     EPINEPHrine (EPIPEN 2-PAK) 0.3 mg/0.3 mL IJ SOAJ injection Inject 0.3 mg into the muscle as needed for anaphylaxis. 2 each 0   fluticasone (FLONASE SENSIMIST) 27.5 MCG/SPRAY nasal spray Place 2 sprays into the nose daily. 10 g 12   metoprolol succinate (TOPROL-XL) 25 MG 24 hr tablet Take 1 tablet (25 mg total) by mouth daily. 90 tablet 3   nitroGLYCERIN (NITROSTAT) 0.4 MG SL tablet Place 1 tablet (0.4 mg total) under the tongue every 5 (five) minutes as needed for chest pain. Then call 911 or go to ER 50 tablet 0   ranolazine (RANEXA) 500 MG 12 hr tablet Take 1 tablet (500 mg total) by mouth 2 (two) times daily. 60 tablet 3   sertraline (ZOLOFT) 100 MG tablet TAKE 1 AND 1/2 TABLETS(150 MG TOTAL) DAILY 90 tablet 0   silodosin (RAPAFLO) 8 MG CAPS capsule Take 1 capsule (8 mg total) by mouth daily as needed. For stone symptoms. 30 capsule 0   solifenacin (VESICARE) 5 MG tablet Take 1 tablet (5 mg total) by mouth daily. 30 tablet 11   ticagrelor (BRILINTA) 90 MG TABS tablet Take 1 tablet (90 mg total) by mouth 2 (two) times daily. 180 tablet 1   traZODone (DESYREL) 100 MG tablet TAKE 1 TO  2 TABLETS AT     BEDTIME AS NEEDED FOR SLEEP 180 tablet 3   No current facility-administered medications for this visit.    Allergies: Allergies  Allergen Reactions   Bee Pollen Anaphylaxis and Swelling   Bee Venom Anaphylaxis and Swelling   Butorphanol Other (See Comments)    Not sure told by md she was allergic after surgery.    Meloxicam Anxiety    Mood disorder Altered her personality    Penicillins Anaphylaxis and Hives    Unable to Recall As child mom was told she is highly allergic Did it involve swelling of the face/tongue/throat, SOB, or low BP? Unknown Did it involve sudden or severe rash/hives, skin peeling, or any reaction on the inside  of your mouth or nose? Unknown Did you need to seek medical attention at a hospital or doctor's office? Unknown When did it last happen?      childhood allergy If all above answers are "NO", may proceed with cephalosporin use.    Prochlorperazine Edisylate Anaphylaxis   Sulfa Antibiotics Anaphylaxis    Unable to Recall   Tramadol Anxiety    Didn't like the way it made her feel    Rosuvastatin Other (See Comments)    Per pt, keeps her awake when she takes rosuvastatin daily so decreased to every other day    Clindamycin Hives   Levaquin [Levofloxacin] Itching   Topiramate Nausea And Vomiting   Doxycycline Dermatitis, Hives, Itching, Photosensitivity and Rash    Past Medical History:  Diagnosis Date   Allergy    Anemia    history of   Anxiety    Arthritis    Asthma    Atherosclerosis    Depression    Family history of adverse reaction to anesthesia    pt's mother felt everything and couldn't speak during a gallbladder surgery   GERD (gastroesophageal reflux disease)    History of hiatal hernia    History of kidney stones    Hypertension    Pneumonia    a few times   TIA (transient ischemic attack) 2012   Past Surgical History:  Procedure Laterality Date   ABDOMINAL HYSTERECTOMY     abdominal   APPENDECTOMY     bladder tack     CARPAL TUNNEL RELEASE Bilateral    CERVICAL SPINE SURGERY     5-6, 6-7   CHOLECYSTECTOMY     COLONOSCOPY WITH PROPOFOL N/A 07/10/2020   Procedure: COLONOSCOPY WITH PROPOFOL;  Surgeon: Lanelle Bal, DO;  Location: [facility];  Service: Endoscopy;  Laterality: N/A;  12:15pm   CORONARY STENT INTERVENTION N/A 02/03/2023   Procedure: CORONARY STENT INTERVENTION;  Surgeon: Marykay Lex, MD;  Location: [facility] INVASIVE CV LAB;  Service: Cardiovascular;  Laterality: N/A;   ENDOVENOUS ABLATION SAPHENOUS VEIN W/ LASER Left 11/28/2021   endovenous laser ablation left greater saphenous vein by Coral Else MD   HAMMER TOE SURGERY Right    JOINT  REPLACEMENT     LEFT HEART CATH AND CORONARY ANGIOGRAPHY N/A 02/03/2023   Procedure: LEFT HEART CATH AND CORONARY ANGIOGRAPHY;  Surgeon: Marykay Lex, MD;  Location: [facility] INVASIVE CV LAB;  Service: Cardiovascular;  Laterality: N/A;   TOTAL HIP 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   Procedure: LEFT TOTAL HIP ARTHROPLASTY -DIRECT ANTERIOR;  Surgeon: Eldred Manges, MD;  Location: [facility] OR;  Service: Orthopedics;  Laterality: Left;   TOTAL HIP ARTHROPLASTY Right 12/05/2019   Procedure: RIGHT TOTAL HIP ARTHROPLASTY ANTERIOR APPROACH  DIRECT ANTERIOR;  Surgeon: Eldred Manges, MD;  Location: [facility] OR;  Service: Orthopedics;  Laterality: Right;   Family History  Problem Relation Age of Onset   Anxiety disorder Mother    Depression Mother    Heart disease Mother    Hypertension Mother    Arrhythmia Mother    Cancer Father        colon, age greater than 77   Lung cancer Father    Migraines Sister    Cancer Daughter        nerve sheath sarcoma   Alcohol abuse Brother    Breast cancer Neg Hx    Social History   Socioeconomic History   Marital status: Married    Spouse name: Not on file   Number of children: 3   Years of education: Not on file   Highest education level: 12th grade  Occupational History   Not on file  Tobacco Use   Smoking status: Every Day    Current packs/day: 0.50    Average packs/day: 0.5 packs/day for 23.0 years (11.5 ttl pk-yrs)    Types: Cigarettes   Smokeless tobacco: Never  Vaping Use   Vaping status: Never Used  Substance and Sexual Activity   Alcohol use: Yes    Comment: occ   Drug use: Never   Sexual activity: Not Currently  Other Topics Concern   Not on file  Social History Narrative   Recently relocated to [place] from the [place].  She resides at home with her husband.   She has 3 children but 1 passed away from cancer.   Social Determinants of Health   Financial Resource Strain: Low Risk  (10/30/2022)   Overall Financial Resource Strain (CARDIA)     Difficulty of Paying Living Expenses: Not hard at all  Food Insecurity: No Food Insecurity (02/02/2023)   Hunger Vital Sign    Worried About Running Out of Food in the Last Year: Never true    Ran Out of Food in the Last Year: Never true  Transportation Needs: No Transportation Needs (02/02/2023)   PRAPARE - Administrator, Civil Service (Medical): No    Lack of Transportation (Non-Medical): No  Physical Activity: Unknown (10/30/2022)   Exercise Vital Sign    Days of Exercise per Week: 2 days    Minutes of Exercise per Session: Patient declined  Stress: Stress Concern Present (10/30/2022)   [employer] of Occupational Health - Occupational Stress Questionnaire    Feeling of Stress : To some extent  Social Connections: Unknown (10/30/2022)   Social Connection and Isolation Panel [NHANES]    Frequency of Communication with Friends and Family: Twice a week    Frequency of Social Gatherings with Friends and Family: Once a week    Attends Religious Services: Patient declined    Database administrator or Organizations: No    Attends Engineer, structural: Not on file    Marital Status: Married  Catering manager Violence: Not At Risk (02/02/2023)   Humiliation, Afraid, Rape, and Kick questionnaire    Fear of Current or Ex-Partner: No    Emotionally Abused: No    Physically Abused: No    Sexually Abused: No    Review of Systems Constitutional: Patient denies any unintentional weight loss or change in strength lntegumentary: Patient denies any rashes or pruritus Cardiovascular: Patient denies chest pain or syncope Respiratory: Patient denies shortness of breath Gastrointestinal: Patient denies nausea, vomiting, constipation, or diarrhea Musculoskeletal: Patient denies muscle cramps or  weakness Neurologic: Patient denies convulsions or seizures Allergic/Immunologic: Patient denies recent allergic reaction(s) Hematologic/Lymphatic: Patient denies bleeding  tendencies Endocrine: Patient denies heat/cold intolerance  GU: As per HPI.  OBJECTIVE Vitals:   06/22/23 1104  BP: 114/72  Pulse: 66  Temp: 98.5 F (36.9 C)   There is no height or weight on file to calculate BMI.  Physical Examination Constitutional: No obvious distress; patient is non-toxic appearing  Cardiovascular: No visible lower extremity edema.  Respiratory: The patient does not have audible wheezing/stridor; respirations do not appear labored  Gastrointestinal: Abdomen non-distended Musculoskeletal: Normal ROM of UEs  Skin: No obvious rashes/open sores  Neurologic: CN 2-12 grossly intact Psychiatric: Answered questions appropriately with normal affect  Hematologic/Lymphatic/Immunologic: No obvious bruises or sites of spontaneous bleeding  UA: negative PVR: 0 ml  ASSESSMENT OAB (overactive bladder) - Plan: BLADDER SCAN AMB NON-IMAGING, Urinalysis, Routine w reflex microscopic, CANCELED: Urinalysis, Routine w reflex microscopic  Urge incontinence - Plan: BLADDER SCAN AMB NON-IMAGING, CANCELED: Urinalysis, Routine w reflex microscopic  Urine frequency - Plan: BLADDER SCAN AMB NON-IMAGING, CANCELED: Urinalysis, Routine w reflex microscopic  LLQ pain - Plan: US RENAL, DG Abd 1 View, silodosin (RAPAFLO) 8 MG CAPS capsule  Kidney stones - Plan: US RENAL, DG Abd 1 View, silodosin (RAPAFLO) 8 MG CAPS capsule  1. OAB with urinary frequency, urgency, and urge incontinence. - Agree with patient's plan to restart Vesicare 5 mg daily. - Discussed behavioral modifications including decreased caffeine intake.  2. Kidney stones. We agreed to proceed with: - RUS and KUB to evaluate current stone burden.  - Rapaflo 8 mg daily for medical expulsive therapy (MET), which may improve passage of stone(s).  - For pain management, we discussed the use of opioids versus OTC analgesics. She reported that she has hydrocodone for back pain and can that if needed; otherwise takes Tylenol  (cannot take NSAIDs due to recent cardiac stent in July 2024).  Will plan to notify patient of recommendations following review of imaging results.  She was advised to contact urology provider or go to the ER if She develops fever >101F, uncontrollable pain, or other significantly concerning symptoms prior to next office visit.  Will plan for follow up in 6 weeks for recheck or sooner if needed. Pt verbalized understanding and agreement. All questions were answered.  PLAN Advised the following: 1. KUB & RUS.  2. Rapaflo (Silodosin) 8 mg daily PRN for stone symptoms. 3. Restart Vesicare 5 mg daily. 4. Minimize caffeine intake. 5. Return in about 6 weeks (around 08/03/2023) for UA, PVR, & f/u with Evette Georges NP.  Orders Placed This Encounter  Procedures   US RENAL    Standing Status:   Future    Standing Expiration Date:   06/21/2024    Order Specific Question:   Reason for Exam (SYMPTOM  OR DIAGNOSIS REQUIRED)    Answer:   kidney stone known or suspected    Order Specific Question:   Preferred imaging location?    Answer:   [facility]   DG Abd 1 View    Standing Status:   Future    Standing Expiration Date:   06/21/2024    Order Specific Question:   Reason for Exam (SYMPTOM  OR DIAGNOSIS REQUIRED)    Answer:   kidney stone    Order Specific Question:   Preferred imaging location?    Answer:   [facility]   Urinalysis, Routine w reflex microscopic   BLADDER SCAN AMB NON-IMAGING  It has been explained that the patient is to follow regularly with their PCP in addition to all other providers involved in their care and to follow instructions provided by these respective offices. Patient advised to contact urology clinic if any urologic-pertaining questions, concerns, new symptoms or problems arise in the interim period.  There are no Patient Instructions on file for this visit.  Electronically signed by:  Donnita Falls, FNP   06/22/23    12:20 PM

## 2023-06-22 ENCOUNTER — Encounter: Payer: Self-pay | Admitting: Urology

## 2023-06-22 ENCOUNTER — Ambulatory Visit (INDEPENDENT_AMBULATORY_CARE_PROVIDER_SITE_OTHER): Payer: Medicare HMO | Admitting: Urology

## 2023-06-22 ENCOUNTER — Ambulatory Visit: Payer: Medicare HMO | Attending: Internal Medicine

## 2023-06-22 VITALS — BP 114/72 | HR 66 | Temp 98.5°F

## 2023-06-22 DIAGNOSIS — I25119 Atherosclerotic heart disease of native coronary artery with unspecified angina pectoris: Secondary | ICD-10-CM

## 2023-06-22 DIAGNOSIS — N3941 Urge incontinence: Secondary | ICD-10-CM

## 2023-06-22 DIAGNOSIS — R1032 Left lower quadrant pain: Secondary | ICD-10-CM | POA: Diagnosis not present

## 2023-06-22 DIAGNOSIS — N3281 Overactive bladder: Secondary | ICD-10-CM | POA: Diagnosis not present

## 2023-06-22 DIAGNOSIS — N200 Calculus of kidney: Secondary | ICD-10-CM

## 2023-06-22 DIAGNOSIS — R350 Frequency of micturition: Secondary | ICD-10-CM | POA: Diagnosis not present

## 2023-06-22 LAB — URINALYSIS, ROUTINE W REFLEX MICROSCOPIC
Bilirubin, UA: NEGATIVE
Glucose, UA: NEGATIVE
Ketones, UA: NEGATIVE
Leukocytes,UA: NEGATIVE
Nitrite, UA: NEGATIVE
Protein,UA: NEGATIVE
RBC, UA: NEGATIVE
Specific Gravity, UA: 1.02 (ref 1.005–1.030)
Urobilinogen, Ur: 0.2 mg/dL (ref 0.2–1.0)
pH, UA: 6 (ref 5.0–7.5)

## 2023-06-22 LAB — BLADDER SCAN AMB NON-IMAGING: Scan Result: 1

## 2023-06-22 MED ORDER — SILODOSIN 8 MG PO CAPS: 8.0000 mg | ORAL_CAPSULE | Freq: Every day | ORAL | 0 refills | Status: DC | PRN

## 2023-06-23 LAB — ECHOCARDIOGRAM COMPLETE
AR max vel: 1.8 cm2
AV Area VTI: 2.37 cm2
AV Area mean vel: 2.05 cm2
AV Mean grad: 8 mm[Hg]
AV Peak grad: 15.1 mm[Hg]
Ao pk vel: 1.94 m/s
Area-P 1/2: 2.7 cm2
Calc EF: 66.7 %
MV VTI: 3.13 cm2
S' Lateral: 2.9 cm
Single Plane A2C EF: 61.3 %
Single Plane A4C EF: 66.9 %

## 2023-06-29 ENCOUNTER — Ambulatory Visit (HOSPITAL_COMMUNITY)
Admission: RE | Admit: 2023-06-29 | Discharge: 2023-06-29 | Disposition: A | Discharge status: Home / Self Care | Payer: Medicare HMO | Source: Ambulatory Visit | Attending: Urology | Admitting: Urology

## 2023-06-29 DIAGNOSIS — N200 Calculus of kidney: Secondary | ICD-10-CM

## 2023-06-29 DIAGNOSIS — R1032 Left lower quadrant pain: Secondary | ICD-10-CM | POA: Insufficient documentation

## 2023-06-29 DIAGNOSIS — N281 Cyst of kidney, acquired: Secondary | ICD-10-CM | POA: Diagnosis not present

## 2023-06-30 ENCOUNTER — Telehealth: Payer: Self-pay

## 2023-06-30 NOTE — Telephone Encounter (Signed)
-----   Message from Donnita Falls sent at 06/30/2023  8:34 AM EST ----- Please let pt know RUS was normal - no stones, masses, or hydronephrosis. Thanks.

## 2023-06-30 NOTE — Telephone Encounter (Signed)
Patient was made aware and voiced understanding.

## 2023-07-12 ENCOUNTER — Other Ambulatory Visit: Payer: Self-pay | Admitting: Urology

## 2023-07-12 DIAGNOSIS — N200 Calculus of kidney: Secondary | ICD-10-CM

## 2023-07-12 DIAGNOSIS — R1032 Left lower quadrant pain: Secondary | ICD-10-CM

## 2023-07-20 ENCOUNTER — Telehealth: Payer: Medicare HMO

## 2023-07-20 DIAGNOSIS — B9689 Other specified bacterial agents as the cause of diseases classified elsewhere: Secondary | ICD-10-CM

## 2023-07-20 DIAGNOSIS — J208 Acute bronchitis due to other specified organisms: Secondary | ICD-10-CM

## 2023-07-20 MED ORDER — AZITHROMYCIN 250 MG PO TABS: ORAL_TABLET | ORAL | 0 refills | Status: AC

## 2023-07-20 MED ORDER — PREDNISONE 20 MG PO TABS: 40.0000 mg | ORAL_TABLET | Freq: Every day | ORAL | 0 refills | Status: AC

## 2023-07-20 NOTE — Progress Notes (Signed)
Virtual Visit Consent   Sydney [name], you are scheduled for a virtual visit with a  provider today. Just as with appointments in the office, your consent must be obtained to participate. Your consent will be active for this visit and any virtual visit you may have with one of our providers in the next 365 days. If you have a MyChart account, a copy of this consent can be sent to you electronically.  As this is a virtual visit, video technology does not allow for your provider to perform a traditional examination. This may limit your provider's ability to fully assess your condition. If your provider identifies any concerns that need to be evaluated in person or the need to arrange testing (such as labs, EKG, etc.), we will make arrangements to do so. Although advances in technology are sophisticated, we cannot ensure that it will always work on either your end or our end. If the connection with a video visit is poor, the visit may have to be switched to a telephone visit. With either a video or telephone visit, we are not always able to ensure that we have a secure connection.  By engaging in this virtual visit, you consent to the provision of healthcare and authorize for your insurance to be billed (if applicable) for the services provided during this visit. Depending on your insurance coverage, you may receive a charge related to this service.  I need to obtain your verbal consent now. Are you willing to proceed with your visit today? Sydney [name] has provided verbal consent on 07/20/2023 for a virtual visit (video or telephone). Freddy Finner, NP  Date: 07/20/2023 8:22 AM  Virtual Visit via Video Note   I, Freddy Finner, connected with  Sydney [name]  ([id number], January 28, 1961) on 07/20/23 at  8:30 AM EST by a video-enabled telemedicine application and verified that I am speaking with the correct person using two identifiers.  Location: Patient: Virtual Visit Location Patient:  Home Provider: Virtual Visit Location Provider: Home Office   I discussed the limitations of evaluation and management by telemedicine and the availability of in person appointments. The patient expressed understanding and agreed to proceed.    History of Present Illness: Sydney [name] is a 62 y.o. who identifies as a female who was assigned female at birth, and is being seen today for cough and cold.  Onset was 22nd of Dec- over a week ago- with cold symptoms Associated symptoms are increasing cough with mucus that is yellow green, headache- and sinus pressure, neck glands and ear pressure, sore throat mild, fatigue, feels like it is in her head and upper chest Modifying factors are inhaler for asthma  Denies chest pain, shortness of breath, fevers, chills  History of bronchitis and PNA  Exposure to sick contacts- unknown COVID test: no Vaccines: not completed yet   Problems:  Patient Active Problem List   Diagnosis Date Noted   CAD (coronary artery disease) 06/04/2023   HLD (hyperlipidemia) 06/04/2023   Long term (current) use of antithrombotics/antiplatelets 06/04/2023   S/P coronary angioplasty 06/04/2023   Nasal congestion with rhinorrhea 04/29/2023   Body aches 04/29/2023   NSTEMI (non-ST elevated myocardial infarction) (HCC) 02/02/2023   Nicotine abuse 02/02/2023   Weakness of left quadriceps muscle 11/06/2022   Facet degeneration of lumbar region 02/13/2022   Peripheral vascular disease with stasis dermatitis 09/03/2021   Urine frequency 05/10/2021   History of bilateral hip arthroplasty 01/03/2021   Trochanteric bursitis, left hip 09/13/2020  Closed fracture of multiple pubic rami, left, sequela 08/16/2020   H/O adenomatous polyp of colon 05/23/2020   FH: colon cancer in relative diagnosed at >71 years old 05/23/2020   Impingement syndrome of right shoulder 05/03/2020   H/O total hip arthroplasty 12/06/2019   DDD (degenerative disc disease), lumbosacral 07/01/2018    Venous insufficiency of both lower extremities 11/17/2016   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 level with radiculopathy 08/18/2016   Chronic pain syndrome 10/24/2015   Dysfunction of both eustachian tubes 07/26/2014   Impaired fasting glucose 04/05/2014   History of bladder surgery 04/03/2014   History of hysterectomy 04/03/2014   Urge incontinence 04/03/2014   Asthma 07/28/2013   Nontoxic uninodular goiter 07/28/2013   Solitary pulmonary nodule 07/28/2013   Lumbar radiculopathy 04/20/2013   Insomnia 03/22/2013   Essential hypertension 08/18/2011   Moderate episode of recurrent major depressive disorder (HCC) 04/03/2011   Other B-complex deficiencies 08/16/2010   Morbid obesity due to excess calories (HCC) 05/17/2010   Family history of cerebrovascular accident (CVA) 04/17/2010   Family history of malignant neoplasm of gastrointestinal tract 04/17/2010   Family history of other cardiovascular diseases(V17.49) 04/17/2010    Allergies:  Allergies  Allergen Reactions   Bee Pollen Anaphylaxis and Swelling   Bee Venom Anaphylaxis and Swelling   Butorphanol Other (See Comments)    Not sure told by md she was allergic after surgery.    Meloxicam Anxiety    Mood disorder Altered her personality    Penicillins Anaphylaxis and Hives    Unable to Recall As child mom was told she is highly allergic Did it involve swelling of the face/tongue/throat, SOB, or low BP? Unknown Did it involve sudden or severe rash/hives, skin peeling, or any reaction on the inside of your mouth or nose? Unknown Did you need to seek medical attention at a hospital or doctor's office? Unknown When did it last happen?      childhood allergy If all above answers are "NO", may proceed with cephalosporin use.    Prochlorperazine Edisylate Anaphylaxis   Sulfa Antibiotics Anaphylaxis    Unable to Recall   Tramadol Anxiety    Didn't like the way it made her feel    Rosuvastatin Other (See Comments)    Per pt,  keeps her awake when she takes rosuvastatin daily so decreased to every other day    Clindamycin Hives   Levaquin [Levofloxacin] Itching   Topiramate Nausea And Vomiting   Doxycycline Dermatitis, Hives, Itching, Photosensitivity and Rash   Medications:  Current Outpatient Medications:    albuterol (VENTOLIN HFA) 108 (90 Base) MCG/ACT inhaler, Inhale 2 puffs into the lungs every 6 (six) hours as needed for wheezing or shortness of breath., Disp: 18 g, Rfl: 0   amLODipine (NORVASC) 5 MG tablet, Take 1 tablet (5 mg total) by mouth daily., Disp: 90 tablet, Rfl: 3   aspirin EC 81 MG tablet, Take 1 tablet (81 mg total) by mouth daily. Swallow whole., Disp: 30 tablet, Rfl: 12   atorvastatin (LIPITOR) 40 MG tablet, Take 1 tablet (40 mg total) by mouth daily., Disp: 90 tablet, Rfl: 3   cetirizine (ZYRTEC) 10 MG tablet, Take 10 mg by mouth daily., Disp: , Rfl:    EPINEPHrine (EPIPEN 2-PAK) 0.3 mg/0.3 mL IJ SOAJ injection, Inject 0.3 mg into the muscle as needed for anaphylaxis., Disp: 2 each, Rfl: 0   fluticasone (FLONASE SENSIMIST) 27.5 MCG/SPRAY nasal spray, Place 2 sprays into the nose daily., Disp: 10 g, Rfl: 12  metoprolol succinate (TOPROL-XL) 25 MG 24 hr tablet, Take 1 tablet (25 mg total) by mouth daily., Disp: 90 tablet, Rfl: 3   nitroGLYCERIN (NITROSTAT) 0.4 MG SL tablet, Place 1 tablet (0.4 mg total) under the tongue every 5 (five) minutes as needed for chest pain. Then call 911 or go to ER, Disp: 50 tablet, Rfl: 0   ranolazine (RANEXA) 500 MG 12 hr tablet, Take 1 tablet (500 mg total) by mouth 2 (two) times daily., Disp: 60 tablet, Rfl: 3   sertraline (ZOLOFT) 100 MG tablet, TAKE 1 AND 1/2 TABLETS(150 MG TOTAL) DAILY, Disp: 90 tablet, Rfl: 0   silodosin (RAPAFLO) 8 MG CAPS capsule, TAKE 1 CAPSULE (8 MG TOTAL) BY MOUTH DAILY AS NEEDED. FOR STONE SYMPTOMS., Disp: 30 capsule, Rfl: 0   solifenacin (VESICARE) 5 MG tablet, Take 1 tablet (5 mg total) by mouth daily., Disp: 30 tablet, Rfl: 11    ticagrelor (BRILINTA) 90 MG TABS tablet, Take 1 tablet (90 mg total) by mouth 2 (two) times daily., Disp: 180 tablet, Rfl: 1   traZODone (DESYREL) 100 MG tablet, TAKE 1 TO 2 TABLETS AT     BEDTIME AS NEEDED FOR SLEEP, Disp: 180 tablet, Rfl: 3  Observations/Objective: Patient is well-developed, well-nourished in no acute distress.  Resting comfortably  at home.  Head is normocephalic, atraumatic.  No labored breathing.  Speech is clear and coherent with logical content.  Patient is alert and oriented at baseline.    Assessment and Plan:  1. Acute bacterial bronchitis (Primary)  - azithromycin (ZITHROMAX) 250 MG tablet; Take 2 tablets on day 1, then 1 tablet daily on days 2 through 5  Dispense: 6 tablet; Refill: 0 - predniSONE (DELTASONE) 20 MG tablet; Take 2 tablets (40 mg total) by mouth daily with breakfast for 5 days.  Dispense: 10 tablet; Refill: 0  - Take meds as prescribed - Rest voice - Use a cool mist humidifier especially during the winter months when heat dries out the air. - Use saline nose sprays frequently to help soothe nasal passages if they are drying out. - Stay hydrated by drinking plenty of fluids - Keep thermostat turn down low to prevent drying out which can cause a dry cough. -Mucinex - For any cough or congestion- robitussin DM or Delsym as needed - For fever or aches or pains- take tylenol or ibuprofen as directed on bottle             * for fevers greater than 101 orally you may alternate ibuprofen and tylenol every 3 hours.  If you do not improve you will need a follow up visit in person.               Reviewed side effects, risks and benefits of medication.    Patient acknowledged agreement and understanding of the plan.   Past Medical, Surgical, Social History, Allergies, and Medications have been Reviewed.     Follow Up Instructions: I discussed the assessment and treatment plan with the patient. The patient was provided an opportunity to ask  questions and all were answered. The patient agreed with the plan and demonstrated an understanding of the instructions.  A copy of instructions were sent to the patient via MyChart unless otherwise noted below.    The patient was advised to call back or seek an in-person evaluation if the symptoms worsen or if the condition fails to improve as anticipated.    Freddy Finner, NP

## 2023-07-20 NOTE — Patient Instructions (Signed)
[name], thank you for joining Freddy Finner, NP for today's virtual visit.  While this provider is not your primary care provider (PCP), if your PCP is located in our provider database this encounter information will be shared with them immediately following your visit.   A [facility] MyChart account gives you access to today's visit and all your visits, tests, and labs performed at [facility] " click here if you don't have a [facility] MyChart account or go to [email or web address]  Consent: (Patient) Sydney [name] provided verbal consent for this virtual visit at the beginning of the encounter.  Current Medications:  Current Outpatient Medications:    azithromycin (ZITHROMAX) 250 MG tablet, Take 2 tablets on day 1, then 1 tablet daily on days 2 through 5, Disp: 6 tablet, Rfl: 0   predniSONE (DELTASONE) 20 MG tablet, Take 2 tablets (40 mg total) by mouth daily with breakfast for 5 days., Disp: 10 tablet, Rfl: 0   albuterol (VENTOLIN HFA) 108 (90 Base) MCG/ACT inhaler, Inhale 2 puffs into the lungs every 6 (six) hours as needed for wheezing or shortness of breath., Disp: 18 g, Rfl: 0   amLODipine (NORVASC) 5 MG tablet, Take 1 tablet (5 mg total) by mouth daily., Disp: 90 tablet, Rfl: 3   aspirin EC 81 MG tablet, Take 1 tablet (81 mg total) by mouth daily. Swallow whole., Disp: 30 tablet, Rfl: 12   atorvastatin (LIPITOR) 40 MG tablet, Take 1 tablet (40 mg total) by mouth daily., Disp: 90 tablet, Rfl: 3   cetirizine (ZYRTEC) 10 MG tablet, Take 10 mg by mouth daily., Disp: , Rfl:    EPINEPHrine (EPIPEN 2-PAK) 0.3 mg/0.3 mL IJ SOAJ injection, Inject 0.3 mg into the muscle as needed for anaphylaxis., Disp: 2 each, Rfl: 0   fluticasone (FLONASE SENSIMIST) 27.5 MCG/SPRAY nasal spray, Place 2 sprays into the nose daily., Disp: 10 g, Rfl: 12   metoprolol succinate (TOPROL-XL) 25 MG 24 hr tablet, Take 1 tablet (25 mg total) by mouth daily., Disp: 90 tablet, Rfl: 3    nitroGLYCERIN (NITROSTAT) 0.4 MG SL tablet, Place 1 tablet (0.4 mg total) under the tongue every 5 (five) minutes as needed for chest pain. Then call 911 or go to ER, Disp: 50 tablet, Rfl: 0   ranolazine (RANEXA) 500 MG 12 hr tablet, Take 1 tablet (500 mg total) by mouth 2 (two) times daily., Disp: 60 tablet, Rfl: 3   sertraline (ZOLOFT) 100 MG tablet, TAKE 1 AND 1/2 TABLETS(150 MG TOTAL) DAILY, Disp: 90 tablet, Rfl: 0   silodosin (RAPAFLO) 8 MG CAPS capsule, TAKE 1 CAPSULE (8 MG TOTAL) BY MOUTH DAILY AS NEEDED. FOR STONE SYMPTOMS., Disp: 30 capsule, Rfl: 0   solifenacin (VESICARE) 5 MG tablet, Take 1 tablet (5 mg total) by mouth daily., Disp: 30 tablet, Rfl: 11   ticagrelor (BRILINTA) 90 MG TABS tablet, Take 1 tablet (90 mg total) by mouth 2 (two) times daily., Disp: 180 tablet, Rfl: 1   traZODone (DESYREL) 100 MG tablet, TAKE 1 TO 2 TABLETS AT     BEDTIME AS NEEDED FOR SLEEP, Disp: 180 tablet, Rfl: 3   Medications ordered in this encounter:  Meds ordered this encounter  Medications   azithromycin (ZITHROMAX) 250 MG tablet    Sig: Take 2 tablets on day 1, then 1 tablet daily on days 2 through 5    Dispense:  6 tablet    Refill:  0    Supervising Provider:   Merrilee Jansky [phone]  predniSONE (DELTASONE) 20 MG tablet    Sig: Take 2 tablets (40 mg total) by mouth daily with breakfast for 5 days.    Dispense:  10 tablet    Refill:  0    Supervising Provider:   Merrilee Jansky [[id number]]     *If you need refills on other medications prior to your next appointment, please contact your pharmacy*  Follow-Up: Call back or seek an in-person evaluation if the symptoms worsen or if the condition fails to improve as anticipated.  [facility] [phone]  Other Instructions  - Take meds as prescribed - Rest voice - Use a cool mist humidifier especially during the winter months when heat dries out the air. - Use saline nose sprays frequently to help soothe nasal passages  if they are drying out. - Stay hydrated by drinking plenty of fluids - Mucinex for congestion  - Keep thermostat turn down low to prevent drying out which can cause a dry cough. - For any cough or congestion- robitussin DM or Delsym as needed - For fever or aches or pains- take tylenol or ibuprofen as directed on bottle             * for fevers greater than 101 orally you may alternate ibuprofen and tylenol every 3 hours.  If you do not improve you will need a follow up visit in person.                  If you have been instructed to have an in-person evaluation today at a local Urgent Care facility, please use the link below. It will take you to a list of all of our available [facility], including address, phone number and hours of operation. Please do not delay care.  [facility]  If you or a family member do not have a primary care provider, use the link below to schedule a visit and establish care. When you choose a [facility] primary care physician or advanced practice provider, you gain a long-term partner in health. Find a Primary Care Provider  Learn more about [facility]'s in-office and virtual care options: [facility] - Get Care Now

## 2023-07-28 NOTE — Progress Notes (Deleted)
 Name: Sydney [name] DOB: [birth year] MRN: [id number]  History of Present Illness: Ms. Sydney [name] is a 63 y.o. female who presents today for follow up visit at [facility] [place]. ***She is accompanied by ***. - GU History: 1. OAB with urinary frequency, nocturia, urgency, and urge incontinence. 2. Kidney stones.   At last visit on 06/22/2023: - Had prior success with Vesicare  5 mg daily but had been off it for a few months due to loss of health insurance. - Reports morning caffeine intake (coffee); tries to avoid caffeine in the afternoon / evening.  - Reported intermittent LLQ abdominal pain x1 month.  - Advised the following: 1. KUB & RUS.  2. Rapaflo  (Silodosin ) 8 mg daily PRN for stone symptoms. 3. Restart Vesicare  5 mg daily. 4. Minimize caffeine intake. 5. Return in about 6 weeks (around 08/03/2023) for UA, PVR, & f/u with Sydney Oz NP.  Since last visit: > 06/29/2023:  - RUS showed no stones, masses, or hydronephrosis. - KUB showed a 5 mm left kidney stone lower pole stone.   Today: She {Actions; denies-reports:120008} symptomatic improvement since restarting Vesicare  (Solifenacin ) 5 mg daily.  She reports {Blank multiple:19197::improved,persistent / unchanged} urinary ***frequency, ***nocturia, ***urgency, and ***urge incontinence. Voiding ***x/day and ***x/night on average. Leaking ***x/day on average; using *** ***pads / ***diapers per day on average.  She {Actions; denies-reports:120008} dysuria, gross hematuria, straining to void, or sensations of incomplete emptying.  She {Actions; denies-reports:120008} flank pain or abdominal pain.   Fall Screening: Do you usually have a device to assist in your mobility? {yes/no:20286}  ***cane / ***walker / ***wheelchair  Medications: Current Outpatient Medications  Medication Sig Dispense Refill   albuterol  (VENTOLIN  HFA) 108 (90 Base) MCG/ACT inhaler Inhale 2 puffs into the lungs every 6 (six) hours as  needed for wheezing or shortness of breath. 18 g 0   amLODipine  (NORVASC ) 5 MG tablet Take 1 tablet (5 mg total) by mouth daily. 90 tablet 3   aspirin  EC 81 MG tablet Take 1 tablet (81 mg total) by mouth daily. Swallow whole. 30 tablet 12   atorvastatin  (LIPITOR) 40 MG tablet Take 1 tablet (40 mg total) by mouth daily. 90 tablet 3   cetirizine (ZYRTEC) 10 MG tablet Take 10 mg by mouth daily.     EPINEPHrine  (EPIPEN  2-PAK) 0.3 mg/0.3 mL IJ SOAJ injection Inject 0.3 mg into the muscle as needed for anaphylaxis. 2 each 0   fluticasone (FLONASE  SENSIMIST) 27.5 MCG/SPRAY nasal spray Place 2 sprays into the nose daily. 10 g 12   metoprolol  succinate (TOPROL -XL) 25 MG 24 hr tablet Take 1 tablet (25 mg total) by mouth daily. 90 tablet 3   nitroGLYCERIN  (NITROSTAT ) 0.4 MG SL tablet Place 1 tablet (0.4 mg total) under the tongue every 5 (five) minutes as needed for chest pain. Then call 911 or go to ER 50 tablet 0   ranolazine  (RANEXA ) 500 MG 12 hr tablet Take 1 tablet (500 mg total) by mouth 2 (two) times daily. 60 tablet 3   sertraline  (ZOLOFT ) 100 MG tablet TAKE 1 AND 1/2 TABLETS(150 MG TOTAL) DAILY 90 tablet 0   silodosin  (RAPAFLO ) 8 MG CAPS capsule TAKE 1 CAPSULE (8 MG TOTAL) BY MOUTH DAILY AS NEEDED. FOR STONE SYMPTOMS. 30 capsule 0   solifenacin  (VESICARE ) 5 MG tablet Take 1 tablet (5 mg total) by mouth daily. 30 tablet 11   ticagrelor  (BRILINTA ) 90 MG TABS tablet Take 1 tablet (90 mg total) by mouth 2 (two) times daily. 180  tablet 1   traZODone  (DESYREL ) 100 MG tablet TAKE 1 TO 2 TABLETS AT     BEDTIME AS NEEDED FOR SLEEP 180 tablet 3   No current facility-administered medications for this visit.    Allergies: Allergies  Allergen Reactions   Bee Pollen Anaphylaxis and Swelling   Bee Venom Anaphylaxis and Swelling   Butorphanol Other (See Comments)    Not sure told by md she was allergic after surgery.    Meloxicam Anxiety    Mood disorder Altered her personality    Penicillins  Anaphylaxis and Hives    Unable to Recall As child mom was told she is highly allergic Did it involve swelling of the face/tongue/throat, SOB, or low BP? Unknown Did it involve sudden or severe rash/hives, skin peeling, or any reaction on the inside of your mouth or nose? Unknown Did you need to seek medical attention at a hospital or doctor's office? Unknown When did it last happen?      childhood allergy If all above answers are "NO", may proceed with cephalosporin use.    Prochlorperazine Edisylate Anaphylaxis   Sulfa Antibiotics Anaphylaxis    Unable to Recall   Tramadol Anxiety    Didn't like the way it made her feel    Rosuvastatin  Other (See Comments)    Per pt, keeps her awake when she takes rosuvastatin  daily so decreased to every other day    Clindamycin  Hives   Levaquin  [Levofloxacin ] Itching   Topiramate Nausea And Vomiting   Doxycycline  Dermatitis, Hives, Itching, Photosensitivity and Rash    Past Medical History:  Diagnosis Date   Allergy    Anemia    history of   Anxiety    Arthritis    Asthma    Atherosclerosis    Depression    Family history of adverse reaction to anesthesia    pt's mother felt everything and couldn't speak during a gallbladder surgery   GERD (gastroesophageal reflux disease)    History of hiatal hernia    History of kidney stones    Hypertension    Pneumonia    a few times   TIA (transient ischemic attack) 2012   Past Surgical History:  Procedure Laterality Date   ABDOMINAL HYSTERECTOMY     abdominal   APPENDECTOMY     bladder tack     CARPAL TUNNEL RELEASE Bilateral    CERVICAL SPINE SURGERY     5-6, 6-7   CHOLECYSTECTOMY     COLONOSCOPY WITH PROPOFOL  N/A 07/10/2020   Procedure: COLONOSCOPY WITH PROPOFOL ;  Surgeon: Cindie Carlin POUR, DO;  Location: [facility];  Service: Endoscopy;  Laterality: N/A;  12:15pm   CORONARY STENT INTERVENTION N/A 02/03/2023   Procedure: CORONARY STENT INTERVENTION;  Surgeon: Anner Alm ORN, MD;   Location: [facility] INVASIVE CV LAB;  Service: Cardiovascular;  Laterality: N/A;   ENDOVENOUS ABLATION SAPHENOUS VEIN W/ LASER Left 11/28/2021   endovenous laser ablation left greater saphenous vein by Gaile New MD   HAMMER TOE SURGERY Right    JOINT REPLACEMENT     LEFT HEART CATH AND CORONARY ANGIOGRAPHY N/A 02/03/2023   Procedure: LEFT HEART CATH AND CORONARY ANGIOGRAPHY;  Surgeon: Anner Alm ORN, MD;  Location: [facility] INVASIVE CV LAB;  Service: Cardiovascular;  Laterality: N/A;   TOTAL HIP 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   Procedure: LEFT TOTAL HIP ARTHROPLASTY -DIRECT ANTERIOR;  Surgeon: Barbarann Oneil BROCKS, MD;  Location: [facility] OR;  Service: Orthopedics;  Laterality: Left;   TOTAL HIP ARTHROPLASTY Right 12/05/2019  Procedure: RIGHT TOTAL HIP ARTHROPLASTY ANTERIOR APPROACH  DIRECT ANTERIOR;  Surgeon: Barbarann Oneil BROCKS, MD;  Location: [facility] OR;  Service: Orthopedics;  Laterality: Right;   Family History  Problem Relation Age of Onset   Anxiety disorder Mother    Depression Mother    Heart disease Mother    Hypertension Mother    Arrhythmia Mother    Cancer Father        colon, age greater than 25   Lung cancer Father    Migraines Sister    Cancer Daughter        nerve sheath sarcoma   Alcohol abuse Brother    Breast cancer Neg Hx    Social History   Socioeconomic History   Marital status: Married    Spouse name: Not on file   Number of children: 3   Years of education: Not on file   Highest education level: 12th grade  Occupational History   Not on file  Tobacco Use   Smoking status: Every Day    Current packs/day: 0.50    Average packs/day: 0.5 packs/day for 23.0 years (11.5 ttl pk-yrs)    Types: Cigarettes   Smokeless tobacco: Never  Vaping Use   Vaping status: Never Used  Substance and Sexual Activity   Alcohol use: Yes    Comment: occ   Drug use: Never   Sexual activity: Not Currently  Other Topics Concern   Not on file  Social History Narrative   [place]  [place] at home with her husband.   She has 3 children but 1 passed away from cancer.   Social Drivers of Corporate Investment Banker Strain: Low Risk  (10/30/2022)   Overall Financial Resource Strain (CARDIA)    Difficulty of Paying Living Expenses: Not hard at all  Food Insecurity: No Food Insecurity (02/02/2023)   Hunger Vital Sign    Worried About Running Out of Food in the Last Year: Never true    Ran Out of Food in the Last Year: Never true  Transportation Needs: No Transportation Needs (02/02/2023)   PRAPARE - Administrator, Civil Service (Medical): No    Lack of Transportation (Non-Medical): No  Physical Activity: Unknown (10/30/2022)   Exercise Vital Sign    Days of Exercise per Week: 2 days    Minutes of Exercise per Session: Patient declined  Stress: Stress Concern Present (10/30/2022)   [employer] of Occupational Health - Occupational Stress Questionnaire    Feeling of Stress : To some extent  Social Connections: Unknown (10/30/2022)   Social Connection and Isolation Panel [NHANES]    Frequency of Communication with Friends and Family: Twice a week    Frequency of Social Gatherings with Friends and Family: Once a week    Attends Religious Services: Patient declined    Database Administrator or Organizations: No    Attends Engineer, Structural: Not on file    Marital Status: Married  Catering Manager Violence: Not At Risk (02/02/2023)   Humiliation, Afraid, Rape, and Kick questionnaire    Fear of Current or Ex-Partner: No    Emotionally Abused: No    Physically Abused: No    Sexually Abused: No    SUBJECTIVE  Review of Systems Constitutional: Patient denies any unintentional weight loss or change in strength lntegumentary: Patient denies any rashes or pruritus Cardiovascular: Patient denies chest pain or syncope Respiratory: Patient denies shortness of breath Gastrointestinal:  Patient ***denies nausea,  vomiting, constipation, or diarrhea Musculoskeletal: Patient denies muscle cramps or weakness Neurologic: Patient denies convulsions or seizures Allergic/Immunologic: Patient denies recent allergic reaction(s) Hematologic/Lymphatic: Patient denies bleeding tendencies Endocrine: Patient denies heat/cold intolerance  GU: As per HPI.  OBJECTIVE There were no vitals filed for this visit. There is no height or weight on file to calculate BMI.  Physical Examination Constitutional: No obvious distress; patient is non-toxic appearing  Cardiovascular: No visible lower extremity edema.  Respiratory: The patient does not have audible wheezing/stridor; respirations do not appear labored  Gastrointestinal: Abdomen non-distended Musculoskeletal: Normal ROM of UEs  Skin: No obvious rashes/open sores  Neurologic: CN 2-12 grossly intact Psychiatric: Answered questions appropriately with normal affect  Hematologic/Lymphatic/Immunologic: No obvious bruises or sites of spontaneous bleeding  UA: ***negative / *** WBC/hpf, *** RBC/hpf, *** bacteria ***Urine microscopy: ***negative / *** WBC/hpf, *** RBC/hpf, *** bacteria ***with no evidence of UTI ***with no evidence of microscopic hematuria ***otherwise unremarkable  PVR: *** ml  ASSESSMENT No diagnosis found.  ***We reviewed recent imaging results; ***awaiting radiology results, appears to have ***no acute findings per provider interpretation.  ***For stone prevention: Advised adequate hydration and we discussed option to consider low oxalate diet given that calcium  oxalate is the most common type of stone. Handout provided about stone prevention diet.  ***For recurrent stone formers: We discussed option to proceed with 24 hour urinalysis ([facility]) for metabolic stone evaluation, which may help with targeted recommendations for dietary I medication therapies for stone prevention. Patient elected to ***proceed/ ***hold off.  Will plan to follow  up in ***6 months / ***1 year with ***KUB ***RUS for stone surveillance or sooner if needed.  Pt verbalized understanding and agreement. All questions were answered.  PLAN Advised the following: Maintain adequate fluid intake daily. Drink citrus juice (lemon, lime or orange juice) routinely. Low oxalate diet. No follow-ups on file.  No orders of the defined types were placed in this encounter.   It has been explained that the patient is to follow regularly with their PCP in addition to all other providers involved in their care and to follow instructions provided by these respective offices. Patient advised to contact urology clinic if any urologic-pertaining questions, concerns, new symptoms or problems arise in the interim period.  There are no Patient Instructions on file for this visit.  Electronically signed by:  Sydney KYM Oz, MSN, FNP-C, CUNP 07/28/2023 4:54 PM

## 2023-08-03 ENCOUNTER — Ambulatory Visit: Payer: Medicare HMO | Admitting: Urology

## 2023-08-03 DIAGNOSIS — N200 Calculus of kidney: Secondary | ICD-10-CM

## 2023-08-03 DIAGNOSIS — N3281 Overactive bladder: Secondary | ICD-10-CM

## 2023-08-03 DIAGNOSIS — N3941 Urge incontinence: Secondary | ICD-10-CM

## 2023-08-07 ENCOUNTER — Encounter: Payer: Self-pay | Admitting: Family Medicine

## 2023-08-07 ENCOUNTER — Ambulatory Visit (INDEPENDENT_AMBULATORY_CARE_PROVIDER_SITE_OTHER): Payer: Medicare (Managed Care) | Admitting: Family Medicine

## 2023-08-07 VITALS — BP 107/61 | HR 67 | Temp 98.3°F | Wt 211.8 lb

## 2023-08-07 DIAGNOSIS — J209 Acute bronchitis, unspecified: Secondary | ICD-10-CM | POA: Diagnosis not present

## 2023-08-07 DIAGNOSIS — R0981 Nasal congestion: Secondary | ICD-10-CM

## 2023-08-07 DIAGNOSIS — M5442 Lumbago with sciatica, left side: Secondary | ICD-10-CM | POA: Diagnosis not present

## 2023-08-07 DIAGNOSIS — M50122 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 level with radiculopathy: Secondary | ICD-10-CM | POA: Diagnosis not present

## 2023-08-07 DIAGNOSIS — Z79899 Other long term (current) drug therapy: Secondary | ICD-10-CM

## 2023-08-07 DIAGNOSIS — M7062 Trochanteric bursitis, left hip: Secondary | ICD-10-CM

## 2023-08-07 DIAGNOSIS — J3489 Other specified disorders of nose and nasal sinuses: Secondary | ICD-10-CM

## 2023-08-07 DIAGNOSIS — G8929 Other chronic pain: Secondary | ICD-10-CM

## 2023-08-07 DIAGNOSIS — G894 Chronic pain syndrome: Secondary | ICD-10-CM

## 2023-08-07 MED ORDER — ALBUTEROL SULFATE HFA 108 (90 BASE) MCG/ACT IN AERS: 2.0000 | INHALATION_SPRAY | Freq: Four times a day (QID) | RESPIRATORY_TRACT | 0 refills | Status: AC | PRN

## 2023-08-07 MED ORDER — IPRATROPIUM-ALBUTEROL 0.5-2.5 (3) MG/3ML IN SOLN
3.0000 mL | Freq: Once | RESPIRATORY_TRACT | Status: AC
  Administered 2023-08-07: 3 mL via RESPIRATORY_TRACT

## 2023-08-07 MED ORDER — AZELASTINE HCL 0.1 % NA SOLN: 1.0000 | Freq: Two times a day (BID) | NASAL | 12 refills | Status: DC

## 2023-08-07 MED ORDER — HYDROCODONE-ACETAMINOPHEN 5-325 MG PO TABS: 1.0000 | ORAL_TABLET | Freq: Four times a day (QID) | ORAL | 0 refills | Status: DC | PRN

## 2023-08-07 NOTE — Progress Notes (Signed)
Subjective: CC: Chronic follow-up PCP: Sydney Ip, DO UJW:JXBJ Santillana is a 63 y.o. female presenting to clinic today for:  1.  Chronic pain management Patient reports that she still has about 8 tablets of Norco leftover from October.  She did experience some increased physical pain during the month that she was sick last month.  She still has some rhinorrhea and postnasal drainage.  Is going to resume her cetirizine.  Also notes some left-sided trochanteric bursitis.  Going to contact her orthopedist for injections soon  Still having some coughing and dyspnea with exertion but is status post treatment Z-Pak and prednisone and overall feels quite a bit better.  No hemoptysis.  No unplanned weight loss or night sweats.  Needs refill of albuterol   ROS: Per HPI  Allergies  Allergen Reactions   Bee Pollen Anaphylaxis and Swelling   Bee Venom Anaphylaxis and Swelling   Butorphanol Other (See Comments)    Not sure told by md she was allergic after surgery.    Meloxicam Anxiety    Mood disorder Altered her personality    Penicillins Anaphylaxis and Hives    Unable to Recall As child mom was told she is highly allergic Did it involve swelling of the face/tongue/throat, SOB, or low BP? Unknown Did it involve sudden or severe rash/hives, skin peeling, or any reaction on the inside of your mouth or nose? Unknown Did you need to seek medical attention at a hospital or doctor's office? Unknown When did it last happen?      childhood allergy If all above answers are "NO", may proceed with cephalosporin use.    Prochlorperazine Edisylate Anaphylaxis   Sulfa Antibiotics Anaphylaxis    Unable to Recall   Tramadol Anxiety    Didn't like the way it made her feel    Rosuvastatin Other (See Comments)    Per pt, keeps her awake when she takes rosuvastatin daily so decreased to every other day    Clindamycin Hives   Levaquin [Levofloxacin] Itching   Topiramate Nausea And Vomiting    Doxycycline Dermatitis, Hives, Itching, Photosensitivity and Rash   Past Medical History:  Diagnosis Date   Allergy    Anemia    history of   Anxiety    Arthritis    Asthma    Atherosclerosis    Depression    Family history of adverse reaction to anesthesia    pt's mother felt everything and couldn't speak during a gallbladder surgery   GERD (gastroesophageal reflux disease)    History of hiatal hernia    History of kidney stones    Hypertension    Pneumonia    a few times   TIA (transient ischemic attack) 2012    Current Outpatient Medications:    albuterol (VENTOLIN HFA) 108 (90 Base) MCG/ACT inhaler, Inhale 2 puffs into the lungs every 6 (six) hours as needed for wheezing or shortness of breath., Disp: 18 g, Rfl: 0   amLODipine (NORVASC) 5 MG tablet, Take 1 tablet (5 mg total) by mouth daily., Disp: 90 tablet, Rfl: 3   aspirin EC 81 MG tablet, Take 1 tablet (81 mg total) by mouth daily. Swallow whole., Disp: 30 tablet, Rfl: 12   atorvastatin (LIPITOR) 40 MG tablet, Take 1 tablet (40 mg total) by mouth daily., Disp: 90 tablet, Rfl: 3   cetirizine (ZYRTEC) 10 MG tablet, Take 10 mg by mouth daily., Disp: , Rfl:    EPINEPHrine (EPIPEN 2-PAK) 0.3 mg/0.3 mL IJ SOAJ injection,  Inject 0.3 mg into the muscle as needed for anaphylaxis., Disp: 2 each, Rfl: 0   fluticasone (FLONASE SENSIMIST) 27.5 MCG/SPRAY nasal spray, Place 2 sprays into the nose daily., Disp: 10 g, Rfl: 12   metoprolol succinate (TOPROL-XL) 25 MG 24 hr tablet, Take 1 tablet (25 mg total) by mouth daily., Disp: 90 tablet, Rfl: 3   nitroGLYCERIN (NITROSTAT) 0.4 MG SL tablet, Place 1 tablet (0.4 mg total) under the tongue every 5 (five) minutes as needed for chest pain. Then call 911 or go to ER, Disp: 50 tablet, Rfl: 0   ranolazine (RANEXA) 500 MG 12 hr tablet, Take 1 tablet (500 mg total) by mouth 2 (two) times daily., Disp: 60 tablet, Rfl: 3   sertraline (ZOLOFT) 100 MG tablet, TAKE 1 AND 1/2 TABLETS(150 MG TOTAL)  DAILY, Disp: 90 tablet, Rfl: 0   silodosin (RAPAFLO) 8 MG CAPS capsule, TAKE 1 CAPSULE (8 MG TOTAL) BY MOUTH DAILY AS NEEDED. FOR STONE SYMPTOMS., Disp: 30 capsule, Rfl: 0   solifenacin (VESICARE) 5 MG tablet, Take 1 tablet (5 mg total) by mouth daily., Disp: 30 tablet, Rfl: 11   ticagrelor (BRILINTA) 90 MG TABS tablet, Take 1 tablet (90 mg total) by mouth 2 (two) times daily., Disp: 180 tablet, Rfl: 1   traZODone (DESYREL) 100 MG tablet, TAKE 1 TO 2 TABLETS AT     BEDTIME AS NEEDED FOR SLEEP, Disp: 180 tablet, Rfl: 3 Social History   Socioeconomic History   Marital status: Married    Spouse name: Not on file   Number of children: 3   Years of education: Not on file   Highest education level: Associate degree: occupational, Scientist, product/process development, or vocational program  Occupational History   Not on file  Tobacco Use   Smoking status: Every Day    Current packs/day: 0.50    Average packs/day: 0.5 packs/day for 23.0 years (11.5 ttl pk-yrs)    Types: Cigarettes   Smokeless tobacco: Never  Vaping Use   Vaping status: Never Used  Substance and Sexual Activity   Alcohol use: Yes    Comment: occ   Drug use: Never   Sexual activity: Not Currently  Other Topics Concern   Not on file  Social History Narrative   Recently relocated to [place] from [place].  She resides at home with her husband.   She has 3 children but 1 passed away from cancer.   Social Drivers of Corporate investment banker Strain: Low Risk  (08/03/2023)   Overall Financial Resource Strain (CARDIA)    Difficulty of Paying Living Expenses: Not very hard  Food Insecurity: No Food Insecurity (08/03/2023)   Hunger Vital Sign    Worried About Running Out of Food in the Last Year: Never true    Ran Out of Food in the Last Year: Never true  Transportation Needs: No Transportation Needs (08/03/2023)   PRAPARE - Administrator, Civil Service (Medical): No    Lack of Transportation (Non-Medical): No  Physical Activity:  Insufficiently Active (08/03/2023)   Exercise Vital Sign    Days of Exercise per Week: 1 day    Minutes of Exercise per Session: 30 min  Stress: Stress Concern Present (08/03/2023)   [employer] of Occupational Health - Occupational Stress Questionnaire    Feeling of Stress : To some extent  Social Connections: Moderately Integrated (08/03/2023)   Social Connection and Isolation Panel [NHANES]    Frequency of Communication with Friends and Family: More than three  times a week    Frequency of Social Gatherings with Friends and Family: Once a week    Attends Religious Services: 1 to 4 times per year    Active Member of [employer] or Organizations: No    Attends Engineer, structural: Not on file    Marital Status: Married  Catering manager Violence: Not At Risk (02/02/2023)   Humiliation, Afraid, Rape, and Kick questionnaire    Fear of Current or Ex-Partner: No    Emotionally Abused: No    Physically Abused: No    Sexually Abused: No   Family History  Problem Relation Age of Onset   Anxiety disorder Mother    Depression Mother    Heart disease Mother    Hypertension Mother    Arrhythmia Mother    Cancer Father        colon, age greater than 32   Lung cancer Father    Migraines Sister    Cancer Daughter        nerve sheath sarcoma   Alcohol abuse Brother    Breast cancer Neg Hx     Objective: Office vital signs reviewed. BP 107/61   Pulse 67   Temp 98.3 F (36.8 C)   Wt 211 lb 12.8 oz (96.1 kg)   SpO2 96%   BMI 35.25 kg/m   Physical Examination:  General: Awake, alert, well nourished, No acute distress HEENT: sclera white, MMM Cardio: regular rate and rhythm, S1S2 heard, no murmurs appreciated Pulm: Rhonchi appreciated anteriorly.  Otherwise clear to auscultation bilaterally, no wheezes, rhonchi or rales; normal work of breathing on room air MSK: Ambulating with use of cane.  Gait is antalgic  Assessment/ Plan: 63 y.o. female   Cervical disc disorder at  C5-C6 level with radiculopathy - Plan: HYDROcodone-acetaminophen (NORCO) 5-325 MG tablet  Trochanteric bursitis, left hip  Chronic left-sided low back pain with left-sided sciatica - Plan: HYDROcodone-acetaminophen (NORCO) 5-325 MG tablet  Chronic pain syndrome - Plan: HYDROcodone-acetaminophen (NORCO) 5-325 MG tablet  Controlled substance agreement signed - Plan: ToxASSURE Select 13 (MW), Urine  Subacute bronchitis - Plan: ipratropium-albuterol (DUONEB) 0.5-2.5 (3) MG/3ML nebulizer solution 3 mL, albuterol (VENTOLIN HFA) 108 (90 Base) MCG/ACT inhaler  Nasal congestion with rhinorrhea - Plan: azelastine (ASTELIN) 0.1 % nasal spray  Norco renewed for as needed use.  UDS and CSA were updated as per office policy today.  The national narcotic database reviewed and there were no red flags.  Follow-up with orthopedist for left hip bursitis  Given a DuoNeb here in office.  Albuterol renewed.  Astelin prescribed for nasal rhinorrhea.  Follow-up in 6 months for annual physical fasting labs, sooner if concerns arise   Sydney Pottle Hulen Skains, DO [facility] [phone]

## 2023-08-12 LAB — TOXASSURE SELECT 13 (MW), URINE

## 2023-08-21 ENCOUNTER — Ambulatory Visit: Payer: Medicare HMO | Admitting: Urology

## 2023-08-28 ENCOUNTER — Encounter (HOSPITAL_COMMUNITY): Payer: Self-pay

## 2023-08-28 ENCOUNTER — Emergency Department (HOSPITAL_COMMUNITY): Payer: Medicare (Managed Care)

## 2023-08-28 ENCOUNTER — Other Ambulatory Visit: Payer: Self-pay

## 2023-08-28 ENCOUNTER — Emergency Department (HOSPITAL_COMMUNITY)
Admission: EM | Admit: 2023-08-28 | Discharge: 2023-08-28 | Disposition: A | Discharge status: Home / Self Care | Payer: Medicare (Managed Care) | Attending: Emergency Medicine | Admitting: Emergency Medicine

## 2023-08-28 DIAGNOSIS — Z79899 Other long term (current) drug therapy: Secondary | ICD-10-CM | POA: Diagnosis not present

## 2023-08-28 DIAGNOSIS — I4891 Unspecified atrial fibrillation: Secondary | ICD-10-CM | POA: Insufficient documentation

## 2023-08-28 DIAGNOSIS — Z7901 Long term (current) use of anticoagulants: Secondary | ICD-10-CM | POA: Diagnosis not present

## 2023-08-28 DIAGNOSIS — Z7982 Long term (current) use of aspirin: Secondary | ICD-10-CM | POA: Insufficient documentation

## 2023-08-28 DIAGNOSIS — R0789 Other chest pain: Secondary | ICD-10-CM | POA: Diagnosis present

## 2023-08-28 DIAGNOSIS — J45909 Unspecified asthma, uncomplicated: Secondary | ICD-10-CM | POA: Diagnosis not present

## 2023-08-28 DIAGNOSIS — I10 Essential (primary) hypertension: Secondary | ICD-10-CM | POA: Insufficient documentation

## 2023-08-28 DIAGNOSIS — F439 Reaction to severe stress, unspecified: Secondary | ICD-10-CM | POA: Diagnosis not present

## 2023-08-28 DIAGNOSIS — E876 Hypokalemia: Secondary | ICD-10-CM | POA: Diagnosis not present

## 2023-08-28 HISTORY — DX: Acute myocardial infarction, unspecified: I21.9

## 2023-08-28 LAB — CBC
HCT: 44.4 % (ref 36.0–46.0)
Hemoglobin: 14.8 g/dL (ref 12.0–15.0)
MCH: 30.9 pg (ref 26.0–34.0)
MCHC: 33.3 g/dL (ref 30.0–36.0)
MCV: 92.7 fL (ref 80.0–100.0)
Platelets: 254 10*3/uL (ref 150–400)
RBC: 4.79 MIL/uL (ref 3.87–5.11)
RDW: 14.5 % (ref 11.5–15.5)
WBC: 8.6 10*3/uL (ref 4.0–10.5)
nRBC: 0 % (ref 0.0–0.2)

## 2023-08-28 LAB — BASIC METABOLIC PANEL
Anion gap: 10 (ref 5–15)
BUN: 9 mg/dL (ref 8–23)
CO2: 23 mmol/L (ref 22–32)
Calcium: 9.1 mg/dL (ref 8.9–10.3)
Chloride: 104 mmol/L (ref 98–111)
Creatinine, Ser: 0.74 mg/dL (ref 0.44–1.00)
GFR, Estimated: >60 mL/min (ref >60)
Glucose, Bld: 138 mg/dL — ABNORMAL HIGH (ref 70–99)
Potassium: 3.1 mmol/L — ABNORMAL LOW (ref 3.5–5.1)
Sodium: 137 mmol/L (ref 135–145)

## 2023-08-28 LAB — TROPONIN I (HIGH SENSITIVITY)
Troponin I (High Sensitivity): 9 ng/L (ref <18)
Troponin I (High Sensitivity): 22 ng/L — ABNORMAL HIGH (ref <18)

## 2023-08-28 LAB — MAGNESIUM: Magnesium: 2.1 mg/dL (ref 1.7–2.4)

## 2023-08-28 MED ORDER — APIXABAN 5 MG PO TABS
5.0000 mg | ORAL_TABLET | Freq: Two times a day (BID) | ORAL | Status: DC
  Administered 2023-08-28: 5 mg via ORAL
  Filled 2023-08-28: qty 1

## 2023-08-28 MED ORDER — POTASSIUM CHLORIDE CRYS ER 20 MEQ PO TBCR: 20.0000 meq | EXTENDED_RELEASE_TABLET | Freq: Every day | ORAL | 0 refills | Status: DC

## 2023-08-28 MED ORDER — POTASSIUM CHLORIDE 10 MEQ/100ML IV SOLN
10.0000 meq | INTRAVENOUS | Status: AC
  Administered 2023-08-28 (×2): 10 meq via INTRAVENOUS
  Filled 2023-08-28 (×2): qty 100

## 2023-08-28 MED ORDER — DILTIAZEM HCL-DEXTROSE 125-5 MG/125ML-% IV SOLN (PREMIX)
5.0000 mg/h | INTRAVENOUS | Status: DC
  Administered 2023-08-28: 5 mg/h via INTRAVENOUS
  Filled 2023-08-28: qty 125

## 2023-08-28 MED ORDER — APIXABAN 5 MG PO TABS
5.0000 mg | ORAL_TABLET | Freq: Two times a day (BID) | ORAL | 0 refills | Status: DC
  Filled 2023-08-28: qty 60, 30d supply, fill #0

## 2023-08-28 MED ORDER — APIXABAN 5 MG PO TABS: 5.0000 mg | ORAL_TABLET | Freq: Two times a day (BID) | ORAL | 0 refills | Status: DC

## 2023-08-28 MED ORDER — DILTIAZEM LOAD VIA INFUSION
10.0000 mg | Freq: Once | INTRAVENOUS | Status: AC
  Administered 2023-08-28: 10 mg via INTRAVENOUS
  Filled 2023-08-28: qty 10

## 2023-08-28 NOTE — ED Notes (Signed)
 IV potassium paused due to pt irritation

## 2023-08-28 NOTE — ED Triage Notes (Signed)
 Pt to ED from home with c/o anxiety, with hx of panic attacks, pt says it has been a while since she had one, but her spouse died last week, she woke up and found him dead beside her, dealing with a lot of stress. Pt says she feels shooting pain in chest up sides of neck and she had similar sx with MI last June, just wants to make sure nothing is wrong with heart.

## 2023-08-29 ENCOUNTER — Other Ambulatory Visit (HOSPITAL_COMMUNITY): Payer: Self-pay

## 2023-08-29 NOTE — ED Provider Notes (Signed)
 [place] EMERGENCY DEPARTMENT AT [facility] Provider Note   CSN: [id number] Arrival date & time: 08/28/23  1905     History  Chief Complaint  Patient presents with   Panic Attack   Chest Pain    Sydney [name] is a 63 y.o. female.   Chest Pain Patient presents with chest pain and fast heart rate.  Began earlier today with a fast heart rate but states she has been feeling bad for the last 3 days.  States she found her husband dead next to around 6 days ago.  Has been under a lot of stress since.  Previous non-STEMI.  Does have some left-sided chest pain.  No swelling in her legs.    Past Medical History:  Diagnosis Date   Allergy    Anemia    history of   Anxiety    Arthritis    Asthma    Atherosclerosis    Depression    Family history of adverse reaction to anesthesia    pt's mother felt everything and couldn't speak during a gallbladder surgery   GERD (gastroesophageal reflux disease)    History of hiatal hernia    History of kidney stones    Hypertension    MI (myocardial infarction) (HCC)    Pneumonia    a few times   TIA (transient ischemic attack) 2012    Home Medications Prior to Admission medications   Medication Sig Start Date End Date Taking? Authorizing Provider  potassium chloride  SA (KLOR-CON  M) 20 MEQ tablet Take 1 tablet (20 mEq total) by mouth daily. 08/28/23  Yes Patsey Lot, MD  albuterol  (VENTOLIN  HFA) 108 (90 Base) MCG/ACT inhaler Inhale 2 puffs into the lungs every 6 (six) hours as needed for wheezing or shortness of breath. 08/07/23   Jolinda Norene HERO, DO  amLODipine  (NORVASC ) 5 MG tablet Take 1 tablet (5 mg total) by mouth daily. 11/03/22   Jolinda Norene HERO, DO  apixaban  (ELIQUIS ) 5 MG TABS tablet Take 1 tablet (5 mg total) by mouth 2 (two) times daily. 08/28/23   Patsey Lot, MD  aspirin  EC 81 MG tablet Take 1 tablet (81 mg total) by mouth daily. Swallow whole. 02/05/23   Maree, Pratik D, DO  atorvastatin  (LIPITOR) 40  MG tablet Take 1 tablet (40 mg total) by mouth daily. 06/04/23   Mallipeddi, Vishnu P, MD  azelastine  (ASTELIN ) 0.1 % nasal spray Place 1 spray into both nostrils 2 (two) times daily. 08/07/23   Jolinda Norene HERO, DO  cetirizine (ZYRTEC) 10 MG tablet Take 10 mg by mouth daily.    [provider]  EPINEPHrine  (EPIPEN  2-PAK) 0.3 mg/0.3 mL IJ SOAJ injection Inject 0.3 mg into the muscle as needed for anaphylaxis. 02/20/23   Jolinda Norene HERO, DO  fluticasone (FLONASE  SENSIMIST) 27.5 MCG/SPRAY nasal spray Place 2 sprays into the nose daily. 04/29/23   [facility] [place] Pool, NP  HYDROcodone -acetaminophen  (NORCO) 5-325 MG tablet Take 1 tablet by mouth every 6 (six) hours as needed for severe pain (pain score 7-10). 08/07/23   Jolinda Norene HERO, DO  metoprolol  succinate (TOPROL -XL) 25 MG 24 hr tablet Take 1 tablet (25 mg total) by mouth daily. 11/03/22   Jolinda Norene HERO, DO  nitroGLYCERIN  (NITROSTAT ) 0.4 MG SL tablet Place 1 tablet (0.4 mg total) under the tongue every 5 (five) minutes as needed for chest pain. Then call 911 or go to ER 02/20/23   Jolinda Norene M, DO  ranolazine  (RANEXA ) 500 MG 12 hr  tablet Take 1 tablet (500 mg total) by mouth 2 (two) times daily. 06/04/23   Mallipeddi, Vishnu P, MD  sertraline  (ZOLOFT ) 100 MG tablet TAKE 1 AND 1/2 TABLETS(150 MG TOTAL) DAILY 05/28/23   Jolinda Potter M, DO  silodosin  (RAPAFLO ) 8 MG CAPS capsule TAKE 1 CAPSULE (8 MG TOTAL) BY MOUTH DAILY AS NEEDED. FOR STONE SYMPTOMS. 07/18/23   Gerldine Lauraine BROCKS, FNP  solifenacin  (VESICARE ) 5 MG tablet Take 1 tablet (5 mg total) by mouth daily. 03/20/23   McKenzie, Belvie CROME, MD  ticagrelor  (BRILINTA ) 90 MG TABS tablet Take 1 tablet (90 mg total) by mouth 2 (two) times daily. 06/04/23   Mallipeddi, Vishnu P, MD  traZODone  (DESYREL ) 100 MG tablet TAKE 1 TO 2 TABLETS AT     BEDTIME AS NEEDED FOR SLEEP 04/04/22   Jolinda Potter M, DO      Allergies    Bee pollen, Bee venom, Butorphanol, Meloxicam,  Penicillins, Prochlorperazine edisylate, Sulfa antibiotics, Tramadol, Rosuvastatin , Clindamycin , Levaquin  [levofloxacin ], Topiramate, and Doxycycline     Review of Systems   Review of Systems  Cardiovascular:  Positive for chest pain.    Physical Exam Updated Vital Signs BP 109/65   Pulse 61   Temp 98.6 F (37 C) (Oral)   Resp 17   Ht 5' 5 (1.651 m)   Wt 94.8 kg   SpO2 97%   BMI 34.78 kg/m  Physical Exam Vitals and nursing note reviewed.  Cardiovascular:     Rate and Rhythm: Tachycardia present. Rhythm irregular.  Pulmonary:     Breath sounds: No decreased breath sounds or wheezing.  Chest:     Chest wall: No tenderness.  Musculoskeletal:     Right lower leg: No edema.     Left lower leg: No edema.  Neurological:     Mental Status: She is alert.     ED Results / Procedures / Treatments   Labs (all labs ordered are listed, but only abnormal results are displayed) Labs Reviewed  BASIC METABOLIC PANEL - Abnormal; Notable for the following components:      Result Value   Potassium 3.1 (*)    Glucose, Bld 138 (*)    All other components within normal limits  TROPONIN I (HIGH SENSITIVITY) - Abnormal; Notable for the following components:   Troponin I (High Sensitivity) 22 (*)    All other components within normal limits  CBC  MAGNESIUM   TROPONIN I (HIGH SENSITIVITY)    EKG EKG Interpretation Date/Time:  Friday August 28 2023 19:29:04 EST Ventricular Rate:  143 PR Interval:    QRS Duration:  112 QT Interval:  402 QTC Calculation: 620 R Axis:   34  Text Interpretation: Atrial flutter with 2:1 A-V conduction Abnormal ECG When compared with ECG of 04-Feb-2023 06:23, Significant changes have occurred atrial flutter is new Confirmed by Patsey Lot [phone]) on 08/28/2023 8:06:11 PM  Radiology DG Chest Portable 1 View Result Date: 08/28/2023 CLINICAL DATA:  sob EXAM: PORTABLE CHEST 1 VIEW COMPARISON:  Chest x-ray 02/02/2023, CT chest 02/02/2023 FINDINGS: The  heart and mediastinal contours are unchanged. Atherosclerotic plaque No focal consolidation. Chronic coarsened interstitial markings with no overt pulmonary edema. No pleural effusion. No pneumothorax. No acute osseous abnormality. IMPRESSION: 1. No active disease. 2.  Aortic Atherosclerosis (ICD10-I70.0). Electronically Signed   By: Morgane  Naveau M.D.   On: 08/28/2023 20:17    Procedures Procedures    Medications Ordered in ED Medications  diltiazem  (CARDIZEM ) 1 mg/mL load via infusion 10 mg (10 mg  Intravenous Bolus from Bag 08/28/23 2008)    And  diltiazem  (CARDIZEM ) 125 mg in dextrose  5% 125 mL (1 mg/mL) infusion (0 mg/hr Intravenous Stopped 08/28/23 2348)  apixaban  (ELIQUIS ) tablet 5 mg (5 mg Oral Given 08/28/23 2140)  potassium chloride  10 mEq in 100 mL IVPB (0 mEq Intravenous Paused 08/28/23 2255)    ED Course/ Medical Decision Making/ A&P         CHA2DS2-VASc Score: 3                        Medical Decision Making Amount and/or Complexity of Data Reviewed Labs: ordered. Radiology: ordered.  Risk Prescription drug management.   Patient presented with regular tachycardia.  Appears to be in atrial flutter.  Discussed EKG with Dr. Wonda at thought to be likely atrial flutter not other arrhythmia such as WPW.  Initial plan was potentially for ED cardioversion, however patient states has been feeling bad for around 3 days.  She could have been in atrial flutter during this time do not think she is a candidate for cardioversion because of it.  Has some mild hypokalemia and was supplemented.  IV Cardizem  started and patient converted back to sinus rhythm.  Cardizem  stopped and rate has improved.  Remained in sinus rhythm.  Appears stable for discharge home with short-term follow-up with cardiology. CRITICAL CARE Performed by: [place] Total critical care time: 30 minutes Critical care time was exclusive of separately billable procedures and treating other patients. Critical care  was necessary to treat or prevent imminent or life-threatening deterioration. Critical care was time spent personally by me on the following activities: development of treatment plan with patient and/or surrogate as well as nursing, discussions with consultants, evaluation of patient's response to treatment, examination of patient, obtaining history from patient or surrogate, ordering and performing treatments and interventions, ordering and review of laboratory studies, ordering and review of radiographic studies, pulse oximetry and re-evaluation of patient's condition.          Final Clinical Impression(s) / ED Diagnoses Final diagnoses:  Atrial fibrillation with rapid ventricular response (HCC)  Hypokalemia    Rx / DC Orders ED Discharge Orders          Ordered    apixaban  (ELIQUIS ) 5 MG TABS tablet  2 times daily,   Status:  Discontinued        08/28/23 2319    Ambulatory referral to Cardiology       Comments: If you have not heard from the Cardiology office within the next 72 hours please call [phone].   08/28/23 2320    apixaban  (ELIQUIS ) 5 MG TABS tablet  2 times daily        08/28/23 2320    potassium chloride  SA (KLOR-CON  M) 20 MEQ tablet  Daily        08/28/23 2320              River Rankin, MD 08/29/23 [phone]

## 2023-08-31 ENCOUNTER — Telehealth (INDEPENDENT_AMBULATORY_CARE_PROVIDER_SITE_OTHER): Payer: Medicare (Managed Care) | Admitting: Family Medicine

## 2023-08-31 ENCOUNTER — Encounter: Payer: Self-pay | Admitting: Family Medicine

## 2023-08-31 ENCOUNTER — Telehealth: Payer: Self-pay | Admitting: Internal Medicine

## 2023-08-31 DIAGNOSIS — E876 Hypokalemia: Secondary | ICD-10-CM

## 2023-08-31 DIAGNOSIS — F4321 Adjustment disorder with depressed mood: Secondary | ICD-10-CM | POA: Diagnosis not present

## 2023-08-31 DIAGNOSIS — I4891 Unspecified atrial fibrillation: Secondary | ICD-10-CM | POA: Diagnosis not present

## 2023-08-31 MED ORDER — APIXABAN 5 MG PO TABS: 5.0000 mg | ORAL_TABLET | Freq: Two times a day (BID) | ORAL | 0 refills | Status: DC

## 2023-08-31 MED ORDER — HYDROXYZINE PAMOATE 25 MG PO CAPS: 25.0000 mg | ORAL_CAPSULE | Freq: Three times a day (TID) | ORAL | 99 refills | Status: DC | PRN

## 2023-08-31 NOTE — Telephone Encounter (Signed)
 Please offer her a virtual visit if she desires.

## 2023-08-31 NOTE — Telephone Encounter (Signed)
 Patient went to the ED Friday, 2/07. She thought she was having an anxiety attack. She states she has had anxiety for a while, but her husband passed away in his sleep about 2 weeks ago and she has been more stressed. She would like to know if Dr. Mallipeddi can review her ED notes.

## 2023-08-31 NOTE — Progress Notes (Signed)
 MyChart Video visit  Subjective: CC:ER follow up PCP: Sydney Guerin, Sydney [name] Sydney [name] is a 63 y.o. female. Patient provides verbal consent for consult held via video.  Due to COVID-19 pandemic this visit was conducted virtually. This visit type was conducted due to national recommendations for restrictions regarding the COVID-19 Pandemic (e.g. social distancing, sheltering in place) in an effort to limit this patient's exposure and mitigate transmission in our community. All issues noted in this document were discussed and addressed.  A physical exam was not performed with this format.   Location of patient: home Location of provider: [facility] Others present for call: grandchild  1. Afib/ grief New onset. Diagnosed 08/28/2023.  K noted to be 3.1. Repleted and discharged on eliquis  and potassium.  Continued on home Norvasc  and Metoprolol . No other antiarrythmics.  Has reached out to cardiology but has not heard back yet.  She reports improvement in heart palpitations. Still having some stress. Compliant with zoloft , has been using trazodone  faithfully at night for sleep. Admits eating has been erratic since spouse passed away.  She's been drinking coconut water to replete her potassium as well.  ROS: Per HPI  Allergies  Allergen Reactions   Bee Pollen Anaphylaxis and Swelling   Bee Venom Anaphylaxis and Swelling   Butorphanol Other (See Comments)    Not sure told by md she was allergic after surgery.    Meloxicam Anxiety    Mood disorder Altered her personality    Penicillins Anaphylaxis and Hives    Unable to Recall As child mom was told she is highly allergic Did it involve swelling of the face/tongue/throat, SOB, or low BP? Unknown Did it involve sudden or severe rash/hives, skin peeling, or any reaction on the inside of your mouth or nose? Unknown Did you need to seek medical attention at a hospital or doctor's office? Unknown When did it last happen?      childhood  allergy If all above answers are "NO", may proceed with cephalosporin use.    Prochlorperazine Edisylate Anaphylaxis   Sulfa Antibiotics Anaphylaxis    Unable to Recall   Tramadol Anxiety    Didn't like the way it made her feel    Rosuvastatin  Other (See Comments)    Per pt, keeps her awake when she takes rosuvastatin  daily so decreased to every other day    Clindamycin  Hives   Levaquin  Bede.Boone ] Itching   Topiramate Nausea And Vomiting   Doxycycline  Dermatitis, Hives, Itching, Photosensitivity and Rash   Past Medical History:  Diagnosis Date   Allergy    Anemia    history of   Anxiety    Arthritis    Asthma    Atherosclerosis    Depression    Family history of adverse reaction to anesthesia    pt's mother felt everything and couldn't speak during a gallbladder surgery   GERD (gastroesophageal reflux disease)    History of hiatal hernia    History of kidney stones    Hypertension    MI (myocardial infarction) (HCC)    Pneumonia    a few times   TIA (transient ischemic attack) 2012    Current Outpatient Medications:    albuterol  (VENTOLIN  HFA) 108 (90 Base) MCG/ACT inhaler, Inhale 2 puffs into the lungs every 6 (six) hours as needed for wheezing or shortness of breath., Disp: 18 g, Rfl: 0   amLODipine  (NORVASC ) 5 MG tablet, Take 1 tablet (5 mg total) by mouth daily., Disp: 90 tablet, Rfl: 3  apixaban  (ELIQUIS ) 5 MG TABS tablet, Take 1 tablet (5 mg total) by mouth 2 (two) times daily., Disp: 60 tablet, Rfl: 0   aspirin  EC 81 MG tablet, Take 1 tablet (81 mg total) by mouth daily. Swallow whole., Disp: 30 tablet, Rfl: 12   atorvastatin  (LIPITOR) 40 MG tablet, Take 1 tablet (40 mg total) by mouth daily., Disp: 90 tablet, Rfl: 3   azelastine  (ASTELIN ) 0.1 % nasal spray, Place 1 spray into both nostrils 2 (two) times daily., Disp: 30 mL, Rfl: 12   cetirizine (ZYRTEC) 10 MG tablet, Take 10 mg by mouth daily., Disp: , Rfl:    EPINEPHrine  (EPIPEN  2-PAK) 0.3 mg/0.3 mL IJ SOAJ  injection, Inject 0.3 mg into the muscle as needed for anaphylaxis., Disp: 2 each, Rfl: 0   fluticasone (FLONASE  SENSIMIST) 27.5 MCG/SPRAY nasal spray, Place 2 sprays into the nose daily., Disp: 10 g, Rfl: 12   HYDROcodone -acetaminophen  (NORCO) 5-325 MG tablet, Take 1 tablet by mouth every 6 (six) hours as needed for severe pain (pain score 7-10)., Disp: 30 tablet, Rfl: 0   metoprolol  succinate (TOPROL -XL) 25 MG 24 hr tablet, Take 1 tablet (25 mg total) by mouth daily., Disp: 90 tablet, Rfl: 3   nitroGLYCERIN  (NITROSTAT ) 0.4 MG SL tablet, Place 1 tablet (0.4 mg total) under the tongue every 5 (five) minutes as needed for chest pain. Then call 911 or go to ER, Disp: 50 tablet, Rfl: 0   potassium chloride  SA (KLOR-CON  M) 20 MEQ tablet, Take 1 tablet (20 mEq total) by mouth daily., Disp: 4 tablet, Rfl: 0   ranolazine  (RANEXA ) 500 MG 12 hr tablet, Take 1 tablet (500 mg total) by mouth 2 (two) times daily., Disp: 60 tablet, Rfl: 3   sertraline  (ZOLOFT ) 100 MG tablet, TAKE 1 AND 1/2 TABLETS(150 MG TOTAL) DAILY, Disp: 90 tablet, Rfl: 0   silodosin  (RAPAFLO ) 8 MG CAPS capsule, TAKE 1 CAPSULE (8 MG TOTAL) BY MOUTH DAILY AS NEEDED. FOR STONE SYMPTOMS., Disp: 30 capsule, Rfl: 0   solifenacin  (VESICARE ) 5 MG tablet, Take 1 tablet (5 mg total) by mouth daily., Disp: 30 tablet, Rfl: 11   ticagrelor  (BRILINTA ) 90 MG TABS tablet, Take 1 tablet (90 mg total) by mouth 2 (two) times daily., Disp: 180 tablet, Rfl: 1   traZODone  (DESYREL ) 100 MG tablet, TAKE 1 TO 2 TABLETS AT     BEDTIME AS NEEDED FOR SLEEP, Disp: 180 tablet, Rfl: 3  Gen: well appearing female, NAD Psych: mood stable, speech normal  DG Chest Portable 1 View Result Date: 08/28/2023 CLINICAL DATA:  sob EXAM: PORTABLE CHEST 1 VIEW COMPARISON:  Chest x-ray 02/02/2023, CT chest 02/02/2023 FINDINGS: The heart and mediastinal contours are unchanged. Atherosclerotic plaque No focal consolidation. Chronic coarsened interstitial markings with no overt pulmonary  edema. No pleural effusion. No pneumothorax. No acute osseous abnormality. IMPRESSION: 1. No active disease. 2.  Aortic Atherosclerosis (ICD10-I70.0). Electronically Signed   By: Sydney  Naveau M.D.   On: 08/28/2023 20:17   Results for orders placed or performed during the hospital encounter of 08/28/23 (from the past 72 hours)  Basic metabolic panel     Status: Abnormal   Collection Time: 08/28/23  7:49 PM  Result Value Ref Range   Sodium 137 135 - 145 mmol/L   Potassium 3.1 (L) 3.5 - 5.1 mmol/L   Chloride 104 98 - 111 mmol/L   CO2 23 22 - 32 mmol/L   Glucose, Bld 138 (H) 70 - 99 mg/dL    Comment: Glucose reference  range applies only to samples taken after fasting for at least 8 hours.   BUN 9 8 - 23 mg/dL   Creatinine, Ser 1.61 0.44 - 1.00 mg/dL   Calcium  9.1 8.9 - 10.3 mg/dL   GFR, Estimated >09 >60 mL/min    Comment: (NOTE) Calculated using the CKD-EPI Creatinine Equation (2021)    Anion gap 10 5 - 15    Comment: Performed at [facility], [address], [place], [place] [address]  Troponin I (High Sensitivity)     Status: None   Collection Time: 08/28/23  7:49 PM  Result Value Ref Range   Troponin I (High Sensitivity) 9 <18 ng/L    Comment: (NOTE) Elevated high sensitivity troponin I (hsTnI) values and significant  changes across serial measurements may suggest ACS but many other  chronic and acute conditions are known to elevate hsTnI results.  Refer to the "Links" section for chest pain algorithms and additional  guidance. Performed at [facility], [address], [place], [place] [address]   CBC     Status: None   Collection Time: 08/28/23  7:49 PM  Result Value Ref Range   WBC 8.6 4.0 - 10.5 K/uL   RBC 4.79 3.87 - 5.11 MIL/uL   Hemoglobin 14.8 12.0 - 15.0 g/dL   HCT 47.8 29.5 - 62.1 %   MCV 92.7 80.0 - 100.0 fL   MCH 30.9 26.0 - 34.0 pg   MCHC 33.3 30.0 - 36.0 g/dL   RDW 30.8 65.7 - 84.6 %   Platelets 254 150 - 400 K/uL   nRBC 0.0 0.0 - 0.2 %    Comment:  Performed at [facility], [address] [place], [place] [address]  Magnesium      Status: None   Collection Time: 08/28/23  7:49 PM  Result Value Ref Range   Magnesium  2.1 1.7 - 2.4 mg/dL    Comment: Performed at [facility], [address], [place], [place] [address]  Troponin I (High Sensitivity)     Status: Abnormal   Collection Time: 08/28/23 10:07 PM  Result Value Ref Range   Troponin I (High Sensitivity) 22 (H) <18 ng/L    Comment: (NOTE) Elevated high sensitivity troponin I (hsTnI) values and significant  changes across serial measurements may suggest ACS but many other  chronic and acute conditions are known to elevate hsTnI results.  Refer to the "Links" section for chest pain algorithms and additional  guidance. Performed at [facility], [address] [place], [place] [address]     Assessment/ Plan: 63 y.o. female   Grief reaction - Plan: hydrOXYzine  (VISTARIL ) 25 MG capsule  New onset a-fib (HCC) - Plan: apixaban  (ELIQUIS ) 5 MG TABS tablet  Hypokalemia - Plan: Basic metabolic panel  Continue Zoloft  but stop Trazodone  given additive risk of QTc prolongation.  Trial of Vistaril  for sleep/ panic instead.  Caution sedation/ dryness.  On Norco for chronic pain so will be avoiding benzodiazepines given risk.   Given new onset Afib, anticipate lifelong eliquis .  Will cc cardiology as FYI. Had Echo recently. Don't see any valvular abnormalities that would cause.  Plan for repeat BMP for potassium either Thursday/ Friday.  Start time: 5:02pm (text sent), 5:07pm (text sent); 5:12pm End time: 5:40pm  Total time spent on patient care (including video visit/ documentation): 30 minutes  Sydney Chiou Bambi Bonine, Sydney [name] [facility] [phone]

## 2023-08-31 NOTE — Telephone Encounter (Signed)
 Spoke with pt who states that she was seen in the ER on Friday with Hr of 145 and in a-fib. Pt would like Dr. Mallipeddi to review her chart and suggest the next step.

## 2023-09-01 ENCOUNTER — Telehealth: Payer: Self-pay

## 2023-09-01 NOTE — Telephone Encounter (Signed)
Patient notified and verbalized understanding. Patient stated that she will complete sleep study in 1 month and will call office if she continues to have palpitations. Patient had no questions or concerns at this time.

## 2023-09-01 NOTE — Telephone Encounter (Signed)
Copied from CRM [id number]. Topic: Clinical - Lab/Test Results >> Sep 01, 2023 12:55 PM Carlatta H wrote: Reason for CRM: Please call patient back letting her know if she needs to fast//Also she would like to ask the nurse a question

## 2023-09-02 NOTE — Telephone Encounter (Signed)
Pt aware to try taking 3 capsules of Vistaril, verbalizes understanding and will try this tonight.

## 2023-09-02 NOTE — Telephone Encounter (Signed)
Change to Vistaril - has spent last 2 nights wide awake, not helping, she is calmer which is nice. Last night she took 2 and got about an hours worth of sleep Please advise and sign order for BMP for tomorrows lab appt.

## 2023-09-02 NOTE — Telephone Encounter (Signed)
BMP was already preordered during video visit. Should be available May increase vistaril to 3 capsules if needed.

## 2023-09-03 ENCOUNTER — Other Ambulatory Visit: Payer: Medicare (Managed Care)

## 2023-09-03 DIAGNOSIS — E876 Hypokalemia: Secondary | ICD-10-CM

## 2023-09-04 ENCOUNTER — Encounter: Payer: Self-pay | Admitting: Family Medicine

## 2023-09-04 LAB — BASIC METABOLIC PANEL
BUN/Creatinine Ratio: 10 — ABNORMAL LOW (ref 12–28)
BUN: 8 mg/dL (ref 8–27)
CO2: 22 mmol/L (ref 20–29)
Calcium: 9.7 mg/dL (ref 8.7–10.3)
Chloride: 102 mmol/L (ref 96–106)
Creatinine, Ser: 0.78 mg/dL (ref 0.57–1.00)
Glucose: 116 mg/dL — ABNORMAL HIGH (ref 70–99)
Potassium: 4.3 mmol/L (ref 3.5–5.2)
Sodium: 138 mmol/L (ref 134–144)
eGFR: 85 mL/min/{1.73_m2} (ref >59)

## 2023-09-07 ENCOUNTER — Encounter (INDEPENDENT_AMBULATORY_CARE_PROVIDER_SITE_OTHER): Payer: Self-pay | Admitting: Family Medicine

## 2023-09-07 ENCOUNTER — Encounter: Payer: Self-pay | Admitting: Nurse Practitioner

## 2023-09-07 ENCOUNTER — Ambulatory Visit: Payer: Medicare (Managed Care) | Attending: Nurse Practitioner | Admitting: Nurse Practitioner

## 2023-09-07 VITALS — BP 132/78 | HR 81 | Ht 65.0 in | Wt 206.2 lb

## 2023-09-07 DIAGNOSIS — E785 Hyperlipidemia, unspecified: Secondary | ICD-10-CM

## 2023-09-07 DIAGNOSIS — I2510 Atherosclerotic heart disease of native coronary artery without angina pectoris: Secondary | ICD-10-CM

## 2023-09-07 DIAGNOSIS — I4892 Unspecified atrial flutter: Secondary | ICD-10-CM | POA: Diagnosis not present

## 2023-09-07 DIAGNOSIS — J039 Acute tonsillitis, unspecified: Secondary | ICD-10-CM

## 2023-09-07 DIAGNOSIS — I10 Essential (primary) hypertension: Secondary | ICD-10-CM | POA: Diagnosis not present

## 2023-09-07 NOTE — Patient Instructions (Addendum)
Medication Instructions:   Continue all current medications.   Labwork:  none  Testing/Procedures:  Your physician has recommended that you wear a 14 day event monitor. Event monitors are medical devices that record the heart's electrical activity. Doctors most often [place] these monitors to diagnose arrhythmias. Arrhythmias are problems with the speed or rhythm of the heartbeat. The monitor is a small, portable device. You can wear one while you do your normal daily activities. This is usually used to diagnose what is causing palpitations/syncope (passing out). Office will contact with results via phone, letter or mychart.     Follow-Up:  6-8 weeks   Any Other Special Instructions Will Be Listed Below (If Applicable).   If you need a refill on your cardiac medications before your next appointment, please call your pharmacy.

## 2023-09-07 NOTE — Progress Notes (Signed)
Cardiology Office Note:  .   Date:  09/07/2023  ID:  Sydney [name], DOB [birth year], MRN [id number] PCP: Sydney Ip, DO  [facility] Providers Cardiologist:  Sydney Bicker, MD    History of Present Illness: .   Sydney [name] is a 63 y.o. female with a PMH of A-flutter with RVR, CAD, hypertension, hyperlipidemia, history of TIA in 2012, GERD, and anxiety, who presents today for regular follow-up.  Previous cardiovascular history of NSTEMI in July 2024.  Received PCI of left circumflex.  Recommended to continue DAPT x 1 year  Last seen by Dr. Jenene [name] on June 04, 2023.  At that time, patient reported having chronic chest pains, typically triggered by stress.  Then, she was undergoing significant stress.  Noted intermittent shortness of breath that was not bothersome.  Was started on low-dose Ranexa.  Echocardiogram was arranged and was benign.  ED visit on 08/28/2023 for "A-fib", however initial EKG revealed A-flutter with 2:1 A-V conduction. Presented with CP and tachycardia. Had been feeling bad for the past several days, was after she found her husband passed away and was found dead in bed. Was under stress at the time. She was tx with Cardizem and Potassium. Did convert back to NSR.   Today she presents for regular follow-up with her granddaughter.  She states she is overall doing well. Denies any recent palpitations or tachycardia but does admit to sensation of heart pounding. Denies any abnormal heart rhythm. Denies any chest pain, shortness of breath, palpitations, syncope, presyncope, dizziness, orthopnea, PND, swelling or significant weight changes, acute bleeding, or claudication. Says she is scheduled for future sleep study soon.   ROS: Negative. See HPI.  SH: Coping well after her husband's passing. 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   Studies Reviewed: Marland Kitchen    EKG:  EKG Interpretation Date/Time:  Monday September 07 2023 14:45:39 EST Ventricular Rate:  71 PR  Interval:  130 QRS Duration:  94 QT Interval:  394 QTC Calculation: 428 R Axis:   52  Text Interpretation: Normal sinus rhythm Normal ECG When compared with ECG of 28-Aug-2023 20:54, PREVIOUS ECG IS PRESENT Confirmed by Sharlene Dory [phone]) on 09/07/2023 2:55:11 PM   Echo 06/2023:    1. Left ventricular ejection fraction, by estimation, is 60 to 65%. The  left ventricle has normal function. The left ventricle has no regional  wall motion abnormalities. Left ventricular diastolic parameters were  normal.   2. Right ventricular systolic function is normal. The right ventricular  size is normal. There is mildly elevated pulmonary artery systolic  pressure. The estimated right ventricular systolic pressure is 36.3 mmHg.   3. The mitral valve is normal in structure. No evidence of mitral valve  regurgitation. No evidence of mitral stenosis.   4. The aortic valve is tricuspid. Aortic valve regurgitation is not  visualized. No aortic stenosis is present.   5. The inferior vena cava is dilated in size with >50% respiratory  variability, suggesting right atrial pressure of 8 mmHg.   Comparison(s): No prior Echocardiogram.  LHC 01/2023:    CULPRIT LESION SEGMENT: Mid Cx to Dist Cx lesion is 60% stenosed.  Dist Cx lesion is 95% stenosed.   A drug-eluting stent was successfully placed covering both lesions, using a SYNERGY XD [id number] -> deployed to 2.45 mm with stent balloon.  Post intervention, there is a 0% residual stenosis for both lesions.TIMI-3 flow pre and post   -----------------------------------------------------------------   Post intervention, there is a 0% residual stenosis.  There is hyperdynamic left ventricular systolic function.  The left ventricular ejection fraction is greater than 65% by visual estimate.   LV end diastolic pressure is normal.   There is no aortic valve stenosis.   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 TIMI-3 flow pre and post. Otherwise large caliber codominant RCA with large PDA and large RV marginal branch with minimal disease, and very large caliber LAD with equally large major diagonal branch also with minimal disease in the LAD. Hyperdynamic LV with EF > 65%, no RWMA. LVEDP 7 mmHg.  Patient has chronic chest discomfort which was not similar to the symptoms noted when she had pretty classic anginal pain in the jaw and throat/neck with PTCA and stent placement.  Chronic pain is a 2/10, anginal pain is a 0/10.     RECOMMENDATIONS In the absence of any other complications or medical issues, we expect the patient to be ready for discharge from an interventional cardiology perspective on 02/04/2023. Recommend uninterrupted dual antiplatelet therapy with Aspirin 81mg  daily and Ticagrelor 90mg  twice daily for a minimum of 12 months (ACS-Class I recommendation). Given the difficulty of stenting this vessel, would recommend at least another year of antiplatelet agent monotherapy with either maintenance dose Brilinta 60 mg twice daily or Plavix 75 mg daily that can be interrupted.   Physical Exam:   VS:  BP 132/78   Pulse 81   Ht 5\' 5"  (1.651 m)   Wt 206 lb 3.2 oz (93.5 kg)   SpO2 97%   BMI 34.31 kg/m    Wt Readings from Last 3 Encounters:  09/07/23 206 lb 3.2 oz (93.5 kg)  08/28/23 209 lb (94.8 kg)  08/07/23 211 lb 12.8 oz (96.1 kg)    GEN: Obese, 63 y.o. female in no acute distress NECK: No JVD; No carotid bruits CARDIAC: S1/S2, RRR, no murmurs, rubs, gallops RESPIRATORY:  Clear to auscultation without rales, wheezing or rhonchi  ABDOMEN: Soft, non-tender, non-distended EXTREMITIES:  No edema; No deformity   ASSESSMENT AND PLAN: .    A-flutter Recent new onset shortly after her husband's unexpected passing. Possibly stress induced. Denies  any palpitations or tachycardia but admits to sensation of pounding. Will arrange 2 week Zio XT monitor for further evaluation.  Tolerating current medications well. Continue Toprol XL, HR well controlled and Eliquis 5 mg BID. Denies any bleeding issues and on appropriate dosage. Discussed avoiding triggers to A-flutter. Scheduled for upcoming sleep study.   CAD Underwent LHC in July 2024 d/t NSTEMI - see results above. Received PCI of left circumflex.  Recommended to continue DAPT x 1 year. Denies any chest pain. Continue Aspirin, Brilinta, atorvastatin, Toprol XL, NTG PRN, and Ranexa. Heart healthy diet and regular cardiovascular exercise encouraged. Care and ED precautions discussed.   HTN BP borderline elevated. Discussed to monitor BP at home at least 2 hours after medications and sitting for 5-10 minutes. Continue current medication regimen. Will continue to monitor. SBP goal < 130. If BP not improved by next office visit, plan to consider increasing Amlodipine. Heart healthy diet and regular cardiovascular exercise encouraged.   HLD Most recent LDL 64 01/2023. She has previous order for FLP in August 2024. Plan to address obtaining this labwork at next office visit. No medication changes at this time. Heart healthy diet and  regular cardiovascular exercise encouraged.    Dispo: Follow-up with me/APP in 6-8 weeks or sooner if anything changes.   Signed, Sharlene Dory, NP

## 2023-09-08 MED ORDER — NYSTATIN 100000 UNIT/ML MT SUSP: 5.0000 mL | Freq: Four times a day (QID) | OROMUCOSAL | 0 refills | Status: DC

## 2023-09-08 NOTE — Telephone Encounter (Signed)

## 2023-09-10 ENCOUNTER — Ambulatory Visit: Payer: Medicare (Managed Care) | Attending: Nurse Practitioner

## 2023-09-10 ENCOUNTER — Other Ambulatory Visit: Payer: Self-pay | Admitting: Nurse Practitioner

## 2023-09-10 DIAGNOSIS — I4892 Unspecified atrial flutter: Secondary | ICD-10-CM

## 2023-09-11 ENCOUNTER — Telehealth: Payer: Self-pay | Admitting: Nurse Practitioner

## 2023-09-11 NOTE — Telephone Encounter (Signed)
Checking percert on the following patient for   2 week Zio XT monitor

## 2023-09-13 DIAGNOSIS — I4892 Unspecified atrial flutter: Secondary | ICD-10-CM | POA: Diagnosis not present

## 2023-09-20 ENCOUNTER — Encounter: Payer: Self-pay | Admitting: Family Medicine

## 2023-09-21 ENCOUNTER — Encounter: Payer: Self-pay | Admitting: Nurse Practitioner

## 2023-09-21 ENCOUNTER — Ambulatory Visit (INDEPENDENT_AMBULATORY_CARE_PROVIDER_SITE_OTHER): Payer: Medicare (Managed Care) | Admitting: Nurse Practitioner

## 2023-09-21 VITALS — BP 136/87 | HR 65 | Temp 97.8°F | Ht 65.0 in | Wt 207.0 lb

## 2023-09-21 DIAGNOSIS — S01311A Laceration without foreign body of right ear, initial encounter: Secondary | ICD-10-CM

## 2023-09-21 MED ORDER — NEOMYCIN-POLYMYXIN-HC 3.5-10000-1 OT SOLN: 4.0000 [drp] | Freq: Four times a day (QID) | OTIC | 0 refills | Status: DC

## 2023-09-21 NOTE — Progress Notes (Signed)
 Subjective:    Patient ID: Sydney [name], female    DOB: 07/20/1961, 63 y.o.   MRN: [id number]   Chief Complaint: Ear bleeding (Right ear on Friday and Saturday)   HPI Patient in c/o blood coming out of right ear. Slight pain that started a day or so prior to drainage. She has had ear infections her whole ife.   Patient Active Problem List   Diagnosis Date Noted   CAD (coronary artery disease) 06/04/2023   HLD (hyperlipidemia) 06/04/2023   Long term (current) use of antithrombotics/antiplatelets 06/04/2023   S/P coronary angioplasty 06/04/2023   Nasal congestion with rhinorrhea 04/29/2023   Body aches 04/29/2023   NSTEMI (non-ST elevated myocardial infarction) (HCC) 02/02/2023   Nicotine abuse 02/02/2023   Weakness of left quadriceps muscle 11/06/2022   Facet degeneration of lumbar region 02/13/2022   Peripheral vascular disease with stasis dermatitis 09/03/2021   Urine frequency 05/10/2021   History of bilateral hip arthroplasty 01/03/2021   Trochanteric bursitis, left hip 09/13/2020   Closed fracture of multiple pubic rami, left, sequela 08/16/2020   H/O adenomatous polyp of colon 05/23/2020   FH: colon cancer in relative diagnosed at >68 years old 05/23/2020   Impingement syndrome of right shoulder 05/03/2020   H/O total hip arthroplasty 12/06/2019   DDD (degenerative disc disease), lumbosacral 07/01/2018   Venous insufficiency of both lower extremities 11/17/2016   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 level with radiculopathy 08/18/2016   Chronic pain syndrome 10/24/2015   Dysfunction of both eustachian tubes 07/26/2014   Impaired fasting glucose 04/05/2014   History of bladder surgery 04/03/2014   History of hysterectomy 04/03/2014   Urge incontinence 04/03/2014   Asthma 07/28/2013   Nontoxic uninodular goiter 07/28/2013   Solitary pulmonary nodule 07/28/2013   Lumbar radiculopathy 04/20/2013   Insomnia 03/22/2013   Essential hypertension 08/18/2011   Moderate episode  of recurrent major depressive disorder (HCC) 04/03/2011   Other B-complex deficiencies 08/16/2010   Morbid obesity due to excess calories (HCC) 05/17/2010   Family history of cerebrovascular accident (CVA) 04/17/2010   Family history of malignant neoplasm of gastrointestinal tract 04/17/2010   Family history of other cardiovascular diseases(V17.49) 04/17/2010       Review of Systems  HENT:  Positive for ear discharge and ear pain.        Objective:   Physical Exam Constitutional:      Appearance: Normal appearance.  HENT:     Right Ear: Laceration (right ear canal) present.     Left Ear: Tympanic membrane normal. Tenderness present.  Cardiovascular:     Rate and Rhythm: Normal rate and regular rhythm.     Heart sounds: Normal heart sounds.  Pulmonary:     Effort: Pulmonary effort is normal.     Breath sounds: Normal breath sounds.  Skin:    General: Skin is warm.  Neurological:     General: No focal deficit present.     Mental Status: She is alert and oriented to person, place, and time.  Psychiatric:        Mood and Affect: Mood normal.        Behavior: Behavior normal.    BP 136/87   Pulse 65   Temp 97.8 F (36.6 C) (Temporal)   Ht 5\' 5"  (1.651 m)   Wt 207 lb (93.9 kg)   SpO2 95%   BMI 34.45 kg/m         Assessment & Plan:  Sydney [name] in today with chief complaint of  Ear bleeding (Right ear on Friday and Saturday)   1. Laceration of right ear canal, initial encounter (Primary) Avoid using q tips in ear Meds ordered this encounter  Medications   neomycin-polymyxin-hydrocortisone (CORTISPORIN) OTIC solution    Sig: Place 4 drops into the right ear 4 (four) times daily.    Dispense:  10 mL    Refill:  0    Supervising Provider:   Arville Care A [[id number]]       The above assessment and management plan was discussed with the patient. The patient verbalized understanding of and has agreed to the management plan. Patient is aware to call the  clinic if symptoms persist or worsen. Patient is aware when to return to the clinic for a follow-up visit. Patient educated on when it is appropriate to go to the emergency department.   [name] Daphine Deutscher, FNP

## 2023-09-21 NOTE — Telephone Encounter (Signed)
 Kelci, offer her DOD with MM to look. Sounds like a ruptured ear drum

## 2023-09-21 NOTE — Patient Instructions (Signed)
Otitis Externa  Otitis externa is an infection of the outer ear canal. The outer ear canal is the area between the outside of the ear and the eardrum. Otitis externa is sometimes called swimmer's ear. What are the causes? Common causes of this condition include: Swimming in dirty water. Moisture in the ear. An injury to the inside of the ear. An object stuck in the ear. A cut or scrape on the outside of the ear or in the ear canal. What increases the risk? You are more likely to develop this condition if you go swimming often. What are the signs or symptoms? The first symptom of this condition is often itching in the ear. Later symptoms of the condition include: Swelling of the ear. Redness in the ear. Ear pain. The pain may get worse when you pull on your ear. Pus coming from the ear. How is this diagnosed? This condition may be diagnosed by examining the ear and testing fluid from the ear for bacteria and funguses. How is this treated? This condition may be treated with: Antibiotic ear drops. These are often given for 10-14 days. Medicines to reduce itching and swelling. Follow these instructions at home: If you were prescribed antibiotic ear drops, use them as told by your health care provider. Do not stop using the antibiotic even if you start to feel better. Take over-the-counter and prescription medicines only as told by your health care provider. Avoid getting water in your ears as told by your health care provider. This may include avoiding swimming or water sports for a few days. Keep all follow-up visits. This is important. How is this prevented? Keep your ears dry. Use the corner of a towel to dry your ears after you swim or bathe. Avoid scratching or putting things in your ear. Doing these things can damage the ear canal or remove the protective wax that lines it, which makes it easier for bacteria and funguses to grow. Avoid swimming in lakes, polluted water, or swimming  pools that may not have enough chlorine. Contact a health care provider if: You have a fever. Your ear is still red, swollen, painful, or draining pus after 3 days. Your redness, swelling, or pain gets worse. You have a severe headache. Get help right away if: You have redness, swelling, and pain or tenderness in the area behind your ear. Summary Otitis externa is an infection of the outer ear canal. Common causes include swimming in dirty water, moisture in the ear, or a cut or scrape in the ear. Symptoms include pain, redness, and swelling of the ear canal. If you were prescribed antibiotic ear drops, use them as told by your health care provider. Do not stop using the antibiotic even if you start to feel better. This information is not intended to replace advice given to you by your health care provider. Make sure you discuss any questions you have with your health care provider. Document Revised: 09/19/2020 Document Reviewed: 09/19/2020 Elsevier Patient Education  2024 [employer] Inc.  

## 2023-09-23 ENCOUNTER — Other Ambulatory Visit: Payer: Self-pay | Admitting: Family Medicine

## 2023-09-23 DIAGNOSIS — F32A Depression, unspecified: Secondary | ICD-10-CM

## 2023-09-25 MED ORDER — SERTRALINE HCL 100 MG PO TABS: 100.0000 mg | ORAL_TABLET | Freq: Every day | ORAL | 3 refills | Status: DC

## 2023-09-26 ENCOUNTER — Other Ambulatory Visit: Payer: Self-pay | Admitting: Internal Medicine

## 2023-09-30 ENCOUNTER — Other Ambulatory Visit: Payer: Self-pay | Admitting: Internal Medicine

## 2023-10-08 ENCOUNTER — Encounter: Payer: Self-pay | Admitting: Family Medicine

## 2023-10-08 ENCOUNTER — Ambulatory Visit: Payer: Medicare (Managed Care) | Admitting: Nurse Practitioner

## 2023-10-08 DIAGNOSIS — I4892 Unspecified atrial flutter: Secondary | ICD-10-CM | POA: Diagnosis not present

## 2023-10-08 DIAGNOSIS — I4719 Other supraventricular tachycardia: Secondary | ICD-10-CM | POA: Diagnosis not present

## 2023-10-09 ENCOUNTER — Encounter: Payer: Self-pay | Admitting: Nurse Practitioner

## 2023-10-09 ENCOUNTER — Ambulatory Visit: Payer: Medicare (Managed Care) | Admitting: Nurse Practitioner

## 2023-10-09 VITALS — BP 109/66 | HR 61 | Temp 98.3°F | Ht 65.0 in | Wt 205.6 lb

## 2023-10-09 DIAGNOSIS — J40 Bronchitis, not specified as acute or chronic: Secondary | ICD-10-CM | POA: Diagnosis not present

## 2023-10-09 MED ORDER — BENZONATATE 100 MG PO CAPS: 100.0000 mg | ORAL_CAPSULE | Freq: Three times a day (TID) | ORAL | 0 refills | Status: DC | PRN

## 2023-10-09 MED ORDER — AZITHROMYCIN 250 MG PO TABS: ORAL_TABLET | ORAL | 0 refills | Status: DC

## 2023-10-09 MED ORDER — PREDNISONE 20 MG PO TABS
40.0000 mg | ORAL_TABLET | Freq: Every day | ORAL | 0 refills | Status: AC
Start: 2023-10-09 — End: 2023-10-14

## 2023-10-09 NOTE — Patient Instructions (Signed)

## 2023-10-09 NOTE — Progress Notes (Signed)
 Subjective:    Patient ID: Sydney [name], female    DOB: [birth year], 63 y.o.   MRN: [id number]   Chief Complaint: Cough   Cough This is a new problem. The current episode started in the past 7 days. The problem has been waxing and waning. The cough is Non-productive. Associated symptoms include rhinorrhea. Pertinent negatives include no chills, ear pain, fever, headaches, nasal congestion, sore throat or shortness of breath. Nothing aggravates the symptoms. She has tried OTC inhaler (zyrtec) for the symptoms. The treatment provided mild relief. Her past medical history is significant for bronchitis and pneumonia.    Patient Active Problem List   Diagnosis Date Noted   CAD (coronary artery disease) 06/04/2023   HLD (hyperlipidemia) 06/04/2023   Long term (current) use of antithrombotics/antiplatelets 06/04/2023   S/P coronary angioplasty 06/04/2023   Nasal congestion with rhinorrhea 04/29/2023   Body aches 04/29/2023   NSTEMI (non-ST elevated myocardial infarction) (HCC) 02/02/2023   Nicotine abuse 02/02/2023   Weakness of left quadriceps muscle 11/06/2022   Facet degeneration of lumbar region 02/13/2022   Peripheral vascular disease with stasis dermatitis 09/03/2021   Urine frequency 05/10/2021   History of bilateral hip arthroplasty 01/03/2021   Trochanteric bursitis, left hip 09/13/2020   Closed fracture of multiple pubic rami, left, sequela 08/16/2020   H/O adenomatous polyp of colon 05/23/2020   FH: colon cancer in relative diagnosed at >27 years old 05/23/2020   Impingement syndrome of right shoulder 05/03/2020   H/O total hip arthroplasty 12/06/2019   DDD (degenerative disc disease), lumbosacral 07/01/2018   Venous insufficiency of both lower extremities 11/17/2016   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 level with radiculopathy 08/18/2016   Chronic pain syndrome 10/24/2015   Dysfunction of both eustachian tubes 07/26/2014   Impaired fasting glucose 04/05/2014   History of  bladder surgery 04/03/2014   History of hysterectomy 04/03/2014   Urge incontinence 04/03/2014   Asthma 07/28/2013   Nontoxic uninodular goiter 07/28/2013   Solitary pulmonary nodule 07/28/2013   Lumbar radiculopathy 04/20/2013   Insomnia 03/22/2013   Essential hypertension 08/18/2011   Moderate episode of recurrent major depressive disorder (HCC) 04/03/2011   Other B-complex deficiencies 08/16/2010   Morbid obesity due to excess calories (HCC) 05/17/2010   Family history of cerebrovascular accident (CVA) 04/17/2010   Family history of malignant neoplasm of gastrointestinal tract 04/17/2010   Family history of other cardiovascular diseases(V17.49) 04/17/2010       Review of Systems  Constitutional:  Negative for chills and fever.  HENT:  Positive for rhinorrhea. Negative for ear pain and sore throat.   Respiratory:  Positive for cough. Negative for shortness of breath.   Neurological:  Negative for headaches.       Objective:   Physical Exam Constitutional:      Appearance: Normal appearance. She is obese.  Cardiovascular:     Rate and Rhythm: Normal rate and regular rhythm.     Heart sounds: Normal heart sounds.  Pulmonary:     Breath sounds: Wheezing present. No rhonchi.  Skin:    General: Skin is warm.  Neurological:     General: No focal deficit present.     Mental Status: She is alert and oriented to person, place, and time.  Psychiatric:        Mood and Affect: Mood normal.   BP 109/66   Pulse 61   Temp 98.3 F (36.8 C) (Temporal)   Ht 5\' 5"  (1.651 m)   Wt 205 lb 9.6 oz (  93.3 kg)   SpO2 97%   BMI 34.21 kg/m          Assessment & Plan:   Sydney [name] in today with chief complaint of Cough   1. Bronchitis (Primary) 1. Take meds as prescribed 2. Use a cool mist humidifier especially during the winter months and when heat has been humid. 3. Use saline nose sprays frequently 4. Saline irrigations of the nose can be very helpful if done  frequently.  * 4X daily for 1 week*  * Use of a nettie pot can be helpful with this. Follow directions with this* 5. Drink plenty of fluids 6. Keep thermostat turn down low 7.For any cough or congestion- tessalon perles 8. For fever or aces or pains- take tylenol or ibuprofen appropriate for age and weight.  * for fevers greater than 101 orally you may alternate ibuprofen and tylenol every  3 hours.    - azithromycin (ZITHROMAX Z-PAK) 250 MG tablet; As directed  Dispense: 6 tablet; Refill: 0 - benzonatate (TESSALON PERLES) 100 MG capsule; Take 1 capsule (100 mg total) by mouth 3 (three) times daily as needed for cough.  Dispense: 20 capsule; Refill: 0 - predniSONE (DELTASONE) 20 MG tablet; Take 2 tablets (40 mg total) by mouth daily with breakfast for 5 days. 2 po daily for 5 days  Dispense: 10 tablet; Refill: 0    The above assessment and management plan was discussed with the patient. The patient verbalized understanding of and has agreed to the management plan. Patient is aware to call the clinic if symptoms persist or worsen. Patient is aware when to return to the clinic for a follow-up visit. Patient educated on when it is appropriate to go to the emergency department.   [name] Daphine Deutscher, FNP

## 2023-10-19 ENCOUNTER — Encounter: Payer: Self-pay | Admitting: Family Medicine

## 2023-10-19 DIAGNOSIS — F4320 Adjustment disorder, unspecified: Secondary | ICD-10-CM

## 2023-10-19 MED ORDER — HYDROXYZINE PAMOATE 100 MG PO CAPS: 100.0000 mg | ORAL_CAPSULE | Freq: Every evening | ORAL | 0 refills | Status: DC | PRN

## 2023-10-23 ENCOUNTER — Ambulatory Visit: Payer: Medicare (Managed Care) | Attending: Nurse Practitioner | Admitting: Nurse Practitioner

## 2023-10-23 ENCOUNTER — Encounter: Payer: Self-pay | Admitting: Nurse Practitioner

## 2023-10-23 VITALS — BP 134/80 | HR 66 | Ht 65.5 in | Wt 206.0 lb

## 2023-10-23 DIAGNOSIS — T148XXD Other injury of unspecified body region, subsequent encounter: Secondary | ICD-10-CM | POA: Diagnosis not present

## 2023-10-23 DIAGNOSIS — F439 Reaction to severe stress, unspecified: Secondary | ICD-10-CM | POA: Diagnosis not present

## 2023-10-23 DIAGNOSIS — I2510 Atherosclerotic heart disease of native coronary artery without angina pectoris: Secondary | ICD-10-CM | POA: Diagnosis not present

## 2023-10-23 DIAGNOSIS — I10 Essential (primary) hypertension: Secondary | ICD-10-CM

## 2023-10-23 DIAGNOSIS — T148XXA Other injury of unspecified body region, initial encounter: Secondary | ICD-10-CM

## 2023-10-23 DIAGNOSIS — E785 Hyperlipidemia, unspecified: Secondary | ICD-10-CM

## 2023-10-23 DIAGNOSIS — D699 Hemorrhagic condition, unspecified: Secondary | ICD-10-CM

## 2023-10-23 DIAGNOSIS — I4892 Unspecified atrial flutter: Secondary | ICD-10-CM

## 2023-10-23 MED ORDER — CLOPIDOGREL BISULFATE 75 MG PO TABS: 75.0000 mg | ORAL_TABLET | Freq: Every day | ORAL | 0 refills | Status: AC

## 2023-10-23 NOTE — Progress Notes (Signed)
 Cardiology Office Note:  .   Date:  10/23/2023  ID:  Sydney [name], DOB 09-Jan-1961, MRN [id number] PCP: Sydney Ip, DO  [facility] Providers Cardiologist:  Sydney Bicker, MD    History of Present Illness: .   Sydney [name] is a 63 y.o. female with a PMH of A-flutter with RVR, CAD, hypertension, hyperlipidemia, history of TIA in 2012, GERD, and anxiety, who presents today for regular follow-up.  Previous cardiovascular history of NSTEMI in July 2024.  Received PCI of left circumflex.  Recommended to continue DAPT x 1 year  Last seen by Sydney [name] on June 04, 2023.  At that time, patient reported having chronic chest pains, typically triggered by stress.  Then, she was undergoing significant stress.  Noted intermittent shortness of breath that was not bothersome.  Was started on low-dose Ranexa.  Echocardiogram was arranged and was benign.  ED visit on 08/28/2023 for "A-fib", however initial EKG revealed A-flutter with 2:1 A-V conduction. Presented with CP and tachycardia. Had been feeling bad for the past several days, was after she found her husband passed away and was found dead in bed. Was under stress at the time. She was tx with Cardizem and Potassium. Did convert back to NSR.   09/07/2023 - Today she presents for regular follow-up with her granddaughter.  She states she is overall doing well. Denies any recent palpitations or tachycardia but does admit to sensation of heart pounding. Denies any abnormal heart rhythm. Denies any chest pain, shortness of breath, palpitations, syncope, presyncope, dizziness, orthopnea, PND, swelling or significant weight changes, acute bleeding, or claudication. Says she is scheduled for future sleep study soon.   10/23/2023 - Presents today for follow-up. Overall doing well. Wonders if her previous episodes of palpitations were due to stress. Admits to stress after husband suddenly passed away. Does admit to easy bruising/bleeding. Had  a random episode of bleeding from right ear, did eventually resolve. Denies any recurrences. Denies any chest pain, shortness of breath, palpitations, syncope, presyncope, dizziness, orthopnea, PND, swelling or significant weight changes, or claudication.  ROS: Negative. See HPI.  SH: Coping well after her husband's passing. 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   Studies Reviewed: Marland Kitchen    EKG: EKG is not ordered today.   Cardiac monitor 09/2023:    Patch wear time was for 13 days and 20 hours.   Normal sinus rhythm predominantly ranging from 45 to 120 bpm with an average HR of 67 bpm.   4 runs of nonsustained SVT occurred, fastest interval lasting 4 beats and the longest interval lasting 5 beats.   No evidence of ventricular arrhythmias, high-grade AV block or pauses.   <1% PAC burden and <1% PVC burden.   Patient triggered events correlated with NSR, 53 to 71 bpm.  Echo 06/2023:    1. Left ventricular ejection fraction, by estimation, is 60 to 65%. The  left ventricle has normal function. The left ventricle has no regional  wall motion abnormalities. Left ventricular diastolic parameters were  normal.   2. Right ventricular systolic function is normal. The right ventricular  size is normal. There is mildly elevated pulmonary artery systolic  pressure. The estimated right ventricular systolic pressure is 36.3 mmHg.   3. The mitral valve is normal in structure. No evidence of mitral valve  regurgitation. No evidence of mitral stenosis.   4. The aortic valve is tricuspid. Aortic valve regurgitation is not  visualized. No aortic stenosis is present.   5. The inferior vena  cava is dilated in size with >50% respiratory  variability, suggesting right atrial pressure of 8 mmHg.   Comparison(s): No prior Echocardiogram.  LHC 01/2023:    CULPRIT LESION SEGMENT: Mid Cx to Dist Cx lesion is 60% stenosed.  Dist Cx lesion is 95% stenosed.   A drug-eluting stent was successfully placed covering both  lesions, using a SYNERGY XD 2.25X16 -> deployed to 2.45 mm with stent balloon.  Post intervention, there is a 0% residual stenosis for both lesions.TIMI-3 flow pre and post   -----------------------------------------------------------------   Post intervention, there is a 0% residual stenosis.   There is hyperdynamic left ventricular systolic function.  The left ventricular ejection fraction is greater than 65% by visual estimate.   LV end diastolic pressure is normal.   There is no aortic valve stenosis.   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 TIMI-3 flow pre and post. Otherwise large caliber codominant RCA with large PDA and large RV marginal branch with minimal disease, and very large caliber LAD with equally large major diagonal branch also with minimal disease in the LAD. Hyperdynamic LV with EF > 65%, no RWMA. LVEDP 7 mmHg.  Patient has chronic chest discomfort which was not similar to the symptoms noted when she had pretty classic anginal pain in the jaw and throat/neck with PTCA and stent placement.  Chronic pain is a 2/10, anginal pain is a 0/10.     RECOMMENDATIONS In the absence of any other complications or medical issues, we expect the patient to be ready for discharge from an interventional cardiology perspective on 02/04/2023. Recommend uninterrupted dual antiplatelet therapy with Aspirin 81mg  daily and Ticagrelor 90mg  twice daily for a minimum of 12 months (ACS-Class I recommendation). Given the difficulty of stenting this vessel, would recommend at least another year of antiplatelet agent monotherapy with either maintenance dose Brilinta 60 mg twice daily or Plavix 75 mg daily that can be interrupted.   Physical Exam:   VS:  BP 134/80 (BP Location: Left Arm, Cuff Size: Large)   Pulse  66   Ht 5' 5.5" (1.664 m)   Wt 206 lb (93.4 kg)   SpO2 95%   BMI 33.76 kg/m    Wt Readings from Last 3 Encounters:  10/23/23 206 lb (93.4 kg)  10/09/23 205 lb 9.6 oz (93.3 kg)  09/21/23 207 lb (93.9 kg)    GEN: Obese, 63 y.o. female in no acute distress NECK: No JVD; No carotid bruits CARDIAC: S1/S2, RRR, no murmurs, rubs, gallops RESPIRATORY:  Clear to auscultation without rales, wheezing or rhonchi  ABDOMEN: Soft, non-tender, non-distended EXTREMITIES:  No edema; No deformity, does have some scattered/generalized bruising to bilateral forearms   ASSESSMENT AND PLAN: .    Paroxysmal A-flutter Recent monitor did not show any evidence of atrial arrhthymias.  Most likely past episode of a-flutter was possibly stress induced. Denies any palpitations or tachycardia. Tolerating current medications well. Consulted attending cardiologist, Sydney [name] via secure chat regarding her medication therapy as she is on Aspirin, Brilitna, and Eliquis. I informed her about patient's report of easy bruising/bleeding - see HPI. Sydney [name] stated the following, "I would stop aspirin. Switch brillinta to Plavix 75 OD till 01/2024 along with Eliquis. Then stop plavix and continue Eliquis after July 2025." These orders were given to CMA, pt updated  regarding plan of care and verbalized understanding. Continue Toprol XL, HR well controlled and Eliquis 5 mg BID. Discussed avoiding triggers to A-flutter. Will obtain labs as mentioned below.   CAD Underwent LHC in July 2024 d/t NSTEMI - see results above. Received PCI of left circumflex.  Recommended to continue DAPT x 1 year. Denies any chest pain. Will stop Aspirin and switch Brilinta to Plavix as she is on Eliquis - see above (#1). Continue atorvastatin, Toprol XL, NTG PRN, and Ranexa. Heart healthy diet and regular cardiovascular exercise encouraged. Care and ED precautions discussed.   HTN BP stable. Discussed to monitor BP at home at least 2 hours after  medications and sitting for 5-10 minutes. Continue current medication regimen. Will continue to monitor. Heart healthy diet and regular cardiovascular exercise encouraged.   HLD Most recent LDL 64 01/2023. She has previous order for FLP in August 2024. Will obtain FLP and CMET. No medication changes at this time. Heart healthy diet and regular cardiovascular exercise encouraged.   5. Easy bruising/bleeding Admits to generalized bruising along her forearms, also mitts to recent episode of bleeding from right ear, this has resolved.  Denies any recurrent episodes.  Medication adjustments as noted above.  Will obtain CBC and additional lab work as mentioned above.  6.  Stress Admits to significant stress after tragic loss of husband.  She tells me her husband passed away in his sleep and she found him deceased lying in bed.  She also admits to significant loss in her family. In 2012 she lost her father to cancer as well as her daughter to cancer soon after losing her father.  Denies any red flag signs or symptoms.  Denies any SI/HI.  Recommended referral to therapy with Esmond Harps (therapist/social worker), and she is agreeable to proceed.  Will arrange.   Dispo: Follow-up with Sydney [name] or APP in 6 months or sooner if anything changes.   Signed, Sharlene Dory, NP

## 2023-10-23 NOTE — Patient Instructions (Addendum)
 Medication Instructions:  Your physician recommends that you continue on your current medications as directed. Please refer to the Current Medication list given to you today. Please Stop Brilinta and Aspirin  Please Start Plavix 75 Mg daily until August 1st 2025   Labwork: In 1-2 weeks at [facility]   Testing/Procedures: None   Follow-Up: Your physician recommends that you schedule a follow-up appointment in: 6 Months   Any Other Special Instructions Will Be Listed Below (If Applicable).  If you need a refill on your cardiac medications before your next appointment, please call your pharmacy.

## 2023-11-14 ENCOUNTER — Other Ambulatory Visit: Payer: Self-pay | Admitting: Family Medicine

## 2023-11-14 DIAGNOSIS — F4320 Adjustment disorder, unspecified: Secondary | ICD-10-CM

## 2023-11-24 ENCOUNTER — Telehealth: Payer: Self-pay | Admitting: Nurse Practitioner

## 2023-11-24 ENCOUNTER — Encounter: Payer: Self-pay | Admitting: Nurse Practitioner

## 2023-11-24 NOTE — Telephone Encounter (Signed)
 Pt states that she is returning a call that she missed from the office this morning. Please advise

## 2023-11-24 NOTE — Telephone Encounter (Signed)
 Advised patient we did not call her today

## 2023-12-21 ENCOUNTER — Other Ambulatory Visit: Payer: Self-pay | Admitting: *Deleted

## 2023-12-21 DIAGNOSIS — I4891 Unspecified atrial fibrillation: Secondary | ICD-10-CM

## 2023-12-21 MED ORDER — APIXABAN 5 MG PO TABS: 5.0000 mg | ORAL_TABLET | Freq: Two times a day (BID) | ORAL | 0 refills | Status: DC

## 2023-12-26 ENCOUNTER — Encounter: Payer: Self-pay | Admitting: Family Medicine

## 2023-12-26 DIAGNOSIS — I10 Essential (primary) hypertension: Secondary | ICD-10-CM

## 2023-12-28 MED ORDER — AMLODIPINE BESYLATE 5 MG PO TABS: 5.0000 mg | ORAL_TABLET | Freq: Every day | ORAL | 3 refills | Status: AC

## 2023-12-28 MED ORDER — METOPROLOL SUCCINATE ER 25 MG PO TB24: 25.0000 mg | ORAL_TABLET | Freq: Every day | ORAL | 3 refills | Status: AC

## 2024-01-07 ENCOUNTER — Other Ambulatory Visit: Payer: Self-pay | Admitting: Family Medicine

## 2024-01-07 DIAGNOSIS — F4320 Adjustment disorder, unspecified: Secondary | ICD-10-CM

## 2024-01-25 ENCOUNTER — Encounter: Payer: Self-pay | Admitting: Family Medicine

## 2024-01-25 ENCOUNTER — Ambulatory Visit: Payer: Medicare (Managed Care) | Admitting: Family Medicine

## 2024-01-25 VITALS — BP 110/72 | HR 67 | Temp 98.1°F | Ht 65.5 in | Wt 213.0 lb

## 2024-01-25 DIAGNOSIS — M19042 Primary osteoarthritis, left hand: Secondary | ICD-10-CM

## 2024-01-25 DIAGNOSIS — M7552 Bursitis of left shoulder: Secondary | ICD-10-CM | POA: Diagnosis not present

## 2024-01-25 DIAGNOSIS — G479 Sleep disorder, unspecified: Secondary | ICD-10-CM | POA: Diagnosis not present

## 2024-01-25 DIAGNOSIS — F4320 Adjustment disorder, unspecified: Secondary | ICD-10-CM

## 2024-01-25 DIAGNOSIS — Z0000 Encounter for general adult medical examination without abnormal findings: Secondary | ICD-10-CM

## 2024-01-25 DIAGNOSIS — F331 Major depressive disorder, recurrent, moderate: Secondary | ICD-10-CM

## 2024-01-25 DIAGNOSIS — Z0001 Encounter for general adult medical examination with abnormal findings: Secondary | ICD-10-CM | POA: Diagnosis not present

## 2024-01-25 MED ORDER — METHYLPREDNISOLONE ACETATE 40 MG/ML IJ SUSP
40.0000 mg | Freq: Once | INTRAMUSCULAR | Status: AC
  Administered 2024-01-25: 40 mg via INTRAMUSCULAR

## 2024-01-25 MED ORDER — CARIPRAZINE HCL 1.5 MG PO CAPS: 1.5000 mg | ORAL_CAPSULE | Freq: Every day | ORAL | 3 refills | Status: DC

## 2024-01-25 NOTE — Progress Notes (Signed)
 Subjective:    Sydney [name] is a 63 y.o. female who presents for a Welcome to Medicare exam.   Sleep difficulties: She reports that she has been having sleep difficulties despite Atarax  100 mg.  She is compliant with Zoloft  100 mg.  She continues to grieve the loss of her husband.  No emotional eating.  She does feel like she has been isolating more and not enjoying normal activities.  Left shoulder pain She reports left-sided shoulder pain that radiates into the bicep.  No preceding injury.  This has been ongoing for the last couple of days.  Has not tried any medications or treatments because it has been short-lived.  Cannot take NSAIDs secondary to chronic anticoagulation and CAD  Left finger pain Reports left medial finger pain with no injury.  She notes that it is difficult to form a fist.  She is right-hand dominant.  No treatment so far.        Objective:    Today's Vitals   01/25/24 1411  Pulse: 67  Temp: 98.1 F (36.7 C)  SpO2: 96%  Weight: 213 lb (96.6 kg)  Height: 5' 5.5 (1.664 m)  Body mass index is 34.91 kg/m.  Medications Outpatient Encounter Medications as of 01/25/2024  Medication Sig   albuterol  (VENTOLIN  HFA) 108 (90 Base) MCG/ACT inhaler Inhale 2 puffs into the lungs every 6 (six) hours as needed for wheezing or shortness of breath.   amLODipine  (NORVASC ) 5 MG tablet Take 1 tablet (5 mg total) by mouth daily.   apixaban  (ELIQUIS ) 5 MG TABS tablet Take 1 tablet (5 mg total) by mouth 2 (two) times daily.   atorvastatin  (LIPITOR) 40 MG tablet Take 1 tablet (40 mg total) by mouth daily.   cariprazine  (VRAYLAR ) 1.5 MG capsule Take 1 capsule (1.5 mg total) by mouth at bedtime. For depression   clopidogrel  (PLAVIX ) 75 MG tablet Take 1 tablet (75 mg total) by mouth daily. Stop August 1st 2025   EPINEPHrine  (EPIPEN  2-PAK) 0.3 mg/0.3 mL IJ SOAJ injection Inject 0.3 mg into the muscle as needed for anaphylaxis.   hydrOXYzine  (VISTARIL ) 100 MG capsule TAKE 1 CAPSULE  BY MOUTH AT BEDTIME AS NEEDED FOR REST/SLEEP.   loratadine  (CLARITIN ) 10 MG tablet Take 10 mg by mouth daily.   metoprolol  succinate (TOPROL -XL) 25 MG 24 hr tablet Take 1 tablet (25 mg total) by mouth daily.   neomycin -polymyxin-hydrocortisone  (CORTISPORIN) OTIC solution Place 4 drops into the right ear 4 (four) times daily.   nitroGLYCERIN  (NITROSTAT ) 0.4 MG SL tablet Place 1 tablet (0.4 mg total) under the tongue every 5 (five) minutes as needed for chest pain. Then call 911 or go to ER   ranolazine  (RANEXA ) 500 MG 12 hr tablet Take 1 tablet by mouth twice daily   sertraline  (ZOLOFT ) 100 MG tablet Take 1 tablet (100 mg total) by mouth daily.   silodosin  (RAPAFLO ) 8 MG CAPS capsule TAKE 1 CAPSULE (8 MG TOTAL) BY MOUTH DAILY AS NEEDED. FOR STONE SYMPTOMS.   azelastine  (ASTELIN ) 0.1 % nasal spray Place 1 spray into both nostrils 2 (two) times daily. (Patient not taking: Reported on 10/23/2023)   cetirizine (ZYRTEC) 10 MG tablet Take 10 mg by mouth daily. (Patient not taking: Reported on 10/23/2023)   fluticasone (FLONASE  SENSIMIST) 27.5 MCG/SPRAY nasal spray Place 2 sprays into the nose daily. (Patient not taking: Reported on 10/23/2023)   HYDROcodone -acetaminophen  (NORCO) 5-325 MG tablet Take 1 tablet by mouth every 6 (six) hours as needed for severe pain (pain  score 7-10). (Patient not taking: Reported on 10/23/2023)   nystatin  (MYCOSTATIN ) 100000 UNIT/ML suspension Take 5 mLs (500,000 Units total) by mouth 4 (four) times daily. Swish and swallow (Patient not taking: Reported on 10/23/2023)   solifenacin  (VESICARE ) 5 MG tablet Take 1 tablet (5 mg total) by mouth daily. (Patient not taking: Reported on 10/23/2023)   Facility-Administered Encounter Medications as of 01/25/2024  Medication   methylPREDNISolone  acetate (DEPO-MEDROL ) injection 40 mg     History: Past Medical History:  Diagnosis Date   Allergy    Anemia    history of   Anxiety    Arthritis    Asthma    Atherosclerosis    Depression     Family history of adverse reaction to anesthesia    pt's mother felt everything and couldn't speak during a gallbladder surgery   GERD (gastroesophageal reflux disease)    History of hiatal hernia    History of kidney stones    Hypertension    MI (myocardial infarction) (HCC)    Pneumonia    a few times   TIA (transient ischemic attack) 2012   Past Surgical History:  Procedure Laterality Date   ABDOMINAL HYSTERECTOMY     abdominal   APPENDECTOMY     bladder tack     CARPAL TUNNEL RELEASE Bilateral    CERVICAL SPINE SURGERY     5-6, 6-7   CHOLECYSTECTOMY     COLONOSCOPY WITH PROPOFOL  N/A 07/10/2020   Procedure: COLONOSCOPY WITH PROPOFOL ;  Surgeon: Cindie Carlin POUR, DO;  Location: [facility];  Service: Endoscopy;  Laterality: N/A;  12:15pm   CORONARY STENT INTERVENTION N/A 02/03/2023   Procedure: CORONARY STENT INTERVENTION;  Surgeon: Anner Alm ORN, MD;  Location: [facility] INVASIVE CV LAB;  Service: Cardiovascular;  Laterality: N/A;   ENDOVENOUS ABLATION SAPHENOUS VEIN W/ LASER Left 11/28/2021   endovenous laser ablation left greater saphenous vein by Gaile New MD   HAMMER TOE SURGERY Right    JOINT REPLACEMENT     LEFT HEART CATH AND CORONARY ANGIOGRAPHY N/A 02/03/2023   Procedure: LEFT HEART CATH AND CORONARY ANGIOGRAPHY;  Surgeon: Anner Alm ORN, MD;  Location: [facility] INVASIVE CV LAB;  Service: Cardiovascular;  Laterality: N/A;   TOTAL HIP 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   Procedure: LEFT TOTAL HIP ARTHROPLASTY -DIRECT ANTERIOR;  Surgeon: Barbarann Oneil BROCKS, MD;  Location: [facility] OR;  Service: Orthopedics;  Laterality: Left;   TOTAL HIP ARTHROPLASTY Right 12/05/2019   Procedure: RIGHT TOTAL HIP ARTHROPLASTY ANTERIOR APPROACH  DIRECT ANTERIOR;  Surgeon: Barbarann Oneil BROCKS, MD;  Location: [facility] OR;  Service: Orthopedics;  Laterality: Right;    Family History  Problem Relation Age of Onset   Anxiety disorder Mother    Depression Mother    Heart disease Mother    Hypertension Mother    Arrhythmia  Mother    Cancer Father        colon, age greater than 87   Lung cancer Father    Migraines Sister    Cancer Daughter        nerve sheath sarcoma   Alcohol abuse Brother    Breast cancer Neg Hx    Social History   Occupational History   Not on file  Tobacco Use   Smoking status: Every Day    Current packs/day: 0.50    Average packs/day: 0.5 packs/day for 23.0 years (11.5 ttl pk-yrs)    Types: Cigarettes   Smokeless tobacco: Never  Vaping Use   Vaping status: Never Used  Substance and Sexual Activity   Alcohol use: Yes    Comment: occ   Drug use: Never   Sexual activity: Not Currently    Tobacco Counseling Ready to quit: Not Answered Counseling given: Yes   Immunizations and Health Maintenance Immunization History  Administered Date(s) Administered   Influenza,inj,Quad PF,6+ Mos 04/17/2010, 04/03/2011, 04/24/2013, 05/08/2014, 04/18/2015, 04/28/2016, 05/14/2017, 07/01/2018, 04/15/2019, 04/10/2020, 05/08/2021, 04/04/2022   Influenza,inj,quad, With Preservative 04/25/2016   Influenza-Unspecified 05/10/2012, 06/08/2013   PFIZER(Purple Top)SARS-COV-2 Vaccination 10/13/2019, 11/05/2019   Pneumococcal Polysaccharide-23 10/30/2010   Pneumococcal-Unspecified 05/09/2016   Tdap 02/02/2017   Tetanus 07/22/2007   Health Maintenance Due  Topic Date Due   Zoster Vaccines- Shingrix (1 of 2) Never done    Activities of Daily Living    01/21/2024   11:14 AM 02/02/2023    6:45 PM  In your present state of health, do you have any difficulty performing the following activities:  Hearing? 0 0  Vision? 0 0  Difficulty concentrating or making decisions? 1 0  Walking or climbing stairs? 0 0  Dressing or bathing? 0 0  Doing errands, shopping? 0 0  Preparing Food and eating ? N   Using the Toilet? N   In the past six months, have you accidently leaked urine? Y   Do you have problems with loss of bowel control? N   Managing your Medications? N   Managing your Finances? N    Housekeeping or managing your Housekeeping? N     Physical Exam   Physical Exam (optional), or other factors deemed appropriate based on the beneficiary's medical and social history and current clinical standards. General: Nontoxic-appearing obese female Cardio: Regular rate and rhythm.  No murmurs appreciated Pulmonary: Clear to auscultation bilaterally with normal work of breathing on room air.  No wheezes, rhonchi or rales MSK: Has fairly preserved range of motion of the left shoulder.  No gross deformities appreciated.  Left middle finger without trigger but has some osteoarthritic changes.  No gross erythema, edema or warmth Psych: Pleasant, interactive     01/25/2024    2:04 PM 09/21/2023   11:08 AM 08/07/2023   11:45 AM  Depression screen PHQ 2/9  Decreased Interest 0 1 2  Down, Depressed, Hopeless 0 1 2  PHQ - 2 Score 0 2 4  Altered sleeping  1 1  Tired, decreased energy  1 0  Change in appetite  1 0  Feeling bad or failure about yourself   1 0  Trouble concentrating  1 0  Moving slowly or fidgety/restless  0 0  Suicidal thoughts  0 0  PHQ-9 Score  7 5  Difficult doing work/chores  Somewhat difficult Not difficult at all      08/07/2023   11:45 AM 02/20/2023    3:31 PM 11/03/2022    2:24 PM 09/24/2022    2:55 PM  GAD 7 : Generalized Anxiety Score  Nervous, Anxious, on Edge 1 2 1 2   Control/stop worrying 1 2 1 2   Worry too much - different things 2 2 1 2   Trouble relaxing 2 2 1 2   Restless 1 1 0 2  Easily annoyed or irritable 1 1 1 2   Afraid - awful might happen 1 2 1 1   Total GAD 7 Score 9 12 6 13   Anxiety Difficulty Somewhat difficult Somewhat difficult Somewhat difficult Somewhat difficult       Advanced Directives: Does Patient Have a Medical Advance Directive?: No Would patient like information on creating a  medical advance directive?: Yes (Inpatient - patient defers creating a medical advance directive at this time - Information given)  EKG:  Has  cardiologist and had recent EKG in February      Assessment:    This is a routine wellness examination for this patient .   Vision/Hearing screen Vision Screening   Right eye Left eye Both eyes  Without correction     With correction 20/50 20/25 20/40      Goals      Quit Smoking        Depression Screen    01/25/2024    2:04 PM 09/21/2023   11:08 AM 08/07/2023   11:45 AM 04/29/2023   10:01 AM  PHQ 2/9 Scores  PHQ - 2 Score 0 2 4 4   PHQ- 9 Score  7 5 9      Fall Risk    01/21/2024   11:14 AM  Fall Risk   Falls in the past year? 0    Cognitive Function:    01/25/2024    2:14 PM  MMSE - Mini Mental State Exam  Orientation to time 5  Orientation to Place 5  Registration 3  Attention/ Calculation 4  Recall 3  Language- name 2 objects 2  Language- repeat 1  Language- follow 3 step command 3  Language- read & follow direction 1  Write a sentence 1  Copy design 1  Total score 29        Patient Care Team: Jolinda Norene HERO, DO as PCP - General (Family Medicine) Mallipeddi, Diannah SQUIBB, MD as PCP - Cardiology (Cardiology) Cindie Carlin POUR, DO as Consulting Physician (Internal Medicine) Barbarann Oneil BROCKS, MD (Inactive) as Consulting Physician (Orthopedic Surgery)     Plan:    Welcome to Medicare preventive visit  Acute bursitis of left shoulder - Plan: methylPREDNISolone  acetate (DEPO-MEDROL ) injection 40 mg  Osteoarthritis of left middle finger - Plan: DG Finger Middle Left  Sleep difficulties  Moderate episode of recurrent major depressive disorder (HCC) - Plan: cariprazine  (VRAYLAR ) 1.5 MG capsule  Grief reaction  Will trial her on Vraylar  to see if this might boost her Zoloft .  Will plan for ramelteon if still needing something for sleep versus Belsomra.  Okay to DC Atarax   Depo-Medrol  administered for what sounds like bursitis in the left shoulder.  Hopefully this will help with the OA in her middle finger.  Not a candidate for NSAIDs secondary to  anticoagulation.  Okay to take Tylenol  and use topical NSAID if needed.  Plain films of left middle finger placed if she needs this  I have personally reviewed and noted the following in the patient's chart:   Medical and social history Use of alcohol, tobacco or illicit drugs  Current medications and supplements including opioid prescriptions. Patient is currently taking opioid prescriptions. Information provided to patient regarding non-opioid alternatives. Patient advised to discuss non-opioid treatment plan with their provider. Functional ability and status Nutritional status Physical activity Advanced directives List of other physicians Hospitalizations, surgeries, and ER visits in previous 12 months Vitals Screenings to include cognitive, depression, and falls Referrals and appointments  In addition, I have reviewed and discussed with patient certain preventive protocols, quality metrics, and best practice recommendations. A written personalized care plan for preventive services as well as general preventive health recommendations were provided to patient.     Norene Jolinda, DO 01/25/2024

## 2024-02-08 DIAGNOSIS — H5213 Myopia, bilateral: Secondary | ICD-10-CM | POA: Diagnosis not present

## 2024-02-08 DIAGNOSIS — H524 Presbyopia: Secondary | ICD-10-CM | POA: Diagnosis not present

## 2024-02-08 DIAGNOSIS — H52223 Regular astigmatism, bilateral: Secondary | ICD-10-CM | POA: Diagnosis not present

## 2024-02-16 ENCOUNTER — Encounter: Payer: Self-pay | Admitting: Family Medicine

## 2024-02-17 ENCOUNTER — Telehealth: Payer: Self-pay

## 2024-02-17 ENCOUNTER — Ambulatory Visit: Payer: Self-pay | Admitting: Family Medicine

## 2024-02-17 ENCOUNTER — Encounter: Payer: Self-pay | Admitting: Family Medicine

## 2024-02-17 ENCOUNTER — Telehealth (INDEPENDENT_AMBULATORY_CARE_PROVIDER_SITE_OTHER): Payer: Medicare (Managed Care) | Admitting: Family Medicine

## 2024-02-17 DIAGNOSIS — R3129 Other microscopic hematuria: Secondary | ICD-10-CM

## 2024-02-17 DIAGNOSIS — R350 Frequency of micturition: Secondary | ICD-10-CM

## 2024-02-17 DIAGNOSIS — R300 Dysuria: Secondary | ICD-10-CM

## 2024-02-17 DIAGNOSIS — Z87442 Personal history of urinary calculi: Secondary | ICD-10-CM | POA: Diagnosis not present

## 2024-02-17 LAB — URINALYSIS, ROUTINE W REFLEX MICROSCOPIC
Bilirubin, UA: NEGATIVE
Glucose, UA: NEGATIVE
Ketones, UA: NEGATIVE
Leukocytes,UA: NEGATIVE
Nitrite, UA: NEGATIVE
Protein,UA: NEGATIVE
Specific Gravity, UA: 1.01 (ref 1.005–1.030)
Urobilinogen, Ur: 0.2 mg/dL (ref 0.2–1.0)
pH, UA: 6.5 (ref 5.0–7.5)

## 2024-02-17 LAB — MICROSCOPIC EXAMINATION
Bacteria, UA: NONE SEEN
Renal Epithel, UA: NONE SEEN /HPF
Yeast, UA: NONE SEEN

## 2024-02-17 MED ORDER — TAMSULOSIN HCL 0.4 MG PO CAPS: 0.4000 mg | ORAL_CAPSULE | Freq: Every day | ORAL | 0 refills | Status: DC

## 2024-02-17 NOTE — Progress Notes (Addendum)
 Virtual Visit via Video   I connected with patient on 02/17/24 at 1530 by a video enabled telemedicine application and verified that I am speaking with the correct person using two identifiers.  Location patient: Home Location provider: [facility] Persons participating in the virtual visit: Patient and Provider  I discussed the limitations of evaluation and management by telemedicine and the availability of in person appointments. The patient expressed understanding and agreed to proceed.  Subjective:   HPI:  Pt presents today for  Chief Complaint  Patient presents with   Urinary Frequency    Sydney [name] is a 63 year old female with a history of kidney stones who presents with increased urinary frequency and discomfort.  She has been experiencing increased urinary frequency, urinating three to four times a night, and discomfort when sitting. No burning sensation during urination is noted. She is concerned about the possibility of another kidney stone due to her history.  She has had multiple episodes of kidney stones in the past, with the last episode involving pain in the vaginal wall, initially thought to be a cyst.  She has not consulted her urologist recently due to changes in her insurance following her husband's death and his job loss.  She denies specific flank pain, although she experiences general back pain not localized to one side. No fever is reported, and symptoms have persisted for about three days.  She is planning a vacation on the 13th.      ROS per HPI  Patient Active Problem List   Diagnosis Date Noted   Osteoarthritis of left middle finger 01/25/2024   Acute bursitis of left shoulder 01/25/2024   CAD (coronary artery disease) 06/04/2023   HLD (hyperlipidemia) 06/04/2023   Long term (current) use of antithrombotics/antiplatelets 06/04/2023   S/P coronary angioplasty 06/04/2023   Nasal congestion with rhinorrhea  04/29/2023   Body aches 04/29/2023   NSTEMI (non-ST elevated myocardial infarction) (HCC) 02/02/2023   Nicotine  abuse 02/02/2023   Weakness of left quadriceps muscle 11/06/2022   Facet degeneration of lumbar region 02/13/2022   Peripheral vascular disease with stasis dermatitis 09/03/2021   Urine frequency 05/10/2021   History of bilateral hip arthroplasty 01/03/2021   Trochanteric bursitis, left hip 09/13/2020   Closed fracture of multiple pubic rami, left, sequela 08/16/2020   H/O adenomatous polyp of colon 05/23/2020   FH: colon cancer in relative diagnosed at >44 years old 05/23/2020   Impingement syndrome of right shoulder 05/03/2020   H/O total hip arthroplasty 12/06/2019   DDD (degenerative disc disease), lumbosacral 07/01/2018   Venous insufficiency of both lower extremities 11/17/2016   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 level with radiculopathy 08/18/2016   Chronic pain syndrome 10/24/2015   Dysfunction of both eustachian tubes 07/26/2014   Impaired fasting glucose 04/05/2014   History of bladder surgery 04/03/2014   History of hysterectomy 04/03/2014   Urge incontinence 04/03/2014   Asthma 07/28/2013   Nontoxic uninodular goiter 07/28/2013   Solitary pulmonary nodule 07/28/2013   Lumbar radiculopathy 04/20/2013   Insomnia 03/22/2013   Essential hypertension 08/18/2011   Moderate episode of recurrent major depressive disorder (HCC) 04/03/2011   Other B-complex deficiencies 08/16/2010   Morbid obesity due to excess calories (HCC) 05/17/2010   Family history of cerebrovascular accident (CVA) 04/17/2010   Family history of malignant neoplasm of gastrointestinal tract 04/17/2010   Family history of other cardiovascular diseases(V17.49) 04/17/2010    Social History   Tobacco Use   Smoking status: Every  Day    Current packs/day: 0.50    Average packs/day: 0.5 packs/day for 23.0 years (11.5 ttl pk-yrs)    Types: Cigarettes   Smokeless tobacco: Never  Substance Use Topics    Alcohol use: Yes    Comment: occ    Current Outpatient Medications:    tamsulosin  (FLOMAX ) 0.4 MG CAPS capsule, Take 1 capsule (0.4 mg total) by mouth daily., Disp: 30 capsule, Rfl: 0   albuterol  (VENTOLIN  HFA) 108 (90 Base) MCG/ACT inhaler, Inhale 2 puffs into the lungs every 6 (six) hours as needed for wheezing or shortness of breath., Disp: 18 g, Rfl: 0   amLODipine  (NORVASC ) 5 MG tablet, Take 1 tablet (5 mg total) by mouth daily., Disp: 90 tablet, Rfl: 3   apixaban  (ELIQUIS ) 5 MG TABS tablet, Take 1 tablet (5 mg total) by mouth 2 (two) times daily., Disp: 180 tablet, Rfl: 0   atorvastatin  (LIPITOR) 40 MG tablet, Take 1 tablet (40 mg total) by mouth daily., Disp: 90 tablet, Rfl: 3   azelastine  (ASTELIN ) 0.1 % nasal spray, Place 1 spray into both nostrils 2 (two) times daily. (Patient not taking: Reported on 10/23/2023), Disp: 30 mL, Rfl: 12   cariprazine  (VRAYLAR ) 1.5 MG capsule, Take 1 capsule (1.5 mg total) by mouth at bedtime. For depression, Disp: 90 capsule, Rfl: 3   cetirizine (ZYRTEC) 10 MG tablet, Take 10 mg by mouth daily. (Patient not taking: Reported on 10/23/2023), Disp: , Rfl:    clopidogrel  (PLAVIX ) 75 MG tablet, Take 1 tablet (75 mg total) by mouth daily. Stop August 1st 2025, Disp: 119 tablet, Rfl: 0   EPINEPHrine  (EPIPEN  2-PAK) 0.3 mg/0.3 mL IJ SOAJ injection, Inject 0.3 mg into the muscle as needed for anaphylaxis., Disp: 2 each, Rfl: 0   fluticasone (FLONASE  SENSIMIST) 27.5 MCG/SPRAY nasal spray, Place 2 sprays into the nose daily. (Patient not taking: Reported on 10/23/2023), Disp: 10 g, Rfl: 12   HYDROcodone -acetaminophen  (NORCO) 5-325 MG tablet, Take 1 tablet by mouth every 6 (six) hours as needed for severe pain (pain score 7-10). (Patient not taking: Reported on 10/23/2023), Disp: 30 tablet, Rfl: 0   hydrOXYzine  (VISTARIL ) 100 MG capsule, TAKE 1 CAPSULE BY MOUTH AT BEDTIME AS NEEDED FOR REST/SLEEP., Disp: 30 capsule, Rfl: 0   loratadine  (CLARITIN ) 10 MG tablet, Take 10 mg by  mouth daily., Disp: , Rfl:    metoprolol  succinate (TOPROL -XL) 25 MG 24 hr tablet, Take 1 tablet (25 mg total) by mouth daily., Disp: 90 tablet, Rfl: 3   neomycin -polymyxin-hydrocortisone  (CORTISPORIN) OTIC solution, Place 4 drops into the right ear 4 (four) times daily., Disp: 10 mL, Rfl: 0   nitroGLYCERIN  (NITROSTAT ) 0.4 MG SL tablet, Place 1 tablet (0.4 mg total) under the tongue every 5 (five) minutes as needed for chest pain. Then call 911 or go to ER, Disp: 50 tablet, Rfl: 0   nystatin  (MYCOSTATIN ) 100000 UNIT/ML suspension, Take 5 mLs (500,000 Units total) by mouth 4 (four) times daily. Swish and swallow (Patient not taking: Reported on 10/23/2023), Disp: 120 mL, Rfl: 0   ranolazine  (RANEXA ) 500 MG 12 hr tablet, Take 1 tablet by mouth twice daily, Disp: 180 tablet, Rfl: 3   sertraline  (ZOLOFT ) 100 MG tablet, Take 1 tablet (100 mg total) by mouth daily., Disp: 90 tablet, Rfl: 3   silodosin  (RAPAFLO ) 8 MG CAPS capsule, TAKE 1 CAPSULE (8 MG TOTAL) BY MOUTH DAILY AS NEEDED. FOR STONE SYMPTOMS., Disp: 30 capsule, Rfl: 0   solifenacin  (VESICARE ) 5 MG tablet, Take 1 tablet (  5 mg total) by mouth daily. (Patient not taking: Reported on 10/23/2023), Disp: 30 tablet, Rfl: 11  Allergies  Allergen Reactions   Bee Pollen Anaphylaxis and Swelling   Bee Venom Anaphylaxis and Swelling   Butorphanol Other (See Comments)    Not sure told by md she was allergic after surgery.    Meloxicam Anxiety    Mood disorder Altered her personality    Penicillins Anaphylaxis and Hives    Unable to Recall As child mom was told she is highly allergic Did it involve swelling of the face/tongue/throat, SOB, or low BP? Unknown Did it involve sudden or severe rash/hives, skin peeling, or any reaction on the inside of your mouth or nose? Unknown Did you need to seek medical attention at a hospital or doctor's office? Unknown When did it last happen?      childhood allergy If all above answers are "NO", may proceed with  cephalosporin use.    Prochlorperazine Edisylate Anaphylaxis   Sulfa Antibiotics Anaphylaxis    Unable to Recall   Tramadol Anxiety    Didn't like the way it made her feel    Rosuvastatin  Other (See Comments)    Per pt, keeps her awake when she takes rosuvastatin  daily so decreased to every other day    Clindamycin  Hives   Levaquin  [Levofloxacin ] Itching   Topiramate Nausea And Vomiting   Doxycycline  Dermatitis, Hives, Itching, Photosensitivity and Rash    Objective:   There were no vitals taken for this visit.  Patient is well-developed, well-nourished in no acute distress.  Resting comfortably at home.  Head is normocephalic, atraumatic.  No labored breathing.  Speech is clear and coherent with logical content.  Patient is alert and oriented at baseline.    Assessment and Plan:   Sydney [name] was seen today for urinary frequency.  Diagnoses and all orders for this visit:  Dysuria -     Urinalysis, Routine w reflex microscopic -     Urine Culture -     Microscopic Examination -     tamsulosin  (FLOMAX ) 0.4 MG CAPS capsule; Take 1 capsule (0.4 mg total) by mouth daily.  Frequency of urination -     tamsulosin  (FLOMAX ) 0.4 MG CAPS capsule; Take 1 capsule (0.4 mg total) by mouth daily.  Other microscopic hematuria -     tamsulosin  (FLOMAX ) 0.4 MG CAPS capsule; Take 1 capsule (0.4 mg total) by mouth daily.  History of renal stone -     tamsulosin  (FLOMAX ) 0.4 MG CAPS capsule; Take 1 capsule (0.4 mg total) by mouth daily.     Urinary frequency and nocturia with microscopic hematuria Urinary frequency and nocturia for three days with trace hematuria. No burning sensation or fever. History of kidney stones. No flank pain specific to one side. Urinalysis shows trace blood, no leukocytes or nitrites, and no infection. Culture pending to rule out infection. She has not seen her urologist recently due to insurance changes. - Prescribe Flomax  to aid in stone passage and alleviate  symptoms. - Advise her to increase fluid intake. - Warn about potential dizziness and hypotension with Flomax , especially when standing up quickly. - Instruct her to follow up with her urologist. - Send prescription to [facility] in [place]. - If urine culture shows infection, prescribe antibiotics. - Advise her to report any worsening symptoms or new developments.        Return if symptoms worsen or fail to improve.  Rosaline Bruns, FNP-C [facility] [address]  [place], [place] [address] [phone]  02/17/2024  Time spent with the patient: 12 minutes, of which >50% was spent in obtaining information about symptoms, reviewing previous labs, evaluations, and treatments, counseling about condition (please see the discussed topics above), and developing a plan to further investigate it; had a number of questions which I addressed.

## 2024-02-17 NOTE — Telephone Encounter (Signed)
 Copied from CRM [id number]. Topic: Appointments - Scheduling Inquiry for Clinic >> Feb 17, 2024  2:39 PM Selinda RAMAN wrote: Reason for CRM: The patient called in stating she knows she has a my chart video appointment today at 3:35 but she wanted to alert the office she is bringing by a urine sample now. She is on the way and will be arriving in the next 20 minutes or so. Please assist further

## 2024-02-17 NOTE — Telephone Encounter (Signed)
 noted

## 2024-02-18 ENCOUNTER — Ambulatory Visit: Payer: Medicare (Managed Care) | Admitting: Urology

## 2024-02-18 ENCOUNTER — Ambulatory Visit (HOSPITAL_COMMUNITY)
Admission: RE | Admit: 2024-02-18 | Discharge: 2024-02-18 | Disposition: A | Discharge status: Home / Self Care | Payer: Medicare (Managed Care) | Source: Ambulatory Visit | Attending: Urology | Admitting: Urology

## 2024-02-18 ENCOUNTER — Encounter: Payer: Self-pay | Admitting: Urology

## 2024-02-18 VITALS — BP 100/65 | HR 74

## 2024-02-18 DIAGNOSIS — N3281 Overactive bladder: Secondary | ICD-10-CM | POA: Diagnosis not present

## 2024-02-18 DIAGNOSIS — R102 Pelvic and perineal pain: Secondary | ICD-10-CM | POA: Diagnosis not present

## 2024-02-18 DIAGNOSIS — N3941 Urge incontinence: Secondary | ICD-10-CM | POA: Diagnosis not present

## 2024-02-18 DIAGNOSIS — M6289 Other specified disorders of muscle: Secondary | ICD-10-CM | POA: Diagnosis not present

## 2024-02-18 DIAGNOSIS — N200 Calculus of kidney: Secondary | ICD-10-CM | POA: Insufficient documentation

## 2024-02-18 LAB — MICROSCOPIC EXAMINATION: Epithelial Cells (non renal): >10 /HPF — ABNORMAL HIGH (ref 0–10)

## 2024-02-18 LAB — URINALYSIS, ROUTINE W REFLEX MICROSCOPIC
Bilirubin, UA: NEGATIVE
Glucose, UA: NEGATIVE
Ketones, UA: NEGATIVE
Leukocytes,UA: NEGATIVE
Nitrite, UA: NEGATIVE
Protein,UA: NEGATIVE
Specific Gravity, UA: 1.02 (ref 1.005–1.030)
Urobilinogen, Ur: 0.2 mg/dL (ref 0.2–1.0)
pH, UA: 6 (ref 5.0–7.5)

## 2024-02-18 LAB — BLADDER SCAN AMB NON-IMAGING: Scan Result: 0

## 2024-02-18 MED ORDER — SOLIFENACIN SUCCINATE 5 MG PO TABS
5.0000 mg | ORAL_TABLET | Freq: Every day | ORAL | 11 refills | Status: AC
Start: 2024-02-18 — End: ?

## 2024-02-18 MED ORDER — BACLOFEN 10 MG PO TABS: 10.0000 mg | ORAL_TABLET | Freq: Three times a day (TID) | ORAL | 0 refills | Status: DC | PRN

## 2024-02-18 NOTE — Progress Notes (Signed)
 Name: Sydney [name] DOB: Jul 04, 1961 MRN: [id number]  History of Present Illness: Sydney [name] is a 63 y.o. female who presents today for follow up visit at [facility]. Relevant History includes: 1. OAB with urinary frequency, urgency, and urge incontinence. - Drinks 1 cup of coffee daily.  2. Kidney stones.  At last visit on 06/2023: At last visit on 06/22/2023: - Had prior success with Vesicare  5 mg daily but had been off it for a few months due to loss of health insurance. - Reports morning caffeine intake (coffee); tries to avoid caffeine in the afternoon / evening.  - Reported intermittent LLQ abdominal pain x1 month.  - Advised the following: 1. KUB & RUS.  2. Rapaflo  (Silodosin ) 8 mg daily PRN for stone symptoms. 3. Restart Vesicare  5 mg daily. 4. Minimize caffeine intake. 5. Return in about 6 weeks (around 08/03/2023) for UA, PVR, & f/u with Sydney Oz NP.  Since last visit: > 06/29/2023:  - RUS showed no stones, masses, or hydronephrosis. - KUB showed a 5 mm left kidney stone lower pole stone.   Today: She reports recent suspected stone migration due to right vaginal side-wall pain. She denies vaginal pain, bleeding, abnormal discharge, itching, dryness.  Denies flank pain or abdominal pain; reports chronic back pain. She denies fevers, nausea, or vomiting.  She reports that within the past week she has had noticeably increased urinary urgency and frequency. Reports nocturia x3. Denies dysuria, gross hematuria, hesitancy, straining to void, or sensations of incomplete emptying.  She denies she has not been taking Vesicare  (Solifenacin ) 5 mg daily recently.   Medications: Current Outpatient Medications  Medication Sig Dispense Refill   albuterol  (VENTOLIN  HFA) 108 (90 Base) MCG/ACT inhaler Inhale 2 puffs into the lungs every 6 (six) hours as needed for wheezing or shortness of breath. 18 g 0   amLODipine  (NORVASC ) 5 MG tablet Take 1 tablet (5 mg  total) by mouth daily. 90 tablet 3   apixaban  (ELIQUIS ) 5 MG TABS tablet Take 1 tablet (5 mg total) by mouth 2 (two) times daily. 180 tablet 0   atorvastatin  (LIPITOR) 40 MG tablet Take 1 tablet (40 mg total) by mouth daily. 90 tablet 3   azelastine  (ASTELIN ) 0.1 % nasal spray Place 1 spray into both nostrils 2 (two) times daily. 30 mL 12   baclofen  (LIORESAL ) 10 MG tablet Take 1 tablet (10 mg total) by mouth every 8 (eight) hours as needed for muscle spasms. 30 tablet 0   cetirizine (ZYRTEC) 10 MG tablet Take 10 mg by mouth daily.     clopidogrel  (PLAVIX ) 75 MG tablet Take 1 tablet (75 mg total) by mouth daily. Stop August 1st 2025 119 tablet 0   EPINEPHrine  (EPIPEN  2-PAK) 0.3 mg/0.3 mL IJ SOAJ injection Inject 0.3 mg into the muscle as needed for anaphylaxis. 2 each 0   fluticasone (FLONASE  SENSIMIST) 27.5 MCG/SPRAY nasal spray Place 2 sprays into the nose daily. 10 g 12   HYDROcodone -acetaminophen  (NORCO) 5-325 MG tablet Take 1 tablet by mouth every 6 (six) hours as needed for severe pain (pain score 7-10). 30 tablet 0   loratadine  (CLARITIN ) 10 MG tablet Take 10 mg by mouth daily.     metoprolol  succinate (TOPROL -XL) 25 MG 24 hr tablet Take 1 tablet (25 mg total) by mouth daily. 90 tablet 3   neomycin -polymyxin-hydrocortisone  (CORTISPORIN) OTIC solution Place 4 drops into the right ear 4 (four) times daily. 10 mL 0   nitroGLYCERIN  (NITROSTAT ) 0.4 MG SL  tablet Place 1 tablet (0.4 mg total) under the tongue every 5 (five) minutes as needed for chest pain. Then call 911 or go to ER 50 tablet 0   ranolazine  (RANEXA ) 500 MG 12 hr tablet Take 1 tablet by mouth twice daily 180 tablet 3   sertraline  (ZOLOFT ) 100 MG tablet Take 1 tablet (100 mg total) by mouth daily. 90 tablet 3   tamsulosin  (FLOMAX ) 0.4 MG CAPS capsule Take 1 capsule (0.4 mg total) by mouth daily. 30 capsule 0   cariprazine  (VRAYLAR ) 1.5 MG capsule Take 1 capsule (1.5 mg total) by mouth at bedtime. For depression (Patient not taking:  Reported on 02/18/2024) 90 capsule 3   hydrOXYzine  (VISTARIL ) 100 MG capsule TAKE 1 CAPSULE BY MOUTH AT BEDTIME AS NEEDED FOR REST/SLEEP. (Patient not taking: Reported on 02/18/2024) 30 capsule 0   nystatin  (MYCOSTATIN ) 100000 UNIT/ML suspension Take 5 mLs (500,000 Units total) by mouth 4 (four) times daily. Swish and swallow (Patient not taking: Reported on 02/18/2024) 120 mL 0   silodosin  (RAPAFLO ) 8 MG CAPS capsule TAKE 1 CAPSULE (8 MG TOTAL) BY MOUTH DAILY AS NEEDED. FOR STONE SYMPTOMS. (Patient not taking: Reported on 02/18/2024) 30 capsule 0   solifenacin  (VESICARE ) 5 MG tablet Take 1 tablet (5 mg total) by mouth daily. 30 tablet 11   No current facility-administered medications for this visit.    Allergies: Allergies  Allergen Reactions   Bee Pollen Anaphylaxis and Swelling   Bee Venom Anaphylaxis and Swelling   Butorphanol Other (See Comments)    Not sure told by md she was allergic after surgery.    Meloxicam Anxiety    Mood disorder Altered her personality    Penicillins Anaphylaxis and Hives    Unable to Recall As child mom was told she is highly allergic Did it involve swelling of the face/tongue/throat, SOB, or low BP? Unknown Did it involve sudden or severe rash/hives, skin peeling, or any reaction on the inside of your mouth or nose? Unknown Did you need to seek medical attention at a hospital or doctor's office? Unknown When did it last happen?      childhood allergy If all above answers are "NO", may proceed with cephalosporin use.    Prochlorperazine Edisylate Anaphylaxis   Sulfa Antibiotics Anaphylaxis    Unable to Recall   Tramadol Anxiety    Didn't like the way it made her feel    Rosuvastatin  Other (See Comments)    Per pt, keeps her awake when she takes rosuvastatin  daily so decreased to every other day    Clindamycin  Hives   Levaquin  [Levofloxacin ] Itching   Topiramate Nausea And Vomiting   Doxycycline  Dermatitis, Hives, Itching, Photosensitivity and Rash     Past Medical History:  Diagnosis Date   Allergy    Anemia    history of   Anxiety    Arthritis    Asthma    Atherosclerosis    Depression    Family history of adverse reaction to anesthesia    pt's mother felt everything and couldn't speak during a gallbladder surgery   GERD (gastroesophageal reflux disease)    History of hiatal hernia    History of kidney stones    Hypertension    MI (myocardial infarction) (HCC)    Pneumonia    a few times   TIA (transient ischemic attack) 2012   Past Surgical History:  Procedure Laterality Date   ABDOMINAL HYSTERECTOMY     abdominal   APPENDECTOMY  bladder tack     CARPAL TUNNEL RELEASE Bilateral    CERVICAL SPINE SURGERY     5-6, 6-7   CHOLECYSTECTOMY     COLONOSCOPY WITH PROPOFOL  N/A 07/10/2020   Procedure: COLONOSCOPY WITH PROPOFOL ;  Surgeon: Cindie Carlin POUR, DO;  Location: [facility];  Service: Endoscopy;  Laterality: N/A;  12:15pm   CORONARY STENT INTERVENTION N/A 02/03/2023   Procedure: CORONARY STENT INTERVENTION;  Surgeon: Anner Alm ORN, MD;  Location: [facility] INVASIVE CV LAB;  Service: Cardiovascular;  Laterality: N/A;   ENDOVENOUS ABLATION SAPHENOUS VEIN W/ LASER Left 11/28/2021   endovenous laser ablation left greater saphenous vein by Gaile New MD   HAMMER TOE SURGERY Right    JOINT REPLACEMENT     LEFT HEART CATH AND CORONARY ANGIOGRAPHY N/A 02/03/2023   Procedure: LEFT HEART CATH AND CORONARY ANGIOGRAPHY;  Surgeon: Anner Alm ORN, MD;  Location: [facility] INVASIVE CV LAB;  Service: Cardiovascular;  Laterality: N/A;   TOTAL HIP 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   Procedure: LEFT TOTAL HIP ARTHROPLASTY -DIRECT ANTERIOR;  Surgeon: Barbarann Oneil BROCKS, MD;  Location: [facility] OR;  Service: Orthopedics;  Laterality: Left;   TOTAL HIP ARTHROPLASTY Right 12/05/2019   Procedure: RIGHT TOTAL HIP ARTHROPLASTY ANTERIOR APPROACH  DIRECT ANTERIOR;  Surgeon: Barbarann Oneil BROCKS, MD;  Location: [facility] OR;  Service: Orthopedics;  Laterality: Right;    Family History  Problem Relation Age of Onset   Anxiety disorder Mother    Depression Mother    Heart disease Mother    Hypertension Mother    Arrhythmia Mother    Cancer Father        colon, age greater than 39   Lung cancer Father    Migraines Sister    Cancer Daughter        nerve sheath sarcoma   Alcohol abuse Brother    Breast cancer Neg Hx    Social History   Socioeconomic History   Marital status: Married    Spouse name: Not on file   Number of children: 3   Years of education: Not on file   Highest education level: Associate degree: occupational, Scientist, product/process development, or vocational program  Occupational History   Not on file  Tobacco Use   Smoking status: Every Day    Current packs/day: 0.50    Average packs/day: 0.5 packs/day for 23.0 years (11.5 ttl pk-yrs)    Types: Cigarettes   Smokeless tobacco: Never  Vaping Use   Vaping status: Never Used  Substance and Sexual Activity   Alcohol use: Yes    Comment: occ   Drug use: Never   Sexual activity: Not Currently  Other Topics Concern   Not on file  Social History Narrative   Recently relocated to [place] from [place].  She resides at home with her husband.   She has 3 children but 1 passed away from cancer.   Social Drivers of Corporate investment banker Strain: Low Risk  (01/21/2024)   Overall Financial Resource Strain (CARDIA)    Difficulty of Paying Living Expenses: Not hard at all  Food Insecurity: No Food Insecurity (01/21/2024)   Hunger Vital Sign    Worried About Running Out of Food in the Last Year: Never true    Ran Out of Food in the Last Year: Never true  Transportation Needs: No Transportation Needs (01/21/2024)   PRAPARE - Administrator, Civil Service (Medical): No    Lack of Transportation (Non-Medical): No  Physical Activity: Insufficiently Active (01/21/2024)  Exercise Vital Sign    Days of Exercise per Week: 1 day    Minutes of Exercise per Session: 20 min  Stress: Stress Concern  Present (01/21/2024)   [employer] of Occupational Health - Occupational Stress Questionnaire    Feeling of Stress: Very much  Social Connections: Moderately Isolated (01/21/2024)   Social Connection and Isolation Panel    Frequency of Communication with Friends and Family: More than three times a week    Frequency of Social Gatherings with Friends and Family: Twice a week    Attends Religious Services: 1 to 4 times per year    Active Member of [employer] or Organizations: No    Attends Banker Meetings: Not on file    Marital Status: Widowed  Intimate Partner Violence: Not At Risk (02/02/2023)   Humiliation, Afraid, Rape, and Kick questionnaire    Fear of Current or Ex-Partner: No    Emotionally Abused: No    Physically Abused: No    Sexually Abused: No    SUBJECTIVE  Review of Systems Constitutional: Patient denies any unintentional weight loss or change in strength lntegumentary: Patient denies any rashes or pruritus Cardiovascular: Patient denies chest pain or syncope Respiratory: Patient denies shortness of breath Gastrointestinal: Patient denies nausea, vomiting, constipation, or diarrhea  Musculoskeletal: Patient denies muscle cramps or weakness Neurologic: Patient denies convulsions or seizures Allergic/Immunologic: Patient denies recent allergic reaction(s) Hematologic/Lymphatic: Patient denies bleeding tendencies Endocrine: Patient denies heat/cold intolerance  GU: As per HPI.  OBJECTIVE Vitals:   02/18/24 1014  BP: 100/65  Pulse: 74   There is no height or weight on file to calculate BMI.  Physical Examination Constitutional: No obvious distress; patient is non-toxic appearing  Cardiovascular: No visible lower extremity edema.  Respiratory: The patient does not have audible wheezing/stridor; respirations do not appear labored  Gastrointestinal: Abdomen non-distended Musculoskeletal: Normal ROM of UEs  Skin: No obvious rashes/open sores   Neurologic: CN 2-12 grossly intact Psychiatric: Answered questions appropriately with normal affect  Hematologic/Lymphatic/Immunologic: No obvious bruises or sites of spontaneous bleeding  Urine microscopy: unremarkable  ASSESSMENT Kidney stones - Plan: Urinalysis, Routine w reflex microscopic, DG Abd 1 View, DG Abd 1 View  OAB (overactive bladder) - Plan: solifenacin  (VESICARE ) 5 MG tablet  Urge incontinence - Plan: BLADDER SCAN AMB NON-IMAGING, solifenacin  (VESICARE ) 5 MG tablet  Pelvic floor dysfunction in female - Plan: baclofen  (LIORESAL ) 10 MG tablet  Vaginal pain - Plan: baclofen  (LIORESAL ) 10 MG tablet  UA & history do not suggest acute stone etiology for her vaginal pain. We discussed suspected high-tone pelvic floor dysfunction, agreed to send in Baclofen  for a few days for pelvic floor muscle relaxation. She was advised to follow up with her GYN provider for further evaluation if it persists.   Advised to restart Vesicare  (Solifenacin ) 5 mg daily for OAB symptoms.  Will proceed with KUB today for stone surveillance and plan to follow up in 6 months with KUB for stone surveillance or sooner if needed. Patient verbalized understanding of and agreement with current plan. All questions were answered.  PLAN Advised the following: KUB today. Baclofen  every 8 hours PRN.  Restart Vesicare  (Solifenacin ) 5 mg daily. Return in about 6 months (around 08/20/2024) for OAB, Stone, with UA & PVR, will need KUB prior.  Orders Placed This Encounter  Procedures   Microscopic Examination   DG Abd 1 View    Standing Status:   Future    Expected Date:   02/18/2024  Expiration Date:   02/17/2025    Reason for Exam (SYMPTOM  OR DIAGNOSIS REQUIRED):   kidney stone    Preferred imaging location?:   [facility]   DG Abd 1 View    Standing Status:   Future    Expected Date:   08/20/2024    Expiration Date:   02/17/2025    Reason for Exam (SYMPTOM  OR DIAGNOSIS REQUIRED):   kidney  stone    Preferred imaging location?:   [facility]   Urinalysis, Routine w reflex microscopic   BLADDER SCAN AMB NON-IMAGING    It has been explained that the patient is to follow regularly with their PCP in addition to all other providers involved in their care and to follow instructions provided by these respective offices. Patient advised to contact urology clinic if any urologic-pertaining questions, concerns, new symptoms or problems arise in the interim period.  There are no Patient Instructions on file for this visit.  Electronically signed by:  Sydney KYM Oz, MSN, FNP-C, CUNP 02/18/2024 10:44 AM

## 2024-02-19 LAB — URINE CULTURE

## 2024-02-22 ENCOUNTER — Ambulatory Visit: Payer: Self-pay | Admitting: Urology

## 2024-02-26 ENCOUNTER — Telehealth (INDEPENDENT_AMBULATORY_CARE_PROVIDER_SITE_OTHER): Payer: Medicare (Managed Care) | Admitting: Family Medicine

## 2024-02-26 ENCOUNTER — Encounter: Payer: Self-pay | Admitting: Family Medicine

## 2024-02-26 DIAGNOSIS — G894 Chronic pain syndrome: Secondary | ICD-10-CM

## 2024-02-26 DIAGNOSIS — M5442 Lumbago with sciatica, left side: Secondary | ICD-10-CM

## 2024-02-26 DIAGNOSIS — F439 Reaction to severe stress, unspecified: Secondary | ICD-10-CM | POA: Diagnosis not present

## 2024-02-26 DIAGNOSIS — G8929 Other chronic pain: Secondary | ICD-10-CM

## 2024-02-26 DIAGNOSIS — M50122 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 level with radiculopathy: Secondary | ICD-10-CM

## 2024-02-26 DIAGNOSIS — F32A Depression, unspecified: Secondary | ICD-10-CM

## 2024-02-26 MED ORDER — HYDROCODONE-ACETAMINOPHEN 5-325 MG PO TABS: 1.0000 | ORAL_TABLET | Freq: Four times a day (QID) | ORAL | 0 refills | Status: DC | PRN

## 2024-02-26 MED ORDER — SERTRALINE HCL 100 MG PO TABS: 150.0000 mg | ORAL_TABLET | Freq: Every day | ORAL | 3 refills | Status: DC

## 2024-02-26 NOTE — Progress Notes (Signed)
 MyChart Video visit  Subjective: RR:[id number] PCP: Sydney [name], Sydney [name] YEP:Opdj Sydney [name] is a 63 y.o. female. Patient provides verbal consent for consult held via video.  Due to COVID-19 pandemic this visit was conducted virtually. This visit type was conducted due to national recommendations for restrictions regarding the COVID-19 Pandemic (e.g. social distancing, sheltering in place) in an effort to limit this patient's exposure and mitigate transmission in our community. All issues noted in this document were discussed and addressed.  A physical exam was not performed with this format.   Location of patient: home Location of provider: [facility] Others present for call: none  1. Depression/chronic pain She feels like crap. Vraylar  gave her migraines.  Took for a few weeks but she had to discontinue due to migraine headaches.  She notes increasing chronic pain due to stress regarding custody of her granddaughter Sydney [name] and bad weather.  She would like to try going back up on her Zoloft  150 mg as that seems to work fairly well for her in the past.  No SI or HI.   ROS: Per HPI  Allergies  Allergen Reactions   Bee Pollen Anaphylaxis and Swelling   Bee Venom Anaphylaxis and Swelling   Butorphanol Other (See Comments)    Not sure told by md she was allergic after surgery.    Meloxicam Anxiety    Mood disorder Altered her personality    Penicillins Anaphylaxis and Hives    Unable to Recall As child mom was told she is highly allergic Did it involve swelling of the face/tongue/throat, SOB, or low BP? Unknown Did it involve sudden or severe rash/hives, skin peeling, or any reaction on the inside of your mouth or nose? Unknown Did you need to seek medical attention at a hospital or doctor's office? Unknown When did it last happen?      childhood allergy If all above answers are "NO", may proceed with cephalosporin use.    Prochlorperazine Edisylate Anaphylaxis   Sulfa Antibiotics  Anaphylaxis    Unable to Recall   Tramadol Anxiety    Didn't like the way it made her feel    Rosuvastatin  Other (See Comments)    Per pt, keeps her awake when she takes rosuvastatin  daily so decreased to every other day    Clindamycin  Hives   Levaquin  [Levofloxacin ] Itching   Topiramate Nausea And Vomiting   Doxycycline  Dermatitis, Hives, Itching, Photosensitivity and Rash   Past Medical History:  Diagnosis Date   Allergy    Anemia    history of   Anxiety    Arthritis    Asthma    Atherosclerosis    Depression    Family history of adverse reaction to anesthesia    pt's mother felt everything and couldn't speak during a gallbladder surgery   GERD (gastroesophageal reflux disease)    History of hiatal hernia    History of kidney stones    Hypertension    MI (myocardial infarction) (HCC)    Pneumonia    a few times   TIA (transient ischemic attack) 2012    Current Outpatient Medications:    albuterol  (VENTOLIN  HFA) 108 (90 Base) MCG/ACT inhaler, Inhale 2 puffs into the lungs every 6 (six) hours as needed for wheezing or shortness of breath., Disp: 18 g, Rfl: 0   amLODipine  (NORVASC ) 5 MG tablet, Take 1 tablet (5 mg total) by mouth daily., Disp: 90 tablet, Rfl: 3   apixaban  (ELIQUIS ) 5 MG TABS tablet, Take 1 tablet (5 mg  total) by mouth 2 (two) times daily., Disp: 180 tablet, Rfl: 0   atorvastatin  (LIPITOR) 40 MG tablet, Take 1 tablet (40 mg total) by mouth daily., Disp: 90 tablet, Rfl: 3   azelastine  (ASTELIN ) 0.1 % nasal spray, Place 1 spray into both nostrils 2 (two) times daily., Disp: 30 mL, Rfl: 12   baclofen  (LIORESAL ) 10 MG tablet, Take 1 tablet (10 mg total) by mouth every 8 (eight) hours as needed for muscle spasms., Disp: 30 tablet, Rfl: 0   cariprazine  (VRAYLAR ) 1.5 MG capsule, Take 1 capsule (1.5 mg total) by mouth at bedtime. For depression (Patient not taking: Reported on 02/18/2024), Disp: 90 capsule, Rfl: 3   cetirizine (ZYRTEC) 10 MG tablet, Take 10 mg by mouth  daily., Disp: , Rfl:    EPINEPHrine  (EPIPEN  2-PAK) 0.3 mg/0.3 mL IJ SOAJ injection, Inject 0.3 mg into the muscle as needed for anaphylaxis., Disp: 2 each, Rfl: 0   fluticasone (FLONASE  SENSIMIST) 27.5 MCG/SPRAY nasal spray, Place 2 sprays into the nose daily., Disp: 10 g, Rfl: 12   HYDROcodone -acetaminophen  (NORCO) 5-325 MG tablet, Take 1 tablet by mouth every 6 (six) hours as needed for severe pain (pain score 7-10)., Disp: 30 tablet, Rfl: 0   hydrOXYzine  (VISTARIL ) 100 MG capsule, TAKE 1 CAPSULE BY MOUTH AT BEDTIME AS NEEDED FOR REST/SLEEP. (Patient not taking: Reported on 02/18/2024), Disp: 30 capsule, Rfl: 0   loratadine  (CLARITIN ) 10 MG tablet, Take 10 mg by mouth daily., Disp: , Rfl:    metoprolol  succinate (TOPROL -XL) 25 MG 24 hr tablet, Take 1 tablet (25 mg total) by mouth daily., Disp: 90 tablet, Rfl: 3   neomycin -polymyxin-hydrocortisone  (CORTISPORIN) OTIC solution, Place 4 drops into the right ear 4 (four) times daily., Disp: 10 mL, Rfl: 0   nitroGLYCERIN  (NITROSTAT ) 0.4 MG SL tablet, Place 1 tablet (0.4 mg total) under the tongue every 5 (five) minutes as needed for chest pain. Then call 911 or go to ER, Disp: 50 tablet, Rfl: 0   nystatin  (MYCOSTATIN ) 100000 UNIT/ML suspension, Take 5 mLs (500,000 Units total) by mouth 4 (four) times daily. Swish and swallow (Patient not taking: Reported on 02/18/2024), Disp: 120 mL, Rfl: 0   ranolazine  (RANEXA ) 500 MG 12 hr tablet, Take 1 tablet by mouth twice daily, Disp: 180 tablet, Rfl: 3   sertraline  (ZOLOFT ) 100 MG tablet, Take 1 tablet (100 mg total) by mouth daily., Disp: 90 tablet, Rfl: 3   silodosin  (RAPAFLO ) 8 MG CAPS capsule, TAKE 1 CAPSULE (8 MG TOTAL) BY MOUTH DAILY AS NEEDED. FOR STONE SYMPTOMS. (Patient not taking: Reported on 02/18/2024), Disp: 30 capsule, Rfl: 0   solifenacin  (VESICARE ) 5 MG tablet, Take 1 tablet (5 mg total) by mouth daily., Disp: 30 tablet, Rfl: 11   tamsulosin  (FLOMAX ) 0.4 MG CAPS capsule, Take 1 capsule (0.4 mg total) by  mouth daily., Disp: 30 capsule, Rfl: 0  Gen: Well-appearing female in no acute distress Psych: Mood stable, speech normal, affect appropriate.  Thought process linear.  Good eye contact.  Good insight MSK: Moving all extremities.  Ambulating independently  Assessment/ Plan: 63 y.o. female   Depressive disorder - Plan: sertraline  (ZOLOFT ) 100 MG tablet, Amb ref to [facility]  Stress  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 level with radiculopathy - Plan: HYDROcodone -acetaminophen  (NORCO) 5-325 MG tablet  Chronic left-sided low back pain with left-sided sciatica - Plan: HYDROcodone -acetaminophen  (NORCO) 5-325 MG tablet  Chronic pain syndrome - Plan: HYDROcodone -acetaminophen  (NORCO) 5-325 MG tablet  Patient and/or legal guardian verbally consented to  [facility] services about presenting concerns and psychiatric consultation as appropriate.  The services will be billed as appropriate for the patient  Referral to [facility].  Zoloft  increased to 150mg  daily.  Vraylar  not tolerated.  We discussed consideration for switching over to an alternative.  Would consider something like Cymbalta may be given chronic pain issues.  I renewed her Norco for as needed use since we have been having bad weather and this aggravates her chronic joint issues and back pain.  The UDS and CSA are up-to-date the national narcotic database was reviewed with no red flags  Start time: 9:18am End time: 9:32am  Total time spent on patient care (including video visit/ documentation): 16 minutes  Praveen Coia CHRISTELLA Fielding, Sydney [name] [facility] [phone]

## 2024-03-18 ENCOUNTER — Other Ambulatory Visit: Payer: Self-pay | Admitting: Family Medicine

## 2024-03-18 DIAGNOSIS — I4891 Unspecified atrial fibrillation: Secondary | ICD-10-CM

## 2024-05-23 ENCOUNTER — Encounter: Payer: Self-pay | Admitting: Radiology

## 2024-06-13 ENCOUNTER — Emergency Department (HOSPITAL_COMMUNITY)
Admission: EM | Admit: 2024-06-13 | Discharge: 2024-06-13 | Disposition: A | Discharge status: Home / Self Care | Payer: Medicare (Managed Care) | Source: Ambulatory Visit | Attending: Emergency Medicine | Admitting: Emergency Medicine

## 2024-06-13 ENCOUNTER — Emergency Department (HOSPITAL_COMMUNITY): Payer: Medicare (Managed Care)

## 2024-06-13 ENCOUNTER — Other Ambulatory Visit: Payer: Self-pay

## 2024-06-13 ENCOUNTER — Encounter (HOSPITAL_COMMUNITY): Payer: Self-pay

## 2024-06-13 ENCOUNTER — Ambulatory Visit: Payer: Self-pay | Admitting: *Deleted

## 2024-06-13 DIAGNOSIS — Z7901 Long term (current) use of anticoagulants: Secondary | ICD-10-CM | POA: Insufficient documentation

## 2024-06-13 DIAGNOSIS — I4892 Unspecified atrial flutter: Secondary | ICD-10-CM | POA: Insufficient documentation

## 2024-06-13 DIAGNOSIS — R0789 Other chest pain: Secondary | ICD-10-CM | POA: Diagnosis present

## 2024-06-13 DIAGNOSIS — I4891 Unspecified atrial fibrillation: Secondary | ICD-10-CM | POA: Diagnosis not present

## 2024-06-13 DIAGNOSIS — I10 Essential (primary) hypertension: Secondary | ICD-10-CM | POA: Diagnosis not present

## 2024-06-13 DIAGNOSIS — I2510 Atherosclerotic heart disease of native coronary artery without angina pectoris: Secondary | ICD-10-CM | POA: Diagnosis not present

## 2024-06-13 DIAGNOSIS — Z79899 Other long term (current) drug therapy: Secondary | ICD-10-CM | POA: Insufficient documentation

## 2024-06-13 LAB — CBC WITH DIFFERENTIAL/PLATELET
Abs Immature Granulocytes: 0.02 K/uL (ref 0.00–0.07)
Basophils Absolute: 0.1 K/uL (ref 0.0–0.1)
Basophils Relative: 1 %
Eosinophils Absolute: 0 K/uL (ref 0.0–0.5)
Eosinophils Relative: 0 %
HCT: 50.3 % — ABNORMAL HIGH (ref 36.0–46.0)
Hemoglobin: 16.5 g/dL — ABNORMAL HIGH (ref 12.0–15.0)
Immature Granulocytes: 0 %
Lymphocytes Relative: 14 %
Lymphs Abs: 1 K/uL (ref 0.7–4.0)
MCH: 31.3 pg (ref 26.0–34.0)
MCHC: 32.8 g/dL (ref 30.0–36.0)
MCV: 95.3 fL (ref 80.0–100.0)
Monocytes Absolute: 0.5 K/uL (ref 0.1–1.0)
Monocytes Relative: 7 %
Neutro Abs: 5.6 K/uL (ref 1.7–7.7)
Neutrophils Relative %: 78 %
Platelets: 276 K/uL (ref 150–400)
RBC: 5.28 MIL/uL — ABNORMAL HIGH (ref 3.87–5.11)
RDW: 13.7 % (ref 11.5–15.5)
WBC: 7.2 K/uL (ref 4.0–10.5)
nRBC: 0 % (ref 0.0–0.2)

## 2024-06-13 LAB — COMPREHENSIVE METABOLIC PANEL WITH GFR
ALT: 19 U/L (ref 0–44)
AST: 23 U/L (ref 15–41)
Albumin: 4.6 g/dL (ref 3.5–5.0)
Alkaline Phosphatase: 101 U/L (ref 38–126)
Anion gap: 14 (ref 5–15)
BUN: 8 mg/dL (ref 8–23)
CO2: 25 mmol/L (ref 22–32)
Calcium: 9.9 mg/dL (ref 8.9–10.3)
Chloride: 103 mmol/L (ref 98–111)
Creatinine, Ser: 0.65 mg/dL (ref 0.44–1.00)
GFR, Estimated: >60 mL/min (ref >60)
Glucose, Bld: 117 mg/dL — ABNORMAL HIGH (ref 70–99)
Potassium: 4.2 mmol/L (ref 3.5–5.1)
Sodium: 142 mmol/L (ref 135–145)
Total Bilirubin: 0.9 mg/dL (ref 0.0–1.2)
Total Protein: 7.2 g/dL (ref 6.5–8.1)

## 2024-06-13 LAB — TROPONIN T, HIGH SENSITIVITY
Troponin T High Sensitivity: <15 ng/L (ref 0–19)
Troponin T High Sensitivity: 28 ng/L — ABNORMAL HIGH (ref 0–19)
Troponin T High Sensitivity: 28 ng/L — ABNORMAL HIGH (ref 0–19)

## 2024-06-13 MED ORDER — ASPIRIN 81 MG PO CHEW
324.0000 mg | CHEWABLE_TABLET | Freq: Once | ORAL | Status: AC
  Administered 2024-06-13: 324 mg via ORAL
  Filled 2024-06-13: qty 4

## 2024-06-13 MED ORDER — DILTIAZEM LOAD VIA INFUSION
15.0000 mg | Freq: Once | INTRAVENOUS | Status: AC
  Administered 2024-06-13: 15 mg via INTRAVENOUS
  Filled 2024-06-13: qty 15

## 2024-06-13 MED ORDER — DILTIAZEM HCL-DEXTROSE 125-5 MG/125ML-% IV SOLN (PREMIX)
5.0000 mg/h | INTRAVENOUS | Status: DC
  Administered 2024-06-13: 5 mg/h via INTRAVENOUS
  Filled 2024-06-13: qty 125

## 2024-06-13 NOTE — ED Triage Notes (Signed)
 Pt arrived via POV from home c/o central chest pain that radiates to her neck. Pt reports pain began a few hours ago and reminds her of the last time she had a MI. Pt reports she takes Eliquis  as well.

## 2024-06-13 NOTE — Telephone Encounter (Signed)
 FYI Only or Action Required?: FYI only for provider: ED advised.  Patient was last seen in primary care on 02/26/2024 by Jolinda Norene HERO, DO.  Called Nurse Triage reporting Hypertension.  Symptoms began today.  Interventions attempted: Prescription medications: did forget BP medication yesterday- but has taken it today.  Symptoms are: gradually worsening.  Triage Disposition: Go to ED Now (Notify PCP)  Patient/caregiver understands and will follow disposition?: yes   Copied from CRM #[id number]. Topic: Clinical - Red Word Triage >> Jun 13, 2024  1:14 PM Montie POUR wrote: Red Word that prompted transfer to Nurse Triage:  120/103 Blood Pressure; 141 Heart Rate; Both sides of neck is hurting on and off at the area where you check your pulse. Reason for Disposition  [1] Systolic BP >= 160 OR Diastolic >= 100 AND [2] cardiac (e.g., breathing difficulty, chest pain) or neurologic symptoms (e.g., new-onset blurred or double vision, unsteady gait)  Answer Assessment - Initial Assessment Questions 1. BLOOD PRESSURE: What is your blood pressure? Did you take at least two measurements 5 minutes apart?     120/103, HR 141,   144/124, HR 139 2. ONSET: When did you take your blood pressure?     Today, 1:25 3. HOW: How did you take your blood pressure? (e.g., automatic home BP monitor, visiting nurse)     Automatic cuff- arm 4. HISTORY: Do you have a history of high blood pressure?     yes 5. MEDICINES: Are you taking any medicines for blood pressure? Have you missed any doses recently?     Yes- yesterday forgot to take- did take it today 6. OTHER SYMPTOMS: Do you have any symptoms? (e.g., blurred vision, chest pain, difficulty breathing, headache, weakness)     Pain in neck- achy, increased stress  Protocols used: Blood Pressure - High-A-AH

## 2024-06-13 NOTE — Discharge Instructions (Signed)
 Sydney [name]  Thank you for allowing us  to take care of you today.  You came to the Emergency Department today because you today you are having chest pain and fast heart rate.  Here in the emergency department we put you on a medication to help slow down your heart, and your heart slowed down, we paused your medication and watched you for several hours and your heart rate stayed normal.  You are having chest pain while your heart was going fast, however after your heart rate normalized your chest pain resolved.  Your initial heart enzyme (troponin) was normal, your second 1 was elevated, likely representing some stress on your heart from where your heart was going fast, however your third 1 was the same level as your second 1, therefore since it is not getting worse, you are not having a heart attack, however you do likely have some underlying heart disease, and you should continue to follow-up with cardiology.  Please continue to take your home medications as prescribed.  To-Do: 1. Please follow-up with your primary doctor within 1 - 2 weeks / as soon as possible.   Please return to the Emergency Department or call 911 if you experience have worsening of your symptoms, or do not get better, if you have repeat/recurrent elevated heart rate or chest pain, shortness of breath, severe or significantly worsening pain, high fever, severe confusion, pass out or have any reason to think that you need emergency medical care.   We hope you feel better soon.   Mitzie Later, MD Department of Emergency Medicine [facility] [place]

## 2024-06-13 NOTE — ED Provider Notes (Signed)
 [facility] AT [facility] Provider Note   CSN: [id number] Arrival date & time: 06/13/24  1354     History Chief Complaint  Patient presents with   Chest Pain    HPI: Sydney [name] is a 63 y.o. female with history perinent for CAD, HLD, elevated BMI,, prior NSTEMI and stenting, HTN, A-fib on Eliquis , metoprolol  who presents complaining of chest pain, tachycardia. Patient arrived via POV.  History provided by patient.  No interpreter required during this encounter.  Patient reports that she has had increased stress over the past several days due to family discord.  Reports that that yesterday she forgot her medications including her metoprolol  as well as her Eliquis .  Reports that she woke up this morning with a chest pain that radiated to her neck similar to her prior NSTEMI.  Reports that after she got up soon after 9:30 AM and started moving she also felt that her heart rate significantly increased and feels that it is irregular consistent with her prior A-fib.  Reports stress, intermittent shortness of breath ongoing chest pain.  Denies fever, chills, nausea, vomiting, diarrhea, abdominal pain, dysuria.  Patient's recorded medical, surgical, social, medication list and allergies were reviewed in the Snapshot window as part of the initial history.   Prior to Admission medications   Medication Sig Start Date End Date Taking? Authorizing Provider  albuterol  (VENTOLIN  HFA) 108 (90 Base) MCG/ACT inhaler Inhale 2 puffs into the lungs every 6 (six) hours as needed for wheezing or shortness of breath. 08/07/23   Jolinda Norene HERO, DO  amLODipine  (NORVASC ) 5 MG tablet Take 1 tablet (5 mg total) by mouth daily. 12/28/23   Jolinda Norene HERO, DO  atorvastatin  (LIPITOR) 40 MG tablet Take 1 tablet (40 mg total) by mouth daily. 06/04/23   Mallipeddi, Vishnu P, MD  azelastine  (ASTELIN ) 0.1 % nasal spray Place 1 spray into both nostrils 2 (two) times daily. 08/07/23    Jolinda Norene HERO, DO  baclofen  (LIORESAL ) 10 MG tablet Take 1 tablet (10 mg total) by mouth every 8 (eight) hours as needed for muscle spasms. 02/18/24   Gerldine Lauraine BROCKS, FNP  cetirizine (ZYRTEC) 10 MG tablet Take 10 mg by mouth daily.    [provider]  ELIQUIS  5 MG TABS tablet Take 1 tablet by mouth twice daily 03/18/24   Jolinda Norene M, DO  EPINEPHrine  (EPIPEN  2-PAK) 0.3 mg/0.3 mL IJ SOAJ injection Inject 0.3 mg into the muscle as needed for anaphylaxis. 02/20/23   Jolinda Norene HERO, DO  fluticasone (FLONASE  SENSIMIST) 27.5 MCG/SPRAY nasal spray Place 2 sprays into the nose daily. 04/29/23   [facility] Pool, NP  HYDROcodone -acetaminophen  (NORCO) 5-325 MG tablet Take 1 tablet by mouth every 6 (six) hours as needed for severe pain (pain score 7-10). 02/26/24   Jolinda Norene M, DO  hydrOXYzine  (VISTARIL ) 100 MG capsule TAKE 1 CAPSULE BY MOUTH AT BEDTIME AS NEEDED FOR REST/SLEEP. Patient not taking: Reported on 02/18/2024 01/07/24   Jolinda Norene HERO, DO  loratadine  (CLARITIN ) 10 MG tablet Take 10 mg by mouth daily.    [provider]  metoprolol  succinate (TOPROL -XL) 25 MG 24 hr tablet Take 1 tablet (25 mg total) by mouth daily. 12/28/23   Jolinda Norene HERO, DO  neomycin -polymyxin-hydrocortisone  (CORTISPORIN) OTIC solution Place 4 drops into the right ear 4 (four) times daily. 09/21/23   Gladis [name], FNP  nitroGLYCERIN  (NITROSTAT ) 0.4 MG SL tablet Place 1 tablet (0.4 mg total) under the tongue every  5 (five) minutes as needed for chest pain. Then call 911 or go to ER 02/20/23   Jolinda Potter M, DO  ranolazine  (RANEXA ) 500 MG 12 hr tablet Take 1 tablet by mouth twice daily 09/30/23   Mallipeddi, Vishnu P, MD  sertraline  (ZOLOFT ) 100 MG tablet Take 1.5 tablets (150 mg total) by mouth daily. 02/26/24   Jolinda Potter HERO, DO  solifenacin  (VESICARE ) 5 MG tablet Take 1 tablet (5 mg total) by mouth daily. 02/18/24   Gerldine Lauraine BROCKS, FNP  tamsulosin  (FLOMAX ) 0.4  MG CAPS capsule Take 1 capsule (0.4 mg total) by mouth daily. 02/17/24   Severa Rock HERO, FNP     Allergies: Bee pollen, Bee venom, Butorphanol, Meloxicam, Penicillins, Prochlorperazine edisylate, Sulfa antibiotics, Tramadol, Rosuvastatin , Clindamycin , Levaquin  [levofloxacin ], Topiramate, and Doxycycline    Review of Systems   ROS as per HPI  Physical Exam Updated Vital Signs BP 136/85   Pulse 68   Temp 98.3 F (36.8 C) (Oral)   Resp 16   Ht 5' 5.5 (1.664 m)   Wt 96.6 kg   SpO2 96%   BMI 34.90 kg/m  Physical Exam Vitals and nursing note reviewed.  Constitutional:      General: She is not in acute distress.    Appearance: Normal appearance.  HENT:     Head: Normocephalic and atraumatic.  Eyes:     Extraocular Movements: Extraocular movements intact.  Cardiovascular:     Rate and Rhythm: Tachycardia present. Rhythm irregular.     Pulses: Normal pulses.          Radial pulses are 2+ on the right side and 2+ on the left side.  Pulmonary:     Effort: Pulmonary effort is normal.  Musculoskeletal:     Right lower leg: No tenderness. No edema.     Left lower leg: No tenderness. No edema.  Skin:    General: Skin is warm and dry.     Capillary Refill: Capillary refill takes less than 2 seconds.  Neurological:     General: No focal deficit present.     Mental Status: She is alert and oriented to person, place, and time.     ED Course/ Medical Decision Making/ A&P Clinical Course as of 06/14/24 1558  Mon Jun 13, 2024  1926 Diltiazem  paused at 1605 [LS]    Clinical Course User Index [LS] Rogelia Jerilynn RAMAN, MD    Procedures .Critical Care  Performed by: Rogelia Jerilynn RAMAN, MD Authorized by: Rogelia Jerilynn RAMAN, MD   Critical care provider statement:    Critical care time (minutes):  31   Critical care was necessary to treat or prevent imminent or life-threatening deterioration of the following conditions:  Cardiac failure (A flutter RVR)   Critical care was time spent  personally by me on the following activities:  Development of treatment plan with patient or surrogate, discussions with consultants, evaluation of patient's response to treatment, examination of patient, ordering and review of laboratory studies, ordering and review of radiographic studies, ordering and performing treatments and interventions, pulse oximetry, re-evaluation of patient's condition and review of old charts    Medications Ordered in ED Medications  aspirin  chewable tablet 324 mg (324 mg Oral Given 06/13/24 1435)  diltiazem  (CARDIZEM ) 1 mg/mL load via infusion 15 mg (15 mg Intravenous Bolus from Bag 06/13/24 1440)    Medical Decision Making:   Shilah Hefel is a 63 y.o. female who presents for chest pain, tachycardia as per above.  Physical exam is pertinent  for tachycardia with a irregular rhythm  The differential includes but is not limited to ACS, arrhythmia, pericardial tamponade, pericarditis, myocarditis, pneumonia, pneumothorax, esophageal, tear, perforated abdominal viscous, pulmonary embolism, aortic dissection, costochondritis, musculoskeletal chest wall pain, GERD.  Independent historian: None  External data reviewed: No pertinent external data  Labs: Ordered, Independent interpretation, and Details: CBC without leukocytosis, mild nonspecific erythrocytosis.  No thrombocytopenia.  CMP without AKI, emergent electrolyte derangement, emergent LFT abnormality.  Initial troponin undetectable.  Delta 28, additional delta stable at 28.  Radiology: Ordered, Independent interpretation, Details: Chest x-ray without focal airspace opacification, cardiomediastinal silhouette gentian, pneumothorax, pleural effusion, bony derangement, and All images reviewed independently.  Agree with radiology report at this time.   DG Chest 2 View Result Date: 06/13/2024 EXAM: 2 VIEW(S) XRAY OF THE CHEST 06/13/2024 02:35:00 PM COMPARISON: 08/28/2023 CLINICAL HISTORY: CP. FINDINGS: LUNGS AND  PLEURA: No focal pulmonary opacity. No pleural effusion. No pneumothorax. HEART AND MEDIASTINUM: Aortic calcification. BONES AND SOFT TISSUES: Cervical spine surgical hardware noted. 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 IMPRESSION: 1. No acute cardiopulmonary process identified. 2. Aortic atherosclerosis. Electronically signed by: Luke Bun MD 06/13/2024 03:10 PM EST RP Workstation: [id number]    EKG/Medicine tests: Ordered and Independent interpretation EKG Interpretation Date/Time:  Monday June 13 2024 14:07:00 EST Ventricular Rate:  140 PR Interval:    QRS Duration:  106 QT Interval:  196 QTC Calculation: 299 R Axis:   57  Text Interpretation: Atrial flutter with variable A-V block with premature ventricular or aberrantly conducted complexes ST elevation, consider early repolarization, pericarditis, or injury Nonspecific ST and T wave abnormality Abnormal ECG When compared with ECG of 07-Sep-2023 14:45, Atrial flutter has replaced Sinus rhythm Vent. rate has increased BY  69 BPM ST elevation now present in Inferior leads ST elevation now present in Anterior leads Nonspecific T wave abnormality now evident in Anterolateral leads Confirmed by Rogelia Satterfield ([id number]) on 06/13/2024 2:17:05 PM                 Interventions: Aspirin , diltiazem   See the EMR for full details regarding lab and imaging results.  Presents with ongoing chest pain as well as tachycardia with a regular rhythm.  EKG is consistent with atrial flutter with rapid ventricular response.  Patient is a poor candidate for elective cardioversion at this time given she missed her Eliquis  yesterday, and has ongoing chest pain.  Will treat a flutter RVR via medical management with diltiazem  as well as initiate chest pain workup.  Patient did not take aspirin  prior to arrival, therefore this was administered in the ED.  Patient was given initial diltiazem  load of 15 mg had persistent tachycardia, however  after approximately 1 hour on the the infusion at 10 mg/h (total of 25 mg including initial bolus and hour of drip), patient had rate control and maintained a stable rate, therefore drip was paused at 1605, patient was chest pain free at this time.   The ECG reveals no anatomical ischemia representing STEMI, New-Onset Arrhythmia, or ischemic equivalent.  She has been risk stratified with a HEAR score of 5. Initial troponin is undetectable; delta troponin is 28.  Feel that this rising troponin is likely due to demand from patient's tachycardia, therefore additional troponin was obtained and was stable at 28, therefore not consistent with ACS given patient is not having acute rise in troponin.  The patient's presentation, the patient being hemodynamically stable, and the ECG are not consistent with Pericardial Tamponade. The patient's pain is  not positional. This in conjunction with the lack of PR depressions and ST elevations on the ECG are reassuring against Pericarditis.  The CXR is unremarkable for focal airspace disease.  The patient is afebrile and denies productive cough.  Therefore, I do not suspect Pneumonia. There is no evidence of Pneumothorax on physical exam or on the CXR. CXR shows no evidence of Esophageal Tear and there is no recent intractable emesis or esophageal instrumentation. There is no peritonitis or free air on CXR worrisome for a Perforated Abdominal Viscous.  The patient's pain is not tearing and it does not radiate to back. Pulses are present bilaterally in both the upper and lower extremities. CXR does not show a widened mediastinum. I have a very low suspicion for Aortic Dissection.  Patient was monitored for several hours in the emergency department, remained chest pain-free, and remained with rhythm control.  Given third troponin is stable in comparison to second with only mild elevation that is likely from demand from previous tachycardia, do feel that patient is stable for  discharge with close outpatient follow-up, patient amenable to this plan, discharged in stable condition.  Presentation is most consistent with acute complicated illness, Current presentation is complicated by underlying chronic conditions, and I did consider and rule out acute life/limb-threatening illness  Discussion of management or test interpretations with external provider(s): Not indicated  Risk Drugs:Prescription drug management Critical Care: 31 minutes  Disposition: DISCHARGE: I believe that the patient is safe for discharge home with outpatient follow-up. Patient was informed of all pertinent physical exam, laboratory, and imaging findings.  Patient's suspected etiology of their symptom presentation was discussed with the patient and all questions were answered. We discussed following up with PCP and outpatient cardiologist. I provided thorough ED return precautions. The patient feels safe and comfortable with this plan.  MDM generated using voice dictation software and may contain dictation errors.  Please contact me for any clarification or with any questions.  Clinical Impression:  1. Atrial flutter with rapid ventricular response [facility])      Discharge   Final Clinical Impression(s) / ED Diagnoses Final diagnoses:  Atrial flutter with rapid ventricular response [facility])    Rx / DC Orders ED Discharge Orders     None        Rogelia Jerilynn RAMAN, MD 06/14/24 1558

## 2024-06-13 NOTE — Telephone Encounter (Signed)
 FYI noted In chart patient at ED

## 2024-06-15 ENCOUNTER — Other Ambulatory Visit: Payer: Self-pay | Admitting: Family Medicine

## 2024-06-15 DIAGNOSIS — I4891 Unspecified atrial fibrillation: Secondary | ICD-10-CM

## 2024-06-28 ENCOUNTER — Encounter: Payer: Self-pay | Admitting: Family Medicine

## 2024-06-28 ENCOUNTER — Ambulatory Visit: Payer: Medicare (Managed Care) | Admitting: Family Medicine

## 2024-06-28 VITALS — BP 114/67 | HR 58 | Temp 97.9°F | Ht 63.25 in | Wt 222.0 lb

## 2024-06-28 DIAGNOSIS — F410 Panic disorder [episodic paroxysmal anxiety] without agoraphobia: Secondary | ICD-10-CM

## 2024-06-28 DIAGNOSIS — G894 Chronic pain syndrome: Secondary | ICD-10-CM | POA: Diagnosis not present

## 2024-06-28 DIAGNOSIS — G8929 Other chronic pain: Secondary | ICD-10-CM

## 2024-06-28 DIAGNOSIS — F4320 Adjustment disorder, unspecified: Secondary | ICD-10-CM | POA: Diagnosis not present

## 2024-06-28 DIAGNOSIS — M50122 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 level with radiculopathy: Secondary | ICD-10-CM | POA: Diagnosis not present

## 2024-06-28 DIAGNOSIS — Z23 Encounter for immunization: Secondary | ICD-10-CM | POA: Diagnosis not present

## 2024-06-28 DIAGNOSIS — M5442 Lumbago with sciatica, left side: Secondary | ICD-10-CM | POA: Diagnosis not present

## 2024-06-28 MED ORDER — HYDROCODONE-ACETAMINOPHEN 5-325 MG PO TABS: 1.0000 | ORAL_TABLET | Freq: Four times a day (QID) | ORAL | 0 refills | Status: AC | PRN

## 2024-06-28 MED ORDER — HYDROXYZINE PAMOATE 100 MG PO CAPS: 100.0000 mg | ORAL_CAPSULE | Freq: Two times a day (BID) | ORAL | 1 refills | Status: AC | PRN

## 2024-06-28 MED ORDER — DULOXETINE HCL 30 MG PO CPEP: ORAL_CAPSULE | ORAL | 0 refills | Status: DC

## 2024-06-28 NOTE — Patient Instructions (Signed)
 Taper instructions: Week 1: Take 1 tablet of sertraline   Week 2: Then 1/2 tablet of sertraline  x1 week. During this time start 1 capsule of Duloxetine  daily x1 week.  Week 3-6: STOP sertraline .  Take 2 capsules of Duloxetine  daily.  Appointment in 5-6 weeks to follow up new med. Ok to use hydroxyzine  if needed for anxiety attack.

## 2024-06-28 NOTE — Progress Notes (Signed)
 Subjective: CC: Follow-up pain PCP: Jolinda Norene HERO, DO YEP:Sydney [name] is a 63 y.o. female presenting to clinic today for:  Patient reports rare use of Norco.  Her 30 tablets are lasting her between 74-month appointments.  She still has some leftover.  Denies any excessive daytime sedation, falls, constipation or other side effects from the medication.  She continues to ambulate with a cane  She does report an episode of atrial flutter that occurred after some severe stress and panic.  She has an appointment with cardiology next week.  She has not had any recurrent episodes of heart palpitations.  Though stress has been fluctuant.  She has been on sertraline  150 mg for quite some time now.  Did not realize she could take the Vistaril  as needed panic attacks and has not utilized it since it was not super effective for sleep.  Would be amenable to changing her remedy   ROS: Per HPI  Allergies  Allergen Reactions   Bee Pollen Anaphylaxis and Swelling   Bee Venom Anaphylaxis and Swelling   Butorphanol Other (See Comments)    Not sure told by md she was allergic after surgery.    Meloxicam Anxiety    Mood disorder Altered her personality    Penicillins Anaphylaxis and Hives    Unable to Recall As child mom was told she is highly allergic Did it involve swelling of the face/tongue/throat, SOB, or low BP? Unknown Did it involve sudden or severe rash/hives, skin peeling, or any reaction on the inside of your mouth or nose? Unknown Did you need to seek medical attention at a hospital or doctor's office? Unknown When did it last happen?      childhood allergy If all above answers are "NO", may proceed with cephalosporin use.    Prochlorperazine Edisylate Anaphylaxis   Sulfa Antibiotics Anaphylaxis    Unable to Recall   Tramadol Anxiety    Didn't like the way it made her feel    Rosuvastatin  Other (See Comments)    Per pt, keeps her awake when she takes rosuvastatin  daily so  decreased to every other day    Clindamycin  Hives   Levaquin  [Levofloxacin ] Itching   Topiramate Nausea And Vomiting   Doxycycline  Dermatitis, Hives, Itching, Photosensitivity and Rash   Past Medical History:  Diagnosis Date   Allergy    Anemia    history of   Anxiety    Arthritis    Asthma    Atherosclerosis    Depression    Family history of adverse reaction to anesthesia    pt's mother felt everything and couldn't speak during a gallbladder surgery   GERD (gastroesophageal reflux disease)    History of hiatal hernia    History of kidney stones    Hypertension    MI (myocardial infarction) (HCC)    Pneumonia    a few times   TIA (transient ischemic attack) 2012    Current Outpatient Medications:    albuterol  (VENTOLIN  HFA) 108 (90 Base) MCG/ACT inhaler, Inhale 2 puffs into the lungs every 6 (six) hours as needed for wheezing or shortness of breath., Disp: 18 g, Rfl: 0   amLODipine  (NORVASC ) 5 MG tablet, Take 1 tablet (5 mg total) by mouth daily., Disp: 90 tablet, Rfl: 3   atorvastatin  (LIPITOR) 40 MG tablet, Take 1 tablet (40 mg total) by mouth daily., Disp: 90 tablet, Rfl: 3   DULoxetine  (CYMBALTA ) 30 MG capsule, Take 1 capsule daily (cross taper as directed) x1 week.  Then increase to 2 capsules daily thereafter, Disp: 60 capsule, Rfl: 0   ELIQUIS  5 MG TABS tablet, Take 1 tablet by mouth twice daily, Disp: 180 tablet, Rfl: 0   EPINEPHrine  (EPIPEN  2-PAK) 0.3 mg/0.3 mL IJ SOAJ injection, Inject 0.3 mg into the muscle as needed for anaphylaxis., Disp: 2 each, Rfl: 0   fluticasone (FLONASE  SENSIMIST) 27.5 MCG/SPRAY nasal spray, Place 2 sprays into the nose daily., Disp: 10 g, Rfl: 12   loratadine  (CLARITIN ) 10 MG tablet, Take 10 mg by mouth daily., Disp: , Rfl:    metoprolol  succinate (TOPROL -XL) 25 MG 24 hr tablet, Take 1 tablet (25 mg total) by mouth daily., Disp: 90 tablet, Rfl: 3   nitroGLYCERIN  (NITROSTAT ) 0.4 MG SL tablet, Place 1 tablet (0.4 mg total) under the tongue  every 5 (five) minutes as needed for chest pain. Then call 911 or go to ER, Disp: 50 tablet, Rfl: 0   ranolazine  (RANEXA ) 500 MG 12 hr tablet, Take 1 tablet by mouth twice daily, Disp: 180 tablet, Rfl: 3   solifenacin  (VESICARE ) 5 MG tablet, Take 1 tablet (5 mg total) by mouth daily., Disp: 30 tablet, Rfl: 11   azelastine  (ASTELIN ) 0.1 % nasal spray, Place 1 spray into both nostrils 2 (two) times daily. (Patient not taking: Reported on 06/28/2024), Disp: 30 mL, Rfl: 12   baclofen  (LIORESAL ) 10 MG tablet, Take 1 tablet (10 mg total) by mouth every 8 (eight) hours as needed for muscle spasms. (Patient not taking: Reported on 06/28/2024), Disp: 30 tablet, Rfl: 0   cetirizine (ZYRTEC) 10 MG tablet, Take 10 mg by mouth daily. (Patient not taking: Reported on 06/28/2024), Disp: , Rfl:    HYDROcodone -acetaminophen  (NORCO) 5-325 MG tablet, Take 1 tablet by mouth every 6 (six) hours as needed for severe pain (pain score 7-10)., Disp: 30 tablet, Rfl: 0   hydrOXYzine  (VISTARIL ) 100 MG capsule, Take 1 capsule (100 mg total) by mouth 2 (two) times daily as needed for anxiety., Disp: 30 capsule, Rfl: 1   neomycin -polymyxin-hydrocortisone  (CORTISPORIN) OTIC solution, Place 4 drops into the right ear 4 (four) times daily. (Patient not taking: Reported on 06/28/2024), Disp: 10 mL, Rfl: 0   tamsulosin  (FLOMAX ) 0.4 MG CAPS capsule, Take 1 capsule (0.4 mg total) by mouth daily. (Patient not taking: Reported on 06/28/2024), Disp: 30 capsule, Rfl: 0 Social History   Socioeconomic History   Marital status: Married    Spouse name: Not on file   Number of children: 3   Years of education: Not on file   Highest education level: Associate degree: occupational, scientist, product/process development, or vocational program  Occupational History   Not on file  Tobacco Use   Smoking status: Every Day    Current packs/day: 0.50    Average packs/day: 0.5 packs/day for 23.0 years (11.5 ttl pk-yrs)    Types: Cigarettes   Smokeless tobacco: Never  Vaping Use    Vaping status: Never Used  Substance and Sexual Activity   Alcohol use: Yes    Comment: occ   Drug use: Never   Sexual activity: Not Currently  Other Topics Concern   Not on file  Social History Narrative   [place]  [place] at home with her husband.   She has 3 children but 1 passed away from cancer.   Social Drivers of Health   Financial Resource Strain: Low Risk  (06/24/2024)   Overall Financial Resource Strain (CARDIA)    Difficulty of Paying Living Expenses: Not  hard at all  Food Insecurity: No Food Insecurity (06/24/2024)   Hunger Vital Sign    Worried About Running Out of Food in the Last Year: Never true    Ran Out of Food in the Last Year: Never true  Transportation Needs: No Transportation Needs (06/24/2024)   PRAPARE - Administrator, Civil Service (Medical): No    Lack of Transportation (Non-Medical): No  Physical Activity: Insufficiently Active (06/24/2024)   Exercise Vital Sign    Days of Exercise per Week: 2 days    Minutes of Exercise per Session: 20 min  Stress: Stress Concern Present (06/24/2024)   [employer] of Occupational Health - Occupational Stress Questionnaire    Feeling of Stress: Very much  Social Connections: Moderately Isolated (06/24/2024)   Social Connection and Isolation Panel    Frequency of Communication with Friends and Family: More than three times a week    Frequency of Social Gatherings with Friends and Family: Twice a week    Attends Religious Services: 1 to 4 times per year    Active Member of [employer] or Organizations: No    Attends Banker Meetings: Not on file    Marital Status: Widowed  Intimate Partner Violence: Not At Risk (02/02/2023)   Humiliation, Afraid, Rape, and Kick questionnaire    Fear of Current or Ex-Partner: No    Emotionally Abused: No    Physically Abused: No    Sexually Abused: No   Family History  Problem Relation Age of Onset   Anxiety  disorder Mother    Depression Mother    Heart disease Mother    Hypertension Mother    Arrhythmia Mother    Cancer Father        colon, age greater than 71   Lung cancer Father    Migraines Sister    Cancer Daughter        nerve sheath sarcoma   Alcohol abuse Brother    Breast cancer Neg Hx     Objective: Office vital signs reviewed. BP 114/67   Pulse (!) 58   Temp 97.9 F (36.6 C)   Wt 222 lb (100.7 kg)   SpO2 95%   BMI 36.38 kg/m   Physical Examination:  General: Awake, alert, well nourished, No acute distress HEENT: sclera white, MMM Cardio bradycardic with regular rhythm, S1S2 heard, no murmurs appreciated Pulm: clear to auscultation bilaterally, no wheezes, rhonchi or rales; normal work of breathing on room air Psych: Mood stable.  Eye contact fair.     01/25/2024    2:04 PM 09/21/2023   11:08 AM 08/07/2023   11:45 AM  Depression screen PHQ 2/9  Decreased Interest 0 1 2  Down, Depressed, Hopeless 0 1 2  PHQ - 2 Score 0 2 4  Altered sleeping  1 1  Tired, decreased energy  1 0  Change in appetite  1 0  Feeling bad or failure about yourself   1 0  Trouble concentrating  1 0  Moving slowly or fidgety/restless  0 0  Suicidal thoughts  0 0  PHQ-9 Score  7  5   Difficult doing work/chores  Somewhat difficult Not difficult at all     Data saved with a previous flowsheet row definition      08/07/2023   11:45 AM 02/20/2023    3:31 PM 11/03/2022    2:24 PM 09/24/2022    2:55 PM  GAD 7 : Generalized Anxiety Score  Nervous, Anxious, on  Edge 1 2 1 2   Control/stop worrying 1 2 1 2   Worry too much - different things 2 2 1 2   Trouble relaxing 2 2 1 2   Restless 1 1 0 2  Easily annoyed or irritable 1 1 1 2   Afraid - awful might happen 1 2 1 1   Total GAD 7 Score 9 12 6 13   Anxiety Difficulty Somewhat difficult Somewhat difficult Somewhat difficult Somewhat difficult   Assessment/ Plan: 63 y.o. female   Panic attack - Plan: hydrOXYzine  (VISTARIL ) 100 MG capsule,  DULoxetine  (CYMBALTA ) 30 MG capsule  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 level with radiculopathy - Plan: HYDROcodone -acetaminophen  (NORCO) 5-325 MG tablet, DULoxetine  (CYMBALTA ) 30 MG capsule  Chronic left-sided low back pain with left-sided sciatica - Plan: HYDROcodone -acetaminophen  (NORCO) 5-325 MG tablet, DULoxetine  (CYMBALTA ) 30 MG capsule  Chronic pain syndrome - Plan: HYDROcodone -acetaminophen  (NORCO) 5-325 MG tablet, DULoxetine  (CYMBALTA ) 30 MG capsule  Grief reaction - Plan: hydrOXYzine  (VISTARIL ) 100 MG capsule, DULoxetine  (CYMBALTA ) 30 MG capsule   We discussed that Vistaril  could be used as needed panic attack.  Caution sedation.  I am going to have her start tapering off of the SSRI and advised her to reduce to 100 mg for 1 week and then 50 mg for 1 week.  When she gets down to 50 mg that she may cross taper with low-dose Cymbalta  at 30 mg.  After 1 week, discontinue sertraline  totally and may increase Cymbalta  to 60 mg daily thereafter.  I would like to see her back in about 4 weeks and this was set up prior to her departure today.  Hopefully will see improvement in both mental health and chronic pain syndrome with this new drug.  In the meantime, Norco renewed.  She is up-to-date on UDS and CSC and the national narcotic database was reviewed with no red flags  Both pneumonia and flu vaccines were administered today  Norene CHRISTELLA Fielding, DO [facility] [phone]

## 2024-07-01 ENCOUNTER — Ambulatory Visit: Payer: Medicare (Managed Care) | Attending: Nurse Practitioner | Admitting: Nurse Practitioner

## 2024-07-01 ENCOUNTER — Encounter: Payer: Self-pay | Admitting: Nurse Practitioner

## 2024-07-01 VITALS — BP 142/82 | HR 62 | Ht 63.75 in | Wt 223.8 lb

## 2024-07-01 DIAGNOSIS — F439 Reaction to severe stress, unspecified: Secondary | ICD-10-CM | POA: Diagnosis not present

## 2024-07-01 DIAGNOSIS — I4892 Unspecified atrial flutter: Secondary | ICD-10-CM | POA: Diagnosis not present

## 2024-07-01 DIAGNOSIS — I2510 Atherosclerotic heart disease of native coronary artery without angina pectoris: Secondary | ICD-10-CM | POA: Diagnosis not present

## 2024-07-01 DIAGNOSIS — Z79899 Other long term (current) drug therapy: Secondary | ICD-10-CM | POA: Diagnosis not present

## 2024-07-01 DIAGNOSIS — E785 Hyperlipidemia, unspecified: Secondary | ICD-10-CM

## 2024-07-01 DIAGNOSIS — I10 Essential (primary) hypertension: Secondary | ICD-10-CM

## 2024-07-01 NOTE — Progress Notes (Unsigned)
 Cardiology Office Note:  .   Date:  07/01/2024 ID:  Sydney [name], DOB 1960/11/30, MRN [id number] PCP: Jolinda Norene HERO, DO  [facility] Providers Cardiologist:  Diannah SHAUNNA Maywood, MD    History of Present Illness: .   Sydney [name] is a 63 y.o. female with a PMH of A-flutter with RVR, CAD, hypertension, hyperlipidemia, history of TIA in 2012, GERD, and anxiety, who presents today for regular follow-up and ER follow-up.   Previous cardiovascular history of NSTEMI in July 2024.  Received PCI of left circumflex.  Recommended to continue DAPT x 1 year  Last seen by Dr. Mallipeddi on June 04, 2023.  At that time, patient reported having chronic chest pains, typically triggered by stress.  Then, she was undergoing significant stress.  Noted intermittent shortness of breath that was not bothersome.  Was started on low-dose Ranexa .  Echocardiogram was arranged and was benign.  ED visit on 08/28/2023 for A-fib, however initial EKG revealed A-flutter with 2:1 A-V conduction. Presented with CP and tachycardia. Had been feeling bad for the past several days, was after she found her husband passed away and was found dead in bed. Was under stress at the time. She was tx with Cardizem  and Potassium. Did convert back to NSR.   09/07/2023 - Today she presents for regular follow-up with her granddaughter.  She states she is overall doing well. Denies any recent palpitations or tachycardia but does admit to sensation of heart pounding. Denies any abnormal heart rhythm. Denies any chest pain, shortness of breath, palpitations, syncope, presyncope, dizziness, orthopnea, PND, swelling or significant weight changes, acute bleeding, or claudication. Says she is scheduled for future sleep study soon.   10/23/2023 - Presents today for follow-up. Overall doing well. Wonders if her previous episodes of palpitations were due to stress. Admits to stress after husband suddenly passed away. Does admit to easy  bruising/bleeding. Had a random episode of bleeding from right ear, did eventually resolve. Denies any recurrences. Denies any chest pain, shortness of breath, palpitations, syncope, presyncope, dizziness, orthopnea, PND, swelling or significant weight changes, or claudication.  ED visit 06/13/2024 presented with CC of chest pain, tachycardia. Pt reported increased stress at the time, had forgotten to take Metoprolol  as well as Eliquis . She had chest pain and soon after felt tachycardia. Also noted intermittent SHOB. CXR negative. EKG showed A-flutter with RVR, HR 140 bpm. Tx medically with diltiazem , trop low and flat, d/c home.   Today she is here for scheduled follow-up and ER follow-up. She states she is doing better. Admits to stress that led to episode. Denies any chest pain, shortness of breath, palpitations, syncope, presyncope, dizziness, orthopnea, PND, swelling or significant weight changes, acute bleeding, or claudication.  ROS: Negative. See HPI.  SH: Coping well after her husband's passing. 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   Studies Reviewed: SABRA    EKG: EKG is not ordered today.   Cardiac monitor 09/2023:    Patch wear time was for 13 days and 20 hours.   Normal sinus rhythm predominantly ranging from 45 to 120 bpm with an average HR of 67 bpm.   4 runs of nonsustained SVT occurred, fastest interval lasting 4 beats and the longest interval lasting 5 beats.   No evidence of ventricular arrhythmias, high-grade AV block or pauses.   <1% PAC burden and <1% PVC burden.   Patient triggered events correlated with NSR, 53 to 71 bpm.  Echo 06/2023:    1. Left ventricular ejection fraction, by estimation,  is 60 to 65%. The  left ventricle has normal function. The left ventricle has no regional  wall motion abnormalities. Left ventricular diastolic parameters were  normal.   2. Right ventricular systolic function is normal. The right ventricular  size is normal. There is mildly elevated  pulmonary artery systolic  pressure. The estimated right ventricular systolic pressure is 36.3 mmHg.   3. The mitral valve is normal in structure. No evidence of mitral valve  regurgitation. No evidence of mitral stenosis.   4. The aortic valve is tricuspid. Aortic valve regurgitation is not  visualized. No aortic stenosis is present.   5. The inferior vena cava is dilated in size with >50% respiratory  variability, suggesting right atrial pressure of 8 mmHg.   Comparison(s): No prior Echocardiogram.  LHC 01/2023:    CULPRIT LESION SEGMENT: Mid Cx to Dist Cx lesion is 60% stenosed.  Dist Cx lesion is 95% stenosed.   A drug-eluting stent was successfully placed covering both lesions, using a SYNERGY XD [id number] -> deployed to 2.45 mm with stent balloon.  Post intervention, there is a 0% residual stenosis for both lesions.TIMI-3 flow pre and post   -----------------------------------------------------------------   Post intervention, there is a 0% residual stenosis.   There is hyperdynamic left ventricular systolic function.  The left ventricular ejection fraction is greater than 65% by visual estimate.   LV end diastolic pressure is normal.   There is no aortic valve stenosis.   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 TIMI-3 flow pre and post. Otherwise large caliber codominant RCA with large PDA and large RV marginal branch with minimal disease, and very large caliber LAD with equally large major diagonal branch also with minimal disease in the LAD. Hyperdynamic LV with EF > 65%, no RWMA. LVEDP 7 mmHg.  Patient has chronic chest discomfort which was not similar to the symptoms noted when she had pretty classic anginal pain in the jaw and throat/neck with PTCA and stent placement.  Chronic pain is a  2/10, anginal pain is a 0/10.     RECOMMENDATIONS In the absence of any other complications or medical issues, we expect the patient to be ready for discharge from an interventional cardiology perspective on 02/04/2023. Recommend uninterrupted dual antiplatelet therapy with Aspirin  81mg  daily and Ticagrelor  90mg  twice daily for a minimum of 12 months (ACS-Class I recommendation). Given the difficulty of stenting this vessel, would recommend at least another year of antiplatelet agent monotherapy with either maintenance dose Brilinta  60 mg twice daily or Plavix  75 mg daily that can be interrupted.   Physical Exam:   VS:  BP 127/70   Pulse 62   Ht 5' 3.75 (1.619 m)   Wt 223 lb 12.8 oz (101.5 kg)   SpO2 98%   BMI 38.72 kg/m    Wt Readings from Last 3 Encounters:  07/01/24 223 lb 12.8 oz (101.5 kg)  06/28/24 222 lb (100.7 kg)  06/13/24 212 lb 15.4 oz (96.6 kg)    GEN: Obese, 63 y.o. female in no acute distress NECK: No JVD; No carotid bruits CARDIAC: S1/S2, RRR, no murmurs, rubs, gallops RESPIRATORY:  Clear to auscultation without rales, wheezing or rhonchi  ABDOMEN: Soft, non-tender, non-distended EXTREMITIES:  No edema; No deformity, does have some scattered/generalized bruising to bilateral forearms   ASSESSMENT  AND PLAN: .    Paroxysmal A-flutter Denies any recent palpitations or tachycardia since hospitalization. Tolerating current medications well. HR well controlled and continue Eliquis  5 mg BID. Discussed avoiding triggers to A-flutter.   CAD Underwent LHC in July 2024 d/t NSTEMI - see results above. Received PCI of left circumflex.  Stable with no anginal symptoms. No indication for ischemic evaluation.  Continue current medication regimen. Will obtain FLP. Heart healthy diet and regular cardiovascular exercise encouraged. Care and ED precautions discussed.   HTN BP stable on recheck. Discussed to monitor BP at home at least 2 hours after medications and sitting for 5-10  minutes. Continue current medication regimen. Discussed when to notify our office. Heart healthy diet and regular cardiovascular exercise encouraged.   HLD, medication management Due for labs - will obtain FLP. No medication changes at this time. Heart healthy diet and regular cardiovascular exercise encouraged.   5.  Stress Continue to follow with PCP with further evaluation and management.    Dispo: Follow-up with Dr. Mallipeddi or APP in 6 months or sooner if anything changes.   Signed, Almarie Crate, NP

## 2024-07-01 NOTE — Patient Instructions (Addendum)
 Medication Instructions:   Continue all current medications.   Labwork:  FLP - order given today Reminder:  Nothing to eat or drink after 12 midnight prior to labs. Please do in 1-2 weeks Office will contact with results via phone, letter or mychart.     Testing/Procedures:  none  Follow-Up:  6 months   Any Other Special Instructions Will Be Listed Below (If Applicable).   If you need a refill on your cardiac medications before your next appointment, please call your pharmacy.

## 2024-07-04 ENCOUNTER — Ambulatory Visit: Payer: Medicare (Managed Care) | Admitting: Internal Medicine

## 2024-07-08 ENCOUNTER — Ambulatory Visit: Payer: Medicare (Managed Care) | Admitting: Internal Medicine

## 2024-07-18 ENCOUNTER — Other Ambulatory Visit: Payer: Self-pay | Admitting: Internal Medicine

## 2024-07-20 ENCOUNTER — Encounter: Payer: Self-pay | Admitting: Family Medicine

## 2024-07-20 ENCOUNTER — Ambulatory Visit
Admission: EM | Admit: 2024-07-20 | Discharge: 2024-07-20 | Disposition: A | Discharge status: Home / Self Care | Payer: Medicare (Managed Care) | Attending: Family Medicine | Admitting: Family Medicine

## 2024-07-20 DIAGNOSIS — R220 Localized swelling, mass and lump, head: Secondary | ICD-10-CM

## 2024-07-20 DIAGNOSIS — K0889 Other specified disorders of teeth and supporting structures: Secondary | ICD-10-CM

## 2024-07-20 MED ORDER — AZITHROMYCIN 250 MG PO TABS: ORAL_TABLET | ORAL | 0 refills | Status: DC

## 2024-07-20 NOTE — Discharge Instructions (Signed)
 We will try a course of antibiotics for a possible dental infection to be causing your facial swelling.  You may apply cool compresses to the face, take over-the-counter pain relievers as needed.  Go to the emergency department for worsening or unresolving symptoms, particularly any neurologic symptoms such as speech or swallowing changes, loss of facial movement on 1 side, severe headaches, confusion, weakness numbness or tingling of your arm or leg

## 2024-07-20 NOTE — ED Triage Notes (Signed)
 Pt reports right side facial swelling, x 3 days, pain is present in the upper teeth has been using Anbesol gel to help. Now has drooping on the left side is concerned.

## 2024-07-21 NOTE — ED Provider Notes (Signed)
" [facility]    CSN: [id number] Arrival date & time: 07/20/24  1241      History   Chief Complaint No chief complaint on file.   HPI Sydney [name] is a 64 y.o. female.   Patient presenting today with progressively worsening right sided facial swelling, worsening right upper dental pain for the past 3 days now.  She has been using some numbing medication over-the-counter with mild temporary benefit of the dental pain but notes the swelling worsened this morning and she now feels like the right side of her face is drooping.  She notes a history of TIA so she was concerned and wanting to have a further evaluation today.  Denies numbness, tingling, weakness, dizziness, visual change, speech or swallowing changes, mental status changes, injury to the area.  So far trying over-the-counter remedies with minimal relief.    Past Medical History:  Diagnosis Date   Allergy    Anemia    history of   Anxiety    Arthritis    Asthma    Atherosclerosis    Depression    Family history of adverse reaction to anesthesia    pt's mother felt everything and couldn't speak during a gallbladder surgery   GERD (gastroesophageal reflux disease)    History of hiatal hernia    History of kidney stones    Hypertension    MI (myocardial infarction) (HCC)    Pneumonia    a few times   TIA (transient ischemic attack) 2012    Patient Active Problem List   Diagnosis Date Noted   Osteoarthritis of left middle finger 01/25/2024   Acute bursitis of left shoulder 01/25/2024   CAD (coronary artery disease) 06/04/2023   HLD (hyperlipidemia) 06/04/2023   Long term (current) use of antithrombotics/antiplatelets 06/04/2023   S/P coronary angioplasty 06/04/2023   Nasal congestion with rhinorrhea 04/29/2023   Body aches 04/29/2023   NSTEMI (non-ST elevated myocardial infarction) (HCC) 02/02/2023   Nicotine  abuse 02/02/2023   Weakness of left quadriceps muscle 11/06/2022   Facet degeneration  of lumbar region 02/13/2022   Peripheral vascular disease with stasis dermatitis 09/03/2021   Urine frequency 05/10/2021   History of bilateral hip arthroplasty 01/03/2021   Trochanteric bursitis, left hip 09/13/2020   Closed fracture of multiple pubic rami, left, sequela 08/16/2020   H/O adenomatous polyp of colon 05/23/2020   FH: colon cancer in relative diagnosed at >55 years old 05/23/2020   Impingement syndrome of right shoulder 05/03/2020   H/O total hip arthroplasty 12/06/2019   DDD (degenerative disc disease), lumbosacral 07/01/2018   Venous insufficiency of both lower extremities 11/17/2016   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 level with radiculopathy 08/18/2016   Chronic pain syndrome 10/24/2015   Dysfunction of both eustachian tubes 07/26/2014   Impaired fasting glucose 04/05/2014   History of bladder surgery 04/03/2014   History of hysterectomy 04/03/2014   Urge incontinence 04/03/2014   Asthma 07/28/2013   Nontoxic uninodular goiter 07/28/2013   Solitary pulmonary nodule 07/28/2013   Lumbar radiculopathy 04/20/2013   Insomnia 03/22/2013   Essential hypertension 08/18/2011   Moderate episode of recurrent major depressive disorder (HCC) 04/03/2011   Other B-complex deficiencies 08/16/2010   Morbid obesity due to excess calories (HCC) 05/17/2010   Family history of cerebrovascular accident (CVA) 04/17/2010   Family history of malignant neoplasm of gastrointestinal tract 04/17/2010   Family history of other cardiovascular diseases(V17.49) 04/17/2010    Past Surgical History:  Procedure Laterality Date   ABDOMINAL HYSTERECTOMY  abdominal   APPENDECTOMY     bladder tack     CARPAL TUNNEL RELEASE Bilateral    CERVICAL SPINE SURGERY     5-6, 6-7   CHOLECYSTECTOMY     COLONOSCOPY WITH PROPOFOL  N/A 07/10/2020   Procedure: COLONOSCOPY WITH PROPOFOL ;  Surgeon: Cindie Carlin POUR, DO;  Location: [facility];  Service: Endoscopy;  Laterality: N/A;  12:15pm   CORONARY  STENT INTERVENTION N/A 02/03/2023   Procedure: CORONARY STENT INTERVENTION;  Surgeon: Anner Alm ORN, MD;  Location: [facility] INVASIVE CV LAB;  Service: Cardiovascular;  Laterality: N/A;   ENDOVENOUS ABLATION SAPHENOUS VEIN W/ LASER Left 11/28/2021   endovenous laser ablation left greater saphenous vein by Gaile New MD   HAMMER TOE SURGERY Right    JOINT REPLACEMENT     LEFT HEART CATH AND CORONARY ANGIOGRAPHY N/A 02/03/2023   Procedure: LEFT HEART CATH AND CORONARY ANGIOGRAPHY;  Surgeon: Anner Alm ORN, MD;  Location: [facility] INVASIVE CV LAB;  Service: Cardiovascular;  Laterality: N/A;   TOTAL HIP 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   Procedure: LEFT TOTAL HIP ARTHROPLASTY -DIRECT ANTERIOR;  Surgeon: Barbarann Oneil BROCKS, MD;  Location: [facility] OR;  Service: Orthopedics;  Laterality: Left;   TOTAL HIP ARTHROPLASTY Right 12/05/2019   Procedure: RIGHT TOTAL HIP ARTHROPLASTY ANTERIOR APPROACH  DIRECT ANTERIOR;  Surgeon: Barbarann Oneil BROCKS, MD;  Location: [facility] OR;  Service: Orthopedics;  Laterality: Right;    OB History   No obstetric history on file.      Home Medications    Prior to Admission medications  Medication Sig Start Date End Date Taking? Authorizing Provider  azithromycin  (ZITHROMAX ) 250 MG tablet Take first 2 tablets together, then 1 every day until finished. 07/20/24  Yes Stuart Vernell Norris, PA-C  albuterol  (VENTOLIN  HFA) 108 [phone] Base) MCG/ACT inhaler Inhale 2 puffs into the lungs every 6 (six) hours as needed for wheezing or shortness of breath. 08/07/23   Jolinda Norene HERO, DO  amLODipine  (NORVASC ) 5 MG tablet Take 1 tablet (5 mg total) by mouth daily. 12/28/23   Jolinda Norene HERO, DO  atorvastatin  (LIPITOR) 40 MG tablet Take 1 tablet by mouth once daily 07/19/24   Mallipeddi, Vishnu P, MD  DULoxetine  (CYMBALTA ) 30 MG capsule Take 1 capsule daily (cross taper as directed) x1 week.  Then increase to 2 capsules daily thereafter 06/28/24   Jolinda Norene M, DO  ELIQUIS  5 MG TABS tablet Take 1 tablet  by mouth twice daily 06/15/24   Jolinda Norene M, DO  EPINEPHrine  (EPIPEN  2-PAK) 0.3 mg/0.3 mL IJ SOAJ injection Inject 0.3 mg into the muscle as needed for anaphylaxis. 02/20/23   Jolinda Norene HERO, DO  fluticasone (FLONASE  SENSIMIST) 27.5 MCG/SPRAY nasal spray Place 2 sprays into the nose daily. 04/29/23   St Morton Sebastian Pool, NP  HYDROcodone -acetaminophen  (NORCO) 5-325 MG tablet Take 1 tablet by mouth every 6 (six) hours as needed for severe pain (pain score 7-10). 06/28/24   Jolinda Norene HERO, DO  hydrOXYzine  (VISTARIL ) 100 MG capsule Take 1 capsule (100 mg total) by mouth 2 (two) times daily as needed for anxiety. 06/28/24   Jolinda Norene HERO, DO  loratadine  (CLARITIN ) 10 MG tablet Take 10 mg by mouth daily.    [provider]  metoprolol  succinate (TOPROL -XL) 25 MG 24 hr tablet Take 1 tablet (25 mg total) by mouth daily. 12/28/23   Jolinda Norene HERO, DO  nitroGLYCERIN  (NITROSTAT ) 0.4 MG SL tablet Place 1 tablet (0.4 mg total) under the tongue every 5 (five) minutes as  needed for chest pain. Then call 911 or go to ER 02/20/23   Jolinda Potter M, DO  ranolazine  (RANEXA ) 500 MG 12 hr tablet Take 1 tablet by mouth twice daily 09/30/23   Mallipeddi, Vishnu P, MD  solifenacin  (VESICARE ) 5 MG tablet Take 1 tablet (5 mg total) by mouth daily. 02/18/24   Gerldine Lauraine BROCKS, FNP    Family History Family History  Problem Relation Age of Onset   Anxiety disorder Mother    Depression Mother    Heart disease Mother    Hypertension Mother    Arrhythmia Mother    Cancer Father        colon, age greater than 33   Lung cancer Father    Migraines Sister    Cancer Daughter        nerve sheath sarcoma   Alcohol abuse Brother    Breast cancer Neg Hx     Social History Social History[1]   Allergies   Bee pollen, Bee venom, Butorphanol, Meloxicam, Penicillins, Prochlorperazine edisylate, Sulfa antibiotics, Tramadol, Rosuvastatin , Clindamycin , Levaquin  [levofloxacin ], Topiramate, and  Doxycycline    Review of Systems Review of Systems PER HPI  Physical Exam Triage Vital Signs ED Triage Vitals  Encounter Vitals Group     BP 07/20/24 1242 120/73     Girls Systolic BP Percentile --      Girls Diastolic BP Percentile --      Boys Systolic BP Percentile --      Boys Diastolic BP Percentile --      Pulse Rate 07/20/24 1242 65     Resp 07/20/24 1242 20     Temp 07/20/24 1242 98.3 F (36.8 C)     Temp Source 07/20/24 1242 Oral     SpO2 07/20/24 1242 93 %     Weight --      Height --      Head Circumference --      Peak Flow --      Pain Score 07/20/24 1245 0     Pain Loc --      Pain Education --      Exclude from Growth Chart --    No data found.  Updated Vital Signs BP 120/73 (BP Location: Right Arm)   Pulse 65   Temp 98.3 F (36.8 C) (Oral)   Resp 20   SpO2 93%   Visual Acuity Right Eye Distance:   Left Eye Distance:   Bilateral Distance:    Right Eye Near:   Left Eye Near:    Bilateral Near:     Physical Exam Vitals and nursing note reviewed.  Constitutional:      Appearance: Normal appearance. She is not ill-appearing.  HENT:     Head: Atraumatic.     Comments: Right sided facial swelling at the cheek    Nose: Nose normal.     Mouth/Throat:     Mouth: Mucous membranes are moist.     Comments: No appreciable abscesses in the oral cavity, gingival erythema and edema to the right upper molar region Eyes:     Extraocular Movements: Extraocular movements intact.     Conjunctiva/sclera: Conjunctivae normal.  Cardiovascular:     Rate and Rhythm: Normal rate.  Pulmonary:     Effort: Pulmonary effort is normal. No respiratory distress.  Musculoskeletal:        General: Normal range of motion.     Cervical back: Normal range of motion and neck supple.  Skin:    General: Skin  is warm and dry.  Neurological:     General: No focal deficit present.     Mental Status: She is alert and oriented to person, place, and time. Mental status is at  baseline.     Cranial Nerves: No cranial nerve deficit.     Motor: No weakness.     Gait: Gait normal.  Psychiatric:        Mood and Affect: Mood normal.        Thought Content: Thought content normal.        Judgment: Judgment normal.      UC Treatments / Results  Labs (all labs ordered are listed, but only abnormal results are displayed) Labs Reviewed - No data to display  EKG   Radiology No results found.  Procedures Procedures (including critical care time)  Medications Ordered in UC Medications - No data to display  Initial Impression / Assessment and Plan / UC Course  I have reviewed the triage vital signs and the nursing notes.  Pertinent labs & imaging results that were available during my care of the patient were reviewed by me and considered in my medical decision making (see chart for details).     Vitals and exam reassuring today, no focal neurologic deficits or concerning symptoms.  Did discuss with patient given her concern for possible TIA/CVA that we are unable to rule this out as we do not have imaging resources for this and that she would need to go to the emergency department for further evaluation to that issue however exam very reassuring today with symmetric movement of the face and extremities.  Suspect the swelling to the right side of face causing the slight downward pull that she is noticing on the right side.  Possible dental infection causing the swelling, will treat with Zithromax  as she has numerous antibiotic allergies and this is 1 that she knows she can tolerate and discussed supportive over-the-counter medications, home care, close dental follow-up.  Follow-up with PCP for recheck of all symptoms.  ED for worsening symptoms at any time.  Final Clinical Impressions(s) / UC Diagnoses   Final diagnoses:  Pain, dental  Facial swelling     Discharge Instructions      We will try a course of antibiotics for a possible dental infection to be  causing your facial swelling.  You may apply cool compresses to the face, take over-the-counter pain relievers as needed.  Go to the emergency department for worsening or unresolving symptoms, particularly any neurologic symptoms such as speech or swallowing changes, loss of facial movement on 1 side, severe headaches, confusion, weakness numbness or tingling of your arm or leg    ED Prescriptions     Medication Sig Dispense Auth. Provider   azithromycin  (ZITHROMAX ) 250 MG tablet Take first 2 tablets together, then 1 every day until finished. 6 tablet Stuart Vernell Norris, [place]      PDMP not reviewed this encounter.    [1]  Social History Tobacco Use   Smoking status: Every Day    Current packs/day: 0.50    Average packs/day: 0.5 packs/day for 23.0 years (11.5 ttl pk-yrs)    Types: Cigarettes   Smokeless tobacco: Never   Tobacco comments:    States she is not inhaling them.   Vaping Use   Vaping status: Never Used  Substance Use Topics   Alcohol use: Yes    Comment: occ   Drug use: Never     Stuart Vernell Norris, PA-C 07/21/24  0837 ° °"

## 2024-07-22 ENCOUNTER — Emergency Department (HOSPITAL_COMMUNITY): Payer: Medicare (Managed Care)

## 2024-07-22 ENCOUNTER — Encounter (HOSPITAL_COMMUNITY): Payer: Self-pay | Admitting: *Deleted

## 2024-07-22 ENCOUNTER — Other Ambulatory Visit: Payer: Self-pay

## 2024-07-22 ENCOUNTER — Emergency Department (HOSPITAL_COMMUNITY)
Admission: EM | Admit: 2024-07-22 | Discharge: 2024-07-22 | Disposition: A | Discharge status: Home / Self Care | Payer: Medicare (Managed Care) | Source: Home / Self Care | Attending: Emergency Medicine | Admitting: Emergency Medicine

## 2024-07-22 ENCOUNTER — Encounter: Payer: Self-pay | Admitting: Family Medicine

## 2024-07-22 DIAGNOSIS — I2510 Atherosclerotic heart disease of native coronary artery without angina pectoris: Secondary | ICD-10-CM | POA: Insufficient documentation

## 2024-07-22 DIAGNOSIS — K0889 Other specified disorders of teeth and supporting structures: Secondary | ICD-10-CM | POA: Insufficient documentation

## 2024-07-22 DIAGNOSIS — L03211 Cellulitis of face: Secondary | ICD-10-CM | POA: Insufficient documentation

## 2024-07-22 LAB — CBC WITH DIFFERENTIAL/PLATELET
Abs Immature Granulocytes: 0.01 K/uL (ref 0.00–0.07)
Basophils Absolute: 0 K/uL (ref 0.0–0.1)
Basophils Relative: 1 %
Eosinophils Absolute: 0.1 K/uL (ref 0.0–0.5)
Eosinophils Relative: 3 %
HCT: 46.7 % — ABNORMAL HIGH (ref 36.0–46.0)
Hemoglobin: 15.1 g/dL — ABNORMAL HIGH (ref 12.0–15.0)
Immature Granulocytes: 0 %
Lymphocytes Relative: 18 %
Lymphs Abs: 0.9 K/uL (ref 0.7–4.0)
MCH: 31.5 pg (ref 26.0–34.0)
MCHC: 32.3 g/dL (ref 30.0–36.0)
MCV: 97.3 fL (ref 80.0–100.0)
Monocytes Absolute: 0.3 K/uL (ref 0.1–1.0)
Monocytes Relative: 7 %
Neutro Abs: 3.7 K/uL (ref 1.7–7.7)
Neutrophils Relative %: 71 %
Platelets: 244 K/uL (ref 150–400)
RBC: 4.8 MIL/uL (ref 3.87–5.11)
RDW: 13.3 % (ref 11.5–15.5)
WBC: 5.2 K/uL (ref 4.0–10.5)
nRBC: 0 % (ref 0.0–0.2)

## 2024-07-22 LAB — BASIC METABOLIC PANEL WITH GFR
Anion gap: 11 (ref 5–15)
BUN: 10 mg/dL (ref 8–23)
CO2: 23 mmol/L (ref 22–32)
Calcium: 9 mg/dL (ref 8.9–10.3)
Chloride: 104 mmol/L (ref 98–111)
Creatinine, Ser: 0.6 mg/dL (ref 0.44–1.00)
GFR, Estimated: >60 mL/min
Glucose, Bld: 100 mg/dL — ABNORMAL HIGH (ref 70–99)
Potassium: 4.6 mmol/L (ref 3.5–5.1)
Sodium: 138 mmol/L (ref 135–145)

## 2024-07-22 LAB — COMPREHENSIVE METABOLIC PANEL WITH GFR
ALT: 14 U/L (ref 0–44)
AST: 21 U/L (ref 15–41)
Albumin: 4.1 g/dL (ref 3.5–5.0)
Alkaline Phosphatase: 106 U/L (ref 38–126)
Anion gap: 14 (ref 5–15)
BUN: 10 mg/dL (ref 8–23)
CO2: 23 mmol/L (ref 22–32)
Calcium: 9.2 mg/dL (ref 8.9–10.3)
Chloride: 104 mmol/L (ref 98–111)
Creatinine, Ser: 0.56 mg/dL (ref 0.44–1.00)
GFR, Estimated: >60 mL/min
Glucose, Bld: 92 mg/dL (ref 70–99)
Potassium: 6.9 mmol/L (ref 3.5–5.1)
Sodium: 140 mmol/L (ref 135–145)
Total Bilirubin: 0.9 mg/dL (ref 0.0–1.2)
Total Protein: 6.7 g/dL (ref 6.5–8.1)

## 2024-07-22 MED ORDER — CEPHALEXIN 500 MG PO CAPS: 500.0000 mg | ORAL_CAPSULE | Freq: Two times a day (BID) | ORAL | 0 refills | Status: AC

## 2024-07-22 MED ORDER — IOHEXOL 300 MG/ML  SOLN
75.0000 mL | Freq: Once | INTRAMUSCULAR | Status: AC | PRN
  Administered 2024-07-22: 75 mL via INTRAVENOUS

## 2024-07-22 MED ORDER — METHYLPREDNISOLONE SODIUM SUCC 125 MG IJ SOLR
125.0000 mg | Freq: Once | INTRAMUSCULAR | Status: AC
  Administered 2024-07-22: 125 mg via INTRAVENOUS
  Filled 2024-07-22: qty 2

## 2024-07-22 NOTE — Discharge Instructions (Signed)
 Begin taking Keflex  as prescribed.  May continue to take hydrocodone  as needed for pain.  Accordingly warm salt water can help keep mouth clean.  Please call dentist Monday to schedule follow-up for evaluation of tooth.  Return to ED if any symptoms worsen including worsening swelling of the face, new fevers, severe swelling in the mouth, difficulty breathing.

## 2024-07-22 NOTE — ED Triage Notes (Signed)
 Pt seen at [facility] for dental pain and started on antibiotic, swelling to on right side for days per pt. , states pain radiates toward her eye.

## 2024-07-22 NOTE — ED Provider Notes (Signed)
 "  EMERGENCY DEPARTMENT AT [facility] Provider Note   CSN: [id number] Arrival date & time: 07/22/24  1257     Patient presents with: Dental Pain   Sydney [name] is a 64 y.o. female.  Patient is a 64 year old female with a history of CAD, hyperlipidemia, chronic pain who presents to the ED for increasing dental pain and facial swelling for the past 5 days.  Patient notes that the right upper side of her teeth are hurting and feels like her face is swollen.  She states she went to her urgent care 3 days ago and was placed on azithromycin  but pain and swelling have continued.  She notes she has not seen a dentist recently but does have the name of 1 to call.  She notes they are not open until Monday.  She states she has hydrocodone  for chronic back pain and has been taking that for pain that does help but is more concerned about the swelling.  Denies fevers or difficulty breathing.  States she does have a broken tooth in the area.  No further complaints.    Dental Pain Associated symptoms: facial swelling   Associated symptoms: no fever        Prior to Admission medications  Medication Sig Start Date End Date Taking? Authorizing Provider  cephALEXin  (KEFLEX ) 500 MG capsule Take 1 capsule (500 mg total) by mouth 2 (two) times daily for 7 days. 07/22/24 07/29/24 Yes Efrem Pitstick, Thersia RAMAN, PA-C  albuterol  (VENTOLIN  HFA) 108 (90 Base) MCG/ACT inhaler Inhale 2 puffs into the lungs every 6 (six) hours as needed for wheezing or shortness of breath. 08/07/23   Jolinda Norene HERO, DO  amLODipine  (NORVASC ) 5 MG tablet Take 1 tablet (5 mg total) by mouth daily. 12/28/23   Jolinda Norene HERO, DO  atorvastatin  (LIPITOR) 40 MG tablet Take 1 tablet by mouth once daily 07/19/24   Mallipeddi, Vishnu P, MD  azithromycin  (ZITHROMAX ) 250 MG tablet Take first 2 tablets together, then 1 every day until finished. 07/20/24   Stuart Vernell Norris, PA-C  DULoxetine  (CYMBALTA ) 30 MG capsule Take 1 capsule  daily (cross taper as directed) x1 week.  Then increase to 2 capsules daily thereafter 06/28/24   Jolinda Norene M, DO  ELIQUIS  5 MG TABS tablet Take 1 tablet by mouth twice daily 06/15/24   Jolinda Norene M, DO  EPINEPHrine  (EPIPEN  2-PAK) 0.3 mg/0.3 mL IJ SOAJ injection Inject 0.3 mg into the muscle as needed for anaphylaxis. 02/20/23   Jolinda Norene HERO, DO  fluticasone (FLONASE  SENSIMIST) 27.5 MCG/SPRAY nasal spray Place 2 sprays into the nose daily. 04/29/23   St Morton Sebastian Pool, NP  HYDROcodone -acetaminophen  (NORCO) 5-325 MG tablet Take 1 tablet by mouth every 6 (six) hours as needed for severe pain (pain score 7-10). 06/28/24   Jolinda Norene HERO, DO  hydrOXYzine  (VISTARIL ) 100 MG capsule Take 1 capsule (100 mg total) by mouth 2 (two) times daily as needed for anxiety. 06/28/24   Jolinda Norene HERO, DO  loratadine  (CLARITIN ) 10 MG tablet Take 10 mg by mouth daily.    [provider]  metoprolol  succinate (TOPROL -XL) 25 MG 24 hr tablet Take 1 tablet (25 mg total) by mouth daily. 12/28/23   Jolinda Norene HERO, DO  nitroGLYCERIN  (NITROSTAT ) 0.4 MG SL tablet Place 1 tablet (0.4 mg total) under the tongue every 5 (five) minutes as needed for chest pain. Then call 911 or go to ER 02/20/23   Jolinda Norene M, DO  ranolazine  (RANEXA )  500 MG 12 hr tablet Take 1 tablet by mouth twice daily 09/30/23   Mallipeddi, Vishnu P, MD  solifenacin  (VESICARE ) 5 MG tablet Take 1 tablet (5 mg total) by mouth daily. 02/18/24   Gerldine Lauraine BROCKS, FNP    Allergies: Bee pollen, Bee venom, Butorphanol, Meloxicam, Penicillins, Prochlorperazine edisylate, Sulfa antibiotics, Tramadol, Rosuvastatin , Clindamycin , Levaquin  [levofloxacin ], Topiramate, and Doxycycline     Review of Systems  Constitutional:  Negative for chills and fever.  HENT:  Positive for dental problem and facial swelling.   Respiratory:  Negative for shortness of breath.   Cardiovascular:  Negative for chest pain.  All other systems  reviewed and are negative.   Updated Vital Signs BP 126/83   Pulse (!) 57   Temp 98.2 F (36.8 C) (Oral)   Resp 16   Ht 5' 3.5 (1.613 m)   Wt 99.8 kg   SpO2 96%   BMI 38.36 kg/m   Physical Exam Constitutional:      Appearance: Normal appearance.  HENT:     Head: Normocephalic and atraumatic.     Nose: Nose normal.     Mouth/Throat:     Mouth: Mucous membranes are moist.     Comments: Tender to palpation on the right upper gingiva over the first incisor.  No obvious signs of gingival abscess.  Maxillary tenderness on the right side with very mild edema.  Posterior oropharynx clear with no respiratory distress. Cardiovascular:     Rate and Rhythm: Normal rate.  Pulmonary:     Effort: Pulmonary effort is normal.  Neurological:     Mental Status: She is alert and oriented to person, place, and time.  Psychiatric:        Mood and Affect: Mood normal.        Behavior: Behavior normal.     (all labs ordered are listed, but only abnormal results are displayed) Labs Reviewed  CBC WITH DIFFERENTIAL/PLATELET - Abnormal; Notable for the following components:      Result Value   Hemoglobin 15.1 (*)    HCT 46.7 (*)    All other components within normal limits  COMPREHENSIVE METABOLIC PANEL WITH GFR - Abnormal; Notable for the following components:   Potassium 6.9 (*)    All other components within normal limits  BASIC METABOLIC PANEL WITH GFR - Abnormal; Notable for the following components:   Glucose, Bld 100 (*)    All other components within normal limits    EKG: None  Radiology: CT Maxillofacial W Contrast Result Date: 07/22/2024 EXAM: CT Face with contrast 07/22/2024 04:23:16 PM TECHNIQUE: CT of the face was performed with the administration of 75 mL of iohexol  (OMNIPAQUE ) 300 MG/ML solution. Multiplanar reformatted images are provided for review. Automated exposure control, iterative reconstruction, and/or weight based adjustment of the mA/kV was utilized to reduce the  radiation dose to as low as reasonably achievable. COMPARISON: None available CLINICAL HISTORY: maxillary pain/edema, potential dental infection FINDINGS: AERODIGESTIVE TRACT: No mass. No edema. SALIVARY GLANDS: No acute abnormality. LYMPH NODES: No suspicious cervical lymphadenopathy. SOFT TISSUES: Right-sided skin thickening and subcutaneous edematous changes are noted overlying the right mandible and extending superiorly. No discrete abscess is present. Slight thickening of the right platysma is noted. BRAIN, ORBITS AND SINUSES: No acute abnormality. BONES: Multiple dental caries are present. No periapical abscess or focal odontogenic source of inflammation is present. Facet hypertrophy contributes to left foraminal narrowing at C3-C4 and right foraminal narrowing at C4-C5. No acute abnormality. No suspicious bone lesion. VASCULATURE: Atherosclerotic  calcifications are present at the carotid bifurcations bilaterally without significant stenosis. IMPRESSION: 1. Right-sided skin thickening and subcutaneous edematous changes overlying the right mandible and extending superiorly, without discrete abscess. Findings most consistent with a nonspecific cellulitis. 2. Multiple dental caries without abscess or focal odontogenic source of inflammation. Electronically signed by: Lonni Necessary MD 07/22/2024 04:31 PM EST RP Workstation: [id number]      Medications Ordered in the ED  methylPREDNISolone  sodium succinate (SOLU-MEDROL ) 125 mg/2 mL injection 125 mg (125 mg Intravenous Given 07/22/24 1423)  iohexol  (OMNIPAQUE ) 300 MG/ML solution 75 mL (75 mLs Intravenous Contrast Given 07/22/24 1608)                                   Medical Decision Making Patient is a 64 year old female who presents to the ED for right upper dental pain and overlying facial swelling for the past 4 to 5 days.  Please see detailed HPI above.  On exam patient is alert and in no acute distress.  Physical exam as noted above.  No  obvious gingival abscess appreciated.  Mild overlying maxillary edema but no acute respiratory distress.  Vital signs stable including patient is afebrile.  Initial CMP revealed hyperkalemia of 6.8,, concerns that this is hemolyzed.  EKG obtained that shows sinus rhythm with no acute abnormalities.  BMP was repeated that shows stable hemoglobin within normal limits.  Otherwise labs normal including no leukocytosis.  CT of the maxillofacial area was obtained that shows right sided subcu edema over the right mandible and extending superiorly but no discrete abscess.  There is most likely cellulitis and multiple caries appreciated but no obvious fluid collections.    Suspect patient's symptoms are secondary to dental infection.  No trismus or submandibular edema appreciated to suggest Ludwig angina or deep neck infection.  She was initially placed on azithromycin  that unfortunately will have poor coverage for dental infection.  As patient is otherwise well-appearing, she is stable for discharge home.  She was given Solu-Medrol  while in ED for edema.  Will prescribe Keflex  as she was noted to tolerate this in 2023.  Symptomatic care discussed.  Advised to call dentist on Monday to schedule follow-up appointment for further evaluation and management.  Return precautions provided.  Amount and/or Complexity of Data Reviewed Labs: ordered. Radiology: ordered.  Risk Prescription drug management.       Final diagnoses:  Pain, dental  Facial cellulitis    ED Discharge Orders          Ordered    cephALEXin  (KEFLEX ) 500 MG capsule  2 times daily        07/22/24 1707               Neysa Thersia GORMAN DEVONNA 07/22/24 1739    Cleotilde Rogue, MD 07/23/24 [phone]  "

## 2024-08-02 ENCOUNTER — Other Ambulatory Visit: Payer: Self-pay | Admitting: Family Medicine

## 2024-08-02 DIAGNOSIS — F4320 Adjustment disorder, unspecified: Secondary | ICD-10-CM

## 2024-08-02 DIAGNOSIS — G894 Chronic pain syndrome: Secondary | ICD-10-CM

## 2024-08-02 DIAGNOSIS — F410 Panic disorder [episodic paroxysmal anxiety] without agoraphobia: Secondary | ICD-10-CM

## 2024-08-02 DIAGNOSIS — G8929 Other chronic pain: Secondary | ICD-10-CM

## 2024-08-02 DIAGNOSIS — M50122 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 level with radiculopathy: Secondary | ICD-10-CM

## 2024-08-09 ENCOUNTER — Other Ambulatory Visit (HOSPITAL_COMMUNITY)
Admission: RE | Admit: 2024-08-09 | Discharge: 2024-08-09 | Disposition: A | Discharge status: Home / Self Care | Payer: Medicare (Managed Care) | Source: Ambulatory Visit | Attending: Nurse Practitioner | Admitting: Nurse Practitioner

## 2024-08-09 DIAGNOSIS — I2510 Atherosclerotic heart disease of native coronary artery without angina pectoris: Secondary | ICD-10-CM | POA: Diagnosis present

## 2024-08-09 DIAGNOSIS — Z79899 Other long term (current) drug therapy: Secondary | ICD-10-CM | POA: Insufficient documentation

## 2024-08-09 DIAGNOSIS — E785 Hyperlipidemia, unspecified: Secondary | ICD-10-CM | POA: Diagnosis present

## 2024-08-09 LAB — LIPID PANEL
Cholesterol: 133 mg/dL (ref 0–200)
HDL: 53 mg/dL
LDL Cholesterol: 67 mg/dL (ref 0–99)
Total CHOL/HDL Ratio: 2.5 ratio
Triglycerides: 61 mg/dL
VLDL: 12 mg/dL (ref 0–40)

## 2024-08-12 ENCOUNTER — Ambulatory Visit: Payer: Medicare (Managed Care) | Admitting: Family Medicine

## 2024-08-12 ENCOUNTER — Encounter: Payer: Self-pay | Admitting: Family Medicine

## 2024-08-12 VITALS — BP 127/80 | HR 59 | Temp 98.1°F | Ht 63.5 in | Wt 223.1 lb

## 2024-08-12 DIAGNOSIS — Z6379 Other stressful life events affecting family and household: Secondary | ICD-10-CM

## 2024-08-12 DIAGNOSIS — F4320 Adjustment disorder, unspecified: Secondary | ICD-10-CM | POA: Diagnosis not present

## 2024-08-12 DIAGNOSIS — M5442 Lumbago with sciatica, left side: Secondary | ICD-10-CM

## 2024-08-12 DIAGNOSIS — M50122 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 level with radiculopathy: Secondary | ICD-10-CM | POA: Diagnosis not present

## 2024-08-12 DIAGNOSIS — F410 Panic disorder [episodic paroxysmal anxiety] without agoraphobia: Secondary | ICD-10-CM

## 2024-08-12 DIAGNOSIS — G894 Chronic pain syndrome: Secondary | ICD-10-CM | POA: Diagnosis not present

## 2024-08-12 DIAGNOSIS — G8929 Other chronic pain: Secondary | ICD-10-CM

## 2024-08-12 MED ORDER — DULOXETINE HCL 60 MG PO CPEP: 60.0000 mg | ORAL_CAPSULE | Freq: Every day | ORAL | 3 refills | Status: AC
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
## 2024-08-15 ENCOUNTER — Telehealth: Payer: Self-pay | Admitting: Family Medicine

## 2024-08-15 NOTE — Telephone Encounter (Signed)
 Called to change appt on 08/16/24 to virtual per Redell Corn   If patient returns call and is unable to do a virtual visit, please make appt for next Tuesday

## 2024-08-16 ENCOUNTER — Ambulatory Visit: Payer: Self-pay | Admitting: Professional Counselor

## 2024-08-16 DIAGNOSIS — F4320 Adjustment disorder, unspecified: Secondary | ICD-10-CM

## 2024-08-19 NOTE — Patient Instructions (Signed)
 If your symptoms worsen or you have thoughts of suicide/homicide, PLEASE SEEK IMMEDIATE MEDICAL ATTENTION.  You may always call:   National Suicide Hotline: 988 or 539 667 3577 [place] Crisis Line: 458 569 7915 Crisis Recovery in [place]: [phone]     These are available 24 hours a day, 7 days a week.

## 2024-08-22 ENCOUNTER — Ambulatory Visit: Payer: Self-pay | Admitting: Nurse Practitioner

## 2024-08-22 ENCOUNTER — Ambulatory Visit: Payer: Medicare (Managed Care) | Admitting: Urology

## 2024-08-23 ENCOUNTER — Ambulatory Visit: Payer: Medicare (Managed Care) | Admitting: Professional Counselor

## 2024-08-23 DIAGNOSIS — F4320 Adjustment disorder, unspecified: Secondary | ICD-10-CM

## 2024-08-23 NOTE — BH Specialist Note (Unsigned)
 [place] Follow-up  MRN: [id number] NAME: Sydney [name] Date: 08/23/24  Start time: Start Time: 0330 End time: Stop Time: 0400 Total time: Total Time in Minutes (Visit): 30 Call number: Visit Number: 2- Second Visit  Reason for call today:  had a rough couple of weeks it was the anniverary of husbands death. Son struggling. Cymbalta  helping, needing long term therapy   PHQ-9 Scores:     08/23/2024    3:41 PM 08/12/2024   12:10 PM 06/28/2024    3:46 PM 01/25/2024    2:04 PM 09/21/2023   11:08 AM  Depression screen PHQ 2/9  Decreased Interest 2 2 1  0 1  Down, Depressed, Hopeless 2 2 1  0 1  PHQ - 2 Score 4 4 2  0 2  Altered sleeping 2 2 3  1   Tired, decreased energy 2 2 1  1   Change in appetite 2 2 2  1   Feeling bad or failure about yourself  1 1 1  1   Trouble concentrating 1 2 1  1   Moving slowly or fidgety/restless 0 1 0  0  Suicidal thoughts 0 0 0  0  PHQ-9 Score 12 14 10  7    Difficult doing work/chores Somewhat difficult Somewhat difficult Somewhat difficult  Somewhat difficult     Data saved with a previous flowsheet row definition   GAD-7 Scores:     08/23/2024    3:42 PM 08/12/2024   12:11 PM 06/28/2024    3:46 PM 08/07/2023   11:45 AM  GAD 7 : Generalized Anxiety Score  Nervous, Anxious, on Edge 2 2 2  1    Control/stop worrying 2 2 2  1    Worry too much - different things 2 2 2  2    Trouble relaxing 2 2 2  2    Restless 2 2 2  1    Easily annoyed or irritable 1 1 1  1    Afraid - awful might happen 3 2 3  1    Total GAD 7 Score 14 13 14 9   Anxiety Difficulty Somewhat difficult Somewhat difficult Somewhat difficult Somewhat difficult     Data saved with a previous flowsheet row definition    Stress Current stressors:  grief, helping raise her granddaughter Sleep: Trouble falling asleep   Appetite:  good Coping ability:  fair Patient taking medications as prescribed:  yes  Current medications:  Outpatient Encounter Medications as of 08/23/2024  Medication Sig    albuterol  (VENTOLIN  HFA) 108 (90 Base) MCG/ACT inhaler Inhale 2 puffs into the lungs every 6 (six) hours as needed for wheezing or shortness of breath.   amLODipine  (NORVASC ) 5 MG tablet Take 1 tablet (5 mg total) by mouth daily.   atorvastatin  (LIPITOR) 40 MG tablet Take 1 tablet by mouth once daily   DULoxetine  (CYMBALTA ) 60 MG capsule Take 1 capsule (60 mg total) by mouth daily.   ELIQUIS  5 MG TABS tablet Take 1 tablet by mouth twice daily   EPINEPHrine  (EPIPEN  2-PAK) 0.3 mg/0.3 mL IJ SOAJ injection Inject 0.3 mg into the muscle as needed for anaphylaxis.   HYDROcodone -acetaminophen  (NORCO) 5-325 MG tablet Take 1 tablet by mouth every 6 (six) hours as needed for severe pain (pain score 7-10).   hydrOXYzine  (VISTARIL ) 100 MG capsule Take 1 capsule (100 mg total) by mouth 2 (two) times daily as needed for anxiety.   loratadine  (CLARITIN ) 10 MG tablet Take 10 mg by mouth daily.   metoprolol  succinate (TOPROL -XL) 25 MG 24 hr tablet Take 1  tablet (25 mg total) by mouth daily.   nitroGLYCERIN  (NITROSTAT ) 0.4 MG SL tablet Place 1 tablet (0.4 mg total) under the tongue every 5 (five) minutes as needed for chest pain. Then call 911 or go to ER   ranolazine  (RANEXA ) 500 MG 12 hr tablet Take 1 tablet by mouth twice daily   solifenacin  (VESICARE ) 5 MG tablet Take 1 tablet (5 mg total) by mouth daily.   No facility-administered encounter medications on file as of 08/23/2024.     Self-harm Behaviors Risk Assessment Self-harm risk factors:   Patient endorses recent thoughts of harming self:    [place] Suicide Severity Rating Scale: Failed to redirect to the Timeline version of the REVFS SmartLink.   Danger to Others Risk Assessment Danger to others risk factors:   Patient endorses recent thoughts of harming others:    Dynamic Appraisal of Situational Aggression (DASA):      No data to display           Substance Use Assessment Patient recently consumed alcohol:    Alcohol Use Disorder  Identification Test (AUDIT):     10/30/2022    1:54 PM 08/03/2023    5:13 PM 01/21/2024   11:22 AM 06/24/2024   11:42 AM  Alcohol Use Disorder Test (AUDIT)  1. How often do you have a drink containing alcohol? 1  1  1  1    2. How many drinks containing alcohol do you have on a typical day when you are drinking? 0  0  0  0   3. How often do you have six or more drinks on one occasion? 0  0  0  0   AUDIT-C Score 1 1  1  1       Manually entered by patient   Patient recently used drugs:    Opioid Risk Assessment:    Goals, Interventions and Follow-up Plan Goals: {IBH Goals:21014053} Interventions: {IBH Interventions:21014054} Follow-up Plan: {Virtual BH Follow up Recommendations:21014064}  Summary: ***  Redell JINNY Corn

## 2024-08-24 ENCOUNTER — Ambulatory Visit: Payer: Medicare (Managed Care) | Admitting: Urology

## 2024-08-24 VITALS — BP 123/79 | HR 73

## 2024-08-24 DIAGNOSIS — N3281 Overactive bladder: Secondary | ICD-10-CM

## 2024-08-24 DIAGNOSIS — N3941 Urge incontinence: Secondary | ICD-10-CM

## 2024-08-24 LAB — URINALYSIS, ROUTINE W REFLEX MICROSCOPIC
Bilirubin, UA: NEGATIVE
Glucose, UA: NEGATIVE
Ketones, UA: NEGATIVE
Nitrite, UA: NEGATIVE
Protein,UA: NEGATIVE
Specific Gravity, UA: 1.02 (ref 1.005–1.030)
Urobilinogen, Ur: 0.2 mg/dL (ref 0.2–1.0)
pH, UA: 6 (ref 5.0–7.5)

## 2024-08-24 LAB — MICROSCOPIC EXAMINATION

## 2024-08-24 LAB — BLADDER SCAN AMB NON-IMAGING: Scan Result: 0

## 2024-08-24 MED ORDER — TOLTERODINE TARTRATE ER 2 MG PO CP24: 2.0000 mg | ORAL_CAPSULE | Freq: Every day | ORAL | 11 refills | Status: AC

## 2024-08-24 NOTE — Progress Notes (Unsigned)
 "  08/24/2024 10:40 AM   Sydney [name] 1960/11/27 [id number]  Referring provider: Jolinda Norene HERO, DO [address] [place],  [place] [address]  Followup OAB   HPI: [name] is a 64yo here for followup for OAb and urge incontinence. She stopped the vesicare  since last visit due to difficulty emptying her bladder. She has unaware urinary incontinence. She is using 2-3 pads per day. She has urinary frequency every 1-2 hours off of vesicare . Her urinary frequency was 4-5 hours on vesicare . She denies any SUI. She has previously tried gemtesa  and mirabegron  without success.    PMH: Past Medical History:  Diagnosis Date   Allergy    Anemia    history of   Anxiety    Arthritis    Asthma    Atherosclerosis    Depression    Family history of adverse reaction to anesthesia    pt's mother felt everything and couldn't speak during a gallbladder surgery   GERD (gastroesophageal reflux disease)    History of hiatal hernia    History of kidney stones    Hypertension    MI (myocardial infarction) (HCC)    Pneumonia    a few times   TIA (transient ischemic attack) 2012    Surgical History: Past Surgical History:  Procedure Laterality Date   ABDOMINAL HYSTERECTOMY     abdominal   APPENDECTOMY     bladder tack     CARPAL TUNNEL RELEASE Bilateral    CERVICAL SPINE SURGERY     5-6, 6-7   CHOLECYSTECTOMY     COLONOSCOPY WITH PROPOFOL  N/A 07/10/2020   Procedure: COLONOSCOPY WITH PROPOFOL ;  Surgeon: Cindie Carlin POUR, DO;  Location: [facility];  Service: Endoscopy;  Laterality: N/A;  12:15pm   CORONARY STENT INTERVENTION N/A 02/03/2023   Procedure: CORONARY STENT INTERVENTION;  Surgeon: Anner Alm ORN, MD;  Location: [facility] INVASIVE CV LAB;  Service: Cardiovascular;  Laterality: N/A;   ENDOVENOUS ABLATION SAPHENOUS VEIN W/ LASER Left 11/28/2021   endovenous laser ablation left greater saphenous vein by Gaile New MD   HAMMER TOE SURGERY Right    JOINT REPLACEMENT     LEFT  HEART CATH AND CORONARY ANGIOGRAPHY N/A 02/03/2023   Procedure: LEFT HEART CATH AND CORONARY ANGIOGRAPHY;  Surgeon: Anner Alm ORN, MD;  Location: [facility] INVASIVE CV LAB;  Service: Cardiovascular;  Laterality: N/A;   TOTAL HIP 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   Procedure: LEFT TOTAL HIP ARTHROPLASTY -DIRECT ANTERIOR;  Surgeon: Barbarann Oneil BROCKS, MD;  Location: [facility] OR;  Service: Orthopedics;  Laterality: Left;   TOTAL HIP ARTHROPLASTY Right 12/05/2019   Procedure: RIGHT TOTAL HIP ARTHROPLASTY ANTERIOR APPROACH  DIRECT ANTERIOR;  Surgeon: Barbarann Oneil BROCKS, MD;  Location: [facility] OR;  Service: Orthopedics;  Laterality: Right;    Home Medications:  Allergies as of 08/24/2024       Reactions   Bee Pollen Anaphylaxis, Swelling   Bee Venom Anaphylaxis, Swelling   Butorphanol Other (See Comments)   Not sure told by md she was allergic after surgery.   Meloxicam Anxiety   Mood disorder Altered her personality   Penicillins Anaphylaxis, Hives   Unable to Recall As child mom was told she is highly allergic Did it involve swelling of the face/tongue/throat, SOB, or low BP? Unknown Did it involve sudden or severe rash/hives, skin peeling, or any reaction on the inside of your mouth or nose? Unknown Did you need to seek medical attention at a hospital or doctor's office? Unknown When did it  last happen?      childhood allergy If all above answers are NO, may proceed with cephalosporin use.   Prochlorperazine Edisylate Anaphylaxis   Sulfa Antibiotics Anaphylaxis   Unable to Recall   Tramadol Anxiety   Didn't like the way it made her feel   Rosuvastatin  Other (See Comments)   Per pt, keeps her awake when she takes rosuvastatin  daily so decreased to every other day    Clindamycin  Hives   Levaquin  [levofloxacin ] Itching   Topiramate Nausea And Vomiting   Doxycycline  Dermatitis, Hives, Itching, Photosensitivity, Rash        Medication List        Accurate as of August 24, 2024 10:40 AM. If you have any  questions, ask your nurse or doctor.          albuterol  108 (90 Base) MCG/ACT inhaler Commonly known as: VENTOLIN  HFA Inhale 2 puffs into the lungs every 6 (six) hours as needed for wheezing or shortness of breath.   amLODipine  5 MG tablet Commonly known as: NORVASC  Take 1 tablet (5 mg total) by mouth daily.   atorvastatin  40 MG tablet Commonly known as: LIPITOR Take 1 tablet by mouth once daily   Claritin  10 MG tablet Generic drug: loratadine  Take 10 mg by mouth daily.   DULoxetine  60 MG capsule Commonly known as: CYMBALTA  Take 1 capsule (60 mg total) by mouth daily.   Eliquis  5 MG Tabs tablet Generic drug: apixaban  Take 1 tablet by mouth twice daily   EPINEPHrine  0.3 mg/0.3 mL Soaj injection Commonly known as: EpiPen  2-Pak Inject 0.3 mg into the muscle as needed for anaphylaxis.   HYDROcodone -acetaminophen  5-325 MG tablet Commonly known as: Norco Take 1 tablet by mouth every 6 (six) hours as needed for severe pain (pain score 7-10).   hydrOXYzine  100 MG capsule Commonly known as: VISTARIL  Take 1 capsule (100 mg total) by mouth 2 (two) times daily as needed for anxiety.   metoprolol  succinate 25 MG 24 hr tablet Commonly known as: TOPROL -XL Take 1 tablet (25 mg total) by mouth daily.   nitroGLYCERIN  0.4 MG SL tablet Commonly known as: NITROSTAT  Place 1 tablet (0.4 mg total) under the tongue every 5 (five) minutes as needed for chest pain. Then call 911 or go to ER   ranolazine  500 MG 12 hr tablet Commonly known as: RANEXA  Take 1 tablet by mouth twice daily   solifenacin  5 MG tablet Commonly known as: VESICARE  Take 1 tablet (5 mg total) by mouth daily.        Allergies: Allergies[1]  Family History: Family History  Problem Relation Age of Onset   Anxiety disorder Mother    Depression Mother    Heart disease Mother    Hypertension Mother    Arrhythmia Mother    Cancer Father        colon, age greater than 20   Lung cancer Father    Migraines  Sister    Cancer Daughter        nerve sheath sarcoma   Alcohol abuse Brother    Breast cancer Neg Hx     Social History:  reports that she has been smoking cigarettes. She has a 11.5 pack-year smoking history. She has never used smokeless tobacco. She reports current alcohol use. She reports that she does not use drugs.  ROS: All other review of systems were reviewed and are negative except what is noted above in HPI  Physical Exam: BP 123/79   Pulse 73  Constitutional:  Alert and oriented, No acute distress. HEENT: [place] AT, moist mucus membranes.  Trachea midline, no masses. Cardiovascular: No clubbing, cyanosis, or edema. Respiratory: Normal respiratory effort, no increased work of breathing. GI: Abdomen is soft, nontender, nondistended, no abdominal masses GU: No CVA tenderness.  Lymph: No cervical or inguinal lymphadenopathy. Skin: No rashes, bruises or suspicious lesions. Neurologic: Grossly intact, no focal deficits, moving all 4 extremities. Psychiatric: Normal mood and affect.  Laboratory Data: Lab Results  Component Value Date   WBC 5.2 07/22/2024   HGB 15.1 (H) 07/22/2024   HCT 46.7 (H) 07/22/2024   MCV 97.3 07/22/2024   PLT 244 07/22/2024    Lab Results  Component Value Date   CREATININE 0.60 07/22/2024    No results found for: PSA  No results found for: TESTOSTERONE  Lab Results  Component Value Date   HGBA1C 5.3 11/03/2022    Urinalysis    Component Value Date/Time   COLORURINE STRAW (A) 12/02/2019 1121   APPEARANCEUR Clear 02/18/2024 1013   LABSPEC 1.005 12/02/2019 1121   PHURINE 6.0 12/02/2019 1121   GLUCOSEU Negative 02/18/2024 1013   HGBUR SMALL (A) 12/02/2019 1121   BILIRUBINUR Negative 02/18/2024 1013   KETONESUR NEGATIVE 12/02/2019 1121   PROTEINUR Negative 02/18/2024 1013   PROTEINUR NEGATIVE 12/02/2019 1121   NITRITE Negative 02/18/2024 1013   NITRITE NEGATIVE 12/02/2019 1121   LEUKOCYTESUR Negative 02/18/2024 1013    LEUKOCYTESUR NEGATIVE 12/02/2019 1121    Lab Results  Component Value Date   LABMICR See below: 02/18/2024   WBCUA 0-5 02/18/2024   LABEPIT >10 (H) 02/18/2024   MUCUS Present 06/12/2021   BACTERIA Few (A) 02/18/2024    Pertinent Imaging: *** Results for orders placed during the hospital encounter of 02/18/24  DG Abd 1 View  Narrative CLINICAL DATA:  Kidney stones.  EXAM: ABDOMEN - 1 VIEW  COMPARISON:  Radiograph 06/29/2023  FINDINGS: Again seen 5 mm calcification projecting over the lower left kidney. No visualized right-sided renal calculi. No evidence of ureteral or bladder stone. Cholecystectomy clips in the right upper quadrant. Normal bowel gas pattern, small volume of formed stool in the colon. Bilateral hip arthroplasties.  IMPRESSION: Unchanged 5 mm left renal calculus.   Electronically Signed By: Andrea Gasman M.D. On: 02/21/2024 15:55  No results found for this or any previous visit.  No results found for this or any previous visit.  No results found for this or any previous visit.  Results for orders placed during the hospital encounter of 06/29/23  US  RENAL  Narrative : PROCEDURE: US  RENAL  HISTORY: Patient is a 64 y/o F with kidney stone known or suspected.  COMPARISON: XR abdomen 06/07/2019, CT renal stone protocol 04/19/2019.  TECHNIQUE: Two-dimensional grayscale and color Doppler ultrasound of the kidneys was performed.  FINDINGS: The urinary bladder demonstrates normal anechoic echogenicity. The bilateral ureteral jets are not visualized.  The right kidney measures 12.0 cm. Renal cortical echotexture is normal. There is no hydronephrosis. There are no stones. There are no cysts.  The left kidney measures 12.1 cm. Renal cortical echotexture is normal. There is no hydronephrosis. There are no stones. There are no cysts.  IMPRESSION: 1.  Unremarkable examination.  Thank you for allowing us  to assist in the care of this  patient.   Electronically Signed By: Lynwood Mains M.D. On: 06/30/2023 06:07  No results found for this or any previous visit.  No results found for this or any previous visit.  Results for orders  placed during the hospital encounter of 04/19/19  CT Renal Stone Study  Narrative CLINICAL DATA:  64 year old female with increasing abdominal and flank pain today. Known urinary calculi.  EXAM: CT ABDOMEN AND PELVIS WITHOUT CONTRAST  TECHNIQUE: Multidetector CT imaging of the abdomen and pelvis was performed following the standard protocol without IV contrast.  COMPARISON:  None.  FINDINGS: Please note that parenchymal abnormalities may be missed without intravenous contrast.  Lower chest: No acute abnormality.  Hepatobiliary: The liver is unremarkable. Patient is status post cholecystectomy. No biliary dilatation.  Pancreas: Unremarkable  Spleen: Unremarkable  Adrenals/Urinary Tract: A 4 mm proximal LEFT ureteral calculus causes mild LEFT hydronephrosis. At least 3 non obstructing LEFT renal calculi are identified measuring 3-5 mm.  The RIGHT kidney, adrenal glands and bladder are unremarkable.  Stomach/Bowel: Stomach is within normal limits. No evidence of bowel wall thickening, distention, or inflammatory changes.  Vascular/Lymphatic: Aortic atherosclerosis. No enlarged abdominal or pelvic lymph nodes.  Reproductive: Status post hysterectomy. No adnexal masses.  Other: No ascites, pneumoperitoneum or focal collection.  Musculoskeletal: No acute or suspicious bony abnormalities. Multilevel degenerative disc disease, spondylosis and facet arthropathy noted within the lumbar spine.  IMPRESSION: 1. 4 mm proximal LEFT ureteral calculus causing mild LEFT hydronephrosis. 2. LEFT nephrolithiasis 3.  Aortic Atherosclerosis (ICD10-I70.0).   Electronically Signed By: Reyes Phi M.D. On: 04/19/2019 20:58   Assessment & Plan:    1. Urge incontinence  (Primary) We will trial detrol  LA 2mg  daily - Urinalysis, Routine w reflex microscopic - BLADDER SCAN AMB NON-IMAGING  2. OAB (overactive bladder) Detrol  2mg  daily   No follow-ups on file.  Belvie Clara, MD  [facility]      [1]  Allergies Allergen Reactions   Bee Pollen Anaphylaxis and Swelling   Bee Venom Anaphylaxis and Swelling   Butorphanol Other (See Comments)    Not sure told by md she was allergic after surgery.    Meloxicam Anxiety    Mood disorder Altered her personality    Penicillins Anaphylaxis and Hives    Unable to Recall As child mom was told she is highly allergic Did it involve swelling of the face/tongue/throat, SOB, or low BP? Unknown Did it involve sudden or severe rash/hives, skin peeling, or any reaction on the inside of your mouth or nose? Unknown Did you need to seek medical attention at a hospital or doctor's office? Unknown When did it last happen?      childhood allergy If all above answers are NO, may proceed with cephalosporin use.    Prochlorperazine Edisylate Anaphylaxis   Sulfa Antibiotics Anaphylaxis    Unable to Recall   Tramadol Anxiety    Didn't like the way it made her feel    Rosuvastatin  Other (See Comments)    Per pt, keeps her awake when she takes rosuvastatin  daily so decreased to every other day    Clindamycin  Hives   Levaquin  [Levofloxacin ] Itching   Topiramate Nausea And Vomiting   Doxycycline  Dermatitis, Hives, Itching, Photosensitivity and Rash   "

## 2024-08-24 NOTE — Progress Notes (Unsigned)
   Patient can void prior to the bladder scan. Bladder scan result: 0  Performed By: [facility] LPN

## 2024-08-24 NOTE — Patient Instructions (Signed)

## 2024-08-26 ENCOUNTER — Telehealth: Payer: Self-pay | Admitting: Professional Counselor

## 2024-08-26 DIAGNOSIS — F4320 Adjustment disorder, unspecified: Secondary | ICD-10-CM
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
## 2024-09-05 ENCOUNTER — Ambulatory Visit: Payer: Medicare (Managed Care) | Admitting: Professional Counselor

## 2024-10-07 ENCOUNTER — Ambulatory Visit: Payer: Medicare (Managed Care) | Admitting: Family Medicine

## 2025-03-29 ENCOUNTER — Ambulatory Visit: Payer: Medicare (Managed Care) | Admitting: Urology
